# Patient Record
Sex: Male | Born: 1970 | Race: Black or African American | Hispanic: No | Marital: Single | Smoking: Current every day smoker
Health system: Southern US, Community
[De-identification: ages and names within clinical notes are randomized; demographics above are authoritative.]

## PROBLEM LIST (undated history)

## (undated) DIAGNOSIS — T8781 Dehiscence of amputation stump: Secondary | ICD-10-CM

## (undated) DIAGNOSIS — Z87820 Personal history of traumatic brain injury: Secondary | ICD-10-CM

## (undated) DIAGNOSIS — T8130XA Disruption of wound, unspecified, initial encounter: Secondary | ICD-10-CM

## (undated) DIAGNOSIS — Z9289 Personal history of other medical treatment: Secondary | ICD-10-CM

## (undated) DIAGNOSIS — S14105A Unspecified injury at C5 level of cervical spinal cord, initial encounter: Secondary | ICD-10-CM

## (undated) DIAGNOSIS — R569 Unspecified convulsions: Secondary | ICD-10-CM

## (undated) DIAGNOSIS — K592 Neurogenic bowel, not elsewhere classified: Secondary | ICD-10-CM

## (undated) DIAGNOSIS — G8250 Quadriplegia, unspecified: Secondary | ICD-10-CM

## (undated) DIAGNOSIS — Z720 Tobacco use: Secondary | ICD-10-CM

## (undated) DIAGNOSIS — Z789 Other specified health status: Secondary | ICD-10-CM

## (undated) DIAGNOSIS — G40802 Other epilepsy, not intractable, without status epilepticus: Principal | ICD-10-CM

## (undated) DIAGNOSIS — N319 Neuromuscular dysfunction of bladder, unspecified: Secondary | ICD-10-CM

## (undated) HISTORY — PX: BLADDER SURGERY: SHX569

## (undated) HISTORY — PX: OTHER SURGICAL HISTORY: SHX169

## (undated) HISTORY — PX: SPINE SURGERY: SHX786

## (undated) HISTORY — DX: Quadriplegia, unspecified: G82.50

## (undated) HISTORY — DX: Other epilepsy, not intractable, without status epilepticus: G40.802

## (undated) HISTORY — PX: INCISION AND DRAINAGE PERIRECTAL ABSCESS: SHX1804

---

## 1997-10-22 DIAGNOSIS — G40009 Localization-related (focal) (partial) idiopathic epilepsy and epileptic syndromes with seizures of localized onset, not intractable, without status epilepticus: Secondary | ICD-10-CM

## 1997-10-22 DIAGNOSIS — S14109A Unspecified injury at unspecified level of cervical spinal cord, initial encounter: Secondary | ICD-10-CM | POA: Insufficient documentation

## 1998-09-21 ENCOUNTER — Inpatient Hospital Stay (HOSPITAL_COMMUNITY): Admission: EM | Admit: 1998-09-21 | Discharge: 1998-10-06 | Payer: Self-pay | Admitting: Emergency Medicine

## 1998-09-21 ENCOUNTER — Encounter: Payer: Self-pay | Admitting: Neurosurgery

## 1998-09-22 ENCOUNTER — Encounter: Payer: Self-pay | Admitting: Neurosurgery

## 1998-09-22 ENCOUNTER — Encounter: Payer: Self-pay | Admitting: General Surgery

## 1998-09-23 ENCOUNTER — Encounter: Payer: Self-pay | Admitting: Neurosurgery

## 1998-09-24 ENCOUNTER — Encounter: Payer: Self-pay | Admitting: Neurosurgery

## 1998-09-24 ENCOUNTER — Encounter: Payer: Self-pay | Admitting: Critical Care Medicine

## 1998-09-25 ENCOUNTER — Encounter: Payer: Self-pay | Admitting: Critical Care Medicine

## 1998-09-26 ENCOUNTER — Encounter: Payer: Self-pay | Admitting: Neurosurgery

## 1998-09-27 ENCOUNTER — Encounter: Payer: Self-pay | Admitting: Neurosurgery

## 1998-09-28 ENCOUNTER — Encounter: Payer: Self-pay | Admitting: Pulmonary Disease

## 1998-09-29 ENCOUNTER — Encounter: Payer: Self-pay | Admitting: Critical Care Medicine

## 1998-09-30 ENCOUNTER — Encounter: Payer: Self-pay | Admitting: Critical Care Medicine

## 1998-10-01 ENCOUNTER — Encounter: Payer: Self-pay | Admitting: Critical Care Medicine

## 1998-10-02 ENCOUNTER — Encounter: Payer: Self-pay | Admitting: Critical Care Medicine

## 1998-10-03 ENCOUNTER — Encounter: Payer: Self-pay | Admitting: Neurosurgery

## 1998-10-06 ENCOUNTER — Inpatient Hospital Stay (HOSPITAL_COMMUNITY)
Admission: RE | Admit: 1998-10-06 | Discharge: 1998-11-14 | Payer: Self-pay | Admitting: Physical Medicine and Rehabilitation

## 1998-11-07 ENCOUNTER — Encounter: Payer: Self-pay | Admitting: Physical Medicine and Rehabilitation

## 1998-11-23 ENCOUNTER — Encounter
Admission: RE | Admit: 1998-11-23 | Discharge: 1999-02-21 | Payer: Self-pay | Admitting: Physical Medicine and Rehabilitation

## 1999-08-09 ENCOUNTER — Encounter: Admission: RE | Admit: 1999-08-09 | Discharge: 1999-09-22 | Payer: Self-pay | Admitting: Neurosurgery

## 1999-10-04 ENCOUNTER — Encounter: Admission: RE | Admit: 1999-10-04 | Discharge: 2000-01-02 | Payer: Self-pay | Admitting: Neurosurgery

## 2000-02-29 ENCOUNTER — Encounter: Admission: RE | Admit: 2000-02-29 | Discharge: 2000-05-29 | Payer: Self-pay | Admitting: Neurosurgery

## 2000-06-27 ENCOUNTER — Encounter: Admission: RE | Admit: 2000-06-27 | Discharge: 2000-09-25 | Payer: Self-pay | Admitting: Neurosurgery

## 2000-09-26 ENCOUNTER — Encounter: Admission: RE | Admit: 2000-09-26 | Discharge: 2000-12-25 | Payer: Self-pay | Admitting: Neurosurgery

## 2001-01-17 ENCOUNTER — Encounter: Admission: RE | Admit: 2001-01-17 | Discharge: 2001-04-17 | Payer: Self-pay | Admitting: Neurosurgery

## 2001-02-07 ENCOUNTER — Encounter: Payer: Self-pay | Admitting: Emergency Medicine

## 2001-02-07 ENCOUNTER — Emergency Department (HOSPITAL_COMMUNITY): Admission: EM | Admit: 2001-02-07 | Discharge: 2001-02-07 | Payer: Self-pay | Admitting: Emergency Medicine

## 2001-03-06 ENCOUNTER — Emergency Department (HOSPITAL_COMMUNITY): Admission: EM | Admit: 2001-03-06 | Discharge: 2001-03-06 | Payer: Self-pay | Admitting: Emergency Medicine

## 2001-03-06 ENCOUNTER — Encounter: Payer: Self-pay | Admitting: Emergency Medicine

## 2001-04-14 ENCOUNTER — Ambulatory Visit (HOSPITAL_COMMUNITY): Admission: RE | Admit: 2001-04-14 | Discharge: 2001-04-14 | Payer: Self-pay | Admitting: Urology

## 2001-04-18 ENCOUNTER — Encounter: Admission: RE | Admit: 2001-04-18 | Discharge: 2001-07-17 | Payer: Self-pay | Admitting: Neurosurgery

## 2001-08-08 ENCOUNTER — Encounter: Admission: RE | Admit: 2001-08-08 | Discharge: 2001-11-06 | Payer: Self-pay | Admitting: Neurosurgery

## 2001-10-22 HISTORY — PX: CHOLECYSTECTOMY: SHX55

## 2001-11-07 ENCOUNTER — Encounter: Admission: RE | Admit: 2001-11-07 | Discharge: 2002-01-15 | Payer: Self-pay | Admitting: Neurosurgery

## 2002-01-16 ENCOUNTER — Encounter: Admission: RE | Admit: 2002-01-16 | Discharge: 2002-01-27 | Payer: Self-pay | Admitting: Neurosurgery

## 2002-03-03 ENCOUNTER — Encounter
Admission: RE | Admit: 2002-03-03 | Discharge: 2002-03-30 | Payer: Self-pay | Admitting: Physical Medicine & Rehabilitation

## 2003-04-14 ENCOUNTER — Encounter
Admission: RE | Admit: 2003-04-14 | Discharge: 2003-07-13 | Payer: Self-pay | Admitting: Physical Medicine & Rehabilitation

## 2003-07-23 ENCOUNTER — Encounter
Admission: RE | Admit: 2003-07-23 | Discharge: 2003-10-21 | Payer: Self-pay | Admitting: Physical Medicine & Rehabilitation

## 2004-01-24 ENCOUNTER — Encounter
Admission: RE | Admit: 2004-01-24 | Discharge: 2004-04-23 | Payer: Self-pay | Admitting: Physical Medicine & Rehabilitation

## 2004-09-20 ENCOUNTER — Encounter
Admission: RE | Admit: 2004-09-20 | Discharge: 2004-12-19 | Payer: Self-pay | Admitting: Physical Medicine & Rehabilitation

## 2004-09-21 ENCOUNTER — Ambulatory Visit: Payer: Self-pay | Admitting: Physical Medicine & Rehabilitation

## 2005-02-26 ENCOUNTER — Encounter
Admission: RE | Admit: 2005-02-26 | Discharge: 2005-05-27 | Payer: Self-pay | Admitting: Physical Medicine & Rehabilitation

## 2005-02-28 ENCOUNTER — Ambulatory Visit: Payer: Self-pay | Admitting: Physical Medicine & Rehabilitation

## 2005-08-23 ENCOUNTER — Encounter
Admission: RE | Admit: 2005-08-23 | Discharge: 2005-11-21 | Payer: Self-pay | Admitting: Physical Medicine & Rehabilitation

## 2005-08-23 ENCOUNTER — Ambulatory Visit: Payer: Self-pay | Admitting: Physical Medicine & Rehabilitation

## 2006-01-03 ENCOUNTER — Ambulatory Visit: Payer: Self-pay | Admitting: Physical Medicine & Rehabilitation

## 2006-01-03 ENCOUNTER — Encounter
Admission: RE | Admit: 2006-01-03 | Discharge: 2006-04-03 | Payer: Self-pay | Admitting: Physical Medicine & Rehabilitation

## 2006-02-20 ENCOUNTER — Encounter: Payer: Self-pay | Admitting: Emergency Medicine

## 2006-06-27 ENCOUNTER — Encounter
Admission: RE | Admit: 2006-06-27 | Discharge: 2006-09-25 | Payer: Self-pay | Admitting: Physical Medicine & Rehabilitation

## 2006-06-27 ENCOUNTER — Ambulatory Visit: Payer: Self-pay | Admitting: Physical Medicine & Rehabilitation

## 2006-08-07 ENCOUNTER — Ambulatory Visit: Payer: Self-pay | Admitting: Physical Medicine & Rehabilitation

## 2006-09-30 ENCOUNTER — Ambulatory Visit: Payer: Self-pay | Admitting: Physical Medicine & Rehabilitation

## 2006-09-30 ENCOUNTER — Encounter
Admission: RE | Admit: 2006-09-30 | Discharge: 2006-12-29 | Payer: Self-pay | Admitting: Physical Medicine & Rehabilitation

## 2006-12-24 ENCOUNTER — Encounter
Admission: RE | Admit: 2006-12-24 | Discharge: 2007-03-24 | Payer: Self-pay | Admitting: Physical Medicine & Rehabilitation

## 2006-12-24 ENCOUNTER — Ambulatory Visit: Payer: Self-pay | Admitting: Physical Medicine & Rehabilitation

## 2008-10-01 ENCOUNTER — Encounter
Admission: RE | Admit: 2008-10-01 | Discharge: 2008-10-04 | Payer: Self-pay | Admitting: Physical Medicine & Rehabilitation

## 2008-10-04 ENCOUNTER — Ambulatory Visit: Payer: Self-pay | Admitting: Physical Medicine & Rehabilitation

## 2009-02-03 ENCOUNTER — Encounter
Admission: RE | Admit: 2009-02-03 | Discharge: 2009-02-04 | Payer: Self-pay | Admitting: Physical Medicine & Rehabilitation

## 2009-02-04 ENCOUNTER — Ambulatory Visit: Payer: Self-pay | Admitting: Physical Medicine & Rehabilitation

## 2009-07-01 ENCOUNTER — Encounter
Admission: RE | Admit: 2009-07-01 | Discharge: 2009-09-29 | Payer: Self-pay | Admitting: Physical Medicine & Rehabilitation

## 2009-08-18 ENCOUNTER — Ambulatory Visit: Payer: Self-pay | Admitting: Physical Medicine & Rehabilitation

## 2010-01-20 ENCOUNTER — Encounter
Admission: RE | Admit: 2010-01-20 | Discharge: 2010-04-20 | Payer: Self-pay | Admitting: Physical Medicine & Rehabilitation

## 2010-02-23 ENCOUNTER — Ambulatory Visit: Payer: Self-pay | Admitting: Physical Medicine & Rehabilitation

## 2010-03-25 ENCOUNTER — Inpatient Hospital Stay (HOSPITAL_COMMUNITY): Admission: EM | Admit: 2010-03-25 | Discharge: 2010-03-26 | Payer: Self-pay | Admitting: Emergency Medicine

## 2010-04-05 ENCOUNTER — Encounter
Admission: RE | Admit: 2010-04-05 | Discharge: 2010-04-11 | Payer: Self-pay | Admitting: Physical Medicine & Rehabilitation

## 2010-04-11 ENCOUNTER — Ambulatory Visit: Payer: Self-pay | Admitting: Physical Medicine & Rehabilitation

## 2010-08-16 ENCOUNTER — Encounter
Admission: RE | Admit: 2010-08-16 | Discharge: 2010-11-14 | Payer: Self-pay | Source: Home / Self Care | Attending: Physical Medicine & Rehabilitation | Admitting: Physical Medicine & Rehabilitation

## 2010-09-19 ENCOUNTER — Ambulatory Visit: Payer: Self-pay | Admitting: Physical Medicine & Rehabilitation

## 2011-01-08 LAB — BASIC METABOLIC PANEL
BUN: 14 mg/dL (ref 6–23)
CO2: 27 mEq/L (ref 19–32)
Calcium: 9.1 mg/dL (ref 8.4–10.5)
Chloride: 106 mEq/L (ref 96–112)
Creatinine, Ser: 0.9 mg/dL (ref 0.4–1.5)
GFR calc Af Amer: >60 mL/min (ref >60)
GFR calc non Af Amer: >60 mL/min (ref >60)
Glucose, Bld: 85 mg/dL (ref 70–99)
Potassium: 4.1 mEq/L (ref 3.5–5.1)
Sodium: 137 mEq/L (ref 135–145)

## 2011-01-08 LAB — CBC
HCT: 47.8 % (ref 39.0–52.0)
Hemoglobin: 16.9 g/dL (ref 13.0–17.0)
MCHC: 35.2 g/dL (ref 30.0–36.0)
MCV: 93.9 fL (ref 78.0–100.0)
Platelets: 152 10*3/uL (ref 150–400)
RBC: 5.09 MIL/uL (ref 4.22–5.81)
RDW: 13.4 % (ref 11.5–15.5)
WBC: 15.4 10*3/uL — ABNORMAL HIGH (ref 4.0–10.5)

## 2011-01-08 LAB — CULTURE, BLOOD (ROUTINE X 2)
Culture: NO GROWTH
Culture: NO GROWTH

## 2011-01-08 LAB — URINALYSIS, ROUTINE W REFLEX MICROSCOPIC
Bilirubin Urine: NEGATIVE
Glucose, UA: NEGATIVE mg/dL
Hgb urine dipstick: NEGATIVE
Ketones, ur: NEGATIVE mg/dL
Nitrite: POSITIVE — AB
Protein, ur: NEGATIVE mg/dL
Specific Gravity, Urine: 1.019 (ref 1.005–1.030)
Urobilinogen, UA: 1 mg/dL (ref 0.0–1.0)
pH: 6.5 (ref 5.0–8.0)

## 2011-01-08 LAB — URINE MICROSCOPIC-ADD ON

## 2011-01-08 LAB — URINE CULTURE: Colony Count: >=100000

## 2011-01-08 LAB — DIFFERENTIAL
Basophils Absolute: 0 10*3/uL (ref 0.0–0.1)
Basophils Relative: 0 % (ref 0–1)
Eosinophils Absolute: 0 10*3/uL (ref 0.0–0.7)
Eosinophils Relative: 0 % (ref 0–5)
Lymphocytes Relative: 5 % — ABNORMAL LOW (ref 12–46)
Lymphs Abs: 0.7 10*3/uL (ref 0.7–4.0)
Monocytes Absolute: 1.2 10*3/uL — ABNORMAL HIGH (ref 0.1–1.0)
Monocytes Relative: 8 % (ref 3–12)
Neutro Abs: 13.4 10*3/uL — ABNORMAL HIGH (ref 1.7–7.7)
Neutrophils Relative %: 87 % — ABNORMAL HIGH (ref 43–77)

## 2011-01-08 LAB — VALPROIC ACID LEVEL
Valproic Acid Lvl: 57.7 ug/mL (ref 50.0–100.0)
Valproic Acid Lvl: 59.9 ug/mL (ref 50.0–100.0)

## 2011-02-12 ENCOUNTER — Emergency Department (HOSPITAL_COMMUNITY)
Admission: EM | Admit: 2011-02-12 | Discharge: 2011-02-12 | Disposition: A | Discharge status: Home / Self Care | Payer: Worker's Compensation | Attending: Emergency Medicine | Admitting: Emergency Medicine

## 2011-02-12 ENCOUNTER — Emergency Department (HOSPITAL_COMMUNITY): Payer: Worker's Compensation

## 2011-02-12 DIAGNOSIS — R221 Localized swelling, mass and lump, neck: Secondary | ICD-10-CM | POA: Insufficient documentation

## 2011-02-12 DIAGNOSIS — M436 Torticollis: Secondary | ICD-10-CM | POA: Insufficient documentation

## 2011-02-12 DIAGNOSIS — R569 Unspecified convulsions: Secondary | ICD-10-CM | POA: Insufficient documentation

## 2011-02-12 DIAGNOSIS — R220 Localized swelling, mass and lump, head: Secondary | ICD-10-CM | POA: Insufficient documentation

## 2011-02-20 ENCOUNTER — Ambulatory Visit: Payer: PRIVATE HEALTH INSURANCE | Admitting: Physical Medicine & Rehabilitation

## 2011-03-02 ENCOUNTER — Encounter: Payer: Medicare Other | Attending: Physical Medicine & Rehabilitation

## 2011-03-02 ENCOUNTER — Ambulatory Visit: Payer: Worker's Compensation | Admitting: Physical Medicine & Rehabilitation

## 2011-03-02 DIAGNOSIS — K592 Neurogenic bowel, not elsewhere classified: Secondary | ICD-10-CM | POA: Insufficient documentation

## 2011-03-02 DIAGNOSIS — R259 Unspecified abnormal involuntary movements: Secondary | ICD-10-CM | POA: Insufficient documentation

## 2011-03-02 DIAGNOSIS — N319 Neuromuscular dysfunction of bladder, unspecified: Secondary | ICD-10-CM | POA: Insufficient documentation

## 2011-03-02 DIAGNOSIS — W19XXXS Unspecified fall, sequela: Secondary | ICD-10-CM | POA: Insufficient documentation

## 2011-03-02 DIAGNOSIS — IMO0002 Reserved for concepts with insufficient information to code with codable children: Secondary | ICD-10-CM | POA: Insufficient documentation

## 2011-03-02 DIAGNOSIS — G8252 Quadriplegia, C1-C4 incomplete: Secondary | ICD-10-CM | POA: Insufficient documentation

## 2011-03-02 NOTE — Assessment & Plan Note (Signed)
REASON FOR VISIT:  Quadriplegia.  A 40 year old male with a fall resulting in incomplete tetraplegia confined to manual wheelchair, nonambulatory since the injury.  He has been following with me since August 18, 2009.  He is doing intermittent catheterization.  He has had occasional urinary tract infection.  He continues to have spasticity, but he can reduce it by crossing his legs. He has had no skin areas.  He has a past medical history significant for seizure disorder.  He has seen neurology in the past.  He has no new problems other than some social issues.  He states that he is having issues with his home environment and would like to move to a handicap accessible apartment.  From equipment standpoint, he needs a new backrest, has not been able to get this performed by durable medical equipment provider at this point.  PHYSICAL EXAMINATION:  Motor strength is absent in lower extremities. He is 5/5 bilateral deltoid, 4/5 in the biceps, 3- at the grip, and 2- at the finger extensors.  Right is worse than left in terms of range of motion.  He has severe spasticity in the lower extremities with clonus and extensor spasms.  IMPRESSION: 1. 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  He is     using digistem for his bowel and intermittent catheter to his     bladder. 2. Spasticity.  He really does not wish to be on any type of     medication for this.  PLAN: 1. We will write for new backrest for his wheelchair. 2. I will see him back in 6 months. 3. Follow up with Urology.     Erick Colace, M.D. Electronically Signed    AEK/MedQ D:  03/02/2011 09:28:06  T:  03/02/2011 20:36:25  Job #:  [id number]  cc:   Loraine Leriche C. Vernie Ammons, M.D. Fax: [phone]

## 2011-03-06 NOTE — Assessment & Plan Note (Signed)
[name] returns today.  He has been seeing Dr. Lamar Benes for last  several years for his spinal cord injury.  He gave the history of  falling sometime around in 1999, resulting incomplete tetraplegia.  He  has been nonambulatory since his injury and has been confined to a  manual wheelchair.  Fortunately, his upper extremities has had some  recovery and he is able to cath himself as well as do his upper body  dressing as well as his transfers.  He is driving.   He states that overall his urinary tract infection incidence has reduced  since he has been doing some intermittent catheterization about once a  week.  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  He  sees Dr. Vernie Ammons.  Looking back of his office notes from 2002, he has  had a UroLume stent placement per Dr. Vernie Ammons at that time.  This was  placed for retention.  He has been using a condom catheter.   MEDICATIONS:  Depakote 500 p.o. b.i.d.   REVIEW OF SYSTEMS:  Has intermittent leg spasms when he transfers his  leg going to an extension.   PHYSICAL EXAMINATION:  GENERAL:  No acute distress.  He is a wheelchair,  young male.  EXTREMITIES:  He has an intrinsic atrophy, right upper extremity.  He  has sensation that is reduced mildly in the lower extremities.  His  motor strength is 2 in the right C7-C8 dermatomes and 4 bilateral C6  myotome and 5 in the C5 dermatome, lower extremity strength is 0.  His  tone is increased.  Ashworth grade 3.  He does have clonus bilateral  knees and ankles. Upper extremity tone is normal.   IMPRESSION:  Incomplete spinal cord injury with neurogenic bowel and  bladder.   PLAN:  1. I have recommended that he does intermittent cath once a day rather      months or week.  This would ensure that his bladder empties      adequately at least once a day.  He reportedly has had a followup      renal ultrasound with his urologist, I do not have any records of      this, but he  reports that they do all of this on a regular basis.  2. I discussed signs, symptoms of autonomic dysreflexia.  3. Right hip wound.  He says it is really just down to a very      superficial area.  We will ask him to start using some zinc oxide      on it.  No granulating tissue.  4. Equipment, does not need new at this point.  5. Spasticity, we discussed treatment options including Botox, nerve      blocks, and medicines like tizanidine he would really like to hold      off on this at the current time.   I will see him back in 5 months.      [name], M.D.  Electronically Signed     AEK/MedQ  D:  02/04/2009 16:31:31  T:  02/05/2009 07:35:06  Job #:  [id number]   cc:   Loraine Leriche C. Vernie Ammons, M.D.  Fax: [phone]

## 2011-03-06 NOTE — Assessment & Plan Note (Signed)
Mr. Mode returns to clinic today for followup evaluation.  He reports  that he is doing fairly well overall.  He is having problems with his  handicapped accessible Zenaida Niece that was purchased approximately in the year  2000, but the lift is not working well and he has difficulty trying to  get the lift up on his own related to his spinal cord injury.   The patient reports that he does do in and out caths once a month to  empty his bladder completely.  Otherwise, he urinates using a Quantum  catheter.  He does have problems with his bowels, and he has had  problems finding appropriate suppositories.  The only ones that he has  found recently have not worked and he has resorted to using oral  laxatives, which are difficult to time.   The patient reports that he does have occasional problems with sleeping  but overall, he is doing fairly well.   MEDICATIONS:  Depakote 500 mg b.i.d.   REVIEW OF SYSTEMS:  Positive for night sweats.   PHYSICAL EXAMINATION:  Well-appearing, fit adult male seated in a manual  wheelchair, in no acute discomfort.  The patient reports that he has  limitations in walking and is wheelchair bound.  He reports that he can  sit, travel, and perform his own personal care on a regular basis.  He  has no skin breakdown at this time.   IMPRESSION:  1. Incomplete spinal cord injury secondary to C4-C6 fracture.  2. Neurogenic bowel and bladder.  3. Seizure disorder, on Depakote.  4. Left buttock decubitus, healed.   In the office today, we did tell the patient that we will be getting a  note off to [employer] at [phone] that they can hopefully get him an  appropriate wheelchair accessible Zenaida Niece with an automatic lift.  The one  that he has at present is not functioning well for him and he has a lot  of miles on the vehicle.  In the meantime, we have given him a  prescription for Magic Bullet Suppository that hopefully will help him  with his bowel problems.  We will plan on  seeing the patient in followup  in this office in approximately 6 months' time with refills prior to  that appointment as necessary.           ______________________________  Ellwood Dense, M.D.     DC/MedQ  D:  10/04/2008 10:41:57  T:  10/05/2008 03:35:05  Job #:  [id number]

## 2011-03-09 NOTE — Assessment & Plan Note (Signed)
[name] returns to clinic today for follow-up evaluation.  He reports  that he is doing well overall.  He continues to take his Depakote 500 mg  twice a day without any recurrent seizure activity.   In terms of his bowels, he uses a Dulcolax suppository monthly.  He has good  bowel movements in between.  He wonders if he could slightly increase his  use of suppository up from once a month.   In terms of his bladder, he is using an indwelling Foley catheter with a  catheter bag at the present time.   MEDICATIONS:  Depakote 500 mg p.o. b.i.d.   REVIEW OF SYSTEMS:  Noncontributory.   PHYSICAL EXAMINATION:  GENERAL:  A well-appearing, fit adult male seated in  a manual wheelchair.  VITAL SIGNS:  Blood pressure 105/54 with pulse 81, respiratory rate 16 and  O2 saturation 98% on room air.  MUSCULOSKELETAL/NEUROLOGIC:  He has 4+/5 strength throughout the bilateral  upper extremities and 0/5 strength throughout the bilateral lower  extremities.   IMPRESSION:  1.  Incomplete spinal cord injury up to C4-6 fracture.  2.  Neurogenic bowel and bladder secondary to incomplete spinal cord injury.  3.  Seizure disorder, on Depakote.  4.  Left foot callus formation.   In the office today, no refill on medication is necessary.  We have told him  that he certainly can increase use of his Dulcolax suppository up to two or  three times per month or even on a weekly basis.  He reports that he has  good bowel movements in between without the use of suppository but tends to  prefer to use that at least once a month.  That has been approved in the  office today.  Will plan to see the patient in follow-up in this office in  approximately six months' time.           ______________________________  Ellwood Dense, M.D.     DC/MedQ  D:  01/07/2006 10:01:59  T:  01/08/2006 06:47:09  Job #:  [id number]

## 2011-03-09 NOTE — Assessment & Plan Note (Signed)
[name] returns to clinic today for follow-up evaluation.  He is a middle  aged adult male with history of an incomplete spinal cord injury occurring  at the C4-6 level.  The patient continues to be followed by Dr. Thad Ranger for  seizure prophylaxis.  He remains on Depakote 500 mg t.i.d. at present.   The patient did have some questions regarding new advancement in spinal cord  treatment.  They apparently have been talking about new issues on TV and he  is wondering about transplants or stem cell research.  There is ongoing stem  cell research that is apparently being done in rodents but nothing in any  higher level mammal.  There is no significant research ongoing with  transplants at the present time that I am aware of.  We did go over this  information in some detail.   The patient reports that he continues to live at a handicapped wheelchair  accessible apartment but is looking into a house with his girlfriend.  He  continues to work with dumbbells and free weights on a periodic basis.  He  is having no problems with his equipment at the present time.   MEDICATION:  Depakote 500 mg t.i.d.   PHYSICAL EXAMINATION:  GENERAL APPEARANCE:  A well-appearing middle aged  adult male seated in a manual wheelchair.  VITAL SIGNS:  Blood pressure 127/57 with pulse if 87 and O2 saturation 99%  on room air.  NEUROLOGIC:  He has strength of 4+/5 in his bilateral upper extremities with  absent motor below the mid cervical region.   IMPRESSION:  1. Incomplete spinal cord injury after C4-6 fracture (806.04)  2. 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  3. Seizure disorder on Depakote (780.39).   At the present time, the patient reports that he needs no refill of  medications.  He has noticed a change in his bowel program as he tries to  eat more healthy.  He had been eating a lot of [employer] but has tried to  substitute high fiber cereals into his diet along with fruit on  a regular  basis.  That has changed his bowel program slightly, although he continues  to have a bowel movement approximately every day to every other day.   I will plan on seeing the patient in follow-up in approximately four to six  months' time.      [name], M.D.   DC/MedQ  D:  10/04/2003 11:10:44  T:  10/04/2003 11:57:06  Job #:  [id number]

## 2011-03-09 NOTE — Assessment & Plan Note (Signed)
Tuesday, October 02, 2006   Mr. Fleischer returns to the clinic today for followup evaluation.  He has  skin breakdown of his left buttock that he would like to have evaluated.  He also needs an order for some DuoDerm and some hydrogel-type solution  that can be used under the DuoDerm to help with healing.  He has  experienced this type of wound in the past and it has healed well for  him in the past using only DuoDerm.   MEDICATIONS:  1. Depakote 500 mg b.i.d.   REVIEW OF SYSTEMS:  Positive for skin breakdown of his left buttock  only.   PHYSICAL EXAM:  Well-appearing fit adult male seated in a manual  wheelchair in no acute discomfort.  Blood pressure 127/69, pulse 79, respiratory rate 16, and O2 saturation  98% on room air.  Examination of his left buttock shows a 2 cm by 5 mm area, which is  denuded and stage I.  He has no drainage from that wound.  This is  present over his medial left buttock region.   IMPRESSION:  1. Incomplete spinal cord injury secondary to C4-6 fracture with      neurogenic bowel and bladder secondary to incomplete spinal cord      injury.  2. Seizure disorder on Depakote.  3. Left buttock decubitus.   At the present time, we have ordered some DuoDerm along with some  Coloplast that will be phoned in to his supply company.  We will plan on  seeing the patient in followup as previously scheduled in March of 2008.           ______________________________  Ellwood Dense, M.D.     DC/MedQ  D:  10/02/2006 14:46:01  T:  10/02/2006 17:16:43  Job #:  [id number]

## 2011-03-09 NOTE — Assessment & Plan Note (Signed)
Mr. Orrego returns to clinic today for followup evaluation.  He reports that  he has been doing well overall.  He continues to use Depakote 500 mg twice a  day for his seizure disorder, followed by Dr. Thad Ranger.  Followup was  approximately three weeks ago.  He has experienced no new seizure activity.   The patient is having no problems with his bowels at the present time.  He  continues to be very regular on a daily basis.   The patient also reports no problems with his bladder.  He uses a condom  catheter after having prior surgery with Dr. Vernie Ammons.   The patient reports no new hospitalizations and no problems with his  equipment at the present time.   MEDICATIONS:  Depakote 500 mg b.i.d.   PHYSICAL EXAMINATION:  Well-appearing, adult male seated in a manual  wheelchair.  He is well-nourished in the office today.  Blood pressure was  126/75 with a pulse of 86, respiratory rate 20, and O2 saturation 98% on  room air.  He has 4+/5 strength throughout the bilateral upper extremities  with absent motor strength from the mid thoracic region down.   IMPRESSION:  1.  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  2.  Neurogenic bowel and bladder secondary to incomplete spinal cord injury      (596.54/564.8).  3.  Seizure disorder on Depakote (780.39).   In the office today, no new problems have developed over the past six  months. Will continue to monitor him approximately every six months for any  problems related to his spinal cord injury.  He follows with Dr. Thad Ranger  for his seizure disorder.  He will contact us if any problems pop up.       DC/MedQ  D:  09/21/2004 11:24:19  T:  09/21/2004 12:00:14  Job #:  [id number]   cc:   Casimiro Needle L. Thad Ranger, M.D.  [address]  [address]  [place]  [place] [address]  Fax: [phone]

## 2011-03-09 NOTE — Op Note (Signed)
[facility]  Patient:    [name], [name]                MRN: [id number] Proc. Date: 04/14/01 Adm. Date:  [id number] Attending:  Trisha Mangle                           Operative Report  PREOPERATIVE DIAGNOSES:  Neurogenic bladder and urinary retention.  POSTOPERATIVE DIAGNOSES:  Neurogenic bladder and urinary retention.  PROCEDURE:  Cystoscopy and urolume stent placement.  SURGEON:  Dr. Vernie Ammons.  ANESTHESIA:  General.  DRAINS:  None. Urolume stent length used 2.5 cm.  COMPLICATIONS:  None.  ESTIMATED BLOOD LOSS:  10 cc.  INDICATIONS FOR PROCEDURE:  The patient is a 40 year old black male paraplegic who has a neurogenic bladder. He has been studied and has borderline bladder pressures managed with anticholinergics. He recently has gone into urinary retention found to have very large residuals and required Foley catheter drainage. We discussed intermittent self catheterization but he does not have the manual dexterity in order to perform this. We have also discussed sphincterotomy versus urolume stent placement. He has elected to proceed with the urolume stent at this time and he understands the risks, complications, alternatives, limitations, and anticipated outcome of the surgery.  DESCRIPTION OF PROCEDURE:  After informed consent, the patient was brought to the major OR and placed on the table and administered general anesthesia and then moved to the dorsal lithotomy position at which time his genitalia was sterilely prepped and draped after his Foley catheter was removed. I initially performed cystoscopy and found the urethra entirely normal down the sphincter which appeared intact. The prostatic urethra was nonobstructing, short and without lesion. The bladder itself was free of any tumor, stones or inflammatory lesions other than some mild catheter irritation.  I then measured the prostate using the urolume measuring tool  inflating the balloon within the bladder and noting the prostatic urethra measured approximately 1.5 cm in length. The sphincter was noted just beyond the veru. I initially chose a 1.5 cm urolume stent and deployed this in adequate position across the sphincter but the device upon its removal dislodged and had to be extracted with the alligator forceps. I therefore chose a slightly longer 2.5 cm urolume stent and was able to place this at the level of the veru and on back across the sphincter without difficulty. I then drained his bladder and he was awakened and taken to the recovery room in stable, satisfactory condition. Because he had a catheter in preop, I will continue him on Cipro 500 mg for three more days and have him return to the office in approximately two weeks for recheck. DD:  04/14/01 TD:  04/14/01 Job: [id number]

## 2011-03-09 NOTE — Assessment & Plan Note (Signed)
[name] returns to the clinic today for follow-up evaluation.  He has  developed a callous over the right mid posterior forearm.  There is no sign  of infection.  There has been no drainage at that site.  It has improved  with rubbing alcohol, and generally, there is no problem other than pressure  from his wheelchair side arm.   In terms of his bowels, he reports regular bowel movements with only  occasional loose bowels after he has eaten a fatty meal.   In terms of his bladder, he does have a bladder balloon in place, that  allows him to urinate daily without problems.  He wonders about possibly  doing intermittent catheterizations prior to intercourse to avoid urination.   MEDICATIONS:  Depakote 500 mg b.i.d.   REVIEW OF SYSTEMS:  Positive for skin lesion, right forearm.   PHYSICAL EXAMINATION:  GENERAL:  A well-appearing, fit adult male seated in  a manual wheelchair in no acute discomfort.  VITAL SIGNS:  Blood pressure 117/60 with a pulse of 84, respiratory rate 18,  O2 saturation 96% on room air.  MUSCULOSKELETAL:  He has +4/5 strength in the bilateral upper extremities  and 0/5 strength in the bilateral lower extremities.   IMPRESSION:  1. Incomplete spinal cord injury secondary to C4-6 fracture.  2. Neurogenic bowel and bladder secondary to incomplete spinal cord      injury.  3. Seizure disorder, on Depakote.  4. Right forearm callus.   In the office today, no refill on medication is necessary.  He will be  contacting Dr. Thad Ranger for refills on his Depakote.  No care needs to be  provided for his right forearm callus, that is present only because of  pressure applied to the forearm using a wheelchair.  He can certainly  perform in and out catheterizations prior to intercourse, but he otherwise  needs to do no in and out catheterizations, as he is voiding on his own.  He  has a friend who does not have a balloon in his bladder and that individual  does need to cath  regularly.  The patient is in a completely different  situation, and he understands that at this time.           ______________________________  [name], M.D.     DC/MedQ  D:  06/28/2006 13:17:59  T:  06/28/2006 18:48:10  Job #:  [id number]

## 2011-03-09 NOTE — Assessment & Plan Note (Signed)
MEDICAL RECORD NUMBER:  [id number].   Mr. Minkin returns to clinic today for followup evaluation.  He actually had  not been scheduled for six months since last appointment on January 26, 2004.  For some reason, he was called and told to come into the office today.  He  reports that he has had no problems recently.  He has not been using a  standing brace and notes that he has somewhat increased weakness of the left  leg secondary to that lack of use.  He does report some sensation during  bowel movements or just prior to bowel movements, but still needs to  stimulate himself to adequately empty.  He continues to use his Depakote 500  mg t.i.d. prescribed by Dr. Thad Ranger.   There is some paperwork for his condom catheters and leg bag, which I have  completed for him.  He will be faxing this into the manufacturer and  supplier.   MEDICATIONS:  Depakote 500 mg b.i.d.   PHYSICAL EXAMINATION:  A well-appearing, well-nourished, adult male.  Blood  pressure 92/51, pulse 71, respiratory rate 18, and O2 saturation 97% on room  air.  He is seated in a manual wheelchair at the present time.  Strength was  4+/5 throughout the bilateral upper extremities with absent motor activity  at the low mid cervical region.   IMPRESSION:  1. 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  2. Neurogenic bowel and bladder secondary to incomplete spinal cord injury     (596.54/564.8).  3. Seizure disorder on Depakote (780.39).   At the present time, the patient is doing well.  I have given him a  prescription for the condom catheter with leg bag to be sent into his  supplier.  Will plan on seeing him at followup in this office in  approximately six months' time.      Ellwood Dense, M.D.   DC/MedQ  D:  04/05/2004 13:07:43  T:  04/05/2004 14:19:06  Job #:  [id number]

## 2011-03-09 NOTE — Assessment & Plan Note (Signed)
Mr. Linnemann returns to the clinic today for follow-up evaluation a few days  ahead of schedule.  His mother is concerned with some discoloration of skin  on the lateral aspect of his left foot.  He was scheduled for a follow-up in  approximately 3 days, but came in a few days early.   The patient reports that when he is driving, he tucks his left leg  underneath his other leg so that he does not experience spasms that would  interfere with his driving.  He tends to put a lot of pressure on the  lateral aspect of his left foot during driving and also whenever he is one  of his other wheelchairs.  The patient has no skin breakdown area noted, but  does have severe callus formation as noted below.   MEDICATIONS:  Depakote 500 mg b.i.d.   PHYSICAL EXAMINATION:  GENERAL:  A well-appearing, middle-aged adult man  seated in a manual wheelchair.  VITAL SIGNS:  Blood pressure 106/57, pulse 86, respiratory rate 16, and O2  saturation 96% on room air.  EXTREMITIES:  Left lower extremity shows callus formation over the base of  his metatarsal on the left side and at the left posterior heel region.  There is extensive callus formation noted with no skin breakdown noted.   IMPRESSION:  1.  Incomplete spinal cord injury after C4-6 fracture (805.04)  2.  Neurogenic bowel and bladder secondary to incomplete spinal cord injury      (596.54/564.8)  3.  Seizure disorder on Depakote (780.39)  4.  Left foot callus formation.   In the office today, we did discuss the fact that he is not having skin  breakdown and is not in need of any ointment.  He does have a pressure  callus formation from the position that he keeps his left foot in during  driving and during the time that he is in a wheelchair.  He could consider  using a spare piece of carpet on the floor of his car to give his foot a  little bit better padding.  Otherwise, there is going to continue to be  callus formation, but there certainly is no  indication of any ulcerative  lesion.   The patient seemed comforted that he is not at risk of losing his leg  secondary to an ulcer.  He does need to keep an eye on his feet as his  sensory loss if fairly significant.   We will plan on seeing the patient in follow-up in this office in  approximately 4 months' time.           ______________________________  Ellwood Dense, M.D.     DC/MedQ  D:  08/24/2005 12:13:08  T:  08/24/2005 13:16:44  Job #:  [id number]

## 2011-03-09 NOTE — Assessment & Plan Note (Signed)
Mr. Uphoff returns to the clinic today for follow-up evaluation.  He remains  stable at the present time.  He reports that he has been trying to be more  active lately, especially doing chores around his house.  He notes with  increased activity, he has been more regular in terms of his bowel  movements.  They are continent at the present time on a daily program.   The patient reports that, in terms of bladder functioning, he has had rare  hematuria and takes ciprofloxacin 1000 mg given to him by the urologist.  He  reports that he also drank a lot of water and that cleared a recent bout of  hematuria that he had for only a couple of hours.   The patient continues to take his Depakote 500 mg t.i.d. prescribed by Dr.  Thad Ranger.   The patient reports that he has had no skin breakdown problems.  He also  reports that he has two manual chairs and is able to also drive a regular  vehicle at the present time with hand controls.   MEDICATION:  Depakote 500 mg b.i.d.   PHYSICAL EXAMINATION:  GENERAL APPEARANCE:  A well-appearing thin adult male  seated in a manual wheelchair.  VITAL SIGNS:  Blood pressure 113/74 with pulse of 66 and O2 saturation 97%  on room air.  NEUROLOGIC:  The patient has 4+/5 strength throughout his bilateral upper  extremities and absent motor activity below mid cervical region.   IMPRESSION:  1. 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  2. 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  3. Seizure disorder on Depakote (780.39).   At the present time, we also discussed the fact that the patient has poor  function of his hands and that resulted in poor oral hygiene.  He has not  seen a dentist nor had his teeth cleaned for several years now.  I have  given a slip indicating that he has poor hand function so that he can pass  this on to the insurance carrier.  Ideally, he should have his teeth  professionally cleaned at least twice a  year.   I will plan on seeing the patient in follow-up in approximately six months'  time.  No refills of medication are necessary at this point.      Ellwood Dense, M.D.   DC/MedQ  D:  01/26/2004 12:38:42  T:  01/26/2004 14:12:18  Job #:  [id number]

## 2011-03-09 NOTE — Assessment & Plan Note (Signed)
[name] returns to clinic today for followup evaluation.  He reports  that his wound has healed completely on his buttock.  He is using a very  thick aloe lotion, and he reports that that has helped along with  changing the way he gets into his car.  Apparently, he was rubbing his  buttock on a part of the car and causing a sore to develop.   In terms of his bowel and bladder, he reports that all is going well.  He continues to see Dr. Vernie Ammons for his bladder issues.  Those are  stable at the present time.   MEDICATION:  Depakote 500 mg b.i.d.   REVIEW OF SYSTEMS:  Noncontributory.   PHYSICAL EXAMINATION:  Well-appearing, fit, adult male, seated in a  manual wheelchair in no acute discomfort.  Blood pressure 106/75 with a pulse of 85, respiratory rate 16, and O2  saturation 96% on room air.  He has no skin breakdown at this time.   IMPRESSION:  1. Incomplete spinal cord injury secondary to C4-C6 fracture.  2. Neurogenic bowel and bladder.  3. Seizure disorder, on Depakote.  4. Left buttock decubitus, healed.   In the office today, no change of medicines or adjustment of medicines  was necessary.  Will plan on seeing the patient in followup in this  office in approximately 6 months' time, with evaluation at this point  regarding bowel and bladder function along with skin integrity.           ______________________________  [name], M.D.     DC/MedQ  D:  12/25/2006 12:18:56  T:  12/25/2006 12:34:30  Job #:  [id number]

## 2011-03-09 NOTE — Assessment & Plan Note (Signed)
INTERVAL HISTORY:  [name] returns to clinic today for complaints of skin  breakdown involving his left lateral lumbar region.  He reports that he has  noticed that the problem occurred several weeks ago and it was related to  his tub bench which had a rough edge on it.  Apparently it resulted in skin  breakdown and he has been using a pressure relief pad with medication on it.  He would like [place] to write a letter so they can get the proper equipment to  avoid this problem in the future.   MEDICATIONS:  1. Depakote 500 mg b.i.d.  2. Medicated pressure pads to his left lumbar region, change periodically.   REVIEW OF SYSTEMS:  Noncontributory.   PHYSICAL EXAMINATION:  Well-appearing fit adult middle-aged male seated in a  manual wheelchair in no acute discomfort.  Blood pressure was 120/67 with a  pulse of 71, respiratory rate 16 and O2 saturation 96% on room air.  He has  4+/5 strength throughout the bilateral upper extremities and 0/5 strength  throughout the lower extremities.  Examination of his left lumbar region  shows well-healed scars with no open areas.  Each area is approximately 5 mm  wide and approximately 1 cm in length.   IMPRESSION:  1. Incomplete spinal cord injury secondary to C4-6 fracture.  2. Neurogenic bowel and bladder secondary to incomplete spinal cord      injury.  3. Seizure disorder, on Depakote.  4. Right forearm callus.  5. Left lumbar skin breakdown, stable.   At the present time the patient's wound on his lumbar region is healing  well.  That apparently came from a bench that he had been using for bathing  and he does need a different device.  I have recommended a deluxe padded  transfer bench and the catalog number through [employer] is [id number], which is the  deluxe bench with commode cut out.  He also would benefit from heavy duty  steel gauge rust-proof grab rail which should be provided around the toilet  area and bathing area.  The patient has good  upper extremity strength and  will be able to use these appropriately.   We will be faxing this information to [name] at [name] and the fax number  is [phone].           ______________________________  [name], M.D.     DC/MedQ  D:  08/09/2006 16:23:48  T:  08/11/2006 12:53:10  Job #:  [id number]

## 2011-03-09 NOTE — Assessment & Plan Note (Signed)
DATE OF EVALUATION:  Feb 28, 2005.   MEDICAL RECORD NUMBER:  [id number].   [name] returns to the clinic today for followup evaluation.  He reports  that overall he is doing well.  He is not having any problems with skin  breakdown or any of his equipment.   The patient continues to do digital stimulation for bowel movements and does  occur on a regular basis.  He also uses a condom catheter for urine  collection along with having an intrabladder balloon previously placed by  Dr. Vernie Ammons.  He continues to live with his mother at the present time.   The patient overall was bored and he would like to be involved in some type  of work on a volunteer basis or possibly even work for low wages.  He is  interested in possibly looking at the [employer] or possibly  [employer] to see if they would have anything available to him.   MEDICATIONS:  Depakote 500 mg b.i.d.   PHYSICAL EXAMINATION:  GENERAL APPEARANCE:  A well-appearing, adult male  seated in a manual wheelchair.  VITAL SIGNS:  Blood pressure and vitals were not obtained.  EXTREMITIES:  He has 4+/5 strength throughout the bilateral upper  extremities and absent motor strength throughout the mid thoracic region and  downward.   IMPRESSION:  1.  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  2.  Neurogenic bowel and bladder secondary to incomplete spinal cord injury      (596.54/564.8).  3.  Seizure disorder on Depakote (780.39).   In the office today, no refill on medications is necessary.  He is doing  well overall with no complications related to his spinal cord injury.   We will plan on seeing the patient in followup in approximately four to six  months' time.  He is encouraged to look for possible gainful employment or  possibly even volunteer work to occupy his time and keep him at least  mentally health.  Will plan on seeing the patient in followup as noted  above.      DC/MedQ  D:   02/28/2005 14:02:55  T:  02/28/2005 14:51:35  Job #:  [id number]

## 2011-03-13 ENCOUNTER — Ambulatory Visit: Payer: Self-pay | Admitting: Physical Medicine & Rehabilitation

## 2011-08-31 ENCOUNTER — Ambulatory Visit: Payer: Worker's Compensation | Admitting: Physical Medicine & Rehabilitation

## 2011-09-03 ENCOUNTER — Encounter: Payer: Medicare Other | Attending: Physical Medicine & Rehabilitation

## 2011-09-03 ENCOUNTER — Ambulatory Visit: Payer: Worker's Compensation | Admitting: Physical Medicine & Rehabilitation

## 2011-09-03 DIAGNOSIS — IMO0002 Reserved for concepts with insufficient information to code with codable children: Secondary | ICD-10-CM | POA: Insufficient documentation

## 2011-09-03 DIAGNOSIS — K592 Neurogenic bowel, not elsewhere classified: Secondary | ICD-10-CM | POA: Insufficient documentation

## 2011-09-03 DIAGNOSIS — Z993 Dependence on wheelchair: Secondary | ICD-10-CM | POA: Insufficient documentation

## 2011-09-03 DIAGNOSIS — G8254 Quadriplegia, C5-C7 incomplete: Secondary | ICD-10-CM | POA: Insufficient documentation

## 2011-09-03 DIAGNOSIS — X58XXXS Exposure to other specified factors, sequela: Secondary | ICD-10-CM | POA: Insufficient documentation

## 2011-09-03 DIAGNOSIS — N319 Neuromuscular dysfunction of bladder, unspecified: Secondary | ICD-10-CM | POA: Insufficient documentation

## 2011-09-03 DIAGNOSIS — G40909 Epilepsy, unspecified, not intractable, without status epilepticus: Secondary | ICD-10-CM | POA: Insufficient documentation

## 2011-09-03 NOTE — Assessment & Plan Note (Signed)
REASON FOR VISIT:  Spinal cord injury with spastic quadriplegia.  He has had no new medical issues in the interval time period since I saw him in May 2011.  He sees Neurology for history seizure disorder.  Denies any skin issues.  No new motor issues, continue to have some sensation in the lower extremities but not normal.  General, no acute distress, wheelchair-dependent male, intrinsic atrophy in right upper extremity, sensation reduced in lower extremity below the trunk area.  His motor strength is 2 in the right C7 and C8 dermatomes, 4 in bilateral C5-6 myotomes, lower extremity strength is 0.  He has clonus at the knees and ankles bilaterally.  Lower extremities have no skin breakdown in the pretibial area.  IMPRESSION: 1. 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 2. Equipment issues needs extended foot plates for his wheelchair. 3. Needs yearly renal ultrasound.  Discussed with the patient and     agrees with plan.  I will see him back in 6 months.     Erick Colace, M.D. Electronically Signed    AEK/MedQ D:  09/03/2011 10:27:29  T:  09/03/2011 12:38:15  Job #:  [id number]

## 2012-02-25 ENCOUNTER — Encounter: Payer: Worker's Compensation | Attending: Physical Medicine & Rehabilitation

## 2012-02-25 ENCOUNTER — Encounter: Payer: Self-pay | Admitting: Physical Medicine & Rehabilitation

## 2012-02-25 ENCOUNTER — Ambulatory Visit (HOSPITAL_BASED_OUTPATIENT_CLINIC_OR_DEPARTMENT_OTHER): Payer: Worker's Compensation | Admitting: Physical Medicine & Rehabilitation

## 2012-02-25 VITALS — BP 148/96 | HR 82 | Resp 16 | Ht 74.0 in | Wt 170.0 lb

## 2012-02-25 DIAGNOSIS — S14109A Unspecified injury at unspecified level of cervical spinal cord, initial encounter: Secondary | ICD-10-CM | POA: Insufficient documentation

## 2012-02-25 DIAGNOSIS — S14101A Unspecified injury at C1 level of cervical spinal cord, initial encounter: Secondary | ICD-10-CM

## 2012-02-25 DIAGNOSIS — N319 Neuromuscular dysfunction of bladder, unspecified: Secondary | ICD-10-CM | POA: Insufficient documentation

## 2012-02-25 DIAGNOSIS — R259 Unspecified abnormal involuntary movements: Secondary | ICD-10-CM | POA: Insufficient documentation

## 2012-02-25 DIAGNOSIS — G8250 Quadriplegia, unspecified: Secondary | ICD-10-CM | POA: Insufficient documentation

## 2012-02-25 DIAGNOSIS — W19XXXS Unspecified fall, sequela: Secondary | ICD-10-CM | POA: Insufficient documentation

## 2012-02-25 DIAGNOSIS — K592 Neurogenic bowel, not elsewhere classified: Secondary | ICD-10-CM | POA: Insufficient documentation

## 2012-02-25 NOTE — Progress Notes (Signed)
  Subjective:    Patient ID: [name], male    DOB: June 28, 1971, 41 y.o.   MRN: [id number]  HPI A 41 year old male with a fall resulting in incomplete tetraplegia  confined to manual wheelchair, nonambulatory since the injury. He has  been following with me since August 18, 2009. He is doing intermittent  catheterization. He has had occasional urinary tract infection. He  continues to have spasticity, but he can reduce it by crossing his legs.  He has had no skin areas. He has a past medical history significant for  seizure disorder. He has seen neurology in the past  No seizures for at least one year. He follows with Dr.Dohmeier who will need to fill out his state of [place] form. He also follows with a urologist for his chronic bladder issues Patient needs a new wheelchair. His has cracked underneath the seat Pain Inventory Average Pain 0 Pain Right Now 0 My pain is n/a  In the last 24 hours, has pain interfered with the following? General activity 0 Relation with others 0 Enjoyment of life 0 What TIME of day is your pain at its worst? n/a Sleep (in general) Good  Pain is worse with: n/a Pain improves with: n/a Relief from Meds: n/a  Mobility do you drive?  yes use a wheelchair transfers alone  Function disabled: date disabled 09/21/1998  Neuro/Psych No problems in this area  Prior Studies Any changes since last visit?  no  Physicians involved in your care Any changes since last visit?  no      Review of Systems  All other systems reviewed and are negative.       Objective:   Physical Exam  Constitutional: He is oriented to person, place, and time. He appears well-developed and well-nourished.  HENT:  Head: Normocephalic and atraumatic.  Musculoskeletal:       Right ankle: He exhibits decreased range of motion.       Left ankle: He exhibits decreased range of motion.  Neurological: He is alert and oriented to person, place, and time. He  displays atrophy and abnormal reflex. No sensory deficit. He exhibits abnormal muscle tone. Gait abnormal.  Reflex Scores:      Patellar reflexes are 4+ on the right side and 4+ on the left side.      Achilles reflexes are 4+ on the right side and 4+ on the left side.      Clonus at bilat knee ext and ankle DF  Motor strength is 4/5 bilateral deltoid, biceps, triceps wrist extensors 2 minus in the left hand intrinsics and 1/5 in the right hand intrinsics           Assessment & Plan:  1. CT 7 [place] B. quadriparesis. Stable deficits. Needs new wheelchair. Will make referral to physical therapy who will in turn work with the equipment supplier. He has spasticity but does not wish to have any type spasticity medication.

## 2012-02-25 NOTE — Patient Instructions (Signed)
Spinal Cord Injury Spinal cord injury (SCI) occurs when a traumatic event results in damage to cells within the spinal cord or cuts the nerve tracts that relay signals up and down the spinal cord.  CAUSES  The most common types of SCI include:  Contusion. This is bruising of the spinal cord.   Compression. This is caused by pressure on the spinal cord.  Other types of injuries include:   Lacerations. This is severing or tearing of some nerve fibers, such as damage caused by a gun shot wound.   Central cord syndrome. This is specific damage to the corticospinal tracts of the cervical region of the spinal cord.  SYMPTOMS  Severe SCI often causes paralysis (loss of control over voluntary movement and muscles of the body) and loss of sensation and reflex function below the point of injury, including:  Autonomic activity such as breathing.   Other body functions such as bowel and bladder control.  Other symptoms may develop over time:  Pain.   Sensitivity to stimuli.   Muscle spasms.   Sexual dysfunction.  SCI patients are also prone to develop secondary medical problems, such as:  Bladder infections.   Lung infections.   Bedsores.  TREATMENT  Advances in emergency care and rehabilitation allow many SCI patients to survive. Methods for reducing the injury and restoring function are still limited. Immediate treatment for acute SCI includes:   Techniques to relieve cord compression.   Prompt (within 8 hours of the injury) drug therapy with corticosteroids to minimize cell damage.   Stabilization of the vertebrae of the spine to prevent further injury.  WHAT WILL HAPPEN The types of disability associated with SCI vary greatly depending on:  The severity of the injury.   The segment of the spinal cord at which the injury occurs.   Which nerve fibers are damaged.  Most people with SCI regain some functions between a week and 6 months after injury. The likelihood of  spontaneous recovery diminishes after 6 months. Rehabilitation strategies can minimize long-term disability. SEEK IMMEDIATE MEDICAL CARE IF:  You have difficulty breathing or leg swelling.   You have a fever.   You feel nauseous, vomit, or have abdominal pain.   You feel lightheaded or faint.   You develop chest pain, an abnormal heartbeat (palpitations), or a fast heart rate.   You develop bad smelling urine, bedsores, or significant redness on your skin.  Document Released: 09/28/2002 Document Revised: 09/27/2011 Document Reviewed: 05/24/2008 [facility] Patient Information 2012 [place], [place].

## 2012-03-17 ENCOUNTER — Encounter (HOSPITAL_COMMUNITY): Payer: Self-pay | Admitting: *Deleted

## 2012-03-17 ENCOUNTER — Encounter (HOSPITAL_COMMUNITY)
Admission: EM | Disposition: A | Discharge status: Other Acute Inpatient Hospital | Payer: Self-pay | Source: Home / Self Care

## 2012-03-17 ENCOUNTER — Encounter (HOSPITAL_COMMUNITY): Payer: Self-pay | Admitting: Anesthesiology

## 2012-03-17 ENCOUNTER — Emergency Department (HOSPITAL_COMMUNITY): Payer: Medicare Other | Admitting: Anesthesiology

## 2012-03-17 ENCOUNTER — Inpatient Hospital Stay (HOSPITAL_COMMUNITY)
Admission: EM | Admit: 2012-03-17 | Discharge: 2012-03-20 | DRG: 988 | Disposition: A | Discharge status: Other Acute Inpatient Hospital | Payer: Medicare Other | Attending: General Surgery | Admitting: General Surgery

## 2012-03-17 DIAGNOSIS — L02219 Cutaneous abscess of trunk, unspecified: Secondary | ICD-10-CM | POA: Diagnosis present

## 2012-03-17 DIAGNOSIS — G8220 Paraplegia, unspecified: Secondary | ICD-10-CM | POA: Diagnosis present

## 2012-03-17 DIAGNOSIS — N501 Vascular disorders of male genital organs: Principal | ICD-10-CM | POA: Diagnosis present

## 2012-03-17 DIAGNOSIS — K612 Anorectal abscess: Secondary | ICD-10-CM

## 2012-03-17 DIAGNOSIS — G43909 Migraine, unspecified, not intractable, without status migrainosus: Secondary | ICD-10-CM | POA: Diagnosis present

## 2012-03-17 DIAGNOSIS — Z87898 Personal history of other specified conditions: Secondary | ICD-10-CM | POA: Diagnosis present

## 2012-03-17 DIAGNOSIS — Z720 Tobacco use: Secondary | ICD-10-CM | POA: Diagnosis present

## 2012-03-17 DIAGNOSIS — R000 Tachycardia, unspecified: Secondary | ICD-10-CM | POA: Diagnosis present

## 2012-03-17 DIAGNOSIS — F17200 Nicotine dependence, unspecified, uncomplicated: Secondary | ICD-10-CM | POA: Diagnosis present

## 2012-03-17 DIAGNOSIS — N493 Fournier gangrene: Secondary | ICD-10-CM | POA: Diagnosis present

## 2012-03-17 DIAGNOSIS — L03319 Cellulitis of trunk, unspecified: Secondary | ICD-10-CM | POA: Diagnosis present

## 2012-03-17 DIAGNOSIS — R569 Unspecified convulsions: Secondary | ICD-10-CM | POA: Diagnosis present

## 2012-03-17 HISTORY — DX: Tobacco use: Z72.0

## 2012-03-17 HISTORY — DX: Unspecified convulsions: R56.9

## 2012-03-17 LAB — DIFFERENTIAL
Basophils Absolute: 0 10*3/uL (ref 0.0–0.1)
Basophils Relative: 0 % (ref 0–1)
Eosinophils Absolute: 0 10*3/uL (ref 0.0–0.7)
Eosinophils Relative: 0 % (ref 0–5)
Lymphocytes Relative: 6 % — ABNORMAL LOW (ref 12–46)
Lymphs Abs: 2.1 10*3/uL (ref 0.7–4.0)
Monocytes Absolute: 1.7 10*3/uL — ABNORMAL HIGH (ref 0.1–1.0)
Monocytes Relative: 5 % (ref 3–12)
Neutro Abs: 30.4 10*3/uL — ABNORMAL HIGH (ref 1.7–7.7)
Neutrophils Relative %: 89 % — ABNORMAL HIGH (ref 43–77)

## 2012-03-17 LAB — GRAM STAIN

## 2012-03-17 LAB — COMPREHENSIVE METABOLIC PANEL
ALT: 10 U/L (ref 0–53)
AST: 20 U/L (ref 0–37)
Albumin: 2 g/dL — ABNORMAL LOW (ref 3.5–5.2)
Alkaline Phosphatase: 84 U/L (ref 39–117)
BUN: 19 mg/dL (ref 6–23)
CO2: 23 mEq/L (ref 19–32)
Calcium: 7.9 mg/dL — ABNORMAL LOW (ref 8.4–10.5)
Chloride: 95 mEq/L — ABNORMAL LOW (ref 96–112)
Creatinine, Ser: 0.87 mg/dL (ref 0.50–1.35)
GFR calc Af Amer: >90 mL/min (ref >90)
GFR calc non Af Amer: >90 mL/min (ref >90)
Glucose, Bld: 83 mg/dL (ref 70–99)
Potassium: 4.1 mEq/L (ref 3.5–5.1)
Sodium: 133 mEq/L — ABNORMAL LOW (ref 135–145)
Total Bilirubin: 0.5 mg/dL (ref 0.3–1.2)
Total Protein: 6.7 g/dL (ref 6.0–8.3)

## 2012-03-17 LAB — CBC
HCT: 39.4 % (ref 39.0–52.0)
Hemoglobin: 13.8 g/dL (ref 13.0–17.0)
MCH: 31.5 pg (ref 26.0–34.0)
MCHC: 35 g/dL (ref 30.0–36.0)
MCV: 90 fL (ref 78.0–100.0)
Platelets: 309 10*3/uL (ref 150–400)
RBC: 4.38 MIL/uL (ref 4.22–5.81)
RDW: 13.1 % (ref 11.5–15.5)
WBC: 34.2 10*3/uL — ABNORMAL HIGH (ref 4.0–10.5)

## 2012-03-17 LAB — VALPROIC ACID LEVEL: Valproic Acid Lvl: 56 ug/mL (ref 50.0–100.0)

## 2012-03-17 LAB — MRSA PCR SCREENING: MRSA by PCR: NEGATIVE

## 2012-03-17 SURGERY — INCISION AND DRAINAGE, ABSCESS
Anesthesia: General | Site: Rectum | Wound class: Dirty or Infected

## 2012-03-17 MED ORDER — SODIUM CHLORIDE 0.9 % IV SOLN
INTRAVENOUS | Status: DC
  Administered 2012-03-17 – 2012-03-18 (×2): via INTRAVENOUS

## 2012-03-17 MED ORDER — DIPHENHYDRAMINE HCL 12.5 MG/5ML PO ELIX
12.5000 mg | ORAL_SOLUTION | Freq: Four times a day (QID) | ORAL | Status: DC | PRN
  Filled 2012-03-17: qty 5

## 2012-03-17 MED ORDER — PIPERACILLIN-TAZOBACTAM 3.375 G IVPB
3.3750 g | Freq: Three times a day (TID) | INTRAVENOUS | Status: DC
  Administered 2012-03-17 – 2012-03-20 (×9): 3.375 g via INTRAVENOUS
  Filled 2012-03-17 (×11): qty 50

## 2012-03-17 MED ORDER — LACTATED RINGERS IV SOLN: INTRAVENOUS | Status: DC

## 2012-03-17 MED ORDER — LIDOCAINE HCL 2 % EX GEL
CUTANEOUS | Status: AC
  Filled 2012-03-17: qty 10

## 2012-03-17 MED ORDER — BUPIVACAINE-EPINEPHRINE (PF) 0.5% -1:200000 IJ SOLN
INTRAMUSCULAR | Status: AC
  Filled 2012-03-17: qty 10

## 2012-03-17 MED ORDER — PANTOPRAZOLE SODIUM 40 MG IV SOLR
40.0000 mg | Freq: Every day | INTRAVENOUS | Status: DC
  Administered 2012-03-17 – 2012-03-19 (×3): 40 mg via INTRAVENOUS
  Filled 2012-03-17 (×5): qty 40

## 2012-03-17 MED ORDER — ONDANSETRON HCL 4 MG/2ML IJ SOLN
INTRAMUSCULAR | Status: DC | PRN
  Administered 2012-03-17: 4 mg via INTRAVENOUS

## 2012-03-17 MED ORDER — LACTATED RINGERS IV SOLN
INTRAVENOUS | Status: DC | PRN
  Administered 2012-03-17 (×4): via INTRAVENOUS

## 2012-03-17 MED ORDER — CHLORHEXIDINE GLUCONATE 0.12 % MT SOLN
15.0000 mL | Freq: Two times a day (BID) | OROMUCOSAL | Status: DC
  Filled 2012-03-17 (×2): qty 15

## 2012-03-17 MED ORDER — NALOXONE HCL 0.4 MG/ML IJ SOLN: 0.4000 mg | INTRAMUSCULAR | Status: DC | PRN

## 2012-03-17 MED ORDER — SODIUM CHLORIDE 0.9 % IR SOLN
Status: DC | PRN
  Administered 2012-03-17: 3000 mL

## 2012-03-17 MED ORDER — LIDOCAINE HCL (CARDIAC) 20 MG/ML IV SOLN
INTRAVENOUS | Status: DC | PRN
  Administered 2012-03-17: 50 mg via INTRAVENOUS

## 2012-03-17 MED ORDER — ACETAMINOPHEN 650 MG RE SUPP: 650.0000 mg | Freq: Four times a day (QID) | RECTAL | Status: DC | PRN

## 2012-03-17 MED ORDER — VANCOMYCIN HCL 1000 MG IV SOLR
1000.0000 mg | INTRAVENOUS | Status: DC | PRN
  Administered 2012-03-17: 1000 mg via INTRAVENOUS

## 2012-03-17 MED ORDER — FENTANYL CITRATE 0.05 MG/ML IJ SOLN
INTRAMUSCULAR | Status: DC | PRN
  Administered 2012-03-17: 100 ug via INTRAVENOUS
  Administered 2012-03-17 (×2): 50 ug via INTRAVENOUS

## 2012-03-17 MED ORDER — SODIUM CHLORIDE 0.9 % IJ SOLN: 9.0000 mL | INTRAMUSCULAR | Status: DC | PRN

## 2012-03-17 MED ORDER — VANCOMYCIN HCL IN DEXTROSE 1-5 GM/200ML-% IV SOLN
1000.0000 mg | Freq: Once | INTRAVENOUS | Status: AC
  Administered 2012-03-17: 1000 mg via INTRAVENOUS
  Filled 2012-03-17: qty 200

## 2012-03-17 MED ORDER — FENTANYL CITRATE 0.05 MG/ML IJ SOLN: 25.0000 ug | INTRAMUSCULAR | Status: DC | PRN

## 2012-03-17 MED ORDER — HEPARIN SODIUM (PORCINE) 5000 UNIT/ML IJ SOLN
5000.0000 [IU] | Freq: Three times a day (TID) | INTRAMUSCULAR | Status: DC
Start: 2012-03-17 — End: 2012-03-20
  Administered 2012-03-17 – 2012-03-20 (×10): 5000 [IU] via SUBCUTANEOUS
  Filled 2012-03-17 (×13): qty 1

## 2012-03-17 MED ORDER — BIOTENE DRY MOUTH MT LIQD
15.0000 mL | Freq: Two times a day (BID) | OROMUCOSAL | Status: DC
  Administered 2012-03-18 – 2012-03-20 (×4): 15 mL via OROMUCOSAL

## 2012-03-17 MED ORDER — MORPHINE SULFATE (PF) 1 MG/ML IV SOLN
INTRAVENOUS | Status: AC
  Filled 2012-03-17: qty 25

## 2012-03-17 MED ORDER — EPHEDRINE SULFATE 50 MG/ML IJ SOLN
INTRAMUSCULAR | Status: DC | PRN
  Administered 2012-03-17: 10 mg via INTRAVENOUS

## 2012-03-17 MED ORDER — MORPHINE SULFATE (PF) 1 MG/ML IV SOLN
INTRAVENOUS | Status: DC
  Administered 2012-03-17: 1 mg via INTRAVENOUS

## 2012-03-17 MED ORDER — SODIUM CHLORIDE 0.9 % IV BOLUS (SEPSIS)
1000.0000 mL | Freq: Once | INTRAVENOUS | Status: AC
  Administered 2012-03-17: 1000 mL via INTRAVENOUS

## 2012-03-17 MED ORDER — ONDANSETRON HCL 4 MG/2ML IJ SOLN: 4.0000 mg | Freq: Four times a day (QID) | INTRAMUSCULAR | Status: DC | PRN

## 2012-03-17 MED ORDER — SODIUM CHLORIDE 0.9 % IR SOLN
Status: DC | PRN
  Administered 2012-03-17: 1000 mL

## 2012-03-17 MED ORDER — VANCOMYCIN HCL IN DEXTROSE 1-5 GM/200ML-% IV SOLN
1000.0000 mg | Freq: Three times a day (TID) | INTRAVENOUS | Status: DC
  Administered 2012-03-18 – 2012-03-20 (×8): 1000 mg via INTRAVENOUS
  Filled 2012-03-17 (×10): qty 200

## 2012-03-17 MED ORDER — DIPHENHYDRAMINE HCL 50 MG/ML IJ SOLN: 12.5000 mg | Freq: Four times a day (QID) | INTRAMUSCULAR | Status: DC | PRN

## 2012-03-17 MED ORDER — ACETAMINOPHEN 325 MG PO TABS: 650.0000 mg | ORAL_TABLET | Freq: Four times a day (QID) | ORAL | Status: DC | PRN

## 2012-03-17 MED ORDER — DIVALPROEX SODIUM 500 MG PO DR TAB
500.0000 mg | DELAYED_RELEASE_TABLET | Freq: Two times a day (BID) | ORAL | Status: DC
  Administered 2012-03-17 – 2012-03-18 (×2): 500 mg via ORAL
  Filled 2012-03-17 (×4): qty 1

## 2012-03-17 MED ORDER — BUPIVACAINE-EPINEPHRINE PF 0.25-1:200000 % IJ SOLN
INTRAMUSCULAR | Status: AC
  Filled 2012-03-17: qty 30

## 2012-03-17 MED ORDER — MIDAZOLAM HCL 5 MG/5ML IJ SOLN
INTRAMUSCULAR | Status: DC | PRN
  Administered 2012-03-17 (×2): 1 mg via INTRAVENOUS

## 2012-03-17 MED ORDER — PHENYLEPHRINE HCL 10 MG/ML IJ SOLN
INTRAMUSCULAR | Status: DC | PRN
  Administered 2012-03-17 (×3): 40 ug via INTRAVENOUS

## 2012-03-17 MED ORDER — PIPERACILLIN-TAZOBACTAM 3.375 G IVPB
3.3750 g | Freq: Once | INTRAVENOUS | Status: AC
  Administered 2012-03-17: 3.375 g via INTRAVENOUS
  Filled 2012-03-17: qty 50

## 2012-03-17 MED ORDER — PROPOFOL 10 MG/ML IV EMUL
INTRAVENOUS | Status: DC | PRN
  Administered 2012-03-17: 175 mg via INTRAVENOUS

## 2012-03-17 SURGICAL SUPPLY — 11 items
BANDAGE GAUZE ELAST BULKY 4 IN (GAUZE/BANDAGES/DRESSINGS) ×1 IMPLANT
BNDG COHESIVE 4X5 TAN NS LF (GAUZE/BANDAGES/DRESSINGS) ×2 IMPLANT
BRIEF STRETCH FOR OB PAD LRG (UNDERPADS AND DIAPERS) ×2 IMPLANT
DRSG PAD ABDOMINAL 8X10 ST (GAUZE/BANDAGES/DRESSINGS) ×2 IMPLANT
HANDPIECE INTERPULSE COAX TIP (DISPOSABLE) ×2
PACK LITHOTOMY IV (CUSTOM PROCEDURE TRAY) ×1 IMPLANT
SET HNDPC FAN SPRY TIP SCT (DISPOSABLE) ×1 IMPLANT
SPONGE GAUZE 4X4 12PLY (GAUZE/BANDAGES/DRESSINGS) ×2 IMPLANT
SWAB COLLECTION DEVICE MRSA (MISCELLANEOUS) ×1 IMPLANT
TAPE CLOTH SURG 6X10 WHT LF (GAUZE/BANDAGES/DRESSINGS) ×1 IMPLANT
TUBE ANAEROBIC SPECIMEN COL (MISCELLANEOUS) ×1 IMPLANT

## 2012-03-17 NOTE — Op Note (Signed)
Preoperative diagnosis: Fournier's gangrene Postoperative diagnosis: Same as above Procedure: #1 incision and drainage of perirectal abscess and perineal abscess #2 debridement of skin and fatty tissue Surgeon: Dr. Harden Mo Anesthesia: Gen. Endotracheal Specimens: cultures to microbiology Drains: None, wound left open Complications: None Estimated blood loss: 50 cc Sponge needle count correct x2 at end of operation Disposition to recovery in stable condition  Indications: This is a 41 year old male with a history of C5 paraplegia last Wednesday began having some perirectal swelling. This over that time has now extended into his perineum where he is having perineal and scrotal swelling. He has begun having fevers as well as chills. He was brought to the emergency room by his family. On evaluation he had a tender perirectal abscess draining purulence that was extending towards his scrotum anteriorly concerning for Fournier's gangrene. He also was tachycardic with a white blood cell count of 34,000. I discussed an emergent trip to the operating room for incision and drainage of this abscess as well as likely debridement. We discussed the possibility of further operations in the future.  Procedure: After informed consent was obtained the patient was taken to the operating room. He was administered vancomycin and Zosyn prior to this. Sequential compression devices were placed on his legs. He was then placed under general endotracheal anesthesia without complication. His condom catheter was then removed. A Foley catheter was placed. He was then placed in lithotomy position. He was then prepped and draped in the standard sterile surgical fashion. A surgical timeout was performed.  On evaluation he had in the left posterior position a pinhole draining purulent fluid. I used a knife and extended this around anteriorly all the way up to the base of the scrotum. I was able then to get cultures of the  pus. I then opened this all the way up into the abscess cavity. It appeared that he had an ischiorectal abscess that had extedended up towards his scrotum. I evacuated all of the purulent fluid. I debrided some skin and subcutaneous tissue. I did not feel like removing the skin overlying the most of the perineum was going to be indicated. The tissue was mostly viable. There were some thrombosed veins associated with this I don't think there was any other infection that needed to be drained at this time. Medially this cavity is right on his rectum. The remainder of the tissue appeared to be viable. The area was widely opened. I then used the pulse lavage to irrigate this completely. There was no more purulence identified. I then packed this with Betadine soaked gauze. Dressings were then placed overlying this. He tolerated this well was x-rayed in the operating room and transferred to recovery. He is going to remain in the step down unit overnight. I again told his family that depending on how he does clinically he may need to be returned to the operating room for some more debridement.

## 2012-03-17 NOTE — ED Notes (Signed)
MD at bedside.  General Surgery MD Dwain Sarna

## 2012-03-17 NOTE — ED Notes (Signed)
Pt reports first noticing rectal swelling last week, while attempting to disimpact himself.  Pt is paraplegic.  Pt reports rectal bleeding last night.  Pt is diaphoretic and has intermittent leg spasms.  Condom catheter noted.  Pt reports feeling thirsty and continues to ask for water, pt made aware that since he waiting for general surgeon to consult that he cannot have anything to eat or drink at this time.

## 2012-03-17 NOTE — Progress Notes (Addendum)
ANTIBIOTIC CONSULT NOTE - INITIAL  Pharmacy Consult for Vancomycin/Zosyn Indication: Perirectal abscess  No Known Allergies  Patient Measurements:   Vital Signs: Temp: 98 F (36.7 C) (05/27 1526) Temp src: Oral (05/27 1124) BP: 106/50 mmHg (05/27 1530) Pulse Rate: 103  (05/27 1530) Intake/Output from previous day:   Intake/Output from this shift: Total I/O In: 2000 [I.V.:2000] Out: 750 [Urine:650; Blood:100]  Labs:  Basename 03/17/12 1155  WBC 34.2*  HGB 13.8  PLT 309  LABCREA --  CREATININE 0.87   The CrCl is unknown because both a height and weight (above a minimum accepted value) are required for this calculation. No results found for this basename: VANCOTROUGH:2,VANCOPEAK:2,VANCORANDOM:2,GENTTROUGH:2,GENTPEAK:2,GENTRANDOM:2,TOBRATROUGH:2,TOBRAPEAK:2,TOBRARND:2,AMIKACINPEAK:2,AMIKACINTROU:2,AMIKACIN:2, in the last 72 hours   Microbiology: No results found for this or any previous visit (from the past 720 hour(s)).  Medical History: Past Medical History  Diagnosis Date  . Seizures     Medications:  Scheduled:    . heparin  5,000 Units Subcutaneous Q8H  . morphine   Intravenous Q4H  . piperacillin-tazobactam (ZOSYN)  IV  3.375 g Intravenous Once  . sodium chloride  1,000 mL Intravenous Once  . vancomycin  1,000 mg Intravenous Once   Infusions:    . lactated ringers    . lactated ringers     PRN: diphenhydrAMINE, diphenhydrAMINE, fentaNYL, naloxone, ondansetron (ZOFRAN) IV, sodium chloride, sodium chloride irrigation  Assessment:  41 yo paraplegic M with perirectal abscess s/p I&D receiving broad spectrum antibiotic coverage with vanc/zosyn.  Wt 77 kg; CrCl > 100 ml/min   Abscess/blood cultures pending  Goal of Therapy:  Vancomycin trough level 15-20 mcg/ml Zosyn per renal function  Plan:  1.) Vancomycin 1 gm IV q8h, patient received 3 grams pre-operatively, will give next dose at 0500 2.) Zosyn 3.375 q8h 3.) Monitor renal function, check  vancomycin trough at Css 4.) Follow culture results  Adith Tejada, Loma Messing PharmD 4:13 PM 03/17/2012

## 2012-03-17 NOTE — Progress Notes (Signed)
Dressing saturated with serosanguinous fluid that seeped thru to bed pad. First layer of wound packing removed as small amount of stool was on outer pieces of gauze. Area around wound cleansed thoroughly with NS and incontinence cleanser, then patted dry. New ABD pad applied and mesh briefs replaced. Pt informed of actions taken.

## 2012-03-17 NOTE — H&P (Signed)
JOWELL BOSSI is an 41 y.o. male.   Chief Complaint: buttock swelling and tenderness, fever and chills HPI: 49 yom who is C5 paraplegic who presents since last Wednesday with increasing perirectal pain/swelling that is now extending into his perineum.  He describes subjective fevers and chills.  He usually stimulates himself to have a bm and has been unable to do this for past several days.   Past Medical History  Diagnosis Date  . Seizures     Past Surgical History  Procedure Date  . Spine surgery   . Bladder surgery     Family History  Problem Relation Age of Onset  . Hypertension Mother    Social History:  reports that he has been smoking Cigarettes.  He has a 21 pack-year smoking history. He has never used smokeless tobacco. He reports that he does not drink alcohol or use illicit drugs.  Allergies: No Known Allergies  Meds depakote  Results for orders placed during the hospital encounter of 03/17/12 (from the past 48 hour(s))  CBC     Status: Abnormal   Collection Time   03/17/12 11:55 AM      Component Value Range Comment   WBC 34.2 (*) 4.0 - 10.5 (K/uL)    RBC 4.38  4.22 - 5.81 (MIL/uL)    Hemoglobin 13.8  13.0 - 17.0 (g/dL)    HCT 91.4  78.2 - 95.6 (%)    MCV 90.0  78.0 - 100.0 (fL)    MCH 31.5  26.0 - 34.0 (pg)    MCHC 35.0  30.0 - 36.0 (g/dL)    RDW 21.3  08.6 - 57.8 (%)    Platelets 309  150 - 400 (K/uL)   DIFFERENTIAL     Status: Abnormal   Collection Time   03/17/12 11:55 AM      Component Value Range Comment   Neutrophils Relative 89 (*) 43 - 77 (%)    Lymphocytes Relative 6 (*) 12 - 46 (%)    Monocytes Relative 5  3 - 12 (%)    Eosinophils Relative 0  0 - 5 (%)    Basophils Relative 0  0 - 1 (%)    Neutro Abs 30.4 (*) 1.7 - 7.7 (K/uL)    Lymphs Abs 2.1  0.7 - 4.0 (K/uL)    Monocytes Absolute 1.7 (*) 0.1 - 1.0 (K/uL)    Eosinophils Absolute 0.0  0.0 - 0.7 (K/uL)    Basophils Absolute 0.0  0.0 - 0.1 (K/uL)    Smear Review MORPHOLOGY UNREMARKABLE        No results found.  Review of Systems  Constitutional: Positive for fever and chills.    Blood pressure 121/85, pulse 102, temperature 98.2 F (36.8 C), temperature source Oral, resp. rate 16, SpO2 100.00%. Physical Exam  Vitals reviewed. Constitutional: He appears well-developed and well-nourished.  Eyes: No scleral icterus.  Cardiovascular: Regular rhythm and normal heart sounds.  Tachycardia present.   Respiratory: Effort normal and breath sounds normal. He has no wheezes. He has no rales.  GI: Soft. There is no tenderness.  Genitourinary:        Assessment/Plan Likely fourniers gangrene  We discussed going to operating room emergently for drainage and debridement of perineum possibly including scrotum.  We discussed open wound, possible further trips to operating room for debridement.  Risks discussed with both family and patient.  Remiel Corti 03/17/2012, 12:45 PM

## 2012-03-17 NOTE — ED Notes (Signed)
UJW:[id number]<YN> Expected date:03/17/12<BR> Expected time:10:46 AM<BR> Means of arrival:Ambulance<BR> Comments:<BR> Rectal pain

## 2012-03-17 NOTE — ED Notes (Signed)
Family at bedside.  Pt's twin brother and mother

## 2012-03-17 NOTE — ED Notes (Signed)
Per EMS, pt from home, reports rectal swelling which started last week, states he noticed the swelling when he was disimpacting himself.  PT reports bleeding started today.  Was hypotensive upon arrival to home.  Denies any pain per EMS. Pt reports finishing his doxycycline for UTI.

## 2012-03-17 NOTE — Transfer of Care (Signed)
Immediate Anesthesia Transfer of Care Note  Patient: [name]  Procedure(s) Performed: Procedure(s) (LRB): INCISION AND DRAINAGE ABSCESS (N/A)  Patient Location: PACU  Anesthesia Type: General  Level of Consciousness: awake, alert , oriented and patient cooperative  Airway & Oxygen Therapy: Patient Spontanous Breathing and Patient connected to face mask oxygen  Post-op Assessment: Report given to PACU RN and Post -op Vital signs reviewed and stable  Post vital signs: stable  Complications: No apparent anesthesia complications

## 2012-03-17 NOTE — Anesthesia Preprocedure Evaluation (Addendum)
Anesthesia Evaluation  Patient identified by MRN, date of birth, ID band Patient awake    Reviewed: Allergy & Precautions, H&P , NPO status , Patient's Chart, lab work & pertinent test results  Airway Mallampati: II TM Distance: >3 FB Neck ROM: full    Dental No notable dental hx. (+) Teeth Intact and Dental Advisory Given   Pulmonary neg pulmonary ROS,  breath sounds clear to auscultation  Pulmonary exam normal       Cardiovascular Exercise Tolerance: Good negative cardio ROS  Rhythm:regular Rate:Normal     Neuro/Psych Seizures -, Well Controlled,  C5 quadraplegic negative psych ROS   GI/Hepatic negative GI ROS, Neg liver ROS,   Endo/Other  negative endocrine ROS  Renal/GU negative Renal ROS  negative genitourinary   Musculoskeletal   Abdominal   Peds  Hematology negative hematology ROS (+)   Anesthesia Other Findings   Reproductive/Obstetrics negative OB ROS                          Anesthesia Physical Anesthesia Plan  ASA: III  Anesthesia Plan: General   Post-op Pain Management:    Induction: Intravenous  Airway Management Planned: LMA  Additional Equipment:   Intra-op Plan:   Post-operative Plan:   Informed Consent: I have reviewed the patients History and Physical, chart, labs and discussed the procedure including the risks, benefits and alternatives for the proposed anesthesia with the patient or authorized representative who has indicated his/her understanding and acceptance.   Dental Advisory Given  Plan Discussed with: CRNA and Surgeon  Anesthesia Plan Comments:        Anesthesia Quick Evaluation

## 2012-03-17 NOTE — Anesthesia Postprocedure Evaluation (Signed)
  Anesthesia Post-op Note  Patient: [name]  Procedure(s) Performed: Procedure(s) (LRB): INCISION AND DRAINAGE ABSCESS (N/A)  Patient Location: PACU  Anesthesia Type: General  Level of Consciousness: awake, alert , oriented, patient cooperative and responds to stimulation  Airway and Oxygen Therapy: Patient Spontanous Breathing and Patient connected to face mask oxygen  Post-op Pain: none  Post-op Assessment: Post-op Vital signs reviewed, Patient's Cardiovascular Status Stable, Respiratory Function Stable, Patent Airway, No signs of Nausea or vomiting and Pain level controlled  Post-op Vital Signs: stable  Complications: No apparent anesthesia complications

## 2012-03-17 NOTE — ED Notes (Signed)
Pt's brother signed consent for surgery-pt's aware.

## 2012-03-17 NOTE — ED Provider Notes (Signed)
History     CSN: [id number]  Arrival date & time 03/17/12  1106   First MD Initiated Contact with Patient 03/17/12 1121      Chief Complaint  Patient presents with  . Rectal Bleeding    (Consider location/radiation/quality/duration/timing/severity/associated sxs/prior treatment) HPI Patient with perirectal swelling and pus draining.  Patient wit history of cervical spine injury and uses digital rectal stimulation Redness and swelling noted for three days with pain and increasing swelling.  No fever noted.  Drainage noted last night.  Patient without chills and eating ok.  He has not had similar symptoms previously.  Symptoms are severe worsening perianal to scrotum Past Medical History  Diagnosis Date  . Seizures     Past Surgical History  Procedure Date  . Spine surgery   . Bladder surgery     Family History  Problem Relation Age of Onset  . Hypertension Mother     History  Substance Use Topics  . Smoking status: Current Everyday Smoker -- 1.0 packs/day for 21 years    Types: Cigarettes  . Smokeless tobacco: Never Used  . Alcohol Use: No      Review of Systems  Allergies  Review of patient's allergies indicates no known allergies.  Home Medications   Current Outpatient Rx  Name Route Sig Dispense Refill  . DIVALPROEX SODIUM 500 MG PO TBEC Oral Take 500 mg by mouth 2 (two) times daily.      BP 121/85  Pulse 102  Temp(Src) 98.2 F (36.8 C) (Oral)  Resp 16  SpO2 100%  Physical Exam  ED Course  Procedures (including critical care time)   Labs Reviewed  CULTURE, BLOOD (ROUTINE X 2)  CULTURE, BLOOD (ROUTINE X 2)  CBC  DIFFERENTIAL  COMPREHENSIVE METABOLIC PANEL  VALPROIC ACID LEVEL   No results found.   No diagnosis found.    MDM  Symptoms c.w. Fournier's gangrene.  Patient with blood cultures ordered and Zosyn and vanc ordered. Patient hemodynamically stable and blood work pending.      12:06 PM Discussed with Dr. Dwain Sarna and he  will be in to evaluate.  CRITICAL CARE Performed by: Hilario Quarry   Total critical care time: 40  Critical care time was exclusive of separately billable procedures and treating other patients.  Critical care was necessary to treat or prevent imminent or life-threatening deterioration.  Critical care was time spent personally by me on the following activities: development of treatment plan with patient and/or surrogate as well as nursing, discussions with consultants, evaluation of patient's response to treatment, examination of patient, obtaining history from patient or surrogate, ordering and performing treatments and interventions, ordering and review of laboratory studies, ordering and review of radiographic studies, pulse oximetry and re-evaluation of patient's condition.   Hilario Quarry, MD 03/19/12 1540

## 2012-03-18 LAB — BASIC METABOLIC PANEL
BUN: 14 mg/dL (ref 6–23)
CO2: 28 mEq/L (ref 19–32)
Calcium: 7.6 mg/dL — ABNORMAL LOW (ref 8.4–10.5)
Chloride: 100 mEq/L (ref 96–112)
Creatinine, Ser: 0.66 mg/dL (ref 0.50–1.35)
GFR calc Af Amer: >90 mL/min (ref >90)
GFR calc non Af Amer: >90 mL/min (ref >90)
Glucose, Bld: 81 mg/dL (ref 70–99)
Potassium: 3.8 mEq/L (ref 3.5–5.1)
Sodium: 135 mEq/L (ref 135–145)

## 2012-03-18 LAB — CBC
HCT: 34.3 % — ABNORMAL LOW (ref 39.0–52.0)
Hemoglobin: 11.8 g/dL — ABNORMAL LOW (ref 13.0–17.0)
MCH: 31.1 pg (ref 26.0–34.0)
MCHC: 34.4 g/dL (ref 30.0–36.0)
MCV: 90.3 fL (ref 78.0–100.0)
Platelets: 309 10*3/uL (ref 150–400)
RBC: 3.8 MIL/uL — ABNORMAL LOW (ref 4.22–5.81)
RDW: 13.3 % (ref 11.5–15.5)
WBC: 26.3 10*3/uL — ABNORMAL HIGH (ref 4.0–10.5)

## 2012-03-18 MED ORDER — DIVALPROEX SODIUM 500 MG PO DR TAB: 500.0000 mg | DELAYED_RELEASE_TABLET | Freq: Two times a day (BID) | ORAL | Status: DC

## 2012-03-18 MED ORDER — HYDROCODONE-ACETAMINOPHEN 5-325 MG PO TABS: 1.0000 | ORAL_TABLET | ORAL | Status: DC | PRN

## 2012-03-18 MED ORDER — PRO-STAT SUGAR FREE PO LIQD
30.0000 mL | Freq: Two times a day (BID) | ORAL | Status: DC
  Administered 2012-03-18 – 2012-03-20 (×3): 30 mL via ORAL
  Filled 2012-03-18 (×7): qty 30

## 2012-03-18 MED ORDER — PNEUMOCOCCAL VAC POLYVALENT 25 MCG/0.5ML IJ INJ
0.5000 mL | INJECTION | INTRAMUSCULAR | Status: AC
  Administered 2012-03-19: 0.5 mL via INTRAMUSCULAR
  Filled 2012-03-18: qty 0.5

## 2012-03-18 MED ORDER — DIVALPROEX SODIUM ER 500 MG PO TB24
500.0000 mg | ORAL_TABLET | Freq: Two times a day (BID) | ORAL | Status: DC
  Administered 2012-03-18 – 2012-03-20 (×4): 500 mg via ORAL
  Filled 2012-03-18 (×2): qty 1

## 2012-03-18 MED ORDER — CHLORHEXIDINE GLUCONATE 0.12 % MT SOLN
15.0000 mL | Freq: Two times a day (BID) | OROMUCOSAL | Status: DC
  Administered 2012-03-19 – 2012-03-20 (×3): 15 mL via OROMUCOSAL
  Filled 2012-03-18 (×5): qty 15

## 2012-03-18 NOTE — Progress Notes (Signed)
General Surgery Note  LOS: 1 day  POD# 1  Assessment/Plan:  1.  Perirectal abscess/Fournier's gangrene - I&D - 03/17/2012 by Dr. Darnelle Spangle  On Zosyn/Vanc.  WBC high, but coming down.  Mother and brother in room.  Looks good.  Will transfer to 5W.  2.  C5 paraplegia.  1999 from fall. 3.  Smokes cigarettes. 4.  History of seizures.    He can take his own depakote. 5.  DVT proph - SQ heparin.  Subjective:  Hungry, wants to eat.  No pain or complaints.  Wound is in area of anesthesia from paraplegia.  Objective:   Filed Vitals:   03/18/12 0841  BP:   Pulse:   Temp:   Resp: 20     Intake/Output from previous day:  05/27 0701 - 05/28 0700 In: 3407.9 [I.V.:3122.9; IV Piggyback:285] Out: 2000 [Urine:1900; Blood:100]  Intake/Output this shift:      Physical Exam:   General: AA M who is alert and oriented.    HEENT: Normal. Pupils equal. .   Lungs: Clear.   Abdomen: Soft.   Wound: Left buttocks wound that is reasonably clean.  He has about a 9 cm open wound.   Psychiatric: Has normal mood and affect. Mad at not getting things exactly the way he has them at home.   Lab Results:    Basename 03/18/12 0345 03/17/12 1155  WBC 26.3* 34.2*  HGB 11.8* 13.8  HCT 34.3* 39.4  PLT 309 309    BMET   Basename 03/18/12 0345 03/17/12 1155  NA 135 133*  K 3.8 4.1  CL 100 95*  CO2 28 23  GLUCOSE 81 83  BUN 14 19  CREATININE 0.66 0.87  CALCIUM 7.6* 7.9*    PT/INR  No results found for this basename: LABPROT:2,INR:2 in the last 72 hours  ABG  No results found for this basename: PHART:2,PCO2:2,PO2:2,HCO3:2 in the last 72 hours   Studies/Results:  No results found.   Anti-infectives:   Anti-infectives     Start     Dose/Rate Route Frequency Ordered Stop   03/18/12 0500   vancomycin (VANCOCIN) IVPB 1000 mg/200 mL premix        1,000 mg 200 mL/hr over 60 Minutes Intravenous Every 8 hours 03/17/12 1620     03/17/12 2100  piperacillin-tazobactam (ZOSYN) IVPB 3.375 g        3.375 g 12.5 mL/hr over 240 Minutes Intravenous 3 times per day 03/17/12 1620     03/17/12 1230  piperacillin-tazobactam (ZOSYN) IVPB 3.375 g       3.375 g 100 mL/hr over 30 Minutes Intravenous  Once 03/17/12 1139 03/17/12 1304   03/17/12 1230   vancomycin (VANCOCIN) IVPB 1000 mg/200 mL premix        1,000 mg 200 mL/hr over 60 Minutes Intravenous  Once 03/17/12 1139 03/17/12 1359          Ovidio Kin, MD, FACS Pager: [phone],   [facility] Office: [phone] 03/18/2012

## 2012-03-18 NOTE — Progress Notes (Signed)
CARE MANAGEMENT NOTE 03/18/2012  Patient:  [name], [name]   Account Number:  [id number]  Date Initiated:  03/18/2012  Documentation initiated by:  Phaedra Colgate  Subjective/Objective Assessment:   patient taken to or for large and extensive perineal abcess,hx of c5 paralegia,gangrene in surgical site     Action/Plan:   lives at home   Anticipated DC Date:  03/19/2012   Anticipated DC Plan:  HOME W HOME HEALTH SERVICES  In-house referral  NA      DC Planning Services  NA      [facility] Choice  NA   Choice offered to / List presented to:  NA   DME arranged  NA      DME agency  NA     HH arranged  NA      HH agency  NA   Status of service:  In process, will continue to follow Medicare Important Message given?  NA - LOS <3 / Initial given by admissions (If response is "NO", the following Medicare IM given date fields will be blank) Date Medicare IM given:   Date Additional Medicare IM given:    Discharge Disposition:    Per UR Regulation:  Reviewed for med. necessity/level of care/duration of stay  If discussed at Long Length of Stay Meetings, dates discussed:    Comments:  [id number]/Asjia Berrios Earlene Plater, RN, BSN, CCM No discharge needs present at time of this review at the sdu/icu level. Case Management [phone]

## 2012-03-18 NOTE — Progress Notes (Signed)
INITIAL ADULT NUTRITION ASSESSMENT Date: 03/18/2012   Time: 1:19 PM Reason for Assessment: Nutrition Risk for dysphagia  ASSESSMENT: Male 41 y.o.  Dx: Perirectal abscess/ Fournier's gangrene  Hx:  Past Medical History  Diagnosis Date  . Seizures     Related Meds:  Scheduled Meds:   . antiseptic oral rinse  15 mL Mouth Rinse q12n4p  . chlorhexidine  15 mL Mouth Rinse BID  . divalproex  500 mg Oral BID  . heparin  5,000 Units Subcutaneous Q8H  . morphine      . pantoprazole (PROTONIX) IV  40 mg Intravenous QHS  . piperacillin-tazobactam (ZOSYN)  IV  3.375 g Intravenous Q8H  . pneumococcal 23 valent vaccine  0.5 mL Intramuscular Tomorrow-1000  . vancomycin  1,000 mg Intravenous Once  . vancomycin  1,000 mg Intravenous Q8H  . DISCONTD: divalproex  500 mg Oral BID  . DISCONTD: morphine   Intravenous Q4H   Continuous Infusions:   . sodium chloride 125 mL/hr at 03/17/12 1737  . DISCONTD: lactated ringers    . DISCONTD: lactated ringers     PRN Meds:.acetaminophen, acetaminophen, HYDROcodone-acetaminophen, ondansetron, sodium chloride irrigation, DISCONTD: diphenhydrAMINE, DISCONTD: diphenhydrAMINE, DISCONTD: fentaNYL, DISCONTD: naloxone, DISCONTD: ondansetron (ZOFRAN) IV, DISCONTD: sodium chloride, DISCONTD: sodium chloride irrigation   Ht: 6\' 1"  (185.4 cm)  Wt: 146 lb 9.7 oz (66.5 kg)  Ideal Wt: 79.5 kg % Ideal Wt: 83.4% Wt Readings from Last 10 Encounters:  03/18/12 146 lb 9.7 oz (66.5 kg)  03/18/12 146 lb 9.7 oz (66.5 kg)  02/25/12 170 lb (77.111 kg)   Usual Wt: 160 lb  Per patient  % Usual Wt: 91.2%  Body mass index is 19.34 kg/(m^2). (WNL)  Food/Nutrition Related Hx: Patient reports appetite comes and goes. He reported to eat 2 meals daily. Noted patient with wound to left buttock. Patient with paraplegia. Patient's diet has been advanced this morning from NPO to regular diet. Patient denies receiving snacks to increase PO intake.   Labs:  CMP     Component  Value Date/Time   NA 135 03/18/2012 0345   K 3.8 03/18/2012 0345   CL 100 03/18/2012 0345   CO2 28 03/18/2012 0345   GLUCOSE 81 03/18/2012 0345   BUN 14 03/18/2012 0345   CREATININE 0.66 03/18/2012 0345   CALCIUM 7.6* 03/18/2012 0345   PROT 6.7 03/17/2012 1155   ALBUMIN 2.0* 03/17/2012 1155   AST 20 03/17/2012 1155   ALT 10 03/17/2012 1155   ALKPHOS 84 03/17/2012 1155   BILITOT 0.5 03/17/2012 1155   GFRNONAA >90 03/18/2012 0345   GFRAA >90 03/18/2012 0345    Intake/Output Summary (Last 24 hours) at 03/18/12 1324 Last data filed at 03/18/12 1316  Gross per 24 hour  Intake 4352.92 ml  Output   2600 ml  Net 1752.92 ml     Diet Order: General  Supplements/Tube Feeding: none at this time.   IVF:    sodium chloride Last Rate: 125 mL/hr at 03/17/12 1737  DISCONTD: lactated ringers   DISCONTD: lactated ringers     Estimated Nutritional Needs:   Kcal: 1610-9604 Protein: 99.4-119.3 grams  Fluid: 1 ml per kcal intake  NUTRITION DIAGNOSIS: -Increased nutrient needs (NI-5.1).  Status: Ongoing  RELATED TO: wound healing and unintentional weight loss  AS EVIDENCE BY: patient with wound to left buttock,patient reported to only eat 2 meals daily PTA,  and 91.2% of UBW.   MONITORING/EVALUATION(Goals): PO intake, weights, labs, skin 1. PO intake > 75% at meals. 2. Minimize  weight loss.   EDUCATION NEEDS: -No education needs identified at this time  INTERVENTION: 1. Will order patient prostat BID. Provides 200 kcal and 30 grams of protein daily.  2. RD to follow for nutrition plan of care.   Dietitian [phone]  DOCUMENTATION CODES Per approved criteria  -Not Applicable    Iven Finn [facility] 03/18/2012, 1:19 PM

## 2012-03-19 LAB — CBC
HCT: 36.3 % — ABNORMAL LOW (ref 39.0–52.0)
Hemoglobin: 12.2 g/dL — ABNORMAL LOW (ref 13.0–17.0)
MCH: 30.9 pg (ref 26.0–34.0)
MCHC: 33.6 g/dL (ref 30.0–36.0)
MCV: 91.9 fL (ref 78.0–100.0)
Platelets: 280 10*3/uL (ref 150–400)
RBC: 3.95 MIL/uL — ABNORMAL LOW (ref 4.22–5.81)
RDW: 13.4 % (ref 11.5–15.5)
WBC: 18.7 10*3/uL — ABNORMAL HIGH (ref 4.0–10.5)

## 2012-03-19 LAB — DIFFERENTIAL
Basophils Absolute: 0 10*3/uL (ref 0.0–0.1)
Basophils Relative: 0 % (ref 0–1)
Eosinophils Absolute: 0.7 10*3/uL (ref 0.0–0.7)
Eosinophils Relative: 4 % (ref 0–5)
Lymphocytes Relative: 10 % — ABNORMAL LOW (ref 12–46)
Lymphs Abs: 1.9 10*3/uL (ref 0.7–4.0)
Monocytes Absolute: 1.4 10*3/uL — ABNORMAL HIGH (ref 0.1–1.0)
Monocytes Relative: 8 % (ref 3–12)
Neutro Abs: 14.7 10*3/uL — ABNORMAL HIGH (ref 1.7–7.7)
Neutrophils Relative %: 79 % — ABNORMAL HIGH (ref 43–77)

## 2012-03-19 LAB — VANCOMYCIN, TROUGH: Vancomycin Tr: 15.8 ug/mL (ref 10.0–20.0)

## 2012-03-19 NOTE — Progress Notes (Signed)
Spoke with patient at bedside regarding disposition plans. Patient states he lives with his mother but she works during the day. He states he does not have any assistance at home that could assist him with ADL's and wound care. He is requesting placement at d/c. Discussed with Betha Loa option for [facility]. Will eval for LTACH vs SNF for wound care. CSW notified as well.

## 2012-03-19 NOTE — Progress Notes (Signed)
2 Days Post-Op  Subjective: Complaining of "wet farts,"  He has allot of feculent material on dressing.  He can also feel everything you do.  Objective: Vital signs in last 24 hours: Temp:  [98.1 F (36.7 C)-100.4 F (38 C)] 98.1 F (36.7 C) (05/29 0615) Pulse Rate:  [87-102] 102  (05/29 0615) Resp:  [17-20] 18  (05/29 0615) BP: (99-157)/(60-87) 128/87 mmHg (05/29 0615) SpO2:  [94 %-96 %] 95 % (05/29 0615) FiO2 (%):  [100 %] 100 % (05/28 1200) Last BM Date: 03/18/12  5 BM's recorded yesterday, Tm 100.4,BP better, WBC improving, cultures pending  Intake/Output from previous day: 05/28 0701 - 05/29 0700 In: 2321.3 [P.O.:280; I.V.:1353.8; IV Piggyback:687.5] Out: 1425 [Urine:1425] Intake/Output this shift:    General appearance: alert, cooperative and no distress Incision/Wound: Open and clean, it's about 1 inch from the rectum and dressing is covered in feculent material.  Lab Results:   Basename 03/19/12 0346 03/18/12 0345  WBC 18.7* 26.3*  HGB 12.2* 11.8*  HCT 36.3* 34.3*  PLT 280 309    BMET  Basename 03/18/12 0345 03/17/12 1155  NA 135 133*  K 3.8 4.1  CL 100 95*  CO2 28 23  GLUCOSE 81 83  BUN 14 19  CREATININE 0.66 0.87  CALCIUM 7.6* 7.9*   PT/INR No results found for this basename: LABPROT:2,INR:2 in the last 72 hours   Lab 03/17/12 1155  AST 20  ALT 10  ALKPHOS 84  BILITOT 0.5  PROT 6.7  ALBUMIN 2.0*     Lipase  No results found for this basename: lipase     Studies/Results: No results found.  Medications:    . antiseptic oral rinse  15 mL Mouth Rinse q12n4p  . chlorhexidine  15 mL Mouth Rinse BID  . divalproex  500 mg Oral Q12H  . feeding supplement  30 mL Oral BID WC  . heparin  5,000 Units Subcutaneous Q8H  . pantoprazole (PROTONIX) IV  40 mg Intravenous QHS  . piperacillin-tazobactam (ZOSYN)  IV  3.375 g Intravenous Q8H  . pneumococcal 23 valent vaccine  0.5 mL Intramuscular Tomorrow-1000  . vancomycin  1,000 mg Intravenous Q8H   . DISCONTD: chlorhexidine  15 mL Mouth Rinse BID  . DISCONTD: divalproex  500 mg Oral BID  . DISCONTD: divalproex  500 mg Oral BID  . DISCONTD: morphine   Intravenous Q4H    Assessment/Plan Perirectal abscess/Fournier's gangrene - I&D - 03/17/2012 by Dr. Darnelle Spangle  On Zosyn/Vanc.  C5 paraplegia Smokes cigarettes History of seizures.  DVT proph - SQ heparin   Plan:  Hydro Rx to wound, continue wet to dry dressings,, ask case manager to see and work on home care plan.  We are going to shave some of his hair around the incision so we have something to tape to.  He will need frequent dressing changes.  i told him I would plan to continue his normal rectal stimulation, but that he would need to have some help to redress.    LOS: 2 days   [name] 03/19/2012  Patient gets confused about dates. He is only 2 days post op, but acts like it has been a week.  He will need further opening up the cephalic portion of the wound.  I told him it would be at least 2 to 3 months before this heals.  I talked about a colostomy, but I don't think he was listening.  Ovidio Kin, MD, [facility] Pager: [phone] Office phone:  [phone]    

## 2012-03-19 NOTE — Progress Notes (Signed)
CSW received consult and fax pt out for skilled options  [name] C. Leighton Ruff [phone]

## 2012-03-19 NOTE — Progress Notes (Signed)
Pt has had three diarrheal stools since 1900.  Each time he has a loose stool his rectal dressing had to be repacked with wet to dry dressing again.  Will continue to monitor.

## 2012-03-19 NOTE — Progress Notes (Signed)
Had to change pt's wound dressing again due to it being soiled with loose stool.  May need a flexiseal in pt's rectum to protect wound from becoming infected with stool.

## 2012-03-19 NOTE — Progress Notes (Signed)
ANTIBIOTIC CONSULT NOTE - FOLLOW UP  Pharmacy Consult for Vancomycin/Zosyn Indication: Peri-rectal abscess  No Known Allergies  Patient Measurements: Height: 6\' 1"  (185.4 cm) Weight: 146 lb 9.7 oz (66.5 kg) IBW/kg (Calculated) : 79.9   Vital Signs: Temp: 98.1 F (36.7 C) (05/29 0615) Temp src: Oral (05/29 0615) BP: 128/87 mmHg (05/29 0615) Pulse Rate: 102  (05/29 0615) Intake/Output from previous day: 05/28 0701 - 05/29 0700 In: 2321.3 [P.O.:280; I.V.:1353.8; IV Piggyback:687.5] Out: 1425 [Urine:1425] Intake/Output from this shift: Total I/O In: 120 [P.O.:120] Out: 400 [Urine:400]  Labs:  [facility] 03/19/12 0346 03/18/12 0345 03/17/12 1155  WBC 18.7* 26.3* 34.2*  HGB 12.2* 11.8* 13.8  PLT 280 309 309  LABCREA -- -- --  CREATININE -- 0.66 0.87   Estimated Creatinine Clearance: 115.5 ml/min (by C-G formula based on Cr of 0.66).  Basename 03/19/12 1025  VANCOTROUGH 15.8  VANCOPEAK --  VANCORANDOM --  GENTTROUGH --  GENTPEAK --  GENTRANDOM --  TOBRATROUGH --  TOBRAPEAK --  TOBRARND --  AMIKACINPEAK --  AMIKACINTROU --  AMIKACIN --     Microbiology: Recent Results (from the past 720 hour(s))  CULTURE, BLOOD (ROUTINE X 2)     Status: Normal (Preliminary result)   Collection Time   03/17/12 11:55 AM      Component Value Range Status Comment   Specimen Description BLOOD RIGHT ARM   Final    Special Requests BOTTLES DRAWN AEROBIC ONLY 3CC.   Final    Culture  Setup Time [id number]   Final    Culture     Final    Value:        BLOOD CULTURE RECEIVED NO GROWTH TO DATE CULTURE WILL BE HELD FOR 5 DAYS BEFORE ISSUING A FINAL NEGATIVE REPORT   Report Status PENDING   Incomplete   CULTURE, BLOOD (ROUTINE X 2)     Status: Normal (Preliminary result)   Collection Time   03/17/12 12:05 PM      Component Value Range Status Comment   Specimen Description BLOOD RIGHT ARM   Final    Special Requests BOTTLES DRAWN AEROBIC AND ANAEROBIC 5CC   Final    Culture  Setup Time  [id number]   Final    Culture     Final    Value:        BLOOD CULTURE RECEIVED NO GROWTH TO DATE CULTURE WILL BE HELD FOR 5 DAYS BEFORE ISSUING A FINAL NEGATIVE REPORT   Report Status PENDING   Incomplete   ANAEROBIC CULTURE     Status: Normal (Preliminary result)   Collection Time   03/17/12  2:49 PM      Component Value Range Status Comment   Specimen Description ABSCESS   Final    Special Requests NONE   Final    Gram Stain     Final    Value: MODERATE WBC PRESENT,BOTH PMN AND MONONUCLEAR     FEW GRAM NEGATIVE RODS     Gram Stain Report Called to,Read Back By and Verified With: Gram Stain Report Called to,Read Back By and Verified With: [id number] Performed at [facility]   Culture     Final    Value: NO ANAEROBES ISOLATED; CULTURE IN PROGRESS FOR 5 DAYS   Report Status PENDING   Incomplete   CULTURE, ROUTINE-ABSCESS     Status: Normal (Preliminary result)   Collection Time   03/17/12  2:49 PM      Component Value Range Status Comment   Specimen  Description ABSCESS   Final    Special Requests NONE   Final    Gram Stain     Final    Value: MODERATE WBC PRESENT,BOTH PMN AND MONONUCLEAR     FEW GRAM NEGATIVE RODS     Gram Stain Report Called to,Read Back By and Verified With: Gram Stain Report Called to,Read Back By and Verified With: [id number] Performed at [facility]   Culture Culture reincubated for better growth   Final    Report Status PENDING   Incomplete   GRAM STAIN     Status: Normal   Collection Time   03/17/12  2:49 PM      Component Value Range Status Comment   Specimen Description ABSCESS   Final    Special Requests NONE   Final    Gram Stain     Final    Value: MODERATE WBC PRESENT,BOTH PMN AND MONONUCLEAR     FEW GRAM NEGATIVE RODS     MODERATE GRAM POSITIVE COCCI     Gram Stain Report Called to,Read Back By and Verified With: [id number]   Report Status 03/17/2012 FINAL   Final     MRSA PCR SCREENING     Status: Normal   Collection Time   03/17/12  5:24 PM      Component Value Range Status Comment   MRSA by PCR NEGATIVE  NEGATIVE  Final     Anti-infectives     Start     Dose/Rate Route Frequency Ordered Stop   03/18/12 0500   vancomycin (VANCOCIN) IVPB 1000 mg/200 mL premix        1,000 mg 200 mL/hr over 60 Minutes Intravenous Every 8 hours 03/17/12 1620     03/17/12 2100  piperacillin-tazobactam (ZOSYN) IVPB 3.375 g       3.375 g 12.5 mL/hr over 240 Minutes Intravenous 3 times per day 03/17/12 1620     03/17/12 1230  piperacillin-tazobactam (ZOSYN) IVPB 3.375 g       3.375 g 100 mL/hr over 30 Minutes Intravenous  Once 03/17/12 1139 03/17/12 1304   03/17/12 1230   vancomycin (VANCOCIN) IVPB 1000 mg/200 mL premix        1,000 mg 200 mL/hr over 60 Minutes Intravenous  Once 03/17/12 1139 03/17/12 1359          Assessment:  41 yo paraplagic male with perirectal abscess. I&D 5/27.  D3 Vancomycin/Zosyn  Abscess culture; few GNR. Gram stain of abscess fluid with GPC, GNR.  WBC decreasing, low grade temp.  Vanc Trough 15.8 mcg/ml on Vancomycin 1gm q8hr.  Goal of Therapy:  Vancomycin trough level 15-20 mcg/ml  Plan:  Continue Vancomycin 1gm q8h/Zosyn 3.375 gm q8h-4 hr infusion. Follow up culture results  Otho Bellows PharmD Pager [phone] 03/19/2012,2:17 PM

## 2012-03-20 ENCOUNTER — Encounter (HOSPITAL_COMMUNITY): Payer: Self-pay | Admitting: General Surgery

## 2012-03-20 ENCOUNTER — Inpatient Hospital Stay
Admission: AD | Admit: 2012-03-20 | Discharge: 2012-04-11 | Disposition: A | Discharge status: Skilled Nursing Facility | Payer: Self-pay | Source: Ambulatory Visit | Attending: Internal Medicine | Admitting: Internal Medicine

## 2012-03-20 DIAGNOSIS — R569 Unspecified convulsions: Secondary | ICD-10-CM | POA: Diagnosis present

## 2012-03-20 DIAGNOSIS — Z87898 Personal history of other specified conditions: Secondary | ICD-10-CM | POA: Diagnosis present

## 2012-03-20 DIAGNOSIS — Z720 Tobacco use: Secondary | ICD-10-CM

## 2012-03-20 DIAGNOSIS — N493 Fournier gangrene: Secondary | ICD-10-CM | POA: Diagnosis present

## 2012-03-20 HISTORY — DX: Tobacco use: Z72.0

## 2012-03-20 MED ORDER — ACETAMINOPHEN 325 MG PO TABS: 650.0000 mg | ORAL_TABLET | Freq: Four times a day (QID) | ORAL | Status: DC | PRN

## 2012-03-20 MED ORDER — PIPERACILLIN-TAZOBACTAM 3.375 G IVPB: 3.3750 g | Freq: Three times a day (TID) | INTRAVENOUS | Status: DC

## 2012-03-20 MED ORDER — MORPHINE SULFATE 4 MG/ML IJ SOLN
3.0000 mg | Freq: Once | INTRAMUSCULAR | Status: AC
  Administered 2012-03-20: 3 mg via INTRAVENOUS
  Filled 2012-03-20: qty 1

## 2012-03-20 MED ORDER — VANCOMYCIN HCL IN DEXTROSE 1-5 GM/200ML-% IV SOLN: 1000.0000 mg | Freq: Three times a day (TID) | INTRAVENOUS | Status: DC

## 2012-03-20 MED ORDER — PANTOPRAZOLE SODIUM 40 MG PO TBEC: 40.0000 mg | DELAYED_RELEASE_TABLET | Freq: Every day | ORAL | Status: DC

## 2012-03-20 MED ORDER — LIDOCAINE-EPINEPHRINE 1 %-1:100000 IJ SOLN
10.0000 mL | Freq: Once | INTRAMUSCULAR | Status: AC
  Administered 2012-03-20: 10 mL via INTRADERMAL
  Filled 2012-03-20: qty 10

## 2012-03-20 NOTE — Discharge Instructions (Signed)
Peri-Rectal Abscess Your caregiver has diagnosed you as having a peri-rectal abscess. This is an infected area near the rectum that is filled with pus. If the abscess is near the surface of the skin, your caregiver may open (incise) the area and drain the pus. HOME CARE INSTRUCTIONS   If your abscess was opened up and drained. A small piece of gauze may be placed in the opening so that it can drain. Do not remove the gauze unless directed by your caregiver.   A loose dressing may be placed over the abscess site. Change the dressing as often as necessary to keep it clean and dry.   After the drain is removed, the area may be washed with a gentle antiseptic (soap) four times per day.   A warm sitz bath, warm packs or heating pad may be used for pain relief, taking care not to burn yourself.   Return for a wound check in 1 day or as directed.   An "inflatable doughnut" may be used for sitting with added comfort. These can be purchased at a drugstore or medical supply house.   To reduce pain and straining with bowel movements, eat a high fiber diet with plenty of fruits and vegetables. Use stool softeners as recommended by your caregiver. This is especially important if narcotic type pain medications were prescribed as these may cause marked constipation.   Only take over-the-counter or prescription medicines for pain, discomfort, or fever as directed by your caregiver.  SEEK IMMEDIATE MEDICAL CARE IF:   You have increasing pain that is not controlled by medication.   There is increased inflammation (redness), swelling, bleeding, or drainage from the area.   An oral temperature above 102 F (38.9 C) develops.   You develop chills or generalized malaise (feel lethargic or feel "washed out").   You develop any new symptoms (problems) you feel may be related to your present problem.  Document Released: 10/05/2000 Document Revised: 09/27/2011 Document Reviewed: 10/05/2008 [employer] Patient  Information 2012 [employer]. 

## 2012-03-20 NOTE — Progress Notes (Signed)
03/20/12 1300  Subjective Assessment  Subjective Ok, go ahead  Patient and Family Stated Goals wound healing  Date of Onset 03/20/12  Prior Treatments surgical debridement  Evaluation and Treatment  Evaluation and Treatment Procedures Explained to Patient/Family Yes  Evaluation and Treatment Procedures agreed to  Wound 03/20/12 Buttocks Left pressure ulcer  Date First Assessed/Time First Assessed: 03/20/12 1030   Location: Buttocks  Location Orientation: Left  Wound Description (Comments): pressure ulcer  Present on Admission: Yes  Site / Wound Assessment Granulation tissue;Yellow  % Wound base Red or Granulating 35%  % Wound base Yellow 65%  Peri-wound Assessment Intact  Wound Length (cm) 10.2 cm  Wound Width (cm) 3.4 cm  Wound Depth (cm) (3 tunnels)  Tunneling (cm) #1 at 12:00 6.4cm(cephalic); #2(middle tunnel) 3.7cm; #3 (lower) 3.7  Margins Unattacted edges (unapproximated)  Closure None  Drainage Amount Moderate  Drainage Description Serous  Non-staged Wound Description Full thickness  Treatment Cleansed;Debridement ([facility]);Hydrotherapy (Pulse lavage);Packing (Saline gauze);Tape changed  Dressing Type Moist to dry  Dressing Changed Changed  Dressing Status Old drainage  Hydrotherapy  Pulsed Lavage with Suction (psi) 8 psi  Pulsed Lavage with Suction - Normal Saline Used 1000 mL  Pulsed Lavage Tip Tip with splash shield  Pulsed lavage therapy - wound location right buttock  Wound Therapy - Assess/Plan/Recommendations  Wound Therapy - Clinical Statement pt will benefit from pulsed lavage to facilitate wound healing  Factors Delaying/Impairing Wound Healing Altered sensation;Incontinence;Multiple medical problems  Hydrotherapy Plan Debridement;Dressing change;Patient/family education;Pulsatile lavage with suction  Wound Therapy - Frequency 6X / week  Wound Therapy - Follow Up Recommendations Skilled nursing facility  Wound Therapy Goals - Improve the function of patient's  integumentary system by progressing the wound(s) through the phases of wound healing by:  Decrease Necrotic Tissue to 50  Decrease Necrotic Tissue - Progress Goal set today  Increase Granulation Tissue to 50  Increase Granulation Tissue - Progress Goal set today  Patient/Family will be able to  verbalize dressing change  Patient/Family Instruction Goal - Progress Goal set today  Goals/treatment plan/discharge plan were made with and agreed upon by patient/family Yes  Time For Goal Achievement 2 weeks

## 2012-03-20 NOTE — Progress Notes (Signed)
3 Days Post-Op  Subjective: No real change from yesterday, you tell him months to heal but he's hearing weeks.  He's having stools almost instantly after eating not being able to get to BR in time.  His feeling back there is variable.  i showed him a picture of his wound.  Objective: Vital signs in last 24 hours: Temp:  [97.2 F (36.2 C)-98.2 F (36.8 C)] 97.2 F (36.2 C) (05/30 0601) Pulse Rate:  [61-77] 61  (05/30 0601) Resp:  [18-20] 18  (05/30 0601) BP: (111-120)/(74) 111/74 mmHg (05/30 0601) SpO2:  [99 %] 99 % (05/30 0601) Last BM Date: 03/19/12  Afebrile, VSS,   Intake/Output from previous day: 05/29 0701 - 05/30 0700 In: 1765 [P.O.:570; I.V.:1195] Out: 2901 [Urine:2900; Stool:1] Intake/Output this shift: Total I/O In: 120 [P.O.:120] Out: 1 [Stool:1]  General appearance: alert, cooperative and no distress Incision/Wound: Open pretty clean right now,   Lab Results:   Basename 03/19/12 0346 03/18/12 0345  WBC 18.7* 26.3*  HGB 12.2* 11.8*  HCT 36.3* 34.3*  PLT 280 309    BMET  Basename 03/18/12 0345 03/17/12 1155  NA 135 133*  K 3.8 4.1  CL 100 95*  CO2 28 23  GLUCOSE 81 83  BUN 14 19  CREATININE 0.66 0.87  CALCIUM 7.6* 7.9*   PT/INR No results found for this basename: LABPROT:2,INR:2 in the last 72 hours   Lab 03/17/12 1155  AST 20  ALT 10  ALKPHOS 84  BILITOT 0.5  PROT 6.7  ALBUMIN 2.0*     Lipase  No results found for this basename: lipase     Studies/Results: No results found.  Medications:    . antiseptic oral rinse  15 mL Mouth Rinse q12n4p  . chlorhexidine  15 mL Mouth Rinse BID  . divalproex  500 mg Oral Q12H  . feeding supplement  30 mL Oral BID WC  . heparin  5,000 Units Subcutaneous Q8H  . pantoprazole (PROTONIX) IV  40 mg Intravenous QHS  . piperacillin-tazobactam (ZOSYN)  IV  3.375 g Intravenous Q8H  . pneumococcal 23 valent vaccine  0.5 mL Intramuscular Tomorrow-1000  . vancomycin  1,000 mg Intravenous Q8H     Assessment/Plan Perirectal abscess/Fournier's gangrene - I&D - 03/17/2012 by Dr. Darnelle Spangle  On Zosyn/Vanc.   C5 paraplegia  Smokes cigarettes  History of seizures.  DVT proph - SQ heparin  Plan:  Continue antibiotics and wound care. Culture final is pending, GM negative rods on Gram stain. Case management is working on SNF OR LTAC for patient.   LOS: 3 days   [name],WILLARD 03/20/2012  Looked at wound with Will.  It looks good.  There is tunneling that goes cranial - will plan to extend incision.  Possible LTAC placement okay with surgery.  Ovidio Kin, MD, [facility] Pager: [phone] Office phone:  [phone]

## 2012-03-20 NOTE — Progress Notes (Signed)
Patient is being transported to [facility], vital signs are stable, report given to [facility], I&D performed at bedside by PA patient tolerated well, last BM was today 03/20/12, urine output adequate, medication returned back to patient prior to [facility] to [facility], Myrtie Hawk RN 03-20-12 17:25pm

## 2012-03-20 NOTE — Discharge Summary (Signed)
Physician Discharge Summary  Patient ID: [name] MRN: [id number] DOB/AGE: 1971-09-15 41 y.o.  Admit date: 03/17/2012 Discharge date: 03/20/2012  Admission Diagnoses: Fournier's gangrene CT 7 [place] B. quadriparesis Smokes cigarettes  History of seizures.    Discharge Diagnoses: SAME Principal Problem:  *Fourniers gangrene Active Problems:  Seizures  Tobacco use C5 Paraplegia  PROCEDURES: #1 incision and drainage of perirectal abscess and perineal abscess; #2 debridement of skin and fatty tissue 03/17/12 Dr. Marta Lamas Course: This is a 41 year old male with a history of C5 paraplegia last Wednesday began having some perirectal swelling. This over that time has now extended into his perineum where he is having perineal and scrotal swelling. He has begun having fevers as well as chills. He was brought to the emergency room by his family. On evaluation he had a tender perirectal abscess draining purulence that was extending towards his scrotum anteriorly concerning for Fournier's gangrene. He also was tachycardic with a white blood cell count of 34,000. I discussed an emergent trip to the operating room for incision and drainage of this abscess as well as likely debridement. We discussed the possibility of further operations in the future. Pt was taken to the OR and underwent the procedures noted.  He has been undergoing wet to dry dressing and hydro therapy.  The area is close enough to the rectum that it becomes soiled easily.  He normally live on his own, he self stimulates himself in order to have bowel movements.  Unfortunately he is leaking allot of liquid fecal material now with flatus which also keeps this area soiled.  Culture has not finalized and he is being treated with Vancomycin and Zosyn.  Hydro therapy, wet to dry dressing with Normal saline.  I would suggest continuing dressing changes at least BID.  They will need to be more frequent if the drainage  increases.  We have extended his incision about 2 cm cranially today (03/20/2012) using local and it is packed dry right now.  He has been accepted to [facility] for ongoing care.  HE does not have enough help at home to do this on his own.  [facility] [place] [facility] can follow him in the LTAC at your request.  He can follow up with Dr. Dwain Sarna as needed after discharge from [facility]. He also does intermittent urinary catherzation at home he is currently using a condom cath.  Condition on discharge:  stable  CBC    Component Value Date/Time   WBC 18.7* 03/19/2012 0346   RBC 3.95* 03/19/2012 0346   HGB 12.2* 03/19/2012 0346   HCT 36.3* 03/19/2012 0346   PLT 280 03/19/2012 0346   MCV 91.9 03/19/2012 0346   MCH 30.9 03/19/2012 0346   MCHC 33.6 03/19/2012 0346   RDW 13.4 03/19/2012 0346   LYMPHSABS 1.9 03/19/2012 0346   MONOABS 1.4* 03/19/2012 0346   EOSABS 0.7 03/19/2012 0346   BASOSABS 0.0 03/19/2012 0346   CMP     Component Value Date/Time   NA 135 03/18/2012 0345   K 3.8 03/18/2012 0345   CL 100 03/18/2012 0345   CO2 28 03/18/2012 0345   GLUCOSE 81 03/18/2012 0345   BUN 14 03/18/2012 0345   CREATININE 0.66 03/18/2012 0345   CALCIUM 7.6* 03/18/2012 0345   PROT 6.7 03/17/2012 1155   ALBUMIN 2.0* 03/17/2012 1155   AST 20 03/17/2012 1155   ALT 10 03/17/2012 1155   ALKPHOS 84 03/17/2012 1155   BILITOT 0.5 03/17/2012 1155   GFRNONAA >  90 03/18/2012 0345   GFRAA >90 03/18/2012 0345    basic metabolic panel   Disposition: 16-XWRU or Self Care  Discharge Orders    Future Appointments: Provider: Department: Dept Phone: Center:   08/28/2012 9:30 AM Erick Colace, MD [facility] [phone] None     Medication List  As of 03/20/2012  3:23 PM   STOP taking these medications         ciprofloxacin 500 MG tablet         TAKE these medications         acetaminophen 325 MG tablet   Commonly known as: TYLENOL   Take 2 tablets (650 mg total) by mouth every 6 (six) hours as needed (or Temp > 100).       divalproex 500 MG 24 hr tablet   Commonly known as: DEPAKOTE ER   Take 500 mg by mouth 2 (two) times daily. Patient uses brand name medication, desires to use own medication, await MD order      piperacillin-tazobactam 3-0.375 GM/50ML IVPB   Commonly known as: ZOSYN   Inject 50 mLs (3.375 g total) into the vein every 8 (eight) hours.      vancomycin 1 GM/200ML Soln   Commonly known as: VANCOCIN   Inject 200 mLs (1,000 mg total) into the vein every 8 (eight) hours.           Follow-up Information    Follow up with [facility], MD. Schedule an appointment as soon as possible for a visit in 2 weeks. (After discharge from Long Term Care Unit)    Contact information:   [facility] [address] [place] [address] [phone]       Please follow up. (Care will be assumed by Long Term  Acute Care )         Signed: Sherrie George 03/20/2012, 3:23 PM  Wound is improving.  Ovidio Kin, MD, [facility] Pager: [phone] Office phone:  [phone]

## 2012-03-20 NOTE — Anesthesia Postprocedure Evaluation (Signed)
  Anesthesia Post-op Note  Patient: [name]  Procedure(s) Performed: Procedure(s) (LRB): INCISION AND DRAINAGE ABSCESS (N/A)  Patient Location: PACU  Anesthesia Type: General  Level of Consciousness: awake and alert   Airway and Oxygen Therapy: Patient Spontanous Breathing  Post-op Pain: mild  Post-op Assessment: Post-op Vital signs reviewed, Patient's Cardiovascular Status Stable, Respiratory Function Stable, Patent Airway and No signs of Nausea or vomiting  Post-op Vital Signs: stable  Complications: No apparent anesthesia complications

## 2012-03-20 NOTE — Progress Notes (Signed)
The patient is receiving Protonix by the intravenous route.  Based on criteria approved by the Pharmacy and Therapeutics Committee and the Medical Executive Committee, the medication is being converted to the equivalent oral dose form.  These criteria include: -No Active GI bleeding -Able to tolerate diet of full liquids (or better) or tube feeding -Able to tolerate other medications by the oral or enteral route  If you have any questions about this conversion, please contact the Pharmacy Department (ext [phone]).  Thank you.  Reece Packer, [facility] 03/20/2012 9:04 AM

## 2012-03-20 NOTE — Progress Notes (Deleted)
03/20/12 1300  Subjective Assessment  Subjective Ok, go ahead  Patient and Family Stated Goals wound healing  Date of Onset 03/20/12  Prior Treatments surgical debridement  Evaluation and Treatment  Evaluation and Treatment Procedures Explained to Patient/Family Yes  Evaluation and Treatment Procedures agreed to  Wound 03/20/12 Buttocks Left pressure ulcer  Date First Assessed/Time First Assessed: 03/20/12 1030   Location: Buttocks  Location Orientation: Left  Wound Description (Comments): pressure ulcer  Present on Admission: Yes  Site / Wound Assessment Granulation tissue;Yellow  % Wound base Red or Granulating 35%  % Wound base Yellow 65%  Peri-wound Assessment Intact  Wound Length (cm) 10.2 cm  Wound Width (cm) 3.4 cm  Wound Depth (cm) (3 tunnels)  Tunneling (cm) #1 at 12:00 6.4cm(cephalic); #2(middle tunnel) 3.7cm; #3 (lower) 3.7  Margins Unattacted edges (unapproximated)  Closure None  Drainage Amount Moderate  Drainage Description Serous  Non-staged Wound Description Full thickness  Treatment Cleansed;Debridement ([facility]);Hydrotherapy (Pulse lavage);Packing (Saline gauze);Tape changed  Dressing Type Moist to dry  Dressing Changed Changed  Dressing Status Old drainage  Hydrotherapy  Pulsed Lavage with Suction (psi) 8 psi  Pulsed Lavage with Suction - Normal Saline Used 1000 mL  Pulsed Lavage Tip Tip with splash shield  Pulsed lavage therapy - wound location right buttock  Wound Therapy - Assess/Plan/Recommendations  Wound Therapy - Clinical Statement pt will benefit from pulsed lavage to facilitate wound healing  Factors Delaying/Impairing Wound Healing Altered sensation;Incontinence;Multiple medical problems  Hydrotherapy Plan Debridement;Dressing change;Patient/family education;Pulsatile lavage with suction  Wound Therapy - Frequency 6X / week  Wound Therapy - Follow Up Recommendations Skilled nursing facility  Wound Therapy Goals - Improve the function of patient's  integumentary system by progressing the wound(s) through the phases of wound healing by:  Decrease Necrotic Tissue to 50  Decrease Necrotic Tissue - Progress Goal set today  Increase Granulation Tissue to 50  Increase Granulation Tissue - Progress Goal set today  Patient/Family will be able to  verbalize dressing change  Patient/Family Instruction Goal - Progress Goal set today  Goals/treatment plan/discharge plan were made with and agreed upon by patient/family Yes  Time For Goal Achievement 2 weeks    

## 2012-03-20 NOTE — Progress Notes (Signed)
Spoke with patient and mother at bedside. Offered patient choice for LTACH between [facility] and [facility] locally, provided written information for patient to review and discuss with his mother. Patient also spoke with Boneta Lucks, [facility] rep to further answer questions. Patient chose [facility]. Boneta Lucks states patient able to transfer today if okay with Surgery. Spoke with Betha Loa PA, plan is for another debridement at bedside this pm then likely able to transfer at that point. Will continue to follow and await transfer orders. RN aware of plans.

## 2012-03-21 ENCOUNTER — Other Ambulatory Visit: Payer: Self-pay

## 2012-03-21 LAB — CBC
HCT: 39.1 % (ref 39.0–52.0)
Hemoglobin: 13.3 g/dL (ref 13.0–17.0)
MCH: 31.1 pg (ref 26.0–34.0)
MCHC: 34 g/dL (ref 30.0–36.0)
MCV: 91.4 fL (ref 78.0–100.0)
Platelets: 354 10*3/uL (ref 150–400)
RBC: 4.28 MIL/uL (ref 4.22–5.81)
RDW: 13.5 % (ref 11.5–15.5)
WBC: 10.9 10*3/uL — ABNORMAL HIGH (ref 4.0–10.5)

## 2012-03-21 LAB — COMPREHENSIVE METABOLIC PANEL
ALT: 10 U/L (ref 0–53)
AST: 21 U/L (ref 0–37)
Albumin: 1.7 g/dL — ABNORMAL LOW (ref 3.5–5.2)
Alkaline Phosphatase: 67 U/L (ref 39–117)
BUN: 9 mg/dL (ref 6–23)
CO2: 31 mEq/L (ref 19–32)
Calcium: 8.6 mg/dL (ref 8.4–10.5)
Chloride: 101 mEq/L (ref 96–112)
Creatinine, Ser: 0.66 mg/dL (ref 0.50–1.35)
GFR calc Af Amer: >90 mL/min (ref >90)
GFR calc non Af Amer: >90 mL/min (ref >90)
Glucose, Bld: 82 mg/dL (ref 70–99)
Potassium: 3.6 mEq/L (ref 3.5–5.1)
Sodium: 141 mEq/L (ref 135–145)
Total Bilirubin: 0.3 mg/dL (ref 0.3–1.2)
Total Protein: 6.4 g/dL (ref 6.0–8.3)

## 2012-03-21 LAB — CULTURE, ROUTINE-ABSCESS

## 2012-03-21 LAB — PHOSPHORUS: Phosphorus: 3 mg/dL (ref 2.3–4.6)

## 2012-03-21 LAB — MAGNESIUM: Magnesium: 1.9 mg/dL (ref 1.5–2.5)

## 2012-03-21 LAB — PROTIME-INR
INR: 1.08 (ref 0.00–1.49)
Prothrombin Time: 14.2 seconds (ref 11.6–15.2)

## 2012-03-23 LAB — CBC
HCT: 36.4 % — ABNORMAL LOW (ref 39.0–52.0)
Hemoglobin: 12.4 g/dL — ABNORMAL LOW (ref 13.0–17.0)
MCH: 31.1 pg (ref 26.0–34.0)
MCHC: 34.1 g/dL (ref 30.0–36.0)
MCV: 91.2 fL (ref 78.0–100.0)
Platelets: 359 10*3/uL (ref 150–400)
RBC: 3.99 MIL/uL — ABNORMAL LOW (ref 4.22–5.81)
RDW: 13.7 % (ref 11.5–15.5)
WBC: 9.4 10*3/uL (ref 4.0–10.5)

## 2012-03-23 LAB — CULTURE, BLOOD (ROUTINE X 2)
Culture  Setup Time: 201305271708
Culture  Setup Time: 201305271708
Culture: NO GROWTH
Culture: NO GROWTH

## 2012-03-23 LAB — BASIC METABOLIC PANEL
BUN: 10 mg/dL (ref 6–23)
CO2: 27 mEq/L (ref 19–32)
Calcium: 8.4 mg/dL (ref 8.4–10.5)
Chloride: 105 mEq/L (ref 96–112)
Creatinine, Ser: 0.66 mg/dL (ref 0.50–1.35)
GFR calc Af Amer: >90 mL/min (ref >90)
GFR calc non Af Amer: >90 mL/min (ref >90)
Glucose, Bld: 121 mg/dL — ABNORMAL HIGH (ref 70–99)
Potassium: 3.8 mEq/L (ref 3.5–5.1)
Sodium: 142 mEq/L (ref 135–145)

## 2012-03-23 LAB — ANAEROBIC CULTURE

## 2012-03-23 LAB — VANCOMYCIN, TROUGH: Vancomycin Tr: 24.3 ug/mL — ABNORMAL HIGH (ref 10.0–20.0)

## 2012-03-25 LAB — CBC
HCT: 36.2 % — ABNORMAL LOW (ref 39.0–52.0)
Hemoglobin: 12.4 g/dL — ABNORMAL LOW (ref 13.0–17.0)
MCH: 31.2 pg (ref 26.0–34.0)
MCHC: 34.3 g/dL (ref 30.0–36.0)
MCV: 91.2 fL (ref 78.0–100.0)
Platelets: 374 10*3/uL (ref 150–400)
RBC: 3.97 MIL/uL — ABNORMAL LOW (ref 4.22–5.81)
RDW: 14.2 % (ref 11.5–15.5)
WBC: 12 10*3/uL — ABNORMAL HIGH (ref 4.0–10.5)

## 2012-03-25 LAB — BASIC METABOLIC PANEL
BUN: 8 mg/dL (ref 6–23)
CO2: 29 mEq/L (ref 19–32)
Calcium: 8.7 mg/dL (ref 8.4–10.5)
Chloride: 104 mEq/L (ref 96–112)
Creatinine, Ser: 0.75 mg/dL (ref 0.50–1.35)
GFR calc Af Amer: >90 mL/min (ref >90)
GFR calc non Af Amer: >90 mL/min (ref >90)
Glucose, Bld: 79 mg/dL (ref 70–99)
Potassium: 4.3 mEq/L (ref 3.5–5.1)
Sodium: 142 mEq/L (ref 135–145)

## 2012-03-26 LAB — BASIC METABOLIC PANEL
BUN: 12 mg/dL (ref 6–23)
CO2: 29 mEq/L (ref 19–32)
Calcium: 9.1 mg/dL (ref 8.4–10.5)
Chloride: 104 mEq/L (ref 96–112)
Creatinine, Ser: 0.85 mg/dL (ref 0.50–1.35)
GFR calc Af Amer: >90 mL/min (ref >90)
GFR calc non Af Amer: >90 mL/min (ref >90)
Glucose, Bld: 83 mg/dL (ref 70–99)
Potassium: 4.4 mEq/L (ref 3.5–5.1)
Sodium: 142 mEq/L (ref 135–145)

## 2012-03-26 LAB — CBC
HCT: 38.9 % — ABNORMAL LOW (ref 39.0–52.0)
Hemoglobin: 13 g/dL (ref 13.0–17.0)
MCH: 30.9 pg (ref 26.0–34.0)
MCHC: 33.4 g/dL (ref 30.0–36.0)
MCV: 92.4 fL (ref 78.0–100.0)
Platelets: 385 10*3/uL (ref 150–400)
RBC: 4.21 MIL/uL — ABNORMAL LOW (ref 4.22–5.81)
RDW: 14.7 % (ref 11.5–15.5)
WBC: 11.5 10*3/uL — ABNORMAL HIGH (ref 4.0–10.5)

## 2012-03-27 LAB — CBC
HCT: 35.6 % — ABNORMAL LOW (ref 39.0–52.0)
Hemoglobin: 11.9 g/dL — ABNORMAL LOW (ref 13.0–17.0)
MCH: 31.1 pg (ref 26.0–34.0)
MCHC: 33.4 g/dL (ref 30.0–36.0)
MCV: 93 fL (ref 78.0–100.0)
Platelets: 329 10*3/uL (ref 150–400)
RBC: 3.83 MIL/uL — ABNORMAL LOW (ref 4.22–5.81)
RDW: 15 % (ref 11.5–15.5)
WBC: 11.6 10*3/uL — ABNORMAL HIGH (ref 4.0–10.5)

## 2012-03-27 LAB — BASIC METABOLIC PANEL
BUN: 14 mg/dL (ref 6–23)
CO2: 28 mEq/L (ref 19–32)
Calcium: 8.7 mg/dL (ref 8.4–10.5)
Chloride: 104 mEq/L (ref 96–112)
Creatinine, Ser: 0.81 mg/dL (ref 0.50–1.35)
GFR calc Af Amer: >90 mL/min (ref >90)
GFR calc non Af Amer: >90 mL/min (ref >90)
Glucose, Bld: 90 mg/dL (ref 70–99)
Potassium: 4.1 mEq/L (ref 3.5–5.1)
Sodium: 139 mEq/L (ref 135–145)

## 2012-03-28 ENCOUNTER — Other Ambulatory Visit (HOSPITAL_COMMUNITY): Payer: Self-pay

## 2012-03-28 LAB — CBC
HCT: 37.1 % — ABNORMAL LOW (ref 39.0–52.0)
Hemoglobin: 12.4 g/dL — ABNORMAL LOW (ref 13.0–17.0)
MCH: 31 pg (ref 26.0–34.0)
MCHC: 33.4 g/dL (ref 30.0–36.0)
MCV: 92.8 fL (ref 78.0–100.0)
Platelets: 328 10*3/uL (ref 150–400)
RBC: 4 MIL/uL — ABNORMAL LOW (ref 4.22–5.81)
RDW: 15 % (ref 11.5–15.5)
WBC: 11.7 10*3/uL — ABNORMAL HIGH (ref 4.0–10.5)

## 2012-03-28 LAB — SEDIMENTATION RATE: Sed Rate: 57 mm/hr — ABNORMAL HIGH (ref 0–16)

## 2012-03-28 LAB — BASIC METABOLIC PANEL
BUN: 12 mg/dL (ref 6–23)
CO2: 29 mEq/L (ref 19–32)
Calcium: 9.1 mg/dL (ref 8.4–10.5)
Chloride: 105 mEq/L (ref 96–112)
Creatinine, Ser: 0.83 mg/dL (ref 0.50–1.35)
GFR calc Af Amer: >90 mL/min (ref >90)
GFR calc non Af Amer: >90 mL/min (ref >90)
Glucose, Bld: 104 mg/dL — ABNORMAL HIGH (ref 70–99)
Potassium: 4.5 mEq/L (ref 3.5–5.1)
Sodium: 142 mEq/L (ref 135–145)

## 2012-03-31 LAB — VANCOMYCIN, TROUGH: Vancomycin Tr: 46.8 ug/mL (ref 10.0–20.0)

## 2012-03-31 LAB — BASIC METABOLIC PANEL
BUN: 11 mg/dL (ref 6–23)
CO2: 31 mEq/L (ref 19–32)
Calcium: 9 mg/dL (ref 8.4–10.5)
Chloride: 100 mEq/L (ref 96–112)
Creatinine, Ser: 1.05 mg/dL (ref 0.50–1.35)
GFR calc Af Amer: >90 mL/min (ref >90)
GFR calc non Af Amer: 87 mL/min — ABNORMAL LOW (ref >90)
Glucose, Bld: 91 mg/dL (ref 70–99)
Potassium: 4.1 mEq/L (ref 3.5–5.1)
Sodium: 139 mEq/L (ref 135–145)

## 2012-03-31 LAB — CBC
HCT: 34.1 % — ABNORMAL LOW (ref 39.0–52.0)
Hemoglobin: 11.3 g/dL — ABNORMAL LOW (ref 13.0–17.0)
MCH: 30.9 pg (ref 26.0–34.0)
MCHC: 33.1 g/dL (ref 30.0–36.0)
MCV: 93.2 fL (ref 78.0–100.0)
Platelets: 259 10*3/uL (ref 150–400)
RBC: 3.66 MIL/uL — ABNORMAL LOW (ref 4.22–5.81)
RDW: 15.1 % (ref 11.5–15.5)
WBC: 9.1 10*3/uL (ref 4.0–10.5)

## 2012-03-31 LAB — SEDIMENTATION RATE: Sed Rate: 77 mm/hr — ABNORMAL HIGH (ref 0–16)

## 2012-04-01 LAB — VANCOMYCIN, TROUGH: Vancomycin Tr: 19.1 ug/mL (ref 10.0–20.0)

## 2012-04-03 LAB — CREATININE, SERUM
Creatinine, Ser: 1.68 mg/dL — ABNORMAL HIGH (ref 0.50–1.35)
GFR calc Af Amer: 57 mL/min — ABNORMAL LOW (ref >90)
GFR calc non Af Amer: 49 mL/min — ABNORMAL LOW (ref >90)

## 2012-04-04 LAB — DIFFERENTIAL
Basophils Absolute: 0 10*3/uL (ref 0.0–0.1)
Basophils Relative: 0 % (ref 0–1)
Eosinophils Absolute: 0.1 10*3/uL (ref 0.0–0.7)
Eosinophils Relative: 1 % (ref 0–5)
Lymphocytes Relative: 18 % (ref 12–46)
Lymphs Abs: 1.6 10*3/uL (ref 0.7–4.0)
Monocytes Absolute: 1.2 10*3/uL — ABNORMAL HIGH (ref 0.1–1.0)
Monocytes Relative: 13 % — ABNORMAL HIGH (ref 3–12)
Neutro Abs: 6.1 10*3/uL (ref 1.7–7.7)
Neutrophils Relative %: 68 % (ref 43–77)

## 2012-04-04 LAB — CBC
HCT: 32.3 % — ABNORMAL LOW (ref 39.0–52.0)
Hemoglobin: 10.7 g/dL — ABNORMAL LOW (ref 13.0–17.0)
MCH: 30.7 pg (ref 26.0–34.0)
MCHC: 33.1 g/dL (ref 30.0–36.0)
MCV: 92.8 fL (ref 78.0–100.0)
Platelets: 253 10*3/uL (ref 150–400)
RBC: 3.48 MIL/uL — ABNORMAL LOW (ref 4.22–5.81)
RDW: 14.8 % (ref 11.5–15.5)
WBC: 9 10*3/uL (ref 4.0–10.5)

## 2012-04-04 LAB — BASIC METABOLIC PANEL
BUN: 24 mg/dL — ABNORMAL HIGH (ref 6–23)
CO2: 28 mEq/L (ref 19–32)
Calcium: 9.4 mg/dL (ref 8.4–10.5)
Chloride: 102 mEq/L (ref 96–112)
Creatinine, Ser: 1.71 mg/dL — ABNORMAL HIGH (ref 0.50–1.35)
GFR calc Af Amer: 56 mL/min — ABNORMAL LOW (ref >90)
GFR calc non Af Amer: 48 mL/min — ABNORMAL LOW (ref >90)
Glucose, Bld: 87 mg/dL (ref 70–99)
Potassium: 4.2 mEq/L (ref 3.5–5.1)
Sodium: 141 mEq/L (ref 135–145)

## 2012-04-05 LAB — BASIC METABOLIC PANEL
BUN: 24 mg/dL — ABNORMAL HIGH (ref 6–23)
CO2: 30 mEq/L (ref 19–32)
Calcium: 9 mg/dL (ref 8.4–10.5)
Chloride: 103 mEq/L (ref 96–112)
Creatinine, Ser: 1.55 mg/dL — ABNORMAL HIGH (ref 0.50–1.35)
GFR calc Af Amer: 63 mL/min — ABNORMAL LOW (ref >90)
GFR calc non Af Amer: 54 mL/min — ABNORMAL LOW (ref >90)
Glucose, Bld: 86 mg/dL (ref 70–99)
Potassium: 4 mEq/L (ref 3.5–5.1)
Sodium: 143 mEq/L (ref 135–145)

## 2012-04-07 LAB — BASIC METABOLIC PANEL
BUN: 21 mg/dL (ref 6–23)
CO2: 28 mEq/L (ref 19–32)
Calcium: 9.4 mg/dL (ref 8.4–10.5)
Chloride: 99 mEq/L (ref 96–112)
Creatinine, Ser: 1.56 mg/dL — ABNORMAL HIGH (ref 0.50–1.35)
GFR calc Af Amer: 63 mL/min — ABNORMAL LOW (ref >90)
GFR calc non Af Amer: 54 mL/min — ABNORMAL LOW (ref >90)
Glucose, Bld: 83 mg/dL (ref 70–99)
Potassium: 4.7 mEq/L (ref 3.5–5.1)
Sodium: 137 mEq/L (ref 135–145)

## 2012-04-07 LAB — CBC
HCT: 32.7 % — ABNORMAL LOW (ref 39.0–52.0)
Hemoglobin: 10.7 g/dL — ABNORMAL LOW (ref 13.0–17.0)
MCH: 30.6 pg (ref 26.0–34.0)
MCHC: 32.7 g/dL (ref 30.0–36.0)
MCV: 93.4 fL (ref 78.0–100.0)
Platelets: 275 10*3/uL (ref 150–400)
RBC: 3.5 MIL/uL — ABNORMAL LOW (ref 4.22–5.81)
RDW: 14.4 % (ref 11.5–15.5)
WBC: 5.7 10*3/uL (ref 4.0–10.5)

## 2012-04-08 LAB — URINALYSIS, ROUTINE W REFLEX MICROSCOPIC
Bilirubin Urine: NEGATIVE
Glucose, UA: NEGATIVE mg/dL
Hgb urine dipstick: NEGATIVE
Ketones, ur: NEGATIVE mg/dL
Nitrite: NEGATIVE
Protein, ur: NEGATIVE mg/dL
Specific Gravity, Urine: 1.011 (ref 1.005–1.030)
Urobilinogen, UA: 1 mg/dL (ref 0.0–1.0)
pH: 8 (ref 5.0–8.0)

## 2012-04-08 LAB — URINE MICROSCOPIC-ADD ON

## 2012-04-09 LAB — DIFFERENTIAL
Basophils Absolute: 0.1 10*3/uL (ref 0.0–0.1)
Basophils Relative: 1 % (ref 0–1)
Eosinophils Absolute: 0.3 10*3/uL (ref 0.0–0.7)
Eosinophils Relative: 5 % (ref 0–5)
Lymphocytes Relative: 31 % (ref 12–46)
Lymphs Abs: 1.8 10*3/uL (ref 0.7–4.0)
Monocytes Absolute: 0.4 10*3/uL (ref 0.1–1.0)
Monocytes Relative: 7 % (ref 3–12)
Neutro Abs: 3.3 10*3/uL (ref 1.7–7.7)
Neutrophils Relative %: 56 % (ref 43–77)

## 2012-04-09 LAB — BASIC METABOLIC PANEL
BUN: 27 mg/dL — ABNORMAL HIGH (ref 6–23)
CO2: 28 mEq/L (ref 19–32)
Calcium: 9.4 mg/dL (ref 8.4–10.5)
Chloride: 102 mEq/L (ref 96–112)
Creatinine, Ser: 1.55 mg/dL — ABNORMAL HIGH (ref 0.50–1.35)
GFR calc Af Amer: 63 mL/min — ABNORMAL LOW (ref >90)
GFR calc non Af Amer: 54 mL/min — ABNORMAL LOW (ref >90)
Glucose, Bld: 80 mg/dL (ref 70–99)
Potassium: 4.5 mEq/L (ref 3.5–5.1)
Sodium: 139 mEq/L (ref 135–145)

## 2012-04-09 LAB — CBC
HCT: 31.5 % — ABNORMAL LOW (ref 39.0–52.0)
Hemoglobin: 10.6 g/dL — ABNORMAL LOW (ref 13.0–17.0)
MCH: 31.1 pg (ref 26.0–34.0)
MCHC: 33.7 g/dL (ref 30.0–36.0)
MCV: 92.4 fL (ref 78.0–100.0)
Platelets: 252 10*3/uL (ref 150–400)
RBC: 3.41 MIL/uL — ABNORMAL LOW (ref 4.22–5.81)
RDW: 14.2 % (ref 11.5–15.5)
WBC: 5.8 10*3/uL (ref 4.0–10.5)

## 2012-04-09 LAB — VANCOMYCIN, TROUGH: Vancomycin Tr: 10.4 ug/mL (ref 10.0–20.0)

## 2012-04-09 LAB — PHOSPHORUS: Phosphorus: 3.8 mg/dL (ref 2.3–4.6)

## 2012-04-09 LAB — MAGNESIUM: Magnesium: 1.9 mg/dL (ref 1.5–2.5)

## 2012-04-10 LAB — BASIC METABOLIC PANEL
BUN: 31 mg/dL — ABNORMAL HIGH (ref 6–23)
CO2: 27 mEq/L (ref 19–32)
Calcium: 9.4 mg/dL (ref 8.4–10.5)
Chloride: 102 mEq/L (ref 96–112)
Creatinine, Ser: 1.68 mg/dL — ABNORMAL HIGH (ref 0.50–1.35)
GFR calc Af Amer: 57 mL/min — ABNORMAL LOW (ref >90)
GFR calc non Af Amer: 49 mL/min — ABNORMAL LOW (ref >90)
Glucose, Bld: 85 mg/dL (ref 70–99)
Potassium: 5 mEq/L (ref 3.5–5.1)
Sodium: 139 mEq/L (ref 135–145)

## 2012-04-29 ENCOUNTER — Telehealth: Payer: Self-pay | Admitting: Physical Medicine & Rehabilitation

## 2012-04-29 NOTE — Telephone Encounter (Signed)
Pt is going to contact PCP.

## 2012-04-29 NOTE — Telephone Encounter (Signed)
Patient just had surgery.  [employer] needs list of medical products that he will need on a daily basis.  Please call.

## 2012-04-30 ENCOUNTER — Telehealth: Payer: Self-pay | Admitting: Physical Medicine & Rehabilitation

## 2012-04-30 NOTE — Telephone Encounter (Signed)
Patient admitted to HH.

## 2012-05-29 ENCOUNTER — Telehealth: Payer: Self-pay | Admitting: Physical Medicine & Rehabilitation

## 2012-05-29 ENCOUNTER — Ambulatory Visit (INDEPENDENT_AMBULATORY_CARE_PROVIDER_SITE_OTHER): Payer: Medicare Other | Admitting: General Surgery

## 2012-05-29 ENCOUNTER — Encounter (INDEPENDENT_AMBULATORY_CARE_PROVIDER_SITE_OTHER): Payer: Self-pay | Admitting: General Surgery

## 2012-05-29 VITALS — BP 112/76 | HR 92 | Temp 98.0°F | Resp 16 | Ht 72.0 in | Wt 150.0 lb

## 2012-05-29 DIAGNOSIS — Z09 Encounter for follow-up examination after completed treatment for conditions other than malignant neoplasm: Secondary | ICD-10-CM

## 2012-05-29 DIAGNOSIS — N501 Vascular disorders of male genital organs: Secondary | ICD-10-CM

## 2012-05-29 DIAGNOSIS — N493 Fournier gangrene: Secondary | ICD-10-CM

## 2012-05-29 NOTE — Progress Notes (Signed)
Subjective:     Patient ID: [name], male   DOB: October 27, 1970, 41 y.o.   MRN: [id number]  HPI This is a 41 year old male who has a paraplegic who presented when I was the on-call doctor Wonda Olds with a perirectal abscess that had formed and  Fourniers gangrene. He had an elevated white blood cell count as well as he was septic at that time. I took him to the operating room and did an  incision and drainage as well as a debridement of this entire area. He then she was discharged from the hospital and spent some time in rehabilitation. He returns today for a wound check. He is really doing pretty well at this point. He is eating and having bowel movements fairly regularly. His bowel movements are assisted due to his paraplegia. His wound is healed but there is still about 8 x 3 cm area that is opened just to the right side of his rectum where this abscess was right on his rectum. It is very clean. He has no fevers.  Review of Systems     Objective:   Physical Exam He has open wound about 8x3cm to right of rectum that is about 1.5 cm deep.  There is pink granulation tissue and this is very clean    Assessment:     fourniers gangrene s/p debridement    Plan:     This wound is clean right now. It is healing. I would continue daily dressing changes. He's been told that this area should be closed up. There is no role to close this area at all. He also needs to stop smoking. I will plan on seeing him back in a month to follow the progress of his wound.

## 2012-05-29 NOTE — Telephone Encounter (Signed)
D/C'd patient today.  Family independent with wound care.  Almost healed.  Low grade fever 99.4.  FYI

## 2012-06-24 ENCOUNTER — Encounter (INDEPENDENT_AMBULATORY_CARE_PROVIDER_SITE_OTHER): Payer: Self-pay | Admitting: General Surgery

## 2012-06-24 ENCOUNTER — Ambulatory Visit (INDEPENDENT_AMBULATORY_CARE_PROVIDER_SITE_OTHER): Payer: Medicare Other | Admitting: General Surgery

## 2012-06-24 VITALS — BP 132/68 | HR 88 | Resp 12 | Ht 74.0 in | Wt 160.0 lb

## 2012-06-24 DIAGNOSIS — Z09 Encounter for follow-up examination after completed treatment for conditions other than malignant neoplasm: Secondary | ICD-10-CM

## 2012-06-24 NOTE — Progress Notes (Signed)
Subjective:     Patient ID: [name], male   DOB: [birth year], 41 y.o.   MRN: [id number]  HPI This is a 41 year old male who has a paraplegic who presented when I was the on-call doctor Wonda Olds with a perirectal abscess that had formed and Fourniers gangrene. He had an elevated white blood cell count as well as he was septic at that time. I took him to the operating room and did an incision and drainage as well as a debridement of this entire area.   He is eating and having bowel movements fairly regularly. His bowel movements are assisted due to his paraplegia. This wound is nearly healed at this point. He is noted when he has to stimulate a bowel movement in the inside his rectum that he has a mass in the concern that it might be a new infection today. He reports no other complaints.   Review of Systems     Objective:   Physical Exam He has a very thin open wound in the right perirectal region is healing without any infection. This is almost healed now. On anoscopy he has internal hemorrhoids in all 3 positions I think the area that he feels it is bothering him is just a mildly inflamed internal hemorrhoid    Assessment:     Status post debridement of perirectal abscess Internal hemorrhoids    Plan:     We discussed that this we will just continue to heal I think he should be completely done in the next several weeks. I told him that this area does look quite a hemorrhoid that is not predisposed him to any other infections and we will just follow this as well. I will see him back in about 6 weeks now.

## 2012-07-10 ENCOUNTER — Emergency Department (HOSPITAL_COMMUNITY)
Admission: EM | Admit: 2012-07-10 | Discharge: 2012-07-10 | Disposition: A | Discharge status: Home / Self Care | Payer: Medicare Other | Attending: Emergency Medicine | Admitting: Emergency Medicine

## 2012-07-10 ENCOUNTER — Encounter (HOSPITAL_COMMUNITY): Payer: Self-pay | Admitting: Emergency Medicine

## 2012-07-10 DIAGNOSIS — T148XXA Other injury of unspecified body region, initial encounter: Secondary | ICD-10-CM

## 2012-07-10 DIAGNOSIS — T8189XA Other complications of procedures, not elsewhere classified, initial encounter: Secondary | ICD-10-CM | POA: Insufficient documentation

## 2012-07-10 DIAGNOSIS — Z79899 Other long term (current) drug therapy: Secondary | ICD-10-CM | POA: Insufficient documentation

## 2012-07-10 DIAGNOSIS — K648 Other hemorrhoids: Secondary | ICD-10-CM | POA: Insufficient documentation

## 2012-07-10 DIAGNOSIS — Y838 Other surgical procedures as the cause of abnormal reaction of the patient, or of later complication, without mention of misadventure at the time of the procedure: Secondary | ICD-10-CM | POA: Insufficient documentation

## 2012-07-10 DIAGNOSIS — IMO0002 Reserved for concepts with insufficient information to code with codable children: Secondary | ICD-10-CM

## 2012-07-10 DIAGNOSIS — G8220 Paraplegia, unspecified: Secondary | ICD-10-CM | POA: Insufficient documentation

## 2012-07-10 DIAGNOSIS — K625 Hemorrhage of anus and rectum: Secondary | ICD-10-CM | POA: Insufficient documentation

## 2012-07-10 LAB — CBC WITH DIFFERENTIAL/PLATELET
Basophils Absolute: 0 10*3/uL (ref 0.0–0.1)
Basophils Relative: 0 % (ref 0–1)
Eosinophils Absolute: 0.1 10*3/uL (ref 0.0–0.7)
Eosinophils Relative: 2 % (ref 0–5)
HCT: 44.5 % (ref 39.0–52.0)
Hemoglobin: 15.1 g/dL (ref 13.0–17.0)
Lymphocytes Relative: 21 % (ref 12–46)
Lymphs Abs: 1.4 10*3/uL (ref 0.7–4.0)
MCH: 30.3 pg (ref 26.0–34.0)
MCHC: 33.9 g/dL (ref 30.0–36.0)
MCV: 89.4 fL (ref 78.0–100.0)
Monocytes Absolute: 0.5 10*3/uL (ref 0.1–1.0)
Monocytes Relative: 8 % (ref 3–12)
Neutro Abs: 4.8 10*3/uL (ref 1.7–7.7)
Neutrophils Relative %: 70 % (ref 43–77)
Platelets: 314 10*3/uL (ref 150–400)
RBC: 4.98 MIL/uL (ref 4.22–5.81)
RDW: 12.3 % (ref 11.5–15.5)
WBC: 6.9 10*3/uL (ref 4.0–10.5)

## 2012-07-10 LAB — BASIC METABOLIC PANEL
BUN: 15 mg/dL (ref 6–23)
CO2: 26 mEq/L (ref 19–32)
Calcium: 9.4 mg/dL (ref 8.4–10.5)
Chloride: 101 mEq/L (ref 96–112)
Creatinine, Ser: 0.77 mg/dL (ref 0.50–1.35)
GFR calc Af Amer: >90 mL/min (ref >90)
GFR calc non Af Amer: >90 mL/min (ref >90)
Glucose, Bld: 81 mg/dL (ref 70–99)
Potassium: 4.7 mEq/L (ref 3.5–5.1)
Sodium: 137 mEq/L (ref 135–145)

## 2012-07-10 LAB — LACTIC ACID, PLASMA: Lactic Acid, Venous: 1.4 mmol/L (ref 0.5–2.2)

## 2012-07-10 MED ORDER — INFLUENZA VIRUS VACC SPLIT PF IM SUSP: 0.5000 mL | INTRAMUSCULAR | Status: DC

## 2012-07-10 NOTE — Progress Notes (Signed)
Patient ID: [name], male   DOB: 1971/05/29, 41 y.o.   MRN: [id number]    Subjective: Asked to see patient in the [facility] secondary to some serous drainage from his wound.  He had a large perirectal abscess drained by Dr. Dwain Sarna several months ago.  He noticed some increase in "pinkish" discharge last night and came to the ED.  Objective: Vital signs in last 24 hours: Temp:  [98.2 F (36.8 C)] 98.2 F (36.8 C) (09/19 0752) Pulse Rate:  [71-86] 71  (09/19 1203) Resp:  [16-20] 16  (09/19 1203) BP: (95-110)/(62-64) 110/62 mmHg (09/19 1203) SpO2:  [97 %-100 %] 97 % (09/19 1203) Weight:  [145 lb (65.772 kg)] 145 lb (65.772 kg) (09/19 0752)    Intake/Output from previous day:   Intake/Output this shift:    PE: Buttock: wound is almost completely healed.  A very small area of pink tissue is barely still visible.  This wound is dry with no evidence of bleeding or serous drainage at this time.    Lab Results:   Basename 07/10/12 0930  WBC 6.9  HGB 15.1  HCT 44.5  PLT 314   BMET  Basename 07/10/12 0930  NA 137  K 4.7  CL 101  CO2 26  GLUCOSE 81  BUN 15  CREATININE 0.77  CALCIUM 9.4   PT/INR No results found for this basename: LABPROT:2,INR:2 in the last 72 hours CMP     Component Value Date/Time   NA 137 07/10/2012 0930   K 4.7 07/10/2012 0930   CL 101 07/10/2012 0930   CO2 26 07/10/2012 0930   GLUCOSE 81 07/10/2012 0930   BUN 15 07/10/2012 0930   CREATININE 0.77 07/10/2012 0930   CALCIUM 9.4 07/10/2012 0930   PROT 6.4 03/21/2012 0636   ALBUMIN 1.7* 03/21/2012 0636   AST 21 03/21/2012 0636   ALT 10 03/21/2012 0636   ALKPHOS 67 03/21/2012 0636   BILITOT 0.3 03/21/2012 0636   GFRNONAA >90 07/10/2012 0930   GFRAA >90 07/10/2012 0930   Lipase  No results found for this basename: lipase       Studies/Results: No results found.  Anti-infectives: Anti-infectives    None       Assessment/Plan  1. S/p incision and drainage of perirectal abscess  Plan: 1.  The wound is clean with no evidence of recurrent abscess or purulent, bleeding, or serous drainage.  He has an appointment with Dr. Dwain Sarna, which he is encouraged to keep for further follow up.   LOS: 0 days    Maty Zeisler E 07/10/2012, 12:58 PM Pager: [phone]

## 2012-07-10 NOTE — ED Provider Notes (Signed)
History     CSN: [id number]  Arrival date & time 07/10/12  0736   First MD Initiated Contact with Patient 07/10/12 [phone]      Chief Complaint  Patient presents with  . Rectal Bleeding    (Consider location/radiation/quality/duration/timing/severity/associated sxs/prior treatment) HPI The patient presents with concerns of bloody discharge from his rectal area.  Notably, the patient had Fournier's gangrene, sepsis, four months pta.  Since a prolonged rehabilitation, he notes that he has been doing generally well, notes that he has frequent followup visits with his surgeon, and continues to have bowel movements daily, good appetite, no fevers, chills, vomiting. He notes over the last half a day he's noticed blood in his rectal area, with fluid.  This is painless.  The patient continues to stimulate his own bowel movements digitally.  The patient also has multiple internal hemorrhoids, per his admission, and prior notes. He states that he feels as though one of these has changed in dimension recently.   Past Medical History  Diagnosis Date  . Seizures   . Tobacco use 03/20/2012    Past Surgical History  Procedure Date  . Spine surgery   . Bladder surgery   . Incision and drainage perirectal abscess     Family History  Problem Relation Age of Onset  . Hypertension Mother     History  Substance Use Topics  . Smoking status: Current Every Day Smoker -- 1.0 packs/day for 21 years    Types: Cigarettes  . Smokeless tobacco: Never Used  . Alcohol Use: No      Review of Systems  Constitutional:       Per HPI, otherwise negative  HENT:       Per HPI, otherwise negative  Eyes: Negative.   Respiratory:       Per HPI, otherwise negative  Cardiovascular:       Per HPI, otherwise negative  Gastrointestinal: Positive for blood in stool and anal bleeding. Negative for nausea, vomiting, abdominal pain, diarrhea, constipation, abdominal distention and rectal pain.  Genitourinary:  Negative for penile swelling and scrotal swelling.  Musculoskeletal:       Per HPI, otherwise negative  Skin: Negative.   Neurological: Negative for syncope.    Allergies  Review of patient's allergies indicates no known allergies.  Home Medications   Current Outpatient Rx  Name Route Sig Dispense Refill  . DIVALPROEX SODIUM ER 500 MG PO TB24 Oral Take 500 mg by mouth 2 (two) times daily. Patient uses brand name medication, desires to use own medication, await MD order    . ACETAMINOPHEN 325 MG PO TABS Oral Take 2 tablets (650 mg total) by mouth every 6 (six) hours as needed (or Temp > 100).    Marland Kitchen PIPERACILLIN-TAZOBACTAM 3.375 G IVPB Intravenous Inject 50 mLs (3.375 g total) into the vein every 8 (eight) hours. 50 mL   . VANCOMYCIN HCL IN DEXTROSE 1 GM/200ML IV SOLN Intravenous Inject 200 mLs (1,000 mg total) into the vein every 8 (eight) hours. 4000 mL     BP 95/64  Pulse 86  Temp 98.2 F (36.8 C) (Oral)  Resp 20  Ht 6\' 2"  (1.88 m)  Wt 145 lb (65.772 kg)  BMI 18.62 kg/m2  SpO2 100%  Physical Exam  Nursing note and vitals reviewed. Constitutional: He is oriented to person, place, and time. He appears well-developed and well-nourished. No distress.  HENT:  Head: Normocephalic and atraumatic.  Eyes: Conjunctivae normal and EOM are normal. Right eye exhibits  no discharge. Left eye exhibits no discharge.  Neck: No tracheal deviation present.  Cardiovascular: Normal rate and regular rhythm.   Pulmonary/Chest: Effort normal. No stridor. No respiratory distress.  Abdominal: He exhibits no distension. There is no tenderness.  Genitourinary: Rectal exam shows internal hemorrhoid. Rectal exam shows no fissure, no mass and no tenderness. Guaiac negative stool.           No frank rectal bleed, internally there is no significant mass, and stool is grossly negative  Musculoskeletal:       Diffuse atrophy  Neurological: He is alert and oriented to person, place, and time. No cranial  nerve deficit.    ED Course  Procedures (including critical care time)   Labs Reviewed  CBC WITH DIFFERENTIAL  BASIC METABOLIC PANEL  LACTIC ACID, PLASMA   No results found.   No diagnosis found.    MDM  This generally well-appearing male with paraplegia now presents with concerns of increasing bloody and clear discharge from a surgical wound.  On exam he is in no distress.  The wound is open with serosanguineous drainage.  The patient's vital signs and labs are largely reassuring, but given his lack of sensation, his recent episode of Fournier's, characteristically new discharge, I discussed the case with both the patient's surgeon, and the surgeon's colleagues, who evaluated the patient.  Absent concerning findings, the patient will be discharged in stable condition with close outpatient followup.  Gerhard Munch, MD 07/10/12 1151

## 2012-07-10 NOTE — ED Notes (Signed)
Patient states he began bleeding from his rectum in the middle of the night.  Patient has h/o boil near rectum.  Patient denies fevers or pain.

## 2012-08-05 ENCOUNTER — Ambulatory Visit (INDEPENDENT_AMBULATORY_CARE_PROVIDER_SITE_OTHER): Payer: Medicare Other | Admitting: General Surgery

## 2012-08-05 ENCOUNTER — Encounter (INDEPENDENT_AMBULATORY_CARE_PROVIDER_SITE_OTHER): Payer: Self-pay | Admitting: General Surgery

## 2012-08-05 VITALS — BP 100/60 | HR 84 | Temp 98.8°F | Resp 16 | Ht 73.0 in | Wt 155.0 lb

## 2012-08-05 DIAGNOSIS — S31809A Unspecified open wound of unspecified buttock, initial encounter: Secondary | ICD-10-CM

## 2012-08-05 NOTE — Progress Notes (Signed)
Subjective:     Patient ID: [name], male   DOB: Aug 26, 1971, 40 y.o.   MRN: [id number]  HPI This is a 41 year old male who presents after undergoing a debridement for Fournier's gangrene. He's wound has been slowly healing. There is one small area at the superior aspect of this it is still open and draining occasionally. He is otherwise having bowel movements as this is no warmth. He reports no real complaints. He again today asked if I can close his wound.  Review of Systems     Objective:   Physical Exam In the left buttock near his gluteal cleft extending from below his anus up towards the superior aspect of his gluteal cleft is a healing incision. There is about a 1.5 cm open area at the superior aspect of this draining some fluid that is about 1 cm deep right now.    Assessment:     Open wound left buttock    Plan:     I discussed with him that this will continue to heal. There is absolutely no role to close this. He will continue doing as conservative therapy. I think this is made worse by the fact that he is in his chair to the fact he is paraplegic. I will plan on seeing him back in a month or so.

## 2012-08-25 ENCOUNTER — Ambulatory Visit (INDEPENDENT_AMBULATORY_CARE_PROVIDER_SITE_OTHER): Payer: Medicare Other | Admitting: General Surgery

## 2012-08-25 ENCOUNTER — Encounter (INDEPENDENT_AMBULATORY_CARE_PROVIDER_SITE_OTHER): Payer: Self-pay | Admitting: General Surgery

## 2012-08-25 VITALS — BP 108/60 | HR 76 | Temp 97.9°F | Resp 16 | Ht 73.5 in | Wt 157.0 lb

## 2012-08-25 DIAGNOSIS — Z09 Encounter for follow-up examination after completed treatment for conditions other than malignant neoplasm: Secondary | ICD-10-CM

## 2012-08-25 NOTE — Progress Notes (Signed)
Subjective:     Patient ID: [name], male   DOB: Aug 19, 1971, 41 y.o.   MRN: [id number]  HPI This is a 41 year old male who has a paraplegic who presented when I was the on-call doctor Wonda Olds with a perirectal abscess that had formed and Fourniers gangrene. He had an elevated white blood cell count as well as he was septic at that time. I took him to the operating room and did an incision and drainage as well as a debridement of this entire area. He is eating and having bowel movements fairly regularly. His bowel movements are assisted due to his paraplegia. This wound is nearly healed at this point.    Review of Systems     Objective:   Physical Exam About a half centimeter open area on his wound but otherwise this is healthy without infection    Assessment:     Nearly healed open wound status post debridement    Plan:  He can keep covering this area should heal I can see him back in a couple of months.

## 2012-08-26 ENCOUNTER — Encounter (INDEPENDENT_AMBULATORY_CARE_PROVIDER_SITE_OTHER): Payer: Medicare Other | Admitting: General Surgery

## 2012-08-28 ENCOUNTER — Ambulatory Visit: Payer: Worker's Compensation | Admitting: Physical Medicine & Rehabilitation

## 2012-08-29 ENCOUNTER — Encounter (INDEPENDENT_AMBULATORY_CARE_PROVIDER_SITE_OTHER): Payer: Medicare Other | Admitting: General Surgery

## 2012-09-02 ENCOUNTER — Encounter: Payer: Self-pay | Admitting: Physical Medicine & Rehabilitation

## 2012-09-02 ENCOUNTER — Encounter: Payer: Medicare Other | Attending: Physical Medicine & Rehabilitation

## 2012-09-02 ENCOUNTER — Ambulatory Visit (HOSPITAL_BASED_OUTPATIENT_CLINIC_OR_DEPARTMENT_OTHER): Payer: Medicare Other | Admitting: Physical Medicine & Rehabilitation

## 2012-09-02 VITALS — BP 118/72 | HR 91 | Resp 14 | Ht 73.0 in | Wt 155.0 lb

## 2012-09-02 DIAGNOSIS — S14109A Unspecified injury at unspecified level of cervical spinal cord, initial encounter: Secondary | ICD-10-CM

## 2012-09-02 DIAGNOSIS — K612 Anorectal abscess: Secondary | ICD-10-CM | POA: Insufficient documentation

## 2012-09-02 DIAGNOSIS — S14101A Unspecified injury at C1 level of cervical spinal cord, initial encounter: Secondary | ICD-10-CM

## 2012-09-02 DIAGNOSIS — G8250 Quadriplegia, unspecified: Secondary | ICD-10-CM | POA: Insufficient documentation

## 2012-09-02 DIAGNOSIS — R259 Unspecified abnormal involuntary movements: Secondary | ICD-10-CM | POA: Insufficient documentation

## 2012-09-02 NOTE — Progress Notes (Signed)
  Subjective:    Patient ID: [name], male    DOB: Aug 01, 1971, 41 y.o.   MRN: [id number]  HPI Patient had perirectal abscess. Still having treatment from general surgery. Reviewed notes from general surgeon. Bowel movements once per day. Uses Metamucil once per week No seizures  Pain Inventory Average Pain 2 Pain Right Now 2 My pain is burning  In the last 24 hours, has pain interfered with the following? General activity 10 Relation with others 9 Enjoyment of life 0 What TIME of day is your pain at its worst? daytime Sleep (in general) Fair  Pain is worse with: sitting Pain improves with: pacing activities Relief from Meds: 0  Mobility use a wheelchair  Function disabled: date disabled spinal cord injury  Neuro/Psych No problems in this area  Prior Studies Any changes since last visit?  no  Physicians involved in your care Any changes since last visit?  yes rectal surgery due to infection   Family History  Problem Relation Age of Onset  . Hypertension Mother    History   Social History  . Marital Status: Single    Spouse Name: N/A    Number of Children: N/A  . Years of Education: N/A   Social History Main Topics  . Smoking status: Current Every Day Smoker -- 1.0 packs/day for 21 years    Types: Cigarettes  . Smokeless tobacco: Never Used  . Alcohol Use: No  . Drug Use: No  . Sexually Active: No   Other Topics Concern  . None   Social History Narrative  . None   Past Surgical History  Procedure Date  . Spine surgery   . Bladder surgery   . Incision and drainage perirectal abscess    Past Medical History  Diagnosis Date  . Seizures   . Tobacco use 03/20/2012   BP 118/72  Pulse 91  Resp 14  Ht 6\' 1"  (1.854 m)  Wt 155 lb (70.308 kg)  BMI 20.45 kg/m2  SpO2 98%     Review of Systems  Constitutional: Positive for fever.  Musculoskeletal: Positive for myalgias and arthralgias.  All other systems reviewed and are  negative.       Objective:   Physical Exam  Constitutional: He is oriented to person, place, and time. He appears well-developed and well-nourished.  HENT:  Head: Normocephalic and atraumatic.  Musculoskeletal:  Right ankle: He exhibits decreased range of motion.  Left ankle: He exhibits decreased range of motion.  Neurological: He is alert and oriented to person, place, and time. He displays atrophy and abnormal reflex. No sensory deficit. He exhibits abnormal muscle tone. Gait abnormal.  Reflex Scores:  Patellar reflexes are 4+ on the right side and 4+ on the left side.  Achilles reflexes are 4+ on the right side and 4+ on the left side. Clonus at bilat knee ext and ankle DF  Motor strength is 4/5 bilateral deltoid, biceps, triceps wrist extensors 2 minus in the left hand intrinsics and 1/5 in the right hand intrinsics     Assessment & Plan:  1. CT 7 [place] B. quadriparesis. Stable deficits. Needs new wheelchair. Will make referral to physical therapy who will in turn work with the equipment supplier. He has spasticity but does not wish to have any type spasticity medication.

## 2012-09-02 NOTE — Patient Instructions (Signed)
Recommend followup with urology please call their office  renal ultrasound recommended on  yearly basis Return to clinic 6 months

## 2012-10-07 ENCOUNTER — Ambulatory Visit (INDEPENDENT_AMBULATORY_CARE_PROVIDER_SITE_OTHER): Payer: Medicare Other | Admitting: General Surgery

## 2012-10-07 ENCOUNTER — Encounter (INDEPENDENT_AMBULATORY_CARE_PROVIDER_SITE_OTHER): Payer: Self-pay | Admitting: General Surgery

## 2012-10-07 VITALS — BP 100/60 | HR 66 | Temp 98.6°F | Resp 18 | Wt 160.0 lb

## 2012-10-07 DIAGNOSIS — Z09 Encounter for follow-up examination after completed treatment for conditions other than malignant neoplasm: Secondary | ICD-10-CM

## 2012-10-07 NOTE — Progress Notes (Signed)
Subjective:     Patient ID: DEMETRIE BORGE, male   DOB: 08-30-1971, 41 y.o.   MRN: [id number]  HPI  This is a 41 year old male who has a paraplegic who presented when I was the on-call doctor Wonda Olds with a perirectal abscess that had formed and had Fourniers gangrene. He had an elevated white blood cell count as well as he was septic at that time. I took him to the operating room and did an incision and drainage as well as a debridement of this entire area. He is eating and having bowel movements fairly regularly. His bowel movements are assisted due to his paraplegia.  He states he still has small amount of drainage but is otherwise ok.  Review of Systems     Objective:   Physical Exam His wound has healed and there is no opening.  I cannot identify any area that has been draining or a pocket of fluid today.  There is no infection  He does have some areas that the tape he is putting on has pulled his skin off laterally    Assessment:     S/p fourniers gangrene    Plan:     He is doing well.  He is still concerned about the wound and continues to ask if "it will be stitched up". The wound has healed at this point.  I don't know where the drainage might be coming from at this point.  I am going to have him follow up as needed. I asked him not to tape this area as much anymore.

## 2012-12-24 ENCOUNTER — Emergency Department (HOSPITAL_COMMUNITY)
Admission: EM | Admit: 2012-12-24 | Discharge: 2012-12-24 | Disposition: A | Discharge status: Home / Self Care | Payer: Medicare Other | Attending: Emergency Medicine | Admitting: Emergency Medicine

## 2012-12-24 ENCOUNTER — Encounter (HOSPITAL_COMMUNITY): Payer: Self-pay | Admitting: Emergency Medicine

## 2012-12-24 DIAGNOSIS — T148XXA Other injury of unspecified body region, initial encounter: Secondary | ICD-10-CM

## 2012-12-24 DIAGNOSIS — L0291 Cutaneous abscess, unspecified: Secondary | ICD-10-CM

## 2012-12-24 DIAGNOSIS — F17200 Nicotine dependence, unspecified, uncomplicated: Secondary | ICD-10-CM | POA: Insufficient documentation

## 2012-12-24 DIAGNOSIS — G40909 Epilepsy, unspecified, not intractable, without status epilepticus: Secondary | ICD-10-CM | POA: Insufficient documentation

## 2012-12-24 DIAGNOSIS — L02419 Cutaneous abscess of limb, unspecified: Secondary | ICD-10-CM | POA: Insufficient documentation

## 2012-12-24 DIAGNOSIS — L988 Other specified disorders of the skin and subcutaneous tissue: Secondary | ICD-10-CM | POA: Insufficient documentation

## 2012-12-24 DIAGNOSIS — G8220 Paraplegia, unspecified: Secondary | ICD-10-CM | POA: Insufficient documentation

## 2012-12-24 DIAGNOSIS — Z79899 Other long term (current) drug therapy: Secondary | ICD-10-CM | POA: Insufficient documentation

## 2012-12-24 HISTORY — DX: Unspecified injury at C5 level of cervical spinal cord, initial encounter: S14.105A

## 2012-12-24 MED ORDER — SULFAMETHOXAZOLE-TRIMETHOPRIM 800-160 MG PO TABS: 2.0000 | ORAL_TABLET | Freq: Two times a day (BID) | ORAL | Status: DC

## 2012-12-24 NOTE — ED Notes (Signed)
MD at bedside. 

## 2012-12-24 NOTE — ED Provider Notes (Signed)
History     CSN: [id number]  Arrival date & time 12/24/12  1209   First MD Initiated Contact with Patient 12/24/12 1226      Chief Complaint  Patient presents with  . Leg Swelling    (Consider location/radiation/quality/duration/timing/severity/associated sxs/prior treatment) HPI Comments: Patient presents to the ER for evaluation of blisters on his legs. Patient reports a blistered area just above his right knee. He says that has been present for weeks. He has occasional popping with pain, but feels that the fluid. He is now noticing a swollen, tender area on the outside portion of his left calf. Patient reports that he is concerned about this because he has a previous history of perirectal abscess requiring surgery and he wants to make sure this isn't a similar process.   Past Medical History  Diagnosis Date  . Seizures   . Tobacco use 03/20/2012  . C5 spinal cord injury     Past Surgical History  Procedure Laterality Date  . Spine surgery    . Bladder surgery    . Incision and drainage perirectal abscess      Family History  Problem Relation Age of Onset  . Hypertension Mother     History  Substance Use Topics  . Smoking status: Current Every Day Smoker -- 1.00 packs/day for 21 years    Types: Cigarettes  . Smokeless tobacco: Never Used  . Alcohol Use: No      Review of Systems  Constitutional: Negative for fever.  Respiratory: Negative.   Cardiovascular: Negative.   Skin: Positive for wound.  All other systems reviewed and are negative.    Allergies  Review of patient's allergies indicates no known allergies.  Home Medications   Current Outpatient Rx  Name  Route  Sig  Dispense  Refill  . divalproex (DEPAKOTE ER) 500 MG 24 hr tablet   Oral   Take 500 mg by mouth 2 (two) times daily. Take 1 tablet at 8 am and 8 pm daily           BP 132/89  Pulse 72  Temp(Src) 98.4 F (36.9 C) (Oral)  Resp 16  SpO2 100%  Physical Exam  Constitutional: He  appears well-developed.  HENT:  Head: Normocephalic.  Eyes: Pupils are equal, round, and reactive to light.  Musculoskeletal: He exhibits no edema.  Neurological:  C4 paraplegia  Skin:       ED Course  Procedures (including critical care time)  Labs Reviewed - No data to display No results found.   Diagnosis: 1. Skin Abscess 2. Blister    MDM  Patient reports blisters on his lower legs. The area on the right leg just above the knee is a clear fluid-filled blister without any signs of infection. Patient was counseled to stop popping area, and allowed to heal on its own. Patient does, however, have an area of erythema and slight induration on the lateral aspect of the left calf. It is well circumscribed and circular. This is consistent with an area in hair follicle are similar. There is no fluctuance to indicate any drainable abscess at this time. Patient will be treated empirically with Bactrim, follow up with Dr. Dwain Sarna or the ER if the area worsens.        Gilda Crease, MD 12/24/12 1257

## 2012-12-24 NOTE — ED Notes (Signed)
Pt c/o blisters to legs bilaterally.  Blisters are on outside of knees.  Pt has C4 injury w/ paralysis.  States he has infection on his bottom from stimulating himself to use the bathroom.  Pt is being seen for that.  Denies having anything rubbing on the outside of his knees.

## 2012-12-25 ENCOUNTER — Ambulatory Visit: Payer: Self-pay | Admitting: Physical Medicine & Rehabilitation

## 2013-01-06 ENCOUNTER — Encounter (INDEPENDENT_AMBULATORY_CARE_PROVIDER_SITE_OTHER): Payer: Self-pay | Admitting: General Surgery

## 2013-01-06 ENCOUNTER — Ambulatory Visit (INDEPENDENT_AMBULATORY_CARE_PROVIDER_SITE_OTHER): Payer: Medicare Other | Admitting: General Surgery

## 2013-01-06 VITALS — BP 110/68 | HR 72 | Temp 97.2°F | Resp 16 | Ht 73.0 in | Wt 150.0 lb

## 2013-01-06 DIAGNOSIS — Z09 Encounter for follow-up examination after completed treatment for conditions other than malignant neoplasm: Secondary | ICD-10-CM

## 2013-01-06 NOTE — Progress Notes (Signed)
Subjective:     Patient ID: [name], male   DOB: 1971-04-06, 43 y.o.   MRN: [id number]  HPI This is a 42 year old male who has a paraplegic who presented when I was the on-call doctor Wonda Olds with a perirectal abscess that had formed and had Fourniers gangrene. He had an elevated white blood cell count as well as he was septic at that time. I took him to the operating room and did an incision and drainage as well as a debridement of this entire area. He is eating and having bowel movements fairly regularly. His bowel movements are assisted due to his paraplegia. He is back to all his normal activity but had a small spot of blood on one dressing.  Otherwise he is well without any complaints.   Review of Systems     Objective:   Physical Exam Healed wound except for small pinhole without infection, I think he irritated this new skin on scar with transfers but this is essentially healed and there is no sign infection    Assessment:     S/p debridement perirectal abscess with resultant nec fasc     Plan:     I think he can continue to do normal activity including transfers which are a necessity.  I told him padding on chair etc would continue to help.  If he has any more problems will have him return.

## 2013-02-05 ENCOUNTER — Encounter (HOSPITAL_COMMUNITY): Payer: Self-pay | Admitting: Emergency Medicine

## 2013-02-05 ENCOUNTER — Emergency Department (HOSPITAL_COMMUNITY): Payer: Medicare Other

## 2013-02-05 ENCOUNTER — Inpatient Hospital Stay (HOSPITAL_COMMUNITY)
Admission: EM | Admit: 2013-02-05 | Discharge: 2013-02-08 | DRG: 690 | Disposition: A | Discharge status: Home / Self Care | Payer: Medicare Other | Attending: Internal Medicine | Admitting: Internal Medicine

## 2013-02-05 DIAGNOSIS — F17200 Nicotine dependence, unspecified, uncomplicated: Secondary | ICD-10-CM | POA: Diagnosis present

## 2013-02-05 DIAGNOSIS — R509 Fever, unspecified: Secondary | ICD-10-CM | POA: Diagnosis present

## 2013-02-05 DIAGNOSIS — R9401 Abnormal electroencephalogram [EEG]: Secondary | ICD-10-CM | POA: Diagnosis present

## 2013-02-05 DIAGNOSIS — I498 Other specified cardiac arrhythmias: Secondary | ICD-10-CM | POA: Diagnosis present

## 2013-02-05 DIAGNOSIS — D72829 Elevated white blood cell count, unspecified: Secondary | ICD-10-CM | POA: Diagnosis present

## 2013-02-05 DIAGNOSIS — Z720 Tobacco use: Secondary | ICD-10-CM

## 2013-02-05 DIAGNOSIS — Z87898 Personal history of other specified conditions: Secondary | ICD-10-CM | POA: Diagnosis present

## 2013-02-05 DIAGNOSIS — G40909 Epilepsy, unspecified, not intractable, without status epilepticus: Secondary | ICD-10-CM | POA: Diagnosis present

## 2013-02-05 DIAGNOSIS — E875 Hyperkalemia: Secondary | ICD-10-CM | POA: Diagnosis present

## 2013-02-05 DIAGNOSIS — S14109A Unspecified injury at unspecified level of cervical spinal cord, initial encounter: Secondary | ICD-10-CM

## 2013-02-05 DIAGNOSIS — R569 Unspecified convulsions: Secondary | ICD-10-CM | POA: Diagnosis present

## 2013-02-05 DIAGNOSIS — G8220 Paraplegia, unspecified: Secondary | ICD-10-CM | POA: Diagnosis present

## 2013-02-05 DIAGNOSIS — Z79899 Other long term (current) drug therapy: Secondary | ICD-10-CM

## 2013-02-05 DIAGNOSIS — N390 Urinary tract infection, site not specified: Secondary | ICD-10-CM | POA: Diagnosis present

## 2013-02-05 LAB — CBC
HCT: 45.2 % (ref 39.0–52.0)
Hemoglobin: 16.1 g/dL (ref 13.0–17.0)
MCH: 31.6 pg (ref 26.0–34.0)
MCHC: 35.6 g/dL (ref 30.0–36.0)
MCV: 88.8 fL (ref 78.0–100.0)
Platelets: 161 10*3/uL (ref 150–400)
RBC: 5.09 MIL/uL (ref 4.22–5.81)
RDW: 13.2 % (ref 11.5–15.5)
WBC: 14.1 10*3/uL — ABNORMAL HIGH (ref 4.0–10.5)

## 2013-02-05 LAB — BASIC METABOLIC PANEL
BUN: 12 mg/dL (ref 6–23)
CO2: 25 mEq/L (ref 19–32)
Calcium: 9 mg/dL (ref 8.4–10.5)
Chloride: 98 mEq/L (ref 96–112)
Creatinine, Ser: 0.98 mg/dL (ref 0.50–1.35)
GFR calc Af Amer: >90 mL/min (ref >90)
GFR calc non Af Amer: >90 mL/min (ref >90)
Glucose, Bld: 87 mg/dL (ref 70–99)
Potassium: 4.2 mEq/L (ref 3.5–5.1)
Sodium: 132 mEq/L — ABNORMAL LOW (ref 135–145)

## 2013-02-05 MED ORDER — IBUPROFEN 200 MG PO TABS
600.0000 mg | ORAL_TABLET | Freq: Once | ORAL | Status: AC
  Administered 2013-02-06: 600 mg via ORAL
  Filled 2013-02-05: qty 3

## 2013-02-05 NOTE — ED Notes (Signed)
Per EMS, patient from home, patient with prior traumatic injury that resulted in waist down paralysis. Patient has a history of focal seizures that he takes Depakote for. Patient mom and brother primary caregivers. Patient was c/o being cold earlier today, skin warm to touch at this time. Patient with increased lethargy today. Patient has a foley cath. Patient was seen @1  year ago for a large abscess on his buttocks, it was opened and debrided, and left open and was told to take prophylactic abx of Septra, most recent was filled on 12/24/12, EMS reports bottle was almost full. Patient remains post ictal at this time.

## 2013-02-05 NOTE — ED Notes (Signed)
[id number]<AV> Expected date:<BR> Expected time:<BR> Means of arrival:<BR> Comments:<BR> EMS/42 yo from home-&quot;cold all day&quot;/frequent focal seizures-felt warm by care giver-altered

## 2013-02-06 ENCOUNTER — Inpatient Hospital Stay (HOSPITAL_COMMUNITY)
Admit: 2013-02-06 | Discharge: 2013-02-06 | Disposition: A | Discharge status: Home / Self Care | Payer: Medicare Other | Attending: Neurology | Admitting: Neurology

## 2013-02-06 ENCOUNTER — Encounter (HOSPITAL_COMMUNITY): Payer: Self-pay | Admitting: Internal Medicine

## 2013-02-06 ENCOUNTER — Inpatient Hospital Stay (HOSPITAL_COMMUNITY): Payer: Medicare Other

## 2013-02-06 DIAGNOSIS — N390 Urinary tract infection, site not specified: Secondary | ICD-10-CM

## 2013-02-06 DIAGNOSIS — R509 Fever, unspecified: Secondary | ICD-10-CM

## 2013-02-06 DIAGNOSIS — R569 Unspecified convulsions: Secondary | ICD-10-CM

## 2013-02-06 HISTORY — DX: Urinary tract infection, site not specified: N39.0

## 2013-02-06 LAB — CBC WITH DIFFERENTIAL/PLATELET
Basophils Absolute: 0 10*3/uL (ref 0.0–0.1)
Basophils Relative: 0 % (ref 0–1)
Eosinophils Absolute: 0 10*3/uL (ref 0.0–0.7)
Eosinophils Relative: 0 % (ref 0–5)
HCT: 43.9 % (ref 39.0–52.0)
Hemoglobin: 15.2 g/dL (ref 13.0–17.0)
Lymphocytes Relative: 18 % (ref 12–46)
Lymphs Abs: 2.6 10*3/uL (ref 0.7–4.0)
MCH: 31.1 pg (ref 26.0–34.0)
MCHC: 34.6 g/dL (ref 30.0–36.0)
MCV: 90 fL (ref 78.0–100.0)
Monocytes Absolute: 1.5 10*3/uL — ABNORMAL HIGH (ref 0.1–1.0)
Monocytes Relative: 11 % (ref 3–12)
Neutro Abs: 10.3 10*3/uL — ABNORMAL HIGH (ref 1.7–7.7)
Neutrophils Relative %: 71 % (ref 43–77)
Platelets: 164 10*3/uL (ref 150–400)
RBC: 4.88 MIL/uL (ref 4.22–5.81)
RDW: 13.2 % (ref 11.5–15.5)
WBC: 14.5 10*3/uL — ABNORMAL HIGH (ref 4.0–10.5)

## 2013-02-06 LAB — URINALYSIS, ROUTINE W REFLEX MICROSCOPIC
Bilirubin Urine: NEGATIVE
Glucose, UA: NEGATIVE mg/dL
Ketones, ur: NEGATIVE mg/dL
Nitrite: POSITIVE — AB
Protein, ur: NEGATIVE mg/dL
Specific Gravity, Urine: 1.009 (ref 1.005–1.030)
Urobilinogen, UA: 1 mg/dL (ref 0.0–1.0)
pH: 7 (ref 5.0–8.0)

## 2013-02-06 LAB — COMPREHENSIVE METABOLIC PANEL
ALT: 8 U/L (ref 0–53)
AST: 12 U/L (ref 0–37)
Albumin: 2.7 g/dL — ABNORMAL LOW (ref 3.5–5.2)
Alkaline Phosphatase: 73 U/L (ref 39–117)
BUN: 12 mg/dL (ref 6–23)
CO2: 28 mEq/L (ref 19–32)
Calcium: 8.6 mg/dL (ref 8.4–10.5)
Chloride: 102 mEq/L (ref 96–112)
Creatinine, Ser: 0.98 mg/dL (ref 0.50–1.35)
GFR calc Af Amer: >90 mL/min (ref >90)
GFR calc non Af Amer: >90 mL/min (ref >90)
Glucose, Bld: 79 mg/dL (ref 70–99)
Potassium: 4.2 mEq/L (ref 3.5–5.1)
Sodium: 137 mEq/L (ref 135–145)
Total Bilirubin: 0.7 mg/dL (ref 0.3–1.2)
Total Protein: 6.7 g/dL (ref 6.0–8.3)

## 2013-02-06 LAB — URINE MICROSCOPIC-ADD ON

## 2013-02-06 LAB — TROPONIN I
Troponin I: <0.3 ng/mL (ref <0.30)
Troponin I: <0.3 ng/mL (ref <0.30)

## 2013-02-06 LAB — VALPROIC ACID LEVEL: Valproic Acid Lvl: 59.8 ug/mL (ref 50.0–100.0)

## 2013-02-06 MED ORDER — ACETAMINOPHEN 325 MG PO TABS: 650.0000 mg | ORAL_TABLET | Freq: Four times a day (QID) | ORAL | Status: DC | PRN

## 2013-02-06 MED ORDER — SODIUM CHLORIDE 0.9 % IV BOLUS (SEPSIS)
1000.0000 mL | Freq: Once | INTRAVENOUS | Status: AC
  Administered 2013-02-06: 1000 mL via INTRAVENOUS

## 2013-02-06 MED ORDER — SODIUM CHLORIDE 0.9 % IV SOLN
1000.0000 mg | INTRAVENOUS | Status: AC
  Administered 2013-02-06: 1000 mg via INTRAVENOUS
  Filled 2013-02-06: qty 10

## 2013-02-06 MED ORDER — DEXTROSE 5 % IV SOLN
1.0000 g | Freq: Once | INTRAVENOUS | Status: AC
  Administered 2013-02-06: 1 g via INTRAVENOUS
  Filled 2013-02-06: qty 10

## 2013-02-06 MED ORDER — LEVETIRACETAM 500 MG PO TABS
500.0000 mg | ORAL_TABLET | Freq: Two times a day (BID) | ORAL | Status: DC
  Administered 2013-02-06 – 2013-02-07 (×2): 500 mg via ORAL
  Filled 2013-02-06 (×4): qty 1

## 2013-02-06 MED ORDER — ONDANSETRON HCL 4 MG/2ML IJ SOLN: 4.0000 mg | Freq: Four times a day (QID) | INTRAMUSCULAR | Status: DC | PRN

## 2013-02-06 MED ORDER — ENOXAPARIN SODIUM 40 MG/0.4ML ~~LOC~~ SOLN
40.0000 mg | SUBCUTANEOUS | Status: DC
  Administered 2013-02-06 – 2013-02-07 (×2): 40 mg via SUBCUTANEOUS
  Filled 2013-02-06 (×3): qty 0.4

## 2013-02-06 MED ORDER — SODIUM CHLORIDE 0.9 % IV SOLN
INTRAVENOUS | Status: AC
  Administered 2013-02-06 (×2): via INTRAVENOUS

## 2013-02-06 MED ORDER — SODIUM CHLORIDE 0.9 % IJ SOLN
3.0000 mL | Freq: Two times a day (BID) | INTRAMUSCULAR | Status: DC
  Administered 2013-02-07: 3 mL via INTRAVENOUS

## 2013-02-06 MED ORDER — ACETAMINOPHEN 650 MG RE SUPP: 650.0000 mg | Freq: Four times a day (QID) | RECTAL | Status: DC | PRN

## 2013-02-06 MED ORDER — DIVALPROEX SODIUM ER 500 MG PO TB24
500.0000 mg | ORAL_TABLET | Freq: Two times a day (BID) | ORAL | Status: DC
  Administered 2013-02-06 – 2013-02-07 (×3): 500 mg via ORAL
  Filled 2013-02-06 (×5): qty 1

## 2013-02-06 MED ORDER — ONDANSETRON HCL 4 MG PO TABS: 4.0000 mg | ORAL_TABLET | Freq: Four times a day (QID) | ORAL | Status: DC | PRN

## 2013-02-06 MED ORDER — SODIUM CHLORIDE 0.9 % IV SOLN
1000.0000 mg | Freq: Once | INTRAVENOUS | Status: DC
  Filled 2013-02-06: qty 10

## 2013-02-06 MED ORDER — DEXTROSE 5 % IV SOLN
1.0000 g | INTRAVENOUS | Status: DC
  Administered 2013-02-07 – 2013-02-08 (×2): 1 g via INTRAVENOUS
  Filled 2013-02-06 (×2): qty 10

## 2013-02-06 NOTE — H&P (Signed)
[facility] History and Physical  [name] ZOX:[id number] DOB: December 29, 1970 DOA: 02/05/2013  Referring physician: Beatris Ship. PCP: Provider Not In System   Chief Complaint: Fever.  HPI: [name] is a 42 y.o. male history of paraplegia secondary to C-spine injury and history of seizures and previous history of Fournier's gangrene and necrotizing fasciitis was brought to the ER because of fever. Patient states that since last night he's been having fever. Denies any nausea vomiting abdominal pain diarrhea or any chest pain or shortness of breath or productive cough. Patient has per family had a seizure at home. Again in the ER patient had also brief seizure witnessed by the nurse which lasted for less than a minute. During the seizure patient was awake and had some nystagmus and clinching of his fists. He did not lose consciousness procedure. But he was looking confused for a few minutes. At this time on my exam patient is alert awake oriented to time place and person and is able to provide history. On arrival patient was having fever of 101F. Chest x-ray was unremarkable but UA shows features of UTI. At this time patient has been admitted for further management. Patient's perineal area and scrotum does not infected. Patient does have a chronic wound on the left buttock area from previous necrotizing fasciitis which at this time does not infected.   Review of Systems: As presented in the history of presenting illness, rest negative.  Past Medical History  Diagnosis Date  . Tobacco use 03/20/2012  . C5 spinal cord injury   . Seizures     focal   Past Surgical History  Procedure Laterality Date  . Spine surgery    . Bladder surgery    . Incision and drainage perirectal abscess     Social History:  reports that he has been smoking Cigarettes.  He has a 21 pack-year smoking history. He has never used smokeless tobacco. He reports that he does not drink alcohol or use illicit  drugs.  lives at home with family. where does patient live--  cannot do ADLs. Can patient participate in ADLs?  No Known Allergies  Family History  Problem Relation Age of Onset  . Hypertension Mother       Prior to Admission medications   Medication Sig Start Date End Date Taking? Authorizing Provider  divalproex (DEPAKOTE ER) 500 MG 24 hr tablet Take 500 mg by mouth 2 (two) times daily. Take 1 tablet at 8 am and 8 pm daily   Yes Historical Provider, MD  sulfamethoxazole-trimethoprim (SEPTRA DS) 800-160 MG per tablet Take 2 tablets by mouth every 12 (twelve) hours. 12/24/12  Yes Gilda Crease, MD   Physical Exam: Filed Vitals:   02/06/13 8119 02/06/13 0510 02/06/13 0511 02/06/13 0530  BP: 148/89   126/84  Pulse: 43 41 54 54  Temp:      TempSrc:      Resp:      SpO2: 96% 99% 99% 97%     General:   well-developed well-nourished.  Eyes:  anicteric no pallor.  ENT:  no discharge from the ears eyes nose and mouth.  Neck:  no mass felt.  Cardiovascular:  S1-S2 heard.  Respiratory:  no rhonchi or crepitations.  Abdomen:  soft nontender bowel sounds present.  Skin:  no rash. There is also a left buttock area which is chronic and does not look infected.  Musculoskeletal:  no edema.  Psychiatric:  appears normal.  Neurologic:  alert awake oriented  to time place and person. Paraplegic.  Labs on Admission:  Basic Metabolic Panel:  Recent Labs Lab 02/05/13 2327  NA 132*  K 4.2  CL 98  CO2 25  GLUCOSE 87  BUN 12  CREATININE 0.98  CALCIUM 9.0   Liver Function Tests: No results found for this basename: AST, ALT, ALKPHOS, BILITOT, PROT, ALBUMIN,  in the last 168 hours No results found for this basename: LIPASE, AMYLASE,  in the last 168 hours No results found for this basename: AMMONIA,  in the last 168 hours CBC:  Recent Labs Lab 02/05/13 2327  WBC 14.1*  HGB 16.1  HCT 45.2  MCV 88.8  PLT 161   Cardiac Enzymes: No results found for this  basename: CKTOTAL, CKMB, CKMBINDEX, TROPONINI,  in the last 168 hours  BNP (last 3 results) No results found for this basename: PROBNP,  in the last 8760 hours CBG: No results found for this basename: GLUCAP,  in the last 168 hours  Radiological Exams on Admission: Dg Chest Portable 1 View  02/05/2013  *RADIOLOGY REPORT*  Clinical Data: Fever.  PORTABLE CHEST - 1 VIEW  Comparison: 03/28/2012  Findings: Heart size and pulmonary vascularity are normal and the lungs are clear.  No osseous abnormality.  IMPRESSION: Normal chest.   Original Report Authenticated By: Francene Boyers, M.D.      Assessment/Plan Principal Problem:   Fever Active Problems:   Cervical spinal cord injury   Seizures   UTI (lower urinary tract infection)   1. Fever most likely source is UTI - patient has been started on ceftriaxone. Follow blood cultures and urine cultures. Continue gentle hydration. 2. Seizures - have discussed with on-call neurologist Dr. Roseanne Reno. Since Depakote levels are therapeutic Dr. Roseanne Reno advised patient to be started on Keppra along with Depakote. They will be seeing patient in consult. 3. History of necrotizing fasciitis presently having a small wound on his left buttock - consult wound team. 4. Cervical spinal cord injury presently paraplegic. 5. Leukocytosis probably from #1 and 2 reasons.    Code Status:  full code.  Family Communication:  none.  Disposition Plan:  admit to inpatient.    KAKRAKANDY,ARSHAD N. [facility] Pager [phone].  If 7PM-7AM, please contact night-coverage [email or web address] Password [email or web address] 02/06/2013, 6:20 AM

## 2013-02-06 NOTE — Progress Notes (Signed)
Pt seen and examined at bedside. Please see earlier note by Dr. Toniann Fail. Pt currently stable, telemetry monitor reveals heart rate in 30s. Patient is asymptomatic. We'll obtain 12-lead EKG and cardiac enzymes. Will ask cardiologist to see patient in consultation.  Debbora Presto, MD  [facility] Pager [phone]  If 7PM-7AM, please contact night-coverage [email or web address] Password [email or web address]

## 2013-02-06 NOTE — Progress Notes (Addendum)
History: 42 yo M with history of seizures who had multiple breakthrough seizures today.   Background: The background consists of intermixed beta and alpha frequencies. There is a well defined posterior dominant rhythm of 9.5 Hz that attenuates with eye opening. This is very assymetric, seen on the right side, but not well seen on the left.   Photic stimulation: Physiologic driving is absent  EEG Abnormalities: 1) Asymetric PDR  Clinical Interpretation: This abnormal EEG is suggestive of a left posterior cortical dysfunction. There are no previous EEGs in our system to compare. He does have a CT from 1999 reporting a left occipital skull fracture, but it is unclear if there was damage to the underlying brain from that report. Brain imaging for correlate is recommended.    There was no seizure or seizure predisposition recorded on this study.   Ritta Slot, MD [facility] [phone]  If 7pm- 7am, please page neurology on call at [phone].

## 2013-02-06 NOTE — Progress Notes (Signed)
EEG completed.

## 2013-02-06 NOTE — Progress Notes (Signed)
Patient HR trends down to 30s, sustained (as low as 31) while asleep.  Dr. Izola Price notified; she has contacted [facility] and orders for 1L bolus given.  Patient VSS, asymptomatic. Will continue to monitor.

## 2013-02-06 NOTE — Progress Notes (Signed)
Patient HR decreased to 31 bpm while patient was sleeping.  Patient's face appeared flushed, was arousable, stated he feels fine, however patient is confused and not a reliable historian. BP 145/90.  HR currently 59.  EKG performed per Dr. Izola Price' order.  WIll continue to monitor patient closely.

## 2013-02-06 NOTE — ED Notes (Signed)
Had to replace leg bag to get urine.  Will try again in 30 minutes.

## 2013-02-06 NOTE — ED Notes (Signed)
Patient positive for seizure activity, lasting @60 -90 seconds. Patient was rigid, + for R eye nystagmus, with corticate posturing during seizure. Patient was sweating. Cool rag applied at conclusion of seizure. T 99.5 Oral, Pulse 71, BP 170/98, SPO2 100% on RA.

## 2013-02-06 NOTE — Progress Notes (Signed)
   CARE MANAGEMENT NOTE 02/06/2013  Patient:  [name], [name]   Account Number:  [id number]  Date Initiated:  02/06/2013  Documentation initiated by:  Jiles Crocker  Subjective/Objective Assessment:   ADMITTED WITH UTI, SEIZURES     Action/Plan:   PATIENT CONFUSED AT THIS TIME; CM WILL CONTINUE TO FOLLOW FOR DCP   Anticipated DC Date:  02/13/2013   Anticipated DC Plan:  SKILLED NURSING FACILITY      DC Planning Services  CM consult         Status of service:  In process, will continue to follow Medicare Important Message given?  NA - LOS <3 / Initial given by admissions (If response is "NO", the following Medicare IM given date fields will be blank) Per UR Regulation:  Reviewed for med. necessity/level of care/duration of stay Comments:  02/06/2013- B Sheng Pritz RN,BSN,MHA

## 2013-02-06 NOTE — ED Notes (Signed)
Attempted to call report, nurse unavailable.

## 2013-02-06 NOTE — Progress Notes (Signed)
Pt refuses to answer any of the questions of the nursing admission hx. Pt yelling "I am tired of these questions." Pt's rn Kamna notified. Briscoe Burns BSN, RN-BC Admissions RN  02/06/2013 10:29 AM

## 2013-02-06 NOTE — ED Provider Notes (Signed)
History     CSN: [id number]  Arrival date & time 02/05/13  2214   First MD Initiated Contact with Patient 02/05/13 2303      Chief Complaint  Patient presents with  . Fever  . focal seizures      The history is provided by the patient and a relative. History limited by: Level V caveat: Confusion.   the patient was brought to the emergency apartment for a seizure.  The patient has a known seizure disorder for which he takes Depakote.  Family reports his seizures are "memory loss".  The patient never has tonic-clonic activity.  The patient is a paraplegic from a prior traumatic injury.  Family reports that today he has had decreased level of energy and was noted to be warm to the touch and they're concerned he had a fever.  Rectal temp on arrival emergency apartment to 101.9.  No recent complaints of nausea or vomiting.  No recent diarrhea.  A year ago the patient had a really bad necrotizing infection in his groin that required surgical drainage.  Given this history Ladona Ridgel was concerned that this could be recurring and thus he was brought to the emergency department.  He's compliant with his Depakote.  He is a condom catheter as he is incontinent of urine.  As reported the patient is post be on prophylactic dose of Septra however EMS reports that most of the bottle was still filled.  Family reports the seizure itself is not a typical but the fever is which is white they came to the emergency department.  The patient denies abdominal pain chest pain shortness of breath.  He does seem to say inappropriate responses to standard and simple questions.  They state this is normally how he is after a seizure.  Past Medical History  Diagnosis Date  . Tobacco use 03/20/2012  . C5 spinal cord injury   . Seizures     focal    Past Surgical History  Procedure Laterality Date  . Spine surgery    . Bladder surgery    . Incision and drainage perirectal abscess      Family History  Problem Relation  Age of Onset  . Hypertension Mother     History  Substance Use Topics  . Smoking status: Current Every Day Smoker -- 1.00 packs/day for 21 years    Types: Cigarettes  . Smokeless tobacco: Never Used  . Alcohol Use: No      Review of Systems  Unable to perform ROS: Mental status change    Allergies  Review of patient's allergies indicates no known allergies.  Home Medications   Current Outpatient Rx  Name  Route  Sig  Dispense  Refill  . divalproex (DEPAKOTE ER) 500 MG 24 hr tablet   Oral   Take 500 mg by mouth 2 (two) times daily. Take 1 tablet at 8 am and 8 pm daily         . sulfamethoxazole-trimethoprim (SEPTRA DS) 800-160 MG per tablet   Oral   Take 2 tablets by mouth every 12 (twelve) hours.   40 tablet   0     BP 148/89  Pulse 54  Temp(Src) 101.9 F (38.8 C) (Rectal)  Resp 20  SpO2 99%  Physical Exam  Nursing note and vitals reviewed. Constitutional: He is oriented to person, place, and time. He appears well-developed and well-nourished.  HENT:  Head: Normocephalic and atraumatic.  Eyes: EOM are normal.  Neck: Normal range of  motion.  Cardiovascular: Normal rate, regular rhythm, normal heart sounds and intact distal pulses.   Pulmonary/Chest: Effort normal and breath sounds normal. No respiratory distress.  Abdominal: Soft. He exhibits no distension. There is no tenderness.  Genitourinary: Penis normal.  Condom catheter in place  Musculoskeletal: Normal range of motion.  Neurological: He is alert and oriented to person, place, and time.  Skin: Skin is warm and dry.  Psychiatric: He has a normal mood and affect. Judgment normal.    ED Course  Procedures (including critical care time)   Date: 02/06/2013  Rate: 58  Rhythm: normal sinus rhythm  QRS Axis: normal  Intervals: normal  ST/T Wave abnormalities: normal  Conduction Disutrbances: none  Narrative Interpretation:   Old EKG Reviewed: No significant changes noted     Labs Reviewed   CBC - Abnormal; Notable for the following:    WBC 14.1 (*)    All other components within normal limits  BASIC METABOLIC PANEL - Abnormal; Notable for the following:    Sodium 132 (*)    All other components within normal limits  URINALYSIS, ROUTINE W REFLEX MICROSCOPIC - Abnormal; Notable for the following:    APPearance CLOUDY (*)    Hgb urine dipstick TRACE (*)    Nitrite POSITIVE (*)    Leukocytes, UA LARGE (*)    All other components within normal limits  URINE MICROSCOPIC-ADD ON - Abnormal; Notable for the following:    Bacteria, UA MANY (*)    All other components within normal limits  CULTURE, BLOOD (ROUTINE X 2)  CULTURE, BLOOD (ROUTINE X 2)  URINE CULTURE  VALPROIC ACID LEVEL  COMPREHENSIVE METABOLIC PANEL   Dg Chest Portable 1 View  02/05/2013  *RADIOLOGY REPORT*  Clinical Data: Fever.  PORTABLE CHEST - 1 VIEW  Comparison: 03/28/2012  Findings: Heart size and pulmonary vascularity are normal and the lungs are clear.  No osseous abnormality.  IMPRESSION: Normal chest.   Original Report Authenticated By: Francene Boyers, M.D.    I personally reviewed the imaging tests through PACS system I reviewed available ER/hospitalization records through the EMR   1. Fever   2. Urinary tract infection       MDM  Patient will be admitted the hospital for altered mental status and fever 101.9.  A source appears to be urinary tract infection.  Urine culture sent.  IV Rocephin.  Patient be admitted the hospital given his complex history including his paraplegia and condom catheter use.  Of mental status appears to be improving.  Reported seizure today.  No indication for head CT.  Normal neurologic exam in terms of strength of his upper lower extremities.  His Depakote level is therapeutic.        Lyanne Co, MD 02/06/13 [phone]

## 2013-02-06 NOTE — Consult Note (Signed)
NEURO HOSPITALIST CONSULT NOTE    Reason for Consult: Seizure in setting of infection  HPI:                                                                                                                                          [name] is an 42 y.o. male  With known seizure disorder.patientis still slightly confused and cannot give a detailed history.  HE is able to tell me he takes his Depakote every day at 8AM and 8PM  and has not missed a dose. He is unable to tell me what brought him to hospital or about his previous seizures. When trying to obtain further history about his seizures he continue to perseverate on his buttuck wound.   Past Medical History  Diagnosis Date  . Tobacco use 03/20/2012  . C5 spinal cord injury   . Seizures     focal    Past Surgical History  Procedure Laterality Date  . Spine surgery    . Bladder surgery    . Incision and drainage perirectal abscess      Family History  Problem Relation Age of Onset  . Hypertension Mother      Social History:  reports that he has been smoking Cigarettes.  He has a 21 pack-year smoking history. He has never used smokeless tobacco. He reports that he does not drink alcohol or use illicit drugs.  No Known Allergies  MEDICATIONS:                                                                                                                     No current facility-administered medications for this encounter.   Current Outpatient Prescriptions  Medication Sig Dispense Refill  . divalproex (DEPAKOTE ER) 500 MG 24 hr tablet Take 500 mg by mouth 2 (two) times daily. Take 1 tablet at 8 am and 8 pm daily      . sulfamethoxazole-trimethoprim (SEPTRA DS) 800-160 MG per tablet Take 2 tablets by mouth every 12 (twelve) hours.  40 tablet  0      ROS:  History  obtained from the patient  General ROS: negative for - chills, fatigue, fever, night sweats, weight gain or weight loss Psychological ROS: negative for - behavioral disorder, hallucinations, memory difficulties, mood swings or suicidal ideation Ophthalmic ROS: negative for - blurry vision, double vision, eye pain or loss of vision ENT ROS: negative for - epistaxis, nasal discharge, oral lesions, sore throat, tinnitus or vertigo Allergy and Immunology ROS: negative for - hives or itchy/watery eyes Hematological and Lymphatic ROS: negative for - bleeding problems, bruising or swollen lymph nodes Endocrine ROS: negative for - galactorrhea, hair pattern changes, polydipsia/polyuria or temperature intolerance Respiratory ROS: negative for - cough, hemoptysis, shortness of breath or wheezing Cardiovascular ROS: negative for - chest pain, dyspnea on exertion, edema or irregular heartbeat Gastrointestinal ROS: negative for - abdominal pain, diarrhea, hematemesis, nausea/vomiting or stool incontinence Genito-Urinary ROS: negative for - dysuria, hematuria, incontinence or urinary frequency/urgency Musculoskeletal ROS: negative for - joint swelling or muscular weakness Neurological ROS: as noted in HPI Dermatological ROS: negative for rash and skin lesion changes   Blood pressure 125/84, pulse 48, temperature 97.4 F (36.3 C), temperature source Oral, resp. rate 16, SpO2 99.00%.   Neurologic Examination:                                                                                                      Mental Status: Alert, oriented to place, day and month. Doe snot get year correct. thought content shows mild perseveration.  Speech fluent without evidence of aphasia.  Able to follow 3 step commands without difficulty. Cranial Nerves: II: Discs flat bilaterally; Visual fields grossly normal, pupils equal, round, reactive to light and accommodation III,IV, VI: ptosis not present, extra-ocular motions  intact bilaterally V,VII: smile symmetric, facial light touch sensation normal bilaterally VIII: hearing normal bilaterally IX,X: gag reflex present XI: bilateral shoulder shrug XII: midline tongue extension Motor: Right : Upper extremity   5/5 Proximally, weakness in right hand   Left:     Upper extremity   5/5  --Bilateral LE held in spastic extension with sustained clonus noted in bilateral legs when attempted to passively bend. Bilateral ankles held in inversion again with sustained clonus when attempted to passively manipulate.  Sensory: Pinprick and light touch intact throughout, bilaterally Deep Tendon Reflexes: 2+ and symmetric throughout UE, 3+ bilateral KJ and AJ with sustained clonus at ankles with ankle jerk Plantars: Mute bilaterally Cerebellar: normal finger-to-nose,   CV: pulses palpable throughout    No results found for this basename: cbc, bmp, coags, chol, tri, ldl, hga1c    Results for orders placed during the hospital encounter of 02/05/13 (from the past 48 hour(s))  CBC     Status: Abnormal   Collection Time    02/05/13 11:27 PM      Result Value Range   WBC 14.1 (*) 4.0 - 10.5 K/uL   RBC 5.09  4.22 - 5.81 MIL/uL   Hemoglobin 16.1  13.0 - 17.0 g/dL   HCT 16.1  09.6 - 04.5 %   MCV 88.8  78.0 - 100.0 fL  MCH 31.6  26.0 - 34.0 pg   MCHC 35.6  30.0 - 36.0 g/dL   RDW 01.0  27.2 - 53.6 %   Platelets 161  150 - 400 K/uL  BASIC METABOLIC PANEL     Status: Abnormal   Collection Time    02/05/13 11:27 PM      Result Value Range   Sodium 132 (*) 135 - 145 mEq/L   Potassium 4.2  3.5 - 5.1 mEq/L   Chloride 98  96 - 112 mEq/L   CO2 25  19 - 32 mEq/L   Glucose, Bld 87  70 - 99 mg/dL   BUN 12  6 - 23 mg/dL   Creatinine, Ser 6.44  0.50 - 1.35 mg/dL   Calcium 9.0  8.4 - 03.4 mg/dL   GFR calc non Af Amer >90  >90 mL/min   GFR calc Af Amer >90  >90 mL/min   Comment:            The eGFR has been calculated     using the CKD EPI equation.     This calculation has  not been     validated in all clinical     situations.     eGFR's persistently     <90 mL/min signify     possible Chronic Kidney Disease.  VALPROIC ACID LEVEL     Status: None   Collection Time    02/06/13 12:09 AM      Result Value Range   Valproic Acid Lvl 59.8  50.0 - 100.0 ug/mL  URINALYSIS, ROUTINE W REFLEX MICROSCOPIC     Status: Abnormal   Collection Time    02/06/13  3:09 AM      Result Value Range   Color, Urine YELLOW  YELLOW   APPearance CLOUDY (*) CLEAR   Specific Gravity, Urine 1.009  1.005 - 1.030   pH 7.0  5.0 - 8.0   Glucose, UA NEGATIVE  NEGATIVE mg/dL   Hgb urine dipstick TRACE (*) NEGATIVE   Bilirubin Urine NEGATIVE  NEGATIVE   Ketones, ur NEGATIVE  NEGATIVE mg/dL   Protein, ur NEGATIVE  NEGATIVE mg/dL   Urobilinogen, UA 1.0  0.0 - 1.0 mg/dL   Nitrite POSITIVE (*) NEGATIVE   Leukocytes, UA LARGE (*) NEGATIVE  URINE MICROSCOPIC-ADD ON     Status: Abnormal   Collection Time    02/06/13  3:09 AM      Result Value Range   Squamous Epithelial / LPF RARE  RARE   WBC, UA 21-50  <3 WBC/hpf   RBC / HPF 0-2  <3 RBC/hpf   Bacteria, UA MANY (*) RARE    Dg Chest Portable 1 View  02/05/2013  *RADIOLOGY REPORT*  Clinical Data: Fever.  PORTABLE CHEST - 1 VIEW  Comparison: 03/28/2012  Findings: Heart size and pulmonary vascularity are normal and the lungs are clear.  No osseous abnormality.  IMPRESSION: Normal chest.   Original Report Authenticated By: Francene Boyers, M.D.      Assessment/Plan: 42 YO male with known seizure disorder now presenting to hospital with breakthrough seizure in setting of UTI.  Depakote level 59.8 (however albumin has not been obtained). Patient now alert and able to converse but appears slightly confused and perseverating on his recent infection. Patient has not yet received his IV keppra load and I have changed order to STAT followed by Keppra 500 mg BID and his home dose of Depakote.   Plan: 1) EEG 2) STAT IV Keppra now  followed by 500 mg  BID 3) Continue home dose of Depakote 4) Treat underlying UTI.  Assessment and plan discussed with with attending physician and they are in agreement.    Felicie Morn PA-C [facility] [phone]  02/06/2013, 8:34 AM   I have seen and evaluated the patient. I have reviewed the above note and made appropriate changes. On my history, he is slightly clearer and states that he has been having "memory lapses" that brought him into the hospital. His Depakote is theraputic and he has a low albumin, so I would favor continuing keppra as a second agent rather than increasing depakote.   Though his UTI was a likely precipitant of the seizures, but would still add second agent as would not want minor infections triggering seizures in the future.   Russell Slot, MD [facility] [phone]  If 7pm- 7am, please page neurology on call at [phone].

## 2013-02-06 NOTE — ED Notes (Signed)
Attempted to call report, no answer

## 2013-02-07 DIAGNOSIS — F17200 Nicotine dependence, unspecified, uncomplicated: Secondary | ICD-10-CM

## 2013-02-07 DIAGNOSIS — S14101A Unspecified injury at C1 level of cervical spinal cord, initial encounter: Secondary | ICD-10-CM

## 2013-02-07 LAB — CBC
HCT: 45.6 % (ref 39.0–52.0)
Hemoglobin: 15.9 g/dL (ref 13.0–17.0)
MCH: 31.4 pg (ref 26.0–34.0)
MCHC: 34.9 g/dL (ref 30.0–36.0)
MCV: 90.1 fL (ref 78.0–100.0)
Platelets: 159 10*3/uL (ref 150–400)
RBC: 5.06 MIL/uL (ref 4.22–5.81)
RDW: 13.3 % (ref 11.5–15.5)
WBC: 9.1 10*3/uL (ref 4.0–10.5)

## 2013-02-07 LAB — BASIC METABOLIC PANEL
BUN: 13 mg/dL (ref 6–23)
BUN: 13 mg/dL (ref 6–23)
CO2: 29 mEq/L (ref 19–32)
CO2: 25 mEq/L (ref 19–32)
Calcium: 9 mg/dL (ref 8.4–10.5)
Calcium: 9 mg/dL (ref 8.4–10.5)
Chloride: 105 mEq/L (ref 96–112)
Chloride: 104 mEq/L (ref 96–112)
Creatinine, Ser: 0.9 mg/dL (ref 0.50–1.35)
Creatinine, Ser: 0.87 mg/dL (ref 0.50–1.35)
GFR calc Af Amer: >90 mL/min (ref >90)
GFR calc Af Amer: >90 mL/min (ref >90)
GFR calc non Af Amer: >90 mL/min (ref >90)
GFR calc non Af Amer: >90 mL/min (ref >90)
Glucose, Bld: 73 mg/dL (ref 70–99)
Glucose, Bld: 86 mg/dL (ref 70–99)
Potassium: 5.4 mEq/L — ABNORMAL HIGH (ref 3.5–5.1)
Potassium: 4.2 mEq/L (ref 3.5–5.1)
Sodium: 140 mEq/L (ref 135–145)
Sodium: 136 mEq/L (ref 135–145)

## 2013-02-07 LAB — TROPONIN I: Troponin I: <0.3 ng/mL (ref <0.30)

## 2013-02-07 MED ORDER — DIVALPROEX SODIUM ER 500 MG PO TB24
500.0000 mg | ORAL_TABLET | Freq: Two times a day (BID) | ORAL | Status: DC
  Administered 2013-02-07 – 2013-02-08 (×2): 500 mg via ORAL
  Filled 2013-02-07 (×5): qty 1

## 2013-02-07 MED ORDER — LEVETIRACETAM 500 MG PO TABS
500.0000 mg | ORAL_TABLET | Freq: Two times a day (BID) | ORAL | Status: DC
  Filled 2013-02-07 (×4): qty 1

## 2013-02-07 NOTE — Progress Notes (Signed)
Patient stated to lab staff that he "did not feel comfortable and would like to be transferred to [facility] in the AM." NT and RN went to patient's room to follow up with this comment and asked the patient what we could do to take better care of him and make him feel more comfortable. Patient stated that he did not need anything but stated,  "I just do not feel comfortable." We offered to turn patient, offered toileting, food/beverages, and continued to ask him what we could do to make him feel more comfortable here. Patient stated that he did not need anything and he wanted to "wait and see in the morning." We gave patient the call bell and told him to call us if he needed anything or if he could think of anything that we could do differently to take better care of him. Will continue to monitor. Leeroy Cha

## 2013-02-07 NOTE — Care Management (Signed)
   CARE MANAGEMENT NOTE 02/07/2013  Patient:  [name], [name]   Account Number:  [id number]  Date Initiated:  02/06/2013  Documentation initiated by:  Jiles Crocker  Subjective/Objective Assessment:   ADMITTED WITH UTI, SEIZURES     Action/Plan:   PATIENT CONFUSED AT THIS TIME; CM WILL CONTINUE TO FOLLOW FOR DCP   Anticipated DC Date:  02/08/2013   Anticipated DC Plan:  HOME/SELF CARE      DC Planning Services  CM consult      Choice offered to / List presented to:             Status of service:  In process, will continue to follow Medicare Important Message given?  NA - LOS <3 / Initial given by admissions (If response is "NO", the following Medicare IM given date fields will be blank) Date Medicare IM given:   Date Additional Medicare IM given:    Discharge Disposition:    Per UR Regulation:  Reviewed for med. necessity/level of care/duration of stay  If discussed at Long Length of Stay Meetings, dates discussed:    Comments:  02/07/13 Spoke with patient at bedside to discuss discharge planning needs. He is currently alert and oriented. He reports he lives with his mother and his neice. Reports he has plenty of help at home and denies having any home health needs at this time. In fact, he report his twin brother is about to bring his wheelchair to the room so he can move about. No further needs assessed. ATIKAHALL,RNCM [phone]    4/18/2014Abelino Derrick RN,BSN,MHA

## 2013-02-07 NOTE — Progress Notes (Addendum)
Patient ID: [name], male   DOB: [birth year], 42 y.o.   MRN: [id number] [facility] PROGRESS NOTE  [name] [id number] DOB: Jun 27, 1971 DOA: 02/05/2013 PCP: Provider Not In System  Brief narrative: Patient is 42 year old male who has history of seizures and who presents to [place] long ED with altered mental status after an episode of seizure. At the time of admission he was unable to provide history.  Principal Problem:   Fever - Likely secondary to urinary tract infection, continue Rocephin day #2/5 - Afebrile for 24 hours Active Problems:   Cervical spinal cord injury - Continue analgesia as needed for pain control   Seizures - UTI possibly underlying etiology for this current episode of seizure - Appreciate neurology recommendations, followup on EEG   UTI (lower urinary tract infection) - Rocephin as noted above day 2/5   Bradycardia - Unclear etiology but patient entirely asymptomatic, denies chest pain or shortness of breath - Discussed with cardiology on call, no further recommendation   Hyperkalemia - Will repeat BNP today Consultants:  Neurology  Procedures/Studies:  Dg Chest Portable 1 View 02/05/2013  Normal chest.     [name] W/o Cm 02/06/2013  The examination had to be discontinued prior to completion. No acute infarct.  No intracranial mass effect.   Antibiotics:  Rocephin 02/06/2003  Code Status: Full Family Communication: Pt at bedside Disposition Plan: Home likely in AM  HPI/Subjective: No events overnight.   Objective: Filed Vitals:   02/06/13 0904 02/06/13 1452 02/06/13 2103 02/07/13 0436  BP: 134/80 107/64 120/76 150/97  Pulse: 63 54 65 53  Temp: 97.9 F (36.6 C) 97.5 F (36.4 C) 98.3 F (36.8 C) 97.9 F (36.6 C)  TempSrc: Oral Oral Oral Oral  Resp: 18 18 20 20   Height:  6\' 1"  (1.854 m)    Weight:  67.1 kg (147 lb 14.9 oz)    SpO2: 98% 98% 100% 100%    Intake/Output Summary (Last 24 hours) at 02/07/13  0728 Last data filed at 02/07/13 0438  Gross per 24 hour  Intake   2030 ml  Output   2575 ml  Net   -545 ml    Exam:   General:  Pt is alert, follows commands appropriately, not in acute distress  Cardiovascular: Regular rate and rhythm, S1/S2, no murmurs, no rubs, no gallops  Respiratory: Clear to auscultation bilaterally, no wheezing, no crackles, no rhonchi  Abdomen: Soft, non tender, non distended, bowel sounds present, no guarding  Extremities: No edema, pulses DP and PT palpable bilaterally  Data Reviewed: Basic Metabolic Panel:  Recent Labs Lab 02/05/13 2327 02/06/13 0900 02/07/13 0540  NA 132* 137 140  K 4.2 4.2 5.4*  CL 98 102 105  CO2 25 28 29   GLUCOSE 87 79 73  BUN 12 12 13   CREATININE 0.98 0.98 0.90  CALCIUM 9.0 8.6 9.0   Liver Function Tests:  Recent Labs Lab 02/06/13 0900  AST 12  ALT 8  ALKPHOS 73  BILITOT 0.7  PROT 6.7  ALBUMIN 2.7*    CBC:  Recent Labs Lab 02/05/13 2327 02/06/13 0900 02/07/13 0540  WBC 14.1* 14.5* 9.1  NEUTROABS  --  10.3*  --   HGB 16.1 15.2 15.9  HCT 45.2 43.9 45.6  MCV 88.8 90.0 90.1  PLT 161 164 159   Cardiac Enzymes:  Recent Labs Lab 02/06/13 0900 02/06/13 1656 02/07/13 0007  TROPONINI <0.30 <0.30 <0.30    Scheduled Meds: . cefTRIAXone (ROCEPHIN)  IV  1 g Intravenous Q24H  . divalproex  500 mg Oral BID  . enoxaparin (LOVENOX) injection  40 mg Subcutaneous Q24H  . levETIRAcetam  500 mg Oral BID  . sodium chloride  3 mL Intravenous Q12H   Continuous Infusions: . sodium chloride 75 mL/hr at 02/06/13 2324    Debbora Presto, MD  [facility] Pager [phone]  If 7PM-7AM, please contact night-coverage [email or web address] Password [email or web address] 02/07/2013, 7:28 AM   LOS: 2 days
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
## 2013-02-08 MED ORDER — LEVETIRACETAM 500 MG PO TABS: 500.0000 mg | ORAL_TABLET | Freq: Two times a day (BID) | ORAL | Status: DC

## 2013-02-08 MED ORDER — CIPROFLOXACIN HCL 500 MG PO TABS: 500.0000 mg | ORAL_TABLET | Freq: Two times a day (BID) | ORAL | Status: DC

## 2013-02-08 NOTE — Discharge Summary (Signed)
Physician Discharge Summary  TERI DILTZ RUE:[id number] DOB: 02/23/1971 DOA: 02/05/2013  PCP: Provider Not In System  Admit date: 02/05/2013 Discharge date: 02/08/2013  Recommendations for Outpatient Follow-up:  1. Pt will need to follow up with PCP in 2-3 weeks post discharge 2. Please obtain BMP to evaluate electrolytes and kidney function 3. Please also check CBC to evaluate Hg and Hct levels 4. Please note that Keppra was added to the pt's medication regimen  5. Please also note that pt was discharged on Ciprofloxacin to complete the therapy for UTI for 5 additional days post discharge   Discharge Diagnoses: UTI, seizure secondary to UTI Principal Problem:   Fever Active Problems:   Cervical spinal cord injury   Seizures   UTI (lower urinary tract infection)  Discharge Condition: Stable  Diet recommendation: Heart healthy diet discussed in details   Brief narrative:  Patient is 42 year old male who has history of seizures and who presents to [facility] with altered mental status after an episode of seizure. At the time of admission he was unable to provide history.   Principal Problem:  Fever  - Likely secondary to urinary tract infection, continued Rocephin day #3/5 and switched to Ciprofloxacin for 5 more days - Afebrile for 24 hours  Active Problems:  Cervical spinal cord injury  - Continue analgesia as needed for pain control  Seizures  - UTI possibly underlying etiology for this current episode of seizure  - Appreciate neurology recommendations, EEG unremarkable for acute events  UTI (lower urinary tract infection)  - Rocephin as noted above day 3/5 with switch to Ciprofloxacin  Bradycardia  - Unclear etiology but patient entirely asymptomatic, denies chest pain or shortness of breath  - Discussed with cardiology on call, no further recommendation  Hyperkalemia  - stable and within normal limits thsi AM  Consultants:  Neurology Procedures/Studies:  Dg  Chest Portable 1 View 02/05/2013 Normal chest.  [name] W/o Cm 02/06/2013 The examination had to be discontinued prior to completion. No acute infarct. No intracranial mass effect.  Antibiotics:  Rocephin 02/06/2003 --> 04/20 Ciprofloxacin 4/20 --> 4/24  Code Status: Full  Family Communication: Pt at bedside   Discharge Exam: Filed Vitals:   02/08/13 0628  BP: 145/90  Pulse: 50  Temp: 98 F (36.7 C)  Resp: 18   Filed Vitals:   02/07/13 1212 02/07/13 1453 02/07/13 2205 02/08/13 0628  BP: 141/86 102/50 164/95 145/90  Pulse: 62 91 48 50  Temp: 98.7 F (37.1 C) 97.8 F (36.6 C) 97.8 F (36.6 C) 98 F (36.7 C)  TempSrc: Oral Oral Oral Oral  Resp: 20 18 16 18   Height:      Weight:      SpO2: 99% 98% 100% 99%    General: Pt is alert, follows commands appropriately, not in acute distress Cardiovascular: Regular rate and rhythm, S1/S2 +, no murmurs, no rubs, no gallops Respiratory: Clear to auscultation bilaterally, no wheezing, no crackles, no rhonchi Abdominal: Soft, non tender, non distended, bowel sounds +, no guarding Extremities: no edema, no cyanosis, pulses palpable bilaterally DP and PT  Discharge Instructions  Discharge Orders   Future Appointments Provider Department Dept Phone   02/23/2013 11:15 AM Erick Colace[facility] [phone]   Future Orders Complete By Expires     Diet - low sodium heart healthy  As directed     Increase activity slowly  As directed         Medication  List    STOP taking these medications       sulfamethoxazole-trimethoprim 800-160 MG per tablet  Commonly known as:  SEPTRA DS      TAKE these medications       ciprofloxacin 500 MG tablet  Commonly known as:  CIPRO  Take 1 tablet (500 mg total) by mouth 2 (two) times daily.     divalproex 500 MG 24 hr tablet  Commonly known as:  DEPAKOTE ER  Take 500 mg by mouth 2 (two) times daily. Take 1 tablet at 8 am and 8 pm daily     levETIRAcetam  500 MG tablet  Commonly known as:  KEPPRA  Take 1 tablet (500 mg total) by mouth 2 (two) times daily.           Follow-up Information   Follow up with Provider Not In System In 2 weeks.       The results of significant diagnostics from this hospitalization (including imaging, microbiology, ancillary and laboratory) are listed below for reference.     Microbiology: Recent Results (from the past 240 hour(s))  CULTURE, BLOOD (ROUTINE X 2)     Status: None   Collection Time    02/05/13 11:28 PM      Result Value Range Status   Specimen Description BLOOD LEFT ARM   Final   Special Requests BOTTLES DRAWN AEROBIC AND ANAEROBIC 3CC   Final   Culture  Setup Time 02/06/2013 07:19   Final   Culture     Final   Value:        BLOOD CULTURE RECEIVED NO GROWTH TO DATE CULTURE WILL BE HELD FOR 5 DAYS BEFORE ISSUING A FINAL NEGATIVE REPORT   Report Status PENDING   Incomplete  CULTURE, BLOOD (ROUTINE X 2)     Status: None   Collection Time    02/06/13 12:35 AM      Result Value Range Status   Specimen Description BLOOD LEFT ANTECUBITAL   Final   Special Requests BOTTLES DRAWN AEROBIC AND ANAEROBIC 4.5CC   Final   Culture  Setup Time 02/06/2013 07:18   Final   Culture     Final   Value:        BLOOD CULTURE RECEIVED NO GROWTH TO DATE CULTURE WILL BE HELD FOR 5 DAYS BEFORE ISSUING A FINAL NEGATIVE REPORT   Report Status PENDING   Incomplete  URINE CULTURE     Status: None   Collection Time    02/06/13  3:09 AM      Result Value Range Status   Specimen Description URINE, CATHETERIZED   Final   Special Requests NONE   Final   Culture  Setup Time 02/06/2013 08:39   Final   Colony Count >=100,000 COLONIES/ML   Final   Culture GRAM NEGATIVE RODS   Final   Report Status PENDING   Incomplete     Labs: Basic Metabolic Panel:  Recent Labs Lab 02/05/13 2327 02/06/13 0900 02/07/13 0540 02/07/13 1218  NA 132* 137 140 136  K 4.2 4.2 5.4* 4.2  CL 98 102 105 104  CO2 25 28 29 25    GLUCOSE 87 79 73 86  BUN 12 12 13 13   CREATININE 0.98 0.98 0.90 0.87  CALCIUM 9.0 8.6 9.0 9.0   Liver Function Tests:  Recent Labs Lab 02/06/13 0900  AST 12  ALT 8  ALKPHOS 73  BILITOT 0.7  PROT 6.7  ALBUMIN 2.7*   CBC:  Recent Labs Lab 02/05/13  2327 02/06/13 0900 02/07/13 0540  WBC 14.1* 14.5* 9.1  NEUTROABS  --  10.3*  --   HGB 16.1 15.2 15.9  HCT 45.2 43.9 45.6  MCV 88.8 90.0 90.1  PLT 161 164 159   Cardiac Enzymes:  Recent Labs Lab 02/06/13 0900 02/06/13 1656 02/07/13 0007  TROPONINI <0.30 <0.30 <0.30   SIGNED: Time coordinating discharge: Over 30 minutes  Debbora Presto, MD  [facility] 02/08/2013, 11:09 AM Pager [phone]  If 7PM-7AM, please contact night-coverage [email or web address] Password [email or web address]

## 2013-02-10 LAB — URINE CULTURE: Colony Count: >=100000

## 2013-02-12 LAB — CULTURE, BLOOD (ROUTINE X 2)
Culture: NO GROWTH
Culture: NO GROWTH

## 2013-02-23 ENCOUNTER — Ambulatory Visit (HOSPITAL_BASED_OUTPATIENT_CLINIC_OR_DEPARTMENT_OTHER): Payer: Medicare Other | Admitting: Physical Medicine & Rehabilitation

## 2013-02-23 ENCOUNTER — Encounter: Payer: Self-pay | Admitting: Physical Medicine & Rehabilitation

## 2013-02-23 ENCOUNTER — Encounter: Payer: Medicare Other | Attending: Physical Medicine & Rehabilitation

## 2013-02-23 VITALS — BP 129/66 | HR 81 | Resp 14 | Wt 145.0 lb

## 2013-02-23 DIAGNOSIS — R259 Unspecified abnormal involuntary movements: Secondary | ICD-10-CM | POA: Insufficient documentation

## 2013-02-23 DIAGNOSIS — G8250 Quadriplegia, unspecified: Secondary | ICD-10-CM | POA: Insufficient documentation

## 2013-02-23 DIAGNOSIS — S14109D Unspecified injury at unspecified level of cervical spinal cord, subsequent encounter: Secondary | ICD-10-CM

## 2013-02-23 DIAGNOSIS — K592 Neurogenic bowel, not elsewhere classified: Secondary | ICD-10-CM | POA: Insufficient documentation

## 2013-02-23 DIAGNOSIS — N319 Neuromuscular dysfunction of bladder, unspecified: Secondary | ICD-10-CM | POA: Insufficient documentation

## 2013-02-23 DIAGNOSIS — Z5189 Encounter for other specified aftercare: Secondary | ICD-10-CM

## 2013-02-23 NOTE — Patient Instructions (Addendum)
Call urologist to see how often you need to catheterize as well as what type of antibiotics to take and on what schedule   May stretch Recommend standing frame 15 minutes per day workup gradually by 5 minutes per week to a goal of 60 minutes per day

## 2013-02-23 NOTE — Progress Notes (Signed)
Subjective:    Patient ID: [name], male    DOB: [birth year], 42 y.o.   MRN: [id number]  HPI Had another UTI With fever that triggered seizures. Short hospitalization. Current bladder management he performs intermittent catheterization but only at night. He states he voids during the day. Pain Inventory Average Pain 8 Pain Right Now 8 My pain is other  In the last 24 hours, has pain interfered with the following? General activity 7 Relation with others 5 Enjoyment of life 2 What TIME of day is your pain at its worst? morning Sleep (in general) Good  Pain is worse with: sitting Pain improves with: no comment made Relief from Meds: 10  Mobility use a wheelchair  Function disabled: date disabled .  Neuro/Psych No problems in this area  Prior Studies Any changes since last visit?  no  Physicians involved in your care Any changes since last visit?  no   Family History  Problem Relation Age of Onset  . Hypertension Mother    History   Social History  . Marital Status: Single    Spouse Name: N/A    Number of Children: N/A  . Years of Education: N/A   Social History Main Topics  . Smoking status: Current Every Day Smoker -- 1.00 packs/day for 21 years    Types: Cigarettes  . Smokeless tobacco: Never Used  . Alcohol Use: No  . Drug Use: No  . Sexually Active: No   Other Topics Concern  . None   Social History Narrative  . None   Past Surgical History  Procedure Laterality Date  . Spine surgery    . Bladder surgery    . Incision and drainage perirectal abscess     Past Medical History  Diagnosis Date  . Tobacco use 03/20/2012  . C5 spinal cord injury   . Seizures     focal   BP 129/66  Pulse 81  Resp 14  Wt 145 lb (65.772 kg)  BMI 19.13 kg/m2  SpO2 97%    Review of Systems  All other systems reviewed and are negative.       Objective:   Physical Exam  Constitutional: He is oriented to person, place, and time. He appears  well-developed and well-nourished.  HENT:  Head: Normocephalic and atraumatic.  Musculoskeletal:  Right ankle: He exhibits decreased range of motion.  Left ankle: He exhibits decreased range of motion.  Neurological: He is alert and oriented to person, place, and time. He displays atrophy and abnormal reflex. No sensory deficit. He exhibits abnormal muscle tone. Gait abnormal.  Reflex Scores:  Patellar reflexes are 4+ on the right side and 4+ on the left side.  Achilles reflexes are 4+ on the right side and 4+ on the left side. Clonus at bilat knee ext and ankle DF  Motor strength is 4/5 bilateral deltoid, biceps, triceps wrist extensors 2 minus in the left hand intrinsics and 1/5 in the right hand intrinsics         Assessment & Plan:  1. 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 Stable deficits. Needs new wheelchair. Will make referral to physical therapy who will in turn work with the equipment supplier. He has spasticity but does not wish to have any type spasticity medication. May stretch Recommend standing frame 15 minutes per day workup gradually by 5 minutes per week to a goal of 60 minutes per day 2. Neurogenic bowel and bladder. I've asked him to followup with his urologist. He seems  confused about his bladder management. He is wondering whether he needs to be on Cipro on a every 3 month schedule for a week or 2. I told him I have not heard of this type of management. He also thought that  Iced tea was what caused his UTI. I discussed that it is due to his spinal cord injury and abnormal voiding. RTC 6 months

## 2013-03-06 ENCOUNTER — Telehealth: Payer: Self-pay

## 2013-03-06 NOTE — Telephone Encounter (Signed)
Hailey with [employer] called to get verbal order for gloves.  Left message for patient to verify he requested this.

## 2013-03-10 NOTE — Telephone Encounter (Signed)
Left message for patient to call office to clarify that he is requesting gloves from [employer].

## 2013-03-11 ENCOUNTER — Telehealth: Payer: Self-pay | Admitting: *Deleted

## 2013-03-11 NOTE — Telephone Encounter (Signed)
Returning call. He does need gloves so please call the company.

## 2013-03-12 NOTE — Telephone Encounter (Signed)
Called to give verbal orders but they have already received the paperwork from [place].

## 2013-03-27 IMAGING — CR DG CHEST 1V PORT
1 series · 1 of 1 positions shown · non-contrast
Comparison: 02/12/2011

CLINICAL DATA: PICC line placement

PORTABLE CHEST - 1 VIEW

[AP]
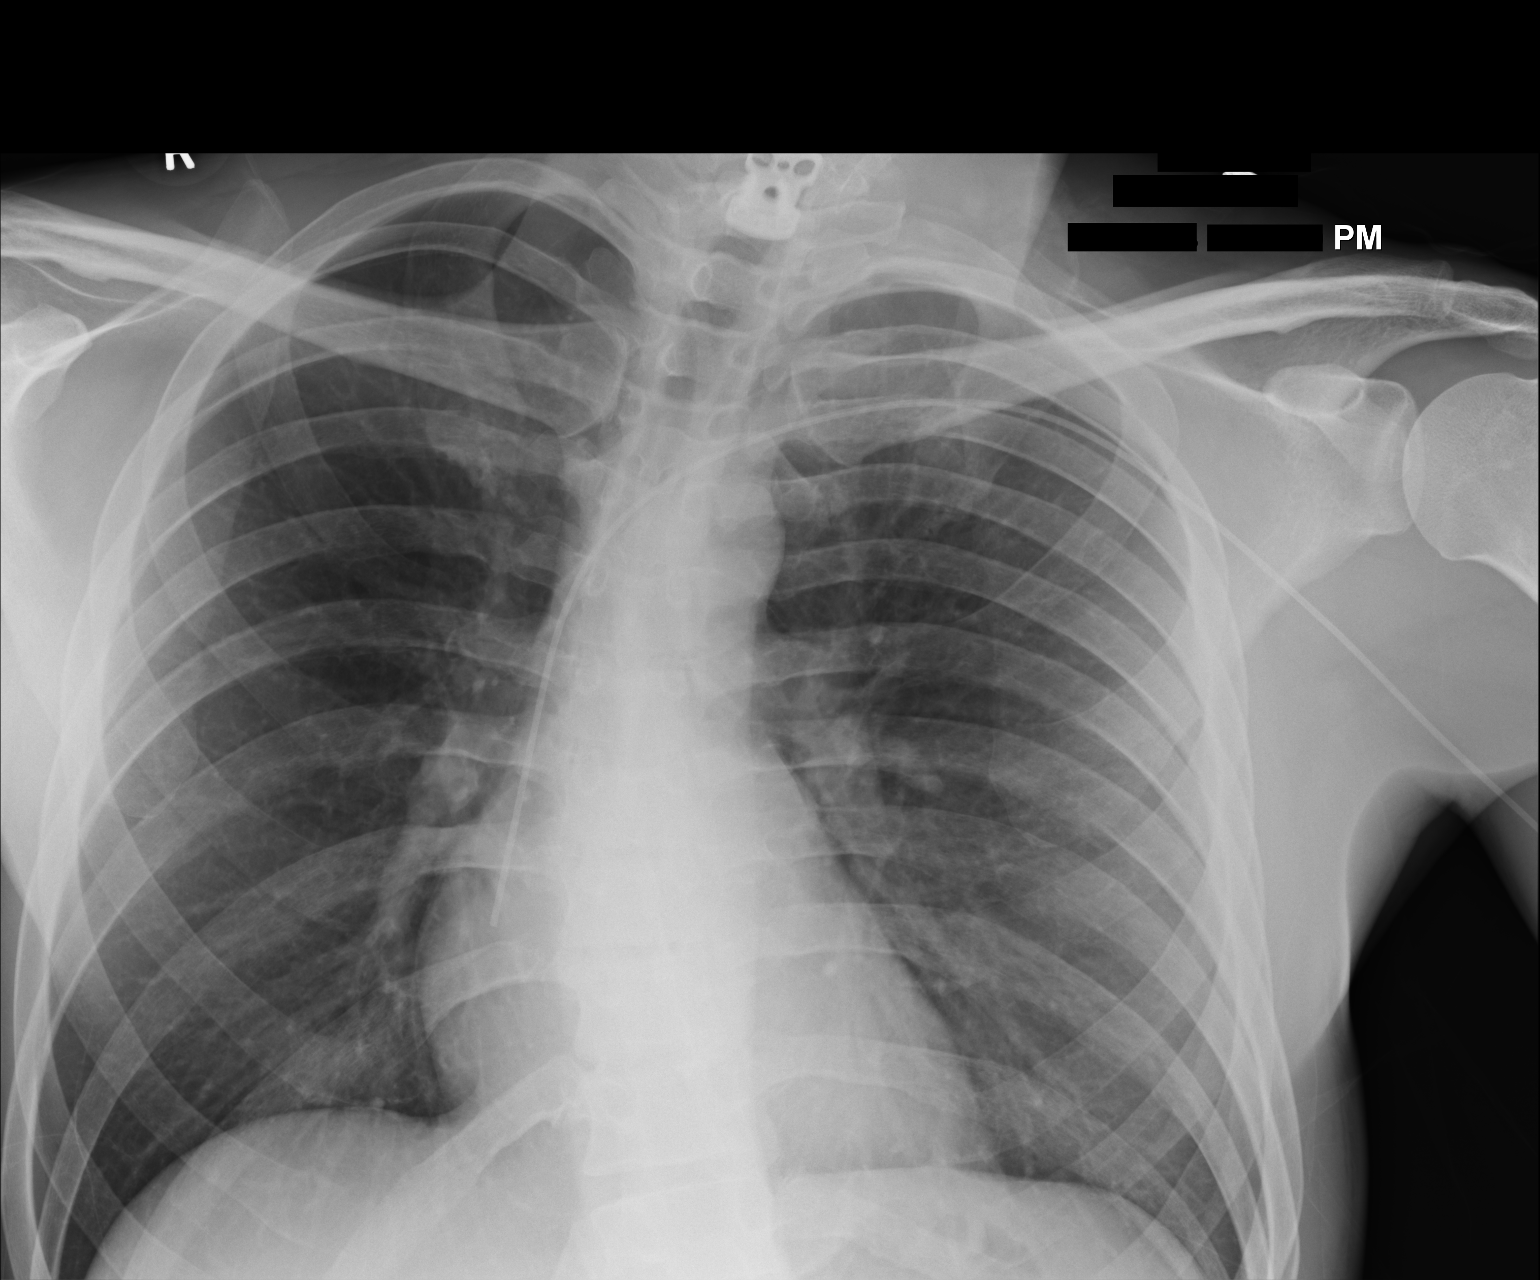

[1 of 1 positions shown; findings below may reference images not displayed]

FINDINGS: Left arm PICC with its tip at/just below the cavoatrial
junction.

Lungs are clear. No pleural effusion or pneumothorax.

Cardiomediastinal silhouette is within normal limits.

Cervical spine fixation hardware.
IMPRESSION: Left arm PICC with its tip at/just below the cavoatrial junction.

No evidence of acute cardiopulmonary disease.

## 2013-04-23 ENCOUNTER — Telehealth (INDEPENDENT_AMBULATORY_CARE_PROVIDER_SITE_OTHER): Payer: Self-pay | Admitting: *Deleted

## 2013-04-23 NOTE — Telephone Encounter (Signed)
Patient called to ask for a letter stating medical necessity for pressure pads to be used on the area patient states he had surgery on about a year ago for his work and his insurance company to continue paying for.  This RN see's where patient had I&D in May 2013.  Spoke to [facility] who will check with Dr. Dwain Sarna next week on it.  Patient aware of plan and need to speak with Dwain Sarna MD.  Patient agreeable at this time.

## 2013-06-07 ENCOUNTER — Emergency Department (HOSPITAL_COMMUNITY)
Admission: EM | Admit: 2013-06-07 | Discharge: 2013-06-07 | Disposition: A | Discharge status: Home / Self Care | Payer: Medicare Other | Attending: Emergency Medicine | Admitting: Emergency Medicine

## 2013-06-07 ENCOUNTER — Encounter (HOSPITAL_COMMUNITY): Payer: Self-pay | Admitting: Nurse Practitioner

## 2013-06-07 DIAGNOSIS — Z79899 Other long term (current) drug therapy: Secondary | ICD-10-CM | POA: Insufficient documentation

## 2013-06-07 DIAGNOSIS — Z8781 Personal history of (healed) traumatic fracture: Secondary | ICD-10-CM | POA: Insufficient documentation

## 2013-06-07 DIAGNOSIS — F17200 Nicotine dependence, unspecified, uncomplicated: Secondary | ICD-10-CM | POA: Insufficient documentation

## 2013-06-07 DIAGNOSIS — A499 Bacterial infection, unspecified: Secondary | ICD-10-CM | POA: Insufficient documentation

## 2013-06-07 DIAGNOSIS — G40909 Epilepsy, unspecified, not intractable, without status epilepticus: Secondary | ICD-10-CM | POA: Insufficient documentation

## 2013-06-07 DIAGNOSIS — B9689 Other specified bacterial agents as the cause of diseases classified elsewhere: Secondary | ICD-10-CM | POA: Insufficient documentation

## 2013-06-07 DIAGNOSIS — N390 Urinary tract infection, site not specified: Secondary | ICD-10-CM | POA: Insufficient documentation

## 2013-06-07 LAB — CBC WITH DIFFERENTIAL/PLATELET
Basophils Absolute: 0 10*3/uL (ref 0.0–0.1)
Basophils Relative: 0 % (ref 0–1)
Eosinophils Absolute: 0 10*3/uL (ref 0.0–0.7)
Eosinophils Relative: 1 % (ref 0–5)
HCT: 47.3 % (ref 39.0–52.0)
Hemoglobin: 16.8 g/dL (ref 13.0–17.0)
Lymphocytes Relative: 29 % (ref 12–46)
Lymphs Abs: 2 10*3/uL (ref 0.7–4.0)
MCH: 31.8 pg (ref 26.0–34.0)
MCHC: 35.5 g/dL (ref 30.0–36.0)
MCV: 89.4 fL (ref 78.0–100.0)
Monocytes Absolute: 0.5 10*3/uL (ref 0.1–1.0)
Monocytes Relative: 6 % (ref 3–12)
Neutro Abs: 4.6 10*3/uL (ref 1.7–7.7)
Neutrophils Relative %: 64 % (ref 43–77)
Platelets: 185 10*3/uL (ref 150–400)
RBC: 5.29 MIL/uL (ref 4.22–5.81)
RDW: 12.8 % (ref 11.5–15.5)
WBC: 7.1 10*3/uL (ref 4.0–10.5)

## 2013-06-07 LAB — URINALYSIS, ROUTINE W REFLEX MICROSCOPIC
Bilirubin Urine: NEGATIVE
Glucose, UA: NEGATIVE mg/dL
Hgb urine dipstick: NEGATIVE
Ketones, ur: NEGATIVE mg/dL
Nitrite: POSITIVE — AB
Protein, ur: NEGATIVE mg/dL
Specific Gravity, Urine: 1.018 (ref 1.005–1.030)
Urobilinogen, UA: 1 mg/dL (ref 0.0–1.0)
pH: 6.5 (ref 5.0–8.0)

## 2013-06-07 LAB — BASIC METABOLIC PANEL
BUN: 17 mg/dL (ref 6–23)
CO2: 26 mEq/L (ref 19–32)
Calcium: 9.2 mg/dL (ref 8.4–10.5)
Chloride: 101 mEq/L (ref 96–112)
Creatinine, Ser: 0.9 mg/dL (ref 0.50–1.35)
GFR calc Af Amer: >90 mL/min (ref >90)
GFR calc non Af Amer: >90 mL/min (ref >90)
Glucose, Bld: 89 mg/dL (ref 70–99)
Potassium: 4.4 mEq/L (ref 3.5–5.1)
Sodium: 136 mEq/L (ref 135–145)

## 2013-06-07 LAB — URINE MICROSCOPIC-ADD ON

## 2013-06-07 LAB — VALPROIC ACID LEVEL: Valproic Acid Lvl: 42.5 ug/mL — ABNORMAL LOW (ref 50.0–100.0)

## 2013-06-07 MED ORDER — CIPROFLOXACIN HCL 500 MG PO TABS: 500.0000 mg | ORAL_TABLET | Freq: Two times a day (BID) | ORAL | Status: DC

## 2013-06-07 MED ORDER — CIPROFLOXACIN HCL 500 MG PO TABS
500.0000 mg | ORAL_TABLET | Freq: Once | ORAL | Status: AC
  Administered 2013-06-07: 500 mg via ORAL
  Filled 2013-06-07: qty 1

## 2013-06-07 MED ORDER — DIVALPROEX SODIUM 500 MG PO DR TAB
500.0000 mg | DELAYED_RELEASE_TABLET | Freq: Two times a day (BID) | ORAL | Status: DC
  Administered 2013-06-07: 500 mg via ORAL
  Filled 2013-06-07: qty 1
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
## 2013-06-07 NOTE — ED Notes (Signed)
Seizure pads applied to bed rails
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
## 2013-06-07 NOTE — ED Notes (Addendum)
Waiting for Depakote 500mg  tablet to arrive from pharmacy.  (pharmacy alerted over phone)

## 2013-06-09 LAB — URINE CULTURE: Colony Count: >=100000

## 2013-06-16 ENCOUNTER — Telehealth: Payer: Self-pay | Admitting: *Deleted

## 2013-06-16 NOTE — Telephone Encounter (Addendum)
Dawn called from [facility] Dept and is needing refill on depakote 500mg  1 po bid # [id number] for [phone]. Pt needs an appt with Dr. Vickey Huger. (seen CM/NP last 2 times.).

## 2013-06-24 ENCOUNTER — Other Ambulatory Visit: Payer: Self-pay

## 2013-06-24 ENCOUNTER — Telehealth: Payer: Self-pay | Admitting: *Deleted

## 2013-06-24 MED ORDER — DIVALPROEX SODIUM ER 500 MG PO TB24: 500.0000 mg | ORAL_TABLET | Freq: Two times a day (BID) | ORAL | Status: DC

## 2013-06-24 NOTE — Telephone Encounter (Signed)
Dawn called, left message stating patient needs meds today.  I see a previous note in the chart showing patient needs appt.  I will send one refill while waiting for appt to be scheduled so patient is not without medication.
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
## 2013-06-26 ENCOUNTER — Encounter: Payer: Medicare Other | Attending: Physical Medicine & Rehabilitation

## 2013-06-26 ENCOUNTER — Encounter: Payer: Self-pay | Admitting: Physical Medicine & Rehabilitation

## 2013-06-26 ENCOUNTER — Ambulatory Visit (HOSPITAL_BASED_OUTPATIENT_CLINIC_OR_DEPARTMENT_OTHER): Payer: Medicare Other | Admitting: Physical Medicine & Rehabilitation

## 2013-06-26 VITALS — BP 104/71 | HR 79 | Ht 73.0 in | Wt 145.0 lb

## 2013-06-26 DIAGNOSIS — N319 Neuromuscular dysfunction of bladder, unspecified: Secondary | ICD-10-CM | POA: Insufficient documentation

## 2013-06-26 DIAGNOSIS — IMO0002 Reserved for concepts with insufficient information to code with codable children: Secondary | ICD-10-CM

## 2013-06-26 DIAGNOSIS — K592 Neurogenic bowel, not elsewhere classified: Secondary | ICD-10-CM | POA: Insufficient documentation

## 2013-06-26 DIAGNOSIS — X58XXXS Exposure to other specified factors, sequela: Secondary | ICD-10-CM | POA: Insufficient documentation

## 2013-06-26 DIAGNOSIS — G8250 Quadriplegia, unspecified: Secondary | ICD-10-CM | POA: Insufficient documentation

## 2013-06-26 NOTE — Patient Instructions (Signed)
Stay as active as tolerated. 

## 2013-06-26 NOTE — Progress Notes (Signed)
Subjective:    Patient ID: [name], male    DOB: [birth year], 42 y.o.   MRN: [id number]  HPI The patient is a 42 year old  male, who presents with quadroplegia . The symptoms started  Dec 1999,when he fell from a lift, from about 20 feet height, and fractured his C6. The patient does not have any pain . He reports that he has received a new wheel chair, with better padding. He also reports that he has an open wound in his anal area,which is healing, he was at the wound care center. Otherwise the patient is doing considerably well.  Pain Inventory Average Pain 0 Pain Right Now 0 My pain is no pain  In the last 24 hours, has pain interfered with the following? General activity 10 Relation with others 10 Enjoyment of life 0 What TIME of day is your pain at its worst? no pain Sleep (in general) Good  Pain is worse with: no pain Pain improves with: no pain Relief from Meds: no pain  Mobility do you drive?  yes use a wheelchair  Function disabled: date disabled .  Neuro/Psych No problems in this area  Prior Studies Any changes since last visit?  no  Physicians involved in your care Any changes since last visit?  no   Family History  Problem Relation Age of Onset  . Hypertension Mother    History   Social History  . Marital Status: Single    Spouse Name: N/A    Number of Children: N/A  . Years of Education: N/A   Social History Main Topics  . Smoking status: Current Every Day Smoker -- 1.00 packs/day for 21 years    Types: Cigarettes  . Smokeless tobacco: Never Used  . Alcohol Use: No  . Drug Use: No  . Sexual Activity: None   Other Topics Concern  . None   Social History Narrative  . None   Past Surgical History  Procedure Laterality Date  . Spine surgery    . Bladder surgery    . Incision and drainage perirectal abscess     Past Medical History  Diagnosis Date  . Tobacco use 03/20/2012  . C5 spinal cord injury   . Seizures     focal    BP 104/71  Pulse 79  Ht 6\' 1"  (1.854 m)  Wt 145 lb (65.772 kg)  BMI 19.13 kg/m2  SpO2 96%   Review of Systems  All other systems reviewed and are negative.       Objective:   Physical Exam Symmetric normal motor tone is noted in UE bilateral, spastic muscle tone in LEs  . Normal muscle bulk in UEs. Muscle testing reveals 5/5 muscle strength of the upper extremity, except intrinsic hand muscles and finger muscles of right hand 3/5, and left hand 4-/5, and 0/5 of the lower extremity. Full range of motion in upper , lower not tested because of producing spasticity.  Fine motor movements are restricted in both hands. . DTR in the lower extremity are present 3+.Sustained Clonus is noted bilateral.  Patient in wheel chair ,some  difficulties with upper body stability        Assessment & Plan:  1. C 6 fracture in 1999, quadriparesis. Has a new wheelchair, and better padding Continue with being active and exercising   2. Neurogenic bowel and bladder. I've asked him, also, to followup with his urologist, he has not done this since he last saw Dr. Doroteo Bradford. He  seems confused about his bladder management. He is wondering whether he needs to be on Cipro on a every 3 month schedule for a week or 2.  3. Hx of Seizures, is following up with neurology, takes depakote 500mg  bid RTC, has appointment with Dr. Doroteo Bradford in November

## 2013-07-28 NOTE — Telephone Encounter (Signed)
Patient sched for follow-up appt. With Dr Vickey Huger...[name]

## 2013-07-30 ENCOUNTER — Other Ambulatory Visit: Payer: Self-pay

## 2013-07-30 MED ORDER — DIVALPROEX SODIUM ER 500 MG PO TB24: 500.0000 mg | ORAL_TABLET | Freq: Two times a day (BID) | ORAL | Status: DC

## 2013-08-07 ENCOUNTER — Telehealth: Payer: Self-pay

## 2013-08-07 NOTE — Telephone Encounter (Signed)
Patient came into the office saying [facility] told him we did not send his Rx for Depakote.  We have a faxed confirmation it was sent on 10/09 at 4:02pm.  I have re-faxed it again today per patient request.  Confirmation 5:04pm.  Tried calling them at [phone], but just get a VM, no person.  Patient says he has been having trouble getting meds through them for several months.  He has an appt here next week.

## 2013-08-12 ENCOUNTER — Encounter: Payer: Self-pay | Admitting: Neurology

## 2013-08-12 ENCOUNTER — Ambulatory Visit (INDEPENDENT_AMBULATORY_CARE_PROVIDER_SITE_OTHER): Payer: Medicare Other | Admitting: Neurology

## 2013-08-12 VITALS — BP 100/67 | HR 76 | Resp 18

## 2013-08-12 DIAGNOSIS — G40309 Generalized idiopathic epilepsy and epileptic syndromes, not intractable, without status epilepticus: Secondary | ICD-10-CM | POA: Insufficient documentation

## 2013-08-12 DIAGNOSIS — G8250 Quadriplegia, unspecified: Secondary | ICD-10-CM

## 2013-08-12 DIAGNOSIS — G40802 Other epilepsy, not intractable, without status epilepticus: Secondary | ICD-10-CM

## 2013-08-12 HISTORY — DX: Quadriplegia, unspecified: G82.50

## 2013-08-12 HISTORY — DX: Other epilepsy, not intractable, without status epilepticus: G40.802

## 2013-08-12 MED ORDER — DIVALPROEX SODIUM ER 500 MG PO TB24: 500.0000 mg | ORAL_TABLET | Freq: Two times a day (BID) | ORAL | Status: DC

## 2013-08-12 NOTE — Progress Notes (Signed)
[facility]  Provider:  Melvyn Novas, M D  Referring Provider: No ref. provider found Primary Care Physician:  Provider Not In System  Chief Complaint  Patient presents with  . F/U Epilepsy    HPI:  [name] is a 42 y.o. male  Is seen here as a referral  from Dr. Doroteo Bradford.   Still uses a 42 year old African American, right-handed male. He has a history of quadriplegia the symptoms started after an accident in 1999 when he fell from a left from a 20 foot height. Etches the sixth cervical vertebrae he has been wheelchair-bound since. The patient does not have any pain or spasticity but he has an open wound in his anal area which is only slowly healing. The patient's deficits involves the seventh cervical nerve downwards. He has a new wheelchair about having he continues to be active and exercising his driving to handicap basketball games he has neurogenic bladder and bowel are all continues  Recently he forgot to take his Depakote 500 ohms twice a day on shed you'll and in response seems to have suffered a seizure in PM , that was witnessed by his 17 year old daughter. She called EMS.   He woke with nocturia in the morning, and missed hours of intermitted time.   Review of Systems: Out of a complete 14 system review, the patient complains of only the following symptoms, and all other reviewed systems are negative.  focal seizure , generalized seizures were not reported.  He indicated that he has a lower seizure threshold when insomnic. He is a smoker.  He has an anal fissure , slowly healing, ongoing incontinence.   History   Social History  . Marital Status: Single    Spouse Name: N/A    Number of Children: N/A  . Years of Education: N/A   Occupational History  . Disable    Social History Main Topics  . Smoking status: Current Every Day Smoker -- 1.00 packs/day for 21 years    Types: Cigarettes  . Smokeless tobacco: Never Used  . Alcohol Use: No   . Drug Use: No  . Sexual Activity: Not on file   Other Topics Concern  . Not on file   Social History Narrative  . No narrative on file    Family History  Problem Relation Age of Onset  . Hypertension Mother     Past Medical History  Diagnosis Date  . Tobacco use 03/20/2012  . C5 spinal cord injury   . Seizures     focal    Past Surgical History  Procedure Laterality Date  . Spine surgery    . Bladder surgery    . Incision and drainage perirectal abscess      Current Outpatient Prescriptions  Medication Sig Dispense Refill  . divalproex (DEPAKOTE ER) 500 MG 24 hr tablet Take 1 tablet (500 mg total) by mouth 2 (two) times daily. Take 1 tablet at 8 am and 8 pm daily  60 tablet  1   No current facility-administered medications for this visit.    Allergies as of 08/12/2013  . (No Known Allergies)    Vitals: BP 100/67  Pulse 76  Resp 18 Last Weight:  Wt Readings from Last 1 Encounters:  06/26/13 145 lb (65.772 kg)   Last Height:   Ht Readings from Last 1 Encounters:  06/26/13 6\' 1"  (1.854 m)    Physical exam:  General: The patient is awake, alert and appears not  in acute distress. The patient is well groomed. Head: Normocephalic, atraumatic. Neck is supple. Mallampati 2, neck circumference: 13 inches, no nasal deviation, no Retrognathia.  Cardiovascular:  Regular rate and rhythm, without  murmurs or carotid bruit, and without distended neck veins. Respiratory: Lungs are clear to auscultation. Skin:  Without evidence of edema, or rash Trunk: slender .  Neurologic exam : The patient is awake and alert, oriented to place and time.  Memory subjective described as impaired . There is a reduced  attention span & concentration ability, his speech is pressured.   Speech is fluent without  dysarthria, dysphonia or aphasia.  Mood and affect are appropriate.  Cranial nerves: Pupils are equal and briskly reactive to light. Funduscopic exam without  evidence of  pallor or edema. Extraocular movements  in vertical and horizontal planes intact and without nystagmus. Visual fields by finger perimetry are intact. Hearing to finger rub intact.  Facial sensation intact to fine touch. Facial motor strength is symmetric and tongue and uvula move midline.   Weakness of interosseus muscles in either hand, atrophy.   Right hand 3 fingers (lateral)  He has weak pinch strength in the right.  Right biceps and triceps are strong, the left triceps is smaller and weaker, the biceps preserved.  No lower extremtiy movement, patient transfers alone from wheelchair to the [place] . Sensory loss C7 on the left . Reflexes aspect I would extremities. His lower extremities showl scissoring and clonus. The upper extremities 3 plus.   Asthma #1 spastic paraparesis incomplete quadriparesis. Restriction of fine motor movement in upper extremities. Old is related to a C5-6 fracture in 1999. Dr Doroteo Bradford.  #2 neurogenic: Bladder followed by urology patient's occipital.  #3 history of seizures he has been on Depakote 5 00 mg twice a day, beginning  Years after the C5-6 lesion.   Plan refill Depakote 500 Mg bid po.

## 2013-08-12 NOTE — Patient Instructions (Addendum)
Seizures You had a seizure. About 2% of the population will have a seizure problem during their lifetime. Sometimes the cause for the seizure is not known. Seizures are usually associated with one of these problems:  Epilepsy.   Not taking your seizure medicine.   Alcohol and drug abuse.   Head injury, strokes, tumors, and brain surgery.   High fever and infections.   Low blood sugar.  Evaluating a new seizure disorder may require having a brain scan or a brain wave test called an EEG. If you have been given a seizure medicine, it is very important that you take it as prescribed. Not taking these medicines as directed is the most common cause of seizures. Blood tests are often used to be sure you are taking the proper dose.  Seizures cause many different symptoms, from convulsions to brief blackouts. Do not ride a bike, drive a car, go swimming, climb in high or dangerous places such as ladders or roofs, or operate any dangerous equipment until you have your doctor's permission. If you hold a driver's license, state law may require that a report be made to the motor vehicles department. You should wear an emergency medical identification bracelet with information about your seizures. If you have any warning that a seizure may occur, lie down in a safe place to protect yourself. Teach your family and friends what to do if you have any further seizures. They should stay calm and try to keep you from falling on hard or sharp objects. It is best not to try to restrain a seizing person or to force anything into his or her mouth. Do not try to open clenched jaws. When the seizure is over, the person should be rolled on their side to help drain any vomit or secretions from the mouth. After a seizure, a person may be confused or drowsy for several minutes. An ambulance should be called if the seizure lasted more than 5 minutes or if confusion remains for more than 30 minutes. Call your caregiver or the  emergency department for further instructions. Do not drive until cleared by your caregiver or neurologist! Document Released: 11/15/2004 Document Revised: 06/20/2011 Document Reviewed: 10/08/2005 [facility] Patient Information 2012 [place], [place].Driving and Equipment Restrictions Some medical problems make it dangerous to drive, ride a bike, or use machines. Some of these problems are:  A hard blow to the head (concussion).  Passing out (fainting).  Twitching and shaking (seizures).  Low blood sugar.  Taking medicine to help you relax (sedatives).  Taking pain medicines.  Wearing an eye patch.  Wearing splints. This can make it hard to use parts of your body that you need to drive safely. HOME CARE   Do not drive until your doctor says it is okay.  Do not use machines until your doctor says it is okay. You may need a form signed by your doctor (medical release) before you can drive again. You may also need this form before you do other tasks where you need to be fully alert. MAKE SURE YOU:  Understand these instructions.  Will watch your condition.  Will get help right away if you are not doing well or get worse. Document Released: 11/15/2004 Document Revised: 12/31/2011 Document Reviewed: 02/15/2010 [facility] Patient Information 2014 [place], [place].

## 2013-08-25 ENCOUNTER — Encounter: Payer: Medicare Other | Attending: Physical Medicine & Rehabilitation

## 2013-08-25 ENCOUNTER — Encounter: Payer: Self-pay | Admitting: Physical Medicine & Rehabilitation

## 2013-08-25 ENCOUNTER — Ambulatory Visit (HOSPITAL_BASED_OUTPATIENT_CLINIC_OR_DEPARTMENT_OTHER): Payer: Medicare Other | Admitting: Physical Medicine & Rehabilitation

## 2013-08-25 VITALS — BP 88/41 | HR 89 | Resp 14 | Ht 73.0 in

## 2013-08-25 DIAGNOSIS — IMO0002 Reserved for concepts with insufficient information to code with codable children: Secondary | ICD-10-CM

## 2013-08-25 DIAGNOSIS — N319 Neuromuscular dysfunction of bladder, unspecified: Secondary | ICD-10-CM | POA: Insufficient documentation

## 2013-08-25 DIAGNOSIS — G8250 Quadriplegia, unspecified: Secondary | ICD-10-CM | POA: Insufficient documentation

## 2013-08-25 DIAGNOSIS — K592 Neurogenic bowel, not elsewhere classified: Secondary | ICD-10-CM

## 2013-08-25 DIAGNOSIS — X58XXXS Exposure to other specified factors, sequela: Secondary | ICD-10-CM | POA: Insufficient documentation

## 2013-08-25 DIAGNOSIS — S14109S Unspecified injury at unspecified level of cervical spinal cord, sequela: Secondary | ICD-10-CM

## 2013-08-25 NOTE — Patient Instructions (Addendum)
Recommend standing frame 15 minutes per day workup gradually by 5 minutes per week to a goal of 60 minutes per day   Call Dr. Dwain Sarna for a followup appointment  Continue to followup with neurologist.  Gait strap for driving at [facility] medical supply

## 2013-08-25 NOTE — Progress Notes (Addendum)
Subjective:    Patient ID: [name], male    DOB: [birth year], 42 y.o.   MRN: [id number]  HPI No further seizures.  Followed up with neurology Some bright red blood on glove after rectal stim, hx perirectal abscess, last visit with surgery March 2014. 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 General activity 0 Relation with others 0 Enjoyment of life 0 What TIME of day is your pain at its worst? no pain Sleep (in general) Good  Pain is worse with: no pain Pain improves with: no pain Relief from Meds: no pain  Mobility do you drive?  yes use a wheelchair  Function disabled: date disabled .  Neuro/Psych No problems in this area  Prior Studies Any changes since last visit?  no  Physicians involved in your care Any changes since last visit?  no   Family History  Problem Relation Age of Onset  . Hypertension Mother    History   Social History  . Marital Status: Single    Spouse Name: N/A    Number of Children: N/A  . Years of Education: N/A   Occupational History  . Disable    Social History Main Topics  . Smoking status: Current Every Day Smoker -- 1.00 packs/day for 21 years    Types: Cigarettes  . Smokeless tobacco: Never Used  . Alcohol Use: No  . Drug Use: No  . Sexual Activity: None   Other Topics Concern  . None   Social History Narrative  . None   Past Surgical History  Procedure Laterality Date  . Spine surgery    . Bladder surgery    . Incision and drainage perirectal abscess     Past Medical History  Diagnosis Date  . Tobacco use 03/20/2012  . C5 spinal cord injury   . Seizures     focal  . Quadriplegia and quadriparesis 08/12/2013  . Other forms of epilepsy and  recurrent seizures without mention of intractable epilepsy 08/12/2013   BP 88/41  Pulse 89  Resp 14  Ht 6\' 1"  (1.854 m)  SpO2 99%    Review of Systems  All other systems reviewed and are negative.       Objective:   Physical Exam  Constitutional: He is oriented to person, place, and time. He appears well-developed and well-nourished.  HENT:  Head: Normocephalic and atraumatic.  Musculoskeletal:  Right ankle: He exhibits decreased range of motion.  Left ankle: He exhibits decreased range of motion.  Neurological: He is alert and oriented to person, place, and time. He displays atrophy and abnormal reflex. No sensory deficit. He exhibits abnormal muscle tone. Gait abnormal.  Reflex Scores:  Patellar reflexes are 4+ on the right side and 4+ on the left side.  Achilles reflexes are 4+ on the right side and 4+ on the left side. Clonus at bilat knee ext and ankle DF  Motor strength is 4/5 bilateral deltoid, biceps, triceps wrist extensors 2 minus in the left hand intrinsics and 1/5 in the right hand intrinsics 0/5 Bilateral HF,KE, ADF      Assessment & Plan:  1. 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 Stable deficits.  He has spasticity but does not wish to have any type spasticity  medication.  May stretch  May benefit from a Velcro strap to restrain legs during driving Recommend standing frame 15 minutes per day workup gradually by 5 minutes per week to a goal of 60 minutes per day  2. Neurogenic bowel and bladder. I've asked him to followup with his urologist. Maryclare Labrador need yearly renal ultrasounds. Continue intermittent catheterization  at bedtime every night. Also uses condom catheter  3. History of  Perirectal abscess some bleeding noted instructed to followup with general surgery 4. Seizure disorder on Depakote followup with neurology              RTC 6 months

## 2013-09-03 ENCOUNTER — Telehealth (INDEPENDENT_AMBULATORY_CARE_PROVIDER_SITE_OTHER): Payer: Self-pay

## 2013-09-03 NOTE — Telephone Encounter (Signed)
Pt called stating he is still having some drainage from previous wd that was drained by Dr Dwain Sarna. Pt states he also has area on other buttock that has been there about 6 weeks. No fever. No swelling. Pt states it is a round open area that has very little drainage. Pt states he has had pressure sores before. Pt offered appt with urgent office today. Pt states he does not want to see urgent office MD. Pt request to wait to see Dr Dwain Sarna. Pt advised importance of using soft  seat cushion made for pressure relief to help prevent and improve pressure sores/skin breakdown. Pt also advised of importance of having these areas checked to avoid infection. Pt states he understands and still would like to wait for appt with Dr Dwain Sarna. Appt made with Dr Dwain Sarna and msg sent to his assistant to try to move appt date up if possible.

## 2013-09-04 NOTE — Telephone Encounter (Signed)
Pt Called back. He states area is same as the one Dr Dwain Sarna did surgery on. Would like Alisha to call him back regarding an appointment.

## 2013-09-04 NOTE — Telephone Encounter (Signed)
LMOM stating that I did show the message to Dr Dwain Sarna about this pt scheduling an appt with Dr Dwain Sarna for pressure sores. I advised pt that he really needs to be seen by PCP to follow the pressure sores b/c we are not really going to treat the pressure sores. I advised pt that we would be glad to see him but his PCP needs to be involved also.

## 2013-09-25 ENCOUNTER — Ambulatory Visit: Payer: Medicare Other | Admitting: Physical Medicine & Rehabilitation

## 2013-09-28 ENCOUNTER — Encounter (INDEPENDENT_AMBULATORY_CARE_PROVIDER_SITE_OTHER): Payer: Self-pay | Admitting: General Surgery

## 2013-09-28 ENCOUNTER — Ambulatory Visit (INDEPENDENT_AMBULATORY_CARE_PROVIDER_SITE_OTHER): Payer: Medicare Other | Admitting: General Surgery

## 2013-09-28 ENCOUNTER — Encounter (INDEPENDENT_AMBULATORY_CARE_PROVIDER_SITE_OTHER): Payer: Self-pay

## 2013-09-28 VITALS — BP 124/60 | HR 69 | Temp 98.0°F | Resp 18 | Ht 74.0 in

## 2013-09-28 DIAGNOSIS — K644 Residual hemorrhoidal skin tags: Secondary | ICD-10-CM

## 2013-09-28 NOTE — Progress Notes (Signed)
Subjective:     Patient ID: [name], male   DOB: [birth year], 42 y.o.   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 He had an elevated white blood cell count as well as he was septic at that time. I took him to the operating room and did an incision and drainage as well as a debridement of this entire area. He is eating and having bowel movements fairly regularly. His bowel movements are assisted due to his paraplegia. This area had healed last time we saw each other.  He noted some blood on his finger when manually stimulating himself for a bm and was concerned about an area that he thought might be a boil while viewing it via mirror.  He denies any other real drainage, fevers.     Review of Systems     Objective:   Physical Exam To left of anus while prone there is healed wound without infection There is in right lateral position an external hemorrhoid that is not inflamed that is area he was concerned about, this is soft    Assessment:     S/p fourniers gangrene History cervical spine injury External hemorrhoid     Plan:     He is healed from prior surgery.I think this is just an external skin tag that is causing him a few symptoms right now. He may also just note some blood just from his digital stimulation. There was no other abnormality I could really identify. We discussed continuing his bowel regimen insuring these are soft. He is going to come back and see me as needed.

## 2013-12-21 ENCOUNTER — Telehealth: Payer: Self-pay | Admitting: Neurology

## 2013-12-21 NOTE — Telephone Encounter (Signed)
Pt called.  He stated that [facility]  It stated that he would need an exam by Dr. Vickey Hugerohmeier by March 10th in order to keep his driving privileges.  He stated he could come in at any time.  Please call to discuss if it is possible for him to be seen before the March 10th date.  The best number to reach him is [phone].  Thank you.

## 2013-12-22 NOTE — Telephone Encounter (Signed)
Called patient to inform him that he needed to come in to fill out a release form, before we could fax [facility]. Patient came and filled out the release form and I informed the patient that medical records would fax that information [facility] today. I advised the patient that if he has any other problems, questions or concerns to call the office. Patient verbalized understanding.

## 2014-02-19 ENCOUNTER — Ambulatory Visit (HOSPITAL_BASED_OUTPATIENT_CLINIC_OR_DEPARTMENT_OTHER): Payer: Medicare Other | Admitting: Physical Medicine & Rehabilitation

## 2014-02-19 ENCOUNTER — Encounter: Payer: Self-pay | Admitting: Physical Medicine & Rehabilitation

## 2014-02-19 ENCOUNTER — Encounter: Payer: Worker's Compensation | Attending: Physical Medicine & Rehabilitation

## 2014-02-19 VITALS — BP 99/51 | HR 75 | Resp 14 | Ht 73.0 in

## 2014-02-19 DIAGNOSIS — F17200 Nicotine dependence, unspecified, uncomplicated: Secondary | ICD-10-CM | POA: Insufficient documentation

## 2014-02-19 DIAGNOSIS — G8250 Quadriplegia, unspecified: Secondary | ICD-10-CM | POA: Insufficient documentation

## 2014-02-19 DIAGNOSIS — N319 Neuromuscular dysfunction of bladder, unspecified: Secondary | ICD-10-CM | POA: Insufficient documentation

## 2014-02-19 DIAGNOSIS — S14101A Unspecified injury at C1 level of cervical spinal cord, initial encounter: Secondary | ICD-10-CM

## 2014-02-19 DIAGNOSIS — S14109A Unspecified injury at unspecified level of cervical spinal cord, initial encounter: Secondary | ICD-10-CM

## 2014-02-19 DIAGNOSIS — K592 Neurogenic bowel, not elsewhere classified: Secondary | ICD-10-CM

## 2014-02-19 DIAGNOSIS — G40909 Epilepsy, unspecified, not intractable, without status epilepticus: Secondary | ICD-10-CM | POA: Insufficient documentation

## 2014-02-19 NOTE — Progress Notes (Signed)
Subjective:    Patient ID: [name], male    DOB: [birth year], 43 y.o.   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 General activity 10 Relation with others 10 Enjoyment of life 10 What TIME of day is your pain at its worst? no pain Sleep (in general) Good  Pain is worse with: no pain Pain improves with: no pain Relief from Meds: no pain  Mobility use a wheelchair transfers alone  Function disabled: date disabled [id number]  Neuro/Psych No problems in this area  Prior Studies Any changes since last visit?  no  Physicians involved in your care Any changes since last visit?  no   Family History  Problem Relation Age of Onset  . Hypertension Mother    History   Social History  . Marital Status: Single    Spouse Name: N/A    Number of Children: N/A  . Years of Education: N/A   Occupational History  . Disable    Social History Main Topics  . Smoking status: Current Every Day Smoker -- 1.00 packs/day for 21 years    Types: Cigarettes  . Smokeless tobacco: Never Used  . Alcohol Use: No  . Drug Use: No  . Sexual Activity: None   Other Topics Concern  . None   Social History Narrative  . None   Past Surgical History  Procedure Laterality Date  . Spine surgery    . Bladder surgery    . Incision and drainage perirectal abscess     Past Medical History  Diagnosis Date  . Tobacco use 03/20/2012  . C5 spinal cord injury   . Seizures     focal  . Quadriplegia and quadriparesis 08/12/2013  . 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 08/12/2013   BP 99/51  Pulse 75  Resp 14  Ht 6\' 1"  (1.854 m)  SpO2 98%  Opioid Risk Score:   Fall Risk Score: Low Fall  Risk (0-5 points) Review of Systems  All other systems reviewed and are negative.      Objective:   Physical Exam   Musculoskeletal:  Right ankle: He exhibits decreased range of motion.  Left ankle: He exhibits decreased range of motion.  Neurological: He is alert and oriented to person, place, and time. He displays atrophy and abnormal reflex. No sensory deficit. He exhibits abnormal muscle tone. Gait abnormal.  Reflex Scores:  Patellar reflexes are 4+ on the right side and 4+ on the left side.  Achilles reflexes are 4+ on the right side and 4+ on the left side. Clonus at bilat knee ext and ankle DF  Motor strength is 4/5 bilateral deltoid, biceps, triceps wrist extensors 2 minus in the left hand intrinsics and 1/5 in the right hand intrinsics  0/5 Bilateral HF,KE, ADF      Assessment & Plan:  1. 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 Stable deficits. He has spasticity but does not wish to have any type spasticity medication.  Discussed peer to peer counseling Recommend standing frame 15 minutes per day workup gradually by 5 minutes per week to a goal of 60 minutes per day  2. Neurogenic bowel and bladder. I've asked him to followup with his urologist. Maryclare LabradorWe'll  need yearly renal ultrasounds. Continue intermittent catheterization at bedtime every night. Also uses condom catheter  3. History of  Perirectal abscess some bleeding noted Gen surg note states abscess healed , just has small ext hemmorhoid 4. Seizure disorder on Depakote followup with Neuro

## 2014-02-19 NOTE — Patient Instructions (Signed)
Amada JupiterLucy Hoyle Social Work [facility]  [phone] She may get you further info on volunteering , peer to peer couseling  QUIT SMOKING!

## 2014-05-26 ENCOUNTER — Telehealth: Payer: Self-pay | Admitting: Neurology

## 2014-05-26 NOTE — Telephone Encounter (Signed)
I called and spoke with the patient.  He said he has 9 tabs left, which will be enough to last today and 4 additional days.  States he is taking meds as prescribed, and feels perhaps the pharmacy miscounted his tablets.  They will not refill his meds until the 13th.

## 2014-05-26 NOTE — Telephone Encounter (Signed)
Called pt to inform him per Jessica,CPhT that his samples were ready to be picked up at the front desk and if he has any other problems, questions or concerns to call the office. Pt verbalized understanding.

## 2014-05-26 NOTE — Telephone Encounter (Signed)
Patient calling to state that he only has 6 pills of Depakote left, states that his medication assistance program says he can get a refill on 8/13 so he's requesting if he can get samples until then. Please return call and advise.

## 2014-07-23 ENCOUNTER — Ambulatory Visit: Payer: Self-pay | Admitting: Physical Medicine & Rehabilitation

## 2014-07-26 ENCOUNTER — Encounter: Payer: Self-pay | Admitting: Physical Medicine & Rehabilitation

## 2014-07-26 ENCOUNTER — Encounter: Payer: Worker's Compensation | Attending: Physical Medicine & Rehabilitation

## 2014-07-26 ENCOUNTER — Ambulatory Visit (HOSPITAL_BASED_OUTPATIENT_CLINIC_OR_DEPARTMENT_OTHER): Payer: Medicare Other | Admitting: Physical Medicine & Rehabilitation

## 2014-07-26 VITALS — BP 134/86 | HR 77 | Resp 14 | Ht 74.0 in

## 2014-07-26 DIAGNOSIS — K592 Neurogenic bowel, not elsewhere classified: Secondary | ICD-10-CM | POA: Diagnosis not present

## 2014-07-26 DIAGNOSIS — N319 Neuromuscular dysfunction of bladder, unspecified: Secondary | ICD-10-CM | POA: Insufficient documentation

## 2014-07-26 DIAGNOSIS — G8250 Quadriplegia, unspecified: Secondary | ICD-10-CM

## 2014-07-26 NOTE — Progress Notes (Signed)
Subjective:    Patient ID: [name], male    DOB: [birth year], 43 y.o.   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 General activity 2 Relation with others 2 Enjoyment of life 2 What TIME of day is your pain at its worst? N/A Sleep (in general) Good  Pain is worse with: none Pain improves with: therapy/exercise and keeps active Relief from Meds: 10  Mobility use a wheelchair  Function disabled: date disabled 09/1998  Neuro/Psych bladder control problems weakness  Prior Studies Any changes since last visit?  no  Physicians involved in your care Any changes since last visit?  no   Family History  Problem Relation Age of Onset  . Hypertension Mother    History   Social History  . Marital Status: Single    Spouse Name: N/A    Number of Children: N/A  . Years of Education: N/A   Occupational History  . Disable    Social History Main Topics  . Smoking status: Current Every Day Smoker -- 1.00 packs/day for 21 years    Types: Cigarettes  . Smokeless tobacco: Never Used  . Alcohol Use: No  . Drug Use: No  . Sexual Activity: None   Other Topics Concern  . None   Social History Narrative  . None   Past Surgical History  Procedure Laterality Date  . Spine surgery    . Bladder surgery    . Incision and drainage perirectal abscess     Past Medical History  Diagnosis Date  . Tobacco use 03/20/2012  . C5 spinal cord injury   . Seizures     focal  . Quadriplegia and quadriparesis 08/12/2013  . 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  08/12/2013   BP 134/86  Pulse 77  Resp 14  Ht 6\' 2"  (1.88 m)  SpO2 100%  Opioid Risk Score:   Fall Risk Score: Low Fall Risk (0-5 points)   Review of Systems     Objective:   Physical Exam  Right ankle: He exhibits decreased range of motion.  Left ankle: He exhibits decreased range of motion.  Neurological: He is alert and oriented to person, place, and time. He displays atrophy and abnormal reflex. No sensory deficit. He exhibits abnormal muscle tone. Gait abnormal.  Reflex Scores:  Patellar reflexes are 4+ on the right side and 4+ on the left side.  Achilles reflexes are 4+ on the right side and 4+ on the left side. Clonus at bilat knee ext and ankle DF  Motor strength is 4/5 bilateral deltoid, biceps, triceps wrist extensors 2 minus in the left hand intrinsics and 1/5 in the right hand intrinsics  0/5 Bilateral HF,KE, ADF     Assessment & Plan:  1. 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 Stable deficits. He has spasticity but does not wish to have any type spasticity medication. No worsening  Recommend standing frame 15 minutes per day workup gradually by 5 minutes per week to a goal of 60 minutes per day , we once again discussed the benefit of this treatment  on bone density 2. Neurogenic bowel and bladder. I've asked him to followup with his urologist. Maryclare Labrador need yearly renal ultrasounds. Continue intermittent catheterization at bedtime every night. Also uses condom catheter  3. History of  Perirectal abscess Dr Dwain Sarna at [facility] has treated       4. Seizure disorder on Depakote followup with Neuro, Dr Vickey Huger, 08/12/2014

## 2014-07-26 NOTE — Patient Instructions (Signed)
Call volunteer services at [facility] Mr Darrin NipperBob Besse  [phone] to see about peer counseling for spinal cord injury patients

## 2014-08-04 ENCOUNTER — Telehealth: Payer: Self-pay | Admitting: *Deleted

## 2014-08-04 NOTE — Telephone Encounter (Signed)
Form,DMV parking Placard to [facility] 08-03-14.

## 2014-08-04 NOTE — Telephone Encounter (Signed)
I received Forms from Medical Records ([employer] Application for Disability Parking) and I have placed them in the Providers file and have placed them on his assistants In-Box to be completed.  The patient states that his previous one expired in February 2015 and he received a $250 ticket for parking in Handicap parking.

## 2014-08-05 NOTE — Telephone Encounter (Signed)
Patient needs continuous  handicap parking.

## 2014-08-10 NOTE — Telephone Encounter (Signed)
I spoke to pt and he will get this on 08-12-14 when appt with Enid Skeens martin, NP at 1330.  Form given to [employer].

## 2014-08-12 ENCOUNTER — Ambulatory Visit (INDEPENDENT_AMBULATORY_CARE_PROVIDER_SITE_OTHER): Payer: Medicare Other | Admitting: Nurse Practitioner

## 2014-08-12 ENCOUNTER — Encounter: Payer: Self-pay | Admitting: Nurse Practitioner

## 2014-08-12 VITALS — BP 105/75 | HR 84

## 2014-08-12 DIAGNOSIS — R569 Unspecified convulsions: Secondary | ICD-10-CM

## 2014-08-12 DIAGNOSIS — S14109S Unspecified injury at unspecified level of cervical spinal cord, sequela: Secondary | ICD-10-CM

## 2014-08-12 DIAGNOSIS — R561 Post traumatic seizures: Secondary | ICD-10-CM

## 2014-08-12 DIAGNOSIS — Z5181 Encounter for therapeutic drug level monitoring: Secondary | ICD-10-CM

## 2014-08-12 DIAGNOSIS — G8250 Quadriplegia, unspecified: Secondary | ICD-10-CM

## 2014-08-12 MED ORDER — DIVALPROEX SODIUM ER 500 MG PO TB24: 500.0000 mg | ORAL_TABLET | Freq: Two times a day (BID) | ORAL | Status: DC

## 2014-08-12 NOTE — Patient Instructions (Signed)
Continue Depakote at current dose Check labs today F/U yearly Handicap sticker signed

## 2014-08-12 NOTE — Progress Notes (Signed)
I agree with the assessment and plan as directed by NP .The patient is known to me .   Wynell Halberg, MD  

## 2014-08-12 NOTE — Progress Notes (Signed)
[facility]  PATIENT: [name] DOB: 1971/08/13   REASON FOR VISIT: Followup for seizure disorder   HISTORY OF PRESENT ILLNESS:[name], 43 yr old returns for f/u. He was last seen 05/23/12. [facility]  [facility] for complex partial seizures, consisting of "lonely feelings" followed by visual hallucinations and altered mental status. He has been on Depakote with excellent control of these spells, and the drug has likely helped his moods as well. 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  He returns today doing well.  He continues to take Depakote without difficulty. He needs labs and renewal on his medication.  REVIEW OF SYSTEMS: Full 14 system review of systems performed and notable only for those listed, all others are neg:  Constitutional: N/A  Cardiovascular: N/A  Ear/Nose/Throat: N/A  Skin: N/A  Eyes: N/A  Respiratory: N/A  Gastroitestinal: N/A  Hematology/Lymphatic: N/A  Endocrine: N/A Musculoskeletal:N/A  Allergy/Immunology: N/A  Neurological: N/A Psychiatric: N/A Sleep : NA   ALLERGIES: No Known Allergies  HOME MEDICATIONS: Outpatient Prescriptions Prior to Visit  Medication Sig Dispense Refill  . divalproex (DEPAKOTE ER) 500 MG 24 hr tablet Take 1 tablet (500 mg total) by mouth 2 (two) times daily. Take 1 tablet at 8 am and 8 pm daily  180 tablet  3   No facility-administered medications prior to visit.    PAST MEDICAL HISTORY: Past Medical History  Diagnosis Date  . Tobacco use 03/20/2012  . C5 spinal cord injury   . Seizures     focal  . Quadriplegia and quadriparesis 08/12/2013  . 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  Spine surgery    . Bladder surgery    . Incision and drainage perirectal abscess      FAMILY HISTORY: Family History  Problem Relation Age of Onset  . Hypertension Mother     SOCIAL HISTORY: History   Social History  . Marital Status: Single    Spouse Name: N/A    Number of Children: N/A  . Years of Education: N/A   Occupational History  . Disable    Social History Main Topics  . Smoking status: Current Every Day Smoker -- 1.00 packs/day for 21 years    Types: Cigarettes  . Smokeless tobacco: Never Used  . Alcohol Use: No  . Drug Use: No  . Sexual Activity: Not on file   Other Topics Concern  . Not on file   Social History Narrative  . No narrative on file     PHYSICAL EXAM  Filed Vitals:   08/12/14 1332  BP: 105/75  Pulse: 84   Cannot calculate BMI with a height equal to zero. Mental Status: Awake, alert, in no distress.  Speech fluent and not dysarthic. Cranial Nerves: Extraocular movements full without nystagmus.  Face, tongue, palate move normally and symmetrically. Motor: Normal strength on limited testing to C7. Moderate weakness finger flexion. No strength hand intrinsics. LEs plegic, extensor spasms. Coordination: Rapid movements intact in upper extremities.  Finger-to-nose normal. Gait and Station: Wheelchair bound.  DIAGNOSTIC DATA (LABS, IMAGING, TESTING) - I reviewed patient records, labs, notes, testing and imaging myself where available.  Lab Results  Component Value Date  WBC 7.1 06/07/2013   HGB 16.8 06/07/2013   HCT 47.3 06/07/2013   MCV 89.4 06/07/2013   PLT 185 06/07/2013      Component Value Date/Time   NA 136 06/07/2013 1901   K 4.4 06/07/2013 1901   CL 101 06/07/2013 1901   CO2 26 06/07/2013 1901   GLUCOSE 89 06/07/2013 1901   BUN 17 06/07/2013 1901   CREATININE 0.90 06/07/2013 1901   CALCIUM 9.2 06/07/2013 1901   PROT 6.7 02/06/2013 0900   ALBUMIN 2.7* 02/06/2013 0900   AST 12 02/06/2013 0900   ALT 8 02/06/2013 0900   ALKPHOS 73  02/06/2013 0900   BILITOT 0.7 02/06/2013 0900   GFRNONAA >90 06/07/2013 1901   GFRAA >90 06/07/2013 1901       ASSESSMENT AND PLAN  43 y.o. year old male  has a past medical history of  Seizures; Quadriplegia and quadriparesis (08/12/2013);  here to followup. No seizure activity since last seen in 2013.  Continue Depakote at current dose Check labs today, CBC, CMP VPA level F/U yearly, next with dr. Vickey Hugerohmeier Handicap sticker signed Nilda RiggsNancy Carolyn Dorsie Burich, [facility]NP, [facility], APRN  [facility] [address], [address] [place], [place] [address] [phone]

## 2014-08-13 LAB — COMPREHENSIVE METABOLIC PANEL
ALT: 13 IU/L (ref 0–44)
AST: 16 IU/L (ref 0–40)
Albumin/Globulin Ratio: 1.2 (ref 1.1–2.5)
Albumin: 3.8 g/dL (ref 3.5–5.5)
Alkaline Phosphatase: 83 IU/L (ref 39–117)
BUN/Creatinine Ratio: 16 (ref 9–20)
BUN: 14 mg/dL (ref 6–24)
CO2: 25 mmol/L (ref 18–29)
Calcium: 9.4 mg/dL (ref 8.7–10.2)
Chloride: 102 mmol/L (ref 96–108)
Creatinine, Ser: 0.85 mg/dL (ref 0.76–1.27)
GFR calc Af Amer: 124 mL/min/{1.73_m2} (ref >59)
GFR calc non Af Amer: 107 mL/min/{1.73_m2} (ref >59)
Globulin, Total: 3.1 g/dL (ref 1.5–4.5)
Glucose: 79 mg/dL (ref 65–99)
Potassium: 4.3 mmol/L (ref 3.5–5.2)
Sodium: 136 mmol/L (ref 134–144)
Total Bilirubin: 0.4 mg/dL (ref 0.0–1.2)
Total Protein: 6.9 g/dL (ref 6.0–8.5)

## 2014-08-13 LAB — CBC WITH DIFFERENTIAL/PLATELET
Basophils Absolute: 0 10*3/uL (ref 0.0–0.2)
Basos: 1 %
Eos: 1 %
Eosinophils Absolute: 0.1 10*3/uL (ref 0.0–0.4)
HCT: 44.3 % (ref 37.5–51.0)
Hemoglobin: 16.2 g/dL (ref 12.6–17.7)
Lymphocytes Absolute: 3 10*3/uL (ref 0.7–3.1)
Lymphs: 39 %
MCH: 31.8 pg (ref 26.6–33.0)
MCHC: 36.6 g/dL — ABNORMAL HIGH (ref 31.5–35.7)
MCV: 87 fL (ref 79–97)
Monocytes Absolute: 0.6 10*3/uL (ref 0.1–0.9)
Monocytes: 8 %
Neutrophils Absolute: 4 10*3/uL (ref 1.4–7.0)
Neutrophils Relative %: 51 %
RBC: 5.1 x10E6/uL (ref 4.14–5.80)
RDW: 14 % (ref 12.3–15.4)
WBC: 7.6 10*3/uL (ref 3.4–10.8)

## 2014-08-13 LAB — VALPROIC ACID LEVEL: Valproic Acid Lvl: 68 ug/mL (ref 50–100)

## 2014-08-16 ENCOUNTER — Encounter: Payer: Self-pay | Admitting: Nurse Practitioner

## 2014-08-27 ENCOUNTER — Telehealth: Payer: Self-pay | Admitting: Nurse Practitioner

## 2014-08-27 NOTE — Telephone Encounter (Signed)
Patient completely out of Depakote, requesting Rx refill  divalproex (DEPAKOTE ER) 500 MG 24 hr tablet.  Questioning if he could get samples until he get its feel.

## 2014-08-27 NOTE — Telephone Encounter (Signed)
I called back.  Patient asked me when [facility].  I told him unfortunately I do not know that info.  Recommended he call them and inquire about meds.  Says he called them and left a message.  Indicates he changed his phone number about two months ago, so maybe they did not have his updated info.  Feels as though they will fill meds next week.  Says the line where he gets meds was too long when he went.  Unfortunately, we do not have samples of this medication.  I offered to call in a small supply of meds to local pharmacy, and explained he would have to pay for the Rx.  Patient declined.  Told him if he changes his mind he can call us back and we will be happy to call in Rx.

## 2014-08-27 NOTE — Telephone Encounter (Signed)
Patient returning call to Jessica, please call and advise.  °

## 2014-08-27 NOTE — Telephone Encounter (Signed)
A one year Rx was sent on 10/22.  I called the patient back.  Got no answer.  Left message.

## 2014-11-30 ENCOUNTER — Telehealth: Payer: Self-pay | Admitting: *Deleted

## 2014-11-30 NOTE — Telephone Encounter (Signed)
Spoke to patient. Advised not showing any type of documentation from [facility] stating he was unable to drive. Went over last office visit note. Patient stated he would contact DMV and follow up. Patient reported he is doing fine also.

## 2014-12-14 ENCOUNTER — Telehealth: Payer: Self-pay | Admitting: *Deleted

## 2014-12-14 NOTE — Telephone Encounter (Signed)
I think I just filled out form, check my stack

## 2014-12-14 NOTE — Telephone Encounter (Signed)
[employer] called said they faxed over an order for catheter supplies for pt. They are asking what the status is on the request and if it is completed could we please fax it back to them. Fax: [phone]

## 2014-12-15 ENCOUNTER — Telehealth: Payer: Self-pay | Admitting: *Deleted

## 2014-12-15 NOTE — Telephone Encounter (Signed)
Patient calling stating that he will be bringing in a DMV from to be filled out. He states that he has not had a seizure in a couple of years which that is what the last note says but his DMV letter states that he should not be driving. Patient wants to know do he need another appt set up for this to be done. Please call patient if needs another follow up or not.

## 2014-12-16 NOTE — Telephone Encounter (Signed)
Called patient and gave instructions per CM previous note. Patient verbalized understanding.

## 2014-12-16 NOTE — Telephone Encounter (Signed)
He can bring the form in to fill out but I would first ask him to call DMV to see if there is a mix up.

## 2014-12-21 ENCOUNTER — Telehealth: Payer: Self-pay | Admitting: *Deleted

## 2014-12-21 NOTE — Telephone Encounter (Signed)
Form,DMV [facility] and Eber JonesCarolyn 12-20-13.

## 2014-12-22 DIAGNOSIS — Z0289 Encounter for other administrative examinations: Secondary | ICD-10-CM

## 2014-12-27 NOTE — Telephone Encounter (Addendum)
Patient said his name should be spelled the way it is in our system. The DMV has it spelled incorrectly. Requesting paperwork to FAX#[phone]. Gave form to medical records.

## 2014-12-28 ENCOUNTER — Telehealth: Payer: Self-pay | Admitting: *Deleted

## 2014-12-28 NOTE — Telephone Encounter (Signed)
Calling about the request submitted [facility] supplies [facility].  They have requested notes from 07/26/14 visit supporting the order for the supplies.  Please fax them to [phone]

## 2014-12-28 NOTE — Telephone Encounter (Signed)
Form,DMV received,completed by Rosalie Gumsarolyn and Cassandra faxed 12-28-14.

## 2015-01-24 ENCOUNTER — Telehealth: Payer: Self-pay | Admitting: Neurology

## 2015-01-24 NOTE — Telephone Encounter (Signed)
I called back.  Spoke with Rosey Batheresa who was not able to assist me, and transferred me to a voicemail.  Left message advising we only show 1 Rx for this med, and Rx should be filled whenever it is due.  Asked that they call back if anything further is needed.

## 2015-01-24 NOTE — Telephone Encounter (Signed)
[name] with [facility] [phone] is calling as patient is again requesting Rx Depakote to be refilled early again. Refill number this time [phone] but the [facility] numbers begin with 6. [name] is wondering if he has another Rx for this drug.  Should this pharmacy go ahead and refill?  Please call.

## 2015-01-25 ENCOUNTER — Ambulatory Visit (HOSPITAL_BASED_OUTPATIENT_CLINIC_OR_DEPARTMENT_OTHER): Payer: Medicare Other | Admitting: Physical Medicine & Rehabilitation

## 2015-01-25 ENCOUNTER — Encounter: Payer: Medicare Other | Attending: Physical Medicine & Rehabilitation

## 2015-01-25 ENCOUNTER — Ambulatory Visit: Payer: Self-pay

## 2015-01-25 ENCOUNTER — Encounter: Payer: Self-pay | Admitting: Physical Medicine & Rehabilitation

## 2015-01-25 VITALS — BP 116/72 | HR 102 | Resp 14

## 2015-01-25 DIAGNOSIS — G8250 Quadriplegia, unspecified: Secondary | ICD-10-CM | POA: Insufficient documentation

## 2015-01-25 DIAGNOSIS — N319 Neuromuscular dysfunction of bladder, unspecified: Secondary | ICD-10-CM | POA: Insufficient documentation

## 2015-01-25 DIAGNOSIS — K592 Neurogenic bowel, not elsewhere classified: Secondary | ICD-10-CM | POA: Insufficient documentation

## 2015-01-25 DIAGNOSIS — S14109S Unspecified injury at unspecified level of cervical spinal cord, sequela: Secondary | ICD-10-CM | POA: Insufficient documentation

## 2015-01-25 NOTE — Progress Notes (Signed)
Subjective:    Patient ID: [name], male    DOB: 04/13/1971, 44 y.o.   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 Seen by Urology finished antibiotics Still sees general surgery Pain Inventory Average Pain 0 Pain Right Now 0 My pain is NO PAIN  In the last 24 hours, has pain interfered with the following? General activity 10 Relation with others 10 Enjoyment of life 10 What TIME of day is your pain at its worst? morning Sleep (in general) Good  Pain is worse with: some activites Pain improves with: rest Relief from Meds: 5  Mobility ability to climb steps?  no do you drive?  yes use a wheelchair  Function disabled  Neuro/Psych No problems in this area  Prior Studies Any changes since last visit?  no  Physicians involved in your care Any changes since last visit?  no   Family History  Problem Relation Age of Onset  . Hypertension Mother    History   Social History  . Marital Status: Single    Spouse Name: N/A  . Number of Children: N/A  . Years of Education: N/A   Occupational History  . Disable    Social History Main Topics  . Smoking status: Current Every Day Smoker -- 1.00 packs/day for 21 years    Types: Cigarettes  . Smokeless tobacco: Never Used  . Alcohol Use: No  . Drug Use: No  . Sexual Activity: Not on file   Other Topics Concern  . None   Social History Narrative   Past Surgical History  Procedure Laterality Date  . Spine surgery    . Bladder surgery    . Incision and drainage perirectal abscess     Past Medical History  Diagnosis Date  . Tobacco use 03/20/2012  . C5 spinal cord injury   . Seizures     focal  . Quadriplegia and quadriparesis 08/12/2013  . 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 08/12/2013   BP 116/72 mmHg  Pulse 102  Resp 14  SpO2 99%  Opioid Risk Score:   Fall Risk Score: Low Fall Risk (0-5  points)`1  Depression screen PHQ 2/9  Depression screen PHQ 2/9 01/25/2015  Decreased Interest 3  Down, Depressed, Hopeless 1  PHQ - 2 Score 4  Altered sleeping 2  Tired, decreased energy 0  Change in appetite 0  Feeling bad or failure about yourself  0  Trouble concentrating 0  Moving slowly or fidgety/restless 1  Suicidal thoughts 0  PHQ-9 Score 7     Review of Systems  Constitutional: Negative.   HENT: Negative.   Eyes: Negative.   Respiratory: Negative.   Cardiovascular: Negative.   Gastrointestinal: Negative.   Endocrine: Negative.   Genitourinary: Negative.   Musculoskeletal: Negative.   Allergic/Immunologic: Negative.   Neurological: Negative.   Hematological: Negative.   Psychiatric/Behavioral: Negative.        Objective:   Physical Exam 5/5 Bilateral delt 5/5 Bilat Wrist ext 3/5 R finger flexor, 4/5 Left finger flexor 4/5 bilateral triceps 0/5 strength in bilateral hip flexors knee extensors ankle dorsi flexor plantar flexor Severe spasticity bilateral lower limbs, Sustaining clonus at the knees and ankles bilaterally also has extensor spasms with passive knee range of motion  Gen. No acute distress Mood and affect are appropriate     Assessment & Plan:  1. 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  Stable deficits. He has spasticity but does not wish to have any type spasticity medication. No worsening   2. Neurogenic bowel and bladder. I've asked him to followup with his urologist. Maryclare Labrador need yearly renal ultrasounds. Continue intermittent catheterization at 7p  every night. Also uses condom catheter   3. History of   Perirectal abscess Follow-up with Dr Dwain Sarna at [facility]        4. Seizure disorder on Depakote followup with Neuro, Dr Vickey Huger, 08/12/2014

## 2015-01-25 NOTE — Patient Instructions (Addendum)
Water or juice 2 quarts per day Rec stop smoking  Call if increasing weakness in the arms

## 2015-01-26 ENCOUNTER — Telehealth: Payer: Self-pay | Admitting: Nurse Practitioner

## 2015-01-26 NOTE — Telephone Encounter (Signed)
The [facility]  He has refills on file with them, and will need to contact them to get med filled [phone]  Da[facility] says patient continuously tries to get his medication refilled early every single time he needs Rx, and when he last asked them for a refill, it was too soon.  I called the patient back.  Got no answer.  Left message with number for him to call [facility] for Rx.

## 2015-01-26 NOTE — Telephone Encounter (Signed)
[name] is running out of his prescription he only have 2 fills left and needs a refill he only has enough to take it for tonight and tomorrow morning he takes it twice a day. The drug name is Depakote ER 500MG . The best number to contact him is [phone]. Hopefully you can get it in soon. He's stressing about this because he's running out of his medicine.

## 2015-01-27 ENCOUNTER — Telehealth: Payer: Self-pay | Admitting: Nurse Practitioner

## 2015-01-27 DIAGNOSIS — R561 Post traumatic seizures: Secondary | ICD-10-CM

## 2015-01-27 DIAGNOSIS — G8250 Quadriplegia, unspecified: Secondary | ICD-10-CM

## 2015-01-27 MED ORDER — DIVALPROEX SODIUM ER 500 MG PO TB24: 500.0000 mg | ORAL_TABLET | Freq: Two times a day (BID) | ORAL | Status: DC

## 2015-01-27 NOTE — Telephone Encounter (Signed)
[facility]  I called back to advise.  He is aware.

## 2015-01-27 NOTE — Telephone Encounter (Signed)
Pt is calling needing 2 pills of divalproex (DEPAKOTE ER) 500 MG 24 hr tablet to be called into [facility].  He states he is completely out and his meds will be in tomorrow, he just needs pills for tonight.  He would like a Rx for divalproex (DEPAKOTE ER) 500 MG 24 hr tablet just for 2 pills to be faxed.  He states he has to get this from a doctor's office.  Please call and advise pt.

## 2015-02-17 ENCOUNTER — Telehealth: Payer: Self-pay | Admitting: *Deleted

## 2015-02-17 NOTE — Telephone Encounter (Signed)
Urology needs to do this

## 2015-02-17 NOTE — Telephone Encounter (Signed)
[name] called and left us a contact number of [phone] for   "[employer]".  He says they need our office to contact them about  order for them to continue providing him his ky jelly for bowel management.

## 2015-02-21 NOTE — Telephone Encounter (Signed)
I notified Mr [name] to contact his urologist.  The company actually told me they are not sure his insurance will cover this for him, anyway.

## 2015-02-25 DIAGNOSIS — R159 Full incontinence of feces: Secondary | ICD-10-CM | POA: Diagnosis not present

## 2015-07-20 ENCOUNTER — Telehealth: Payer: Self-pay | Admitting: *Deleted

## 2015-07-20 NOTE — Telephone Encounter (Signed)
[name] called asking about the phone call he received.  He thought he was being told he needed to bring specific information from other doctors.  It is a 6 month follow up only. Arrive early to fill out paperwork.

## 2015-07-25 ENCOUNTER — Encounter: Payer: Medicare Other | Attending: Physical Medicine & Rehabilitation

## 2015-07-25 ENCOUNTER — Encounter: Payer: Self-pay | Admitting: Physical Medicine & Rehabilitation

## 2015-07-25 ENCOUNTER — Ambulatory Visit (HOSPITAL_BASED_OUTPATIENT_CLINIC_OR_DEPARTMENT_OTHER): Payer: Medicare Other | Admitting: Physical Medicine & Rehabilitation

## 2015-07-25 VITALS — BP 84/53 | HR 85

## 2015-07-25 DIAGNOSIS — Z720 Tobacco use: Secondary | ICD-10-CM | POA: Diagnosis not present

## 2015-07-25 DIAGNOSIS — S14157S Other incomplete lesion at C7 level of cervical spinal cord, sequela: Secondary | ICD-10-CM | POA: Diagnosis not present

## 2015-07-25 DIAGNOSIS — K592 Neurogenic bowel, not elsewhere classified: Secondary | ICD-10-CM | POA: Diagnosis not present

## 2015-07-25 DIAGNOSIS — L89899 Pressure ulcer of other site, unspecified stage: Secondary | ICD-10-CM | POA: Diagnosis not present

## 2015-07-25 DIAGNOSIS — G40909 Epilepsy, unspecified, not intractable, without status epilepticus: Secondary | ICD-10-CM | POA: Insufficient documentation

## 2015-07-25 DIAGNOSIS — X58XXXS Exposure to other specified factors, sequela: Secondary | ICD-10-CM | POA: Diagnosis not present

## 2015-07-25 DIAGNOSIS — S14109S Unspecified injury at unspecified level of cervical spinal cord, sequela: Secondary | ICD-10-CM

## 2015-07-25 DIAGNOSIS — G8250 Quadriplegia, unspecified: Secondary | ICD-10-CM | POA: Insufficient documentation

## 2015-07-25 DIAGNOSIS — N319 Neuromuscular dysfunction of bladder, unspecified: Secondary | ICD-10-CM | POA: Diagnosis not present

## 2015-07-25 DIAGNOSIS — R252 Cramp and spasm: Secondary | ICD-10-CM | POA: Diagnosis not present

## 2015-07-25 NOTE — Progress Notes (Signed)
Subjective:    Patient ID: [name], male    DOB: September 14, 1971, 44 y.o.   MRN: [id number]  HPI Volunteers at school  Small area of skin breakdown noted left hi[p Does usually sleep on his side, last 2 days sleeping on stomach or back PMH- few years     Pain Inventory Average Pain 8 Pain Right Now 0 My pain is burning  In the last 24 hours, has pain interfered with the following? General activity 10 Relation with others 10 Enjoyment of life no selection What TIME of day is your pain at its worst? 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 Do you have any goals in this area?  yes  Neuro/Psych No problems in this area  Prior Studies Any changes since last visit?  no  Physicians involved in your care Any changes since last visit?  no   Family History  Problem Relation Age of Onset  . Hypertension Mother    Social History   Social History  . Marital Status: Single    Spouse Name: N/A  . Number of Children: N/A  . Years of Education: N/A   Occupational History  . Disable    Social History Main Topics  . Smoking status: Current Every Day Smoker -- 1.00 packs/day for 21 years    Types: Cigarettes  . Smokeless tobacco: Never Used  . Alcohol Use: No  . Drug Use: No  . Sexual Activity: Not Asked   Other Topics Concern  . None   Social History Narrative   Past Surgical History  Procedure Laterality Date  . Spine surgery    . Bladder surgery    . Incision and drainage perirectal abscess     Past Medical History  Diagnosis Date  . Tobacco use 03/20/2012  . C5 spinal cord injury (HCC)   . Seizures (HCC)     focal  . Quadriplegia and quadriparesis (HCC) 08/12/2013  . Other forms of epilepsy and recurrent seizures without mention  of intractable epilepsy 08/12/2013   BP 84/53 mmHg  Pulse 85  SpO2 97%  Opioid Risk Score:   Fall Risk Score:  `1  Depression screen PHQ 2/9  Depression screen [facility] 2/9 07/25/2015 01/25/2015  Decreased Interest 0 3  Down, Depressed, Hopeless 0 1  PHQ - 2 Score 0 4  Altered sleeping - 2  Tired, decreased energy - 0  Change in appetite - 0  Feeling bad or failure about yourself  - 0  Trouble concentrating - 0  Moving slowly or fidgety/restless - 1  Suicidal thoughts - 0  PHQ-9 Score - 7     Review of Systems  All other systems reviewed and are negative.      Objective:   Physical Exam  Constitutional: He is oriented to person, place, and time. He appears well-developed and well-nourished.  HENT:  Head: Normocephalic and atraumatic.  Right Ear: External ear normal.  Left Ear: External ear normal.  Eyes: Conjunctivae and EOM are normal. Pupils are equal, round, and reactive to light.  Neck: Normal range of motion.  Musculoskeletal:       Right hip: He exhibits decreased strength.       Left hip: He exhibits decreased strength.       Right ankle: He  exhibits swelling.       Left ankle: He exhibits swelling.  Neurological: He is alert and oriented to person, place, and time.  Psychiatric: He has a normal mood and affect.  Nursing note and vitals reviewed.   Right hip greater troch area depigmented area 1cm  Grade 2 Left greater troch 2 cm Motor strength is 5/5 bilateral deltoid biceps and wrist extensors 4 minus at the finger flexors 3+ triceps 0 in bilateral lower extremities Sensation intact to light touch in bilateral upper and lower limbs.      Assessment & Plan:  1. 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 Stable deficits. He has spasticity but does not wish to have any type spasticity medication. No worsening Do not think any imaging studies are necessary at the current time.  2. Neurogenic bowel and bladder. I've asked him to followup with his urologist. Maryclare Labrador need yearly  renal ultrasounds. Continue intermittent catheterization at 7p  every night. Also uses condom catheter     3. Seizure disorder on Depakote followup with Neuro, Dr Vickey Huger,  4. Decubitus ulcers left greater trochanter region. Prior problems on the right side with some depigmentation. The left side looks like it has some active skin ulceration. Recommend laying on his back or on his stomach rather than side lying. His favorite position is left side-lying. Will follow up in 1 month if he is not healing will recommend wound center We'll continue do [facility] type dressings that he wears over his bilateral hips

## 2015-07-25 NOTE — Patient Instructions (Signed)
Do not lay on left side  May lay on back or stomach  Will recheck in one month

## 2015-08-15 ENCOUNTER — Ambulatory Visit (INDEPENDENT_AMBULATORY_CARE_PROVIDER_SITE_OTHER): Payer: Medicare Other | Admitting: Nurse Practitioner

## 2015-08-15 ENCOUNTER — Encounter: Payer: Self-pay | Admitting: Nurse Practitioner

## 2015-08-15 VITALS — BP 92/50 | HR 102 | Ht 74.0 in | Wt 155.0 lb

## 2015-08-15 DIAGNOSIS — Z5181 Encounter for therapeutic drug level monitoring: Secondary | ICD-10-CM | POA: Diagnosis not present

## 2015-08-15 DIAGNOSIS — G8250 Quadriplegia, unspecified: Secondary | ICD-10-CM

## 2015-08-15 DIAGNOSIS — R561 Post traumatic seizures: Secondary | ICD-10-CM | POA: Diagnosis not present

## 2015-08-15 DIAGNOSIS — N319 Neuromuscular dysfunction of bladder, unspecified: Secondary | ICD-10-CM | POA: Diagnosis not present

## 2015-08-15 DIAGNOSIS — R569 Unspecified convulsions: Secondary | ICD-10-CM | POA: Diagnosis not present

## 2015-08-15 DIAGNOSIS — G40309 Generalized idiopathic epilepsy and epileptic syndromes, not intractable, without status epilepticus: Secondary | ICD-10-CM

## 2015-08-15 MED ORDER — DIVALPROEX SODIUM ER 500 MG PO TB24: 500.0000 mg | ORAL_TABLET | Freq: Two times a day (BID) | ORAL | Status: DC

## 2015-08-15 NOTE — Patient Instructions (Addendum)
[place] [facility] [place] [facility] Check labs today, CBC, CMP VPA level F/U yearly, next with Dr. Vickey Hugerohmeier

## 2015-08-15 NOTE — Progress Notes (Signed)
[name]  PATIENT: [name] DOB: 03/20/1971   REASON FOR VISIT: follow-up for seizure disorder, quadriplegia,cervical spinal cord injury HISTORY FROM:patient    HISTORY OF PRESENT ILLNESS:Mr. [name], 44 yr old returns for f/u. He was last seen 08/12/14. He has been followed at [name] since 2001 for complex partial seizures, consisting of "lonely feelings" followed by visual hallucinations and altered mental status. He has been on Depakote with excellent control of these spells, and the drug has likely helped his moods as well. 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 He returns today doing well. He continues to take Depakote without difficulty. He needs labs and renewal on his medication. He says he has had problems with decubitus ulcers in the last year. He continues to work 4-5 hours several days a week.   REVIEW OF SYSTEMS: Full 14 system review of systems performed and notable only for those listed, all others are neg:  Constitutional: neg  Cardiovascular: neg Ear/Nose/Throat: neg  Skin: neg Eyes: neg Respiratory: neg Gastroitestinal: neg  Hematology/Lymphatic: neg  Endocrine: neg Musculoskeletal:neg Allergy/Immunology: neg Neurological: neg Psychiatric: neg Sleep : neg   ALLERGIES: No Known Allergies  HOME MEDICATIONS: Outpatient Prescriptions Prior to Visit  Medication Sig Dispense Refill  . divalproex (DEPAKOTE ER) 500 MG 24 hr tablet Take 1 tablet (500 mg total) by mouth 2 (two) times daily. Take 1 tablet at 8 am and 8 pm daily 2 tablet 1   No facility-administered medications prior to visit.    PAST MEDICAL HISTORY: Past Medical History  Diagnosis Date  . Tobacco use 03/20/2012  . C5 spinal cord injury (HCC)   . Seizures (HCC)     focal  . Quadriplegia and quadriparesis (HCC)  08/12/2013  . 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 Spine surgery    . Bladder surgery    . Incision and drainage perirectal abscess      FAMILY HISTORY: Family History  Problem Relation Age of Onset  . Hypertension Mother     SOCIAL HISTORY: Social History   Social History  . Marital Status: Single    Spouse Name: N/A  . Number of Children: N/A  . Years of Education: N/A   Occupational History  . Disable    Social History Main Topics  . Smoking status: Current Every Day Smoker -- 0.08 packs/day for 21 years    Types: Cigarettes  . Smokeless tobacco: Never Used  . Alcohol Use: No  . Drug Use: No  . Sexual Activity: Not on file   Other Topics Concern  . Not on file   Social History Narrative     PHYSICAL EXAM  Filed Vitals:   08/15/15 1312  BP: 92/50  Pulse: 102  Height:  (1.88 m)  Weight: 155 lb (70.308 kg)   Body mass index is 19.89 kg/(m^2). Mental Status: Awake, alert, in no distress. Speech fluent and not dysarthic. Cranial Nerves: Extraocular movements full without nystagmus. Face, tongue, palate move normally and symmetrically. Motor: Normal strength on limited testing to C7. Moderate weakness finger flexion. No strength hand intrinsics. LEs plegic, extensor spasms. Coordination: Rapid movements intact in upper extremities. Finger-to-nose normal. Gait and Station: Wheelchair bound. DIAGNOSTIC DATA (LABS, IMAGING, TESTING) -  ASSESSMENT AND PLAN  44 y.o. 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 here to follow-up. One seizure in the last year after missing a dose of medication.  Continue Depakote  at current dose [name] Check labs today, CBC, CMP VPA level F/U yearly, next with Dr. [name], [name], [facility], APRN  [name] [address], [address] [place], [place] [id number] [phone]

## 2015-08-15 NOTE — Progress Notes (Signed)
I agree with the assessment and plan as directed by NP .The patient is known to me .   Terald Jump, MD  

## 2015-08-16 ENCOUNTER — Telehealth: Payer: Self-pay | Admitting: *Deleted

## 2015-08-16 LAB — CBC WITH DIFFERENTIAL/PLATELET
Basophils Absolute: 0 10*3/uL (ref 0.0–0.2)
Basos: 0 %
EOS (ABSOLUTE): 0.2 10*3/uL (ref 0.0–0.4)
Eos: 3 %
Hematocrit: 47.8 % (ref 37.5–51.0)
Hemoglobin: 16.7 g/dL (ref 12.6–17.7)
Immature Grans (Abs): 0 10*3/uL (ref 0.0–0.1)
Immature Granulocytes: 0 %
Lymphocytes Absolute: 2.3 10*3/uL (ref 0.7–3.1)
Lymphs: 39 %
MCH: 31.6 pg (ref 26.6–33.0)
MCHC: 34.9 g/dL (ref 31.5–35.7)
MCV: 90 fL (ref 79–97)
Monocytes Absolute: 0.5 10*3/uL (ref 0.1–0.9)
Monocytes: 9 %
Neutrophils Absolute: 2.9 10*3/uL (ref 1.4–7.0)
Neutrophils: 49 %
Platelets: 189 10*3/uL (ref 150–379)
RBC: 5.29 x10E6/uL (ref 4.14–5.80)
RDW: 14.1 % (ref 12.3–15.4)
WBC: 5.9 10*3/uL (ref 3.4–10.8)

## 2015-08-16 LAB — COMPREHENSIVE METABOLIC PANEL
ALT: 14 IU/L (ref 0–44)
AST: 16 IU/L (ref 0–40)
Albumin/Globulin Ratio: 1.3 (ref 1.1–2.5)
Albumin: 3.7 g/dL (ref 3.5–5.5)
Alkaline Phosphatase: 74 IU/L (ref 39–117)
BUN/Creatinine Ratio: 18 (ref 9–20)
BUN: 12 mg/dL (ref 6–24)
Bilirubin Total: 0.3 mg/dL (ref 0.0–1.2)
CO2: 25 mmol/L (ref 18–29)
Calcium: 9.2 mg/dL (ref 8.7–10.2)
Chloride: 101 mmol/L (ref 97–106)
Creatinine, Ser: 0.67 mg/dL — ABNORMAL LOW (ref 0.76–1.27)
GFR calc Af Amer: 136 mL/min/{1.73_m2} (ref >59)
GFR calc non Af Amer: 118 mL/min/{1.73_m2} (ref >59)
Globulin, Total: 2.9 g/dL (ref 1.5–4.5)
Glucose: 61 mg/dL — ABNORMAL LOW (ref 65–99)
Potassium: 4.8 mmol/L (ref 3.5–5.2)
Sodium: 143 mmol/L (ref 136–144)
Total Protein: 6.6 g/dL (ref 6.0–8.5)

## 2015-08-16 LAB — VALPROIC ACID LEVEL: Valproic Acid Lvl: 60 ug/mL (ref 50–100)

## 2015-08-16 NOTE — Telephone Encounter (Signed)
I called the [place] [facility] assistance and she gave me another fax #, since the usual one is having a problem right now.  I faxed prescription with success.

## 2015-08-17 ENCOUNTER — Telehealth: Payer: Self-pay | Admitting: *Deleted

## 2015-08-17 NOTE — Telephone Encounter (Signed)
I spoke to pt and relayed the lab results to him, normal except glucose level was low.  He verbalized understanding.

## 2015-08-22 ENCOUNTER — Ambulatory Visit (HOSPITAL_BASED_OUTPATIENT_CLINIC_OR_DEPARTMENT_OTHER): Payer: Medicare Other | Admitting: Physical Medicine & Rehabilitation

## 2015-08-22 ENCOUNTER — Encounter: Payer: Self-pay | Admitting: Physical Medicine & Rehabilitation

## 2015-08-22 VITALS — BP 123/53 | HR 95 | Resp 16

## 2015-08-22 DIAGNOSIS — G8250 Quadriplegia, unspecified: Secondary | ICD-10-CM | POA: Diagnosis not present

## 2015-08-22 DIAGNOSIS — S14109S Unspecified injury at unspecified level of cervical spinal cord, sequela: Secondary | ICD-10-CM | POA: Diagnosis not present

## 2015-08-22 DIAGNOSIS — G40909 Epilepsy, unspecified, not intractable, without status epilepticus: Secondary | ICD-10-CM | POA: Diagnosis not present

## 2015-08-22 DIAGNOSIS — S14157S Other incomplete lesion at C7 level of cervical spinal cord, sequela: Secondary | ICD-10-CM | POA: Diagnosis not present

## 2015-08-22 DIAGNOSIS — Z720 Tobacco use: Secondary | ICD-10-CM | POA: Diagnosis not present

## 2015-08-22 DIAGNOSIS — R252 Cramp and spasm: Secondary | ICD-10-CM | POA: Diagnosis not present

## 2015-08-22 MED ORDER — BACLOFEN 10 MG PO TABS: 5.0000 mg | ORAL_TABLET | Freq: Two times a day (BID) | ORAL | Status: DC

## 2015-08-22 NOTE — Progress Notes (Signed)
Subjective:    Patient ID: [name], male    DOB: Aug 25, 1971, 44 y.o.   MRN: [id number]  HPI 44 year old male with C7 Asia B tetraplegia who returns today for a skin check. Patient had a left grade 2 greater trochanter decubitus ulcer one month ago. He was instructed with side-lying. He was using a foam dressing which he no longer uses. He was instructed not to lay on the left side but lay on the right side or on his back. He complains when he lays on his back he has shaking of both legs  Bladder using condom cath  and intermittent catheter  Once or twice a day  Pain Inventory Average Pain 9 Pain Right Now 1 My pain is tingling  In the last 24 hours, has pain interfered with the following? General activity 10 Relation with others 10 Enjoyment of life 8 What TIME of day is your pain at its worst? night Sleep (in general) NA  Pain is worse with: sitting Pain improves with: NA Relief from Meds: 8  Mobility do you drive?  yes use a wheelchair Do you have any goals in this area?  yes  Function not employed: date last employed NA  Neuro/Psych tingling spasms  Prior Studies Any changes since last visit?  no  Physicians involved in your care Any changes since last visit?  no   Family History  Problem Relation Age of Onset  . Hypertension Mother    Social History   Social History  . Marital Status: Single    Spouse Name: N/A  . Number of Children: N/A  . Years of Education: N/A   Occupational History  . Disable    Social History Main Topics  . Smoking status: Current Every Day Smoker -- 0.08 packs/day for 21 years    Types: Cigarettes  . Smokeless tobacco: Never Used  . Alcohol Use: No  . Drug Use: No  . Sexual Activity: Not Asked   Other Topics Concern  . None   Social History Narrative   Past Surgical History  Procedure Laterality Date  . Spine surgery    . Bladder surgery    . Incision and drainage perirectal abscess     Past Medical  History  Diagnosis Date  . Tobacco use 03/20/2012  . C5 spinal cord injury (HCC)   . Seizures (HCC)     focal  . Quadriplegia and quadriparesis (HCC) 08/12/2013  . 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 08/12/2013   BP 123/53 mmHg  Pulse 95  Resp 16  SpO2 95%  Opioid Risk Score:   Fall Risk Score:  `1  Depression screen PHQ 2/9  Depression screen [facility] 2/9 07/25/2015 01/25/2015  Decreased Interest 0 3  Down, Depressed, Hopeless 0 1  PHQ - 2 Score 0 4  Altered sleeping - 2  Tired, decreased energy - 0  Change in appetite - 0  Feeling bad or failure about yourself  - 0  Trouble concentrating - 0  Moving slowly or fidgety/restless - 1  Suicidal thoughts - 0  PHQ-9 Score - 7     Review of Systems  Musculoskeletal:       Spasms  Skin: Positive for rash.  All other systems reviewed and are negative.      Objective:   Physical Exam Patient with normal deltoid biceps but reduced finger flexor strength graded at 3 Clonus bilateral knee extensors provoked with tapping on patellar tendon Patient  with no lower extremity active range of motion. Skin over the hips shows no evidence of skin breakdown there looks like some old scarring from previous open areas. No erythema. He has no tenderness.      Assessment & Plan:  1. 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 Stable deficits. He has spasticity And is now open to trying a spasticity medication due to the fact he has difficulty laying on his back due to bilateral lower extremity spasms.Start baclofen 10 mg twice a day May need to titrate upward. Went over most common side effects  2. Left trochanteric decubitus improved. He has no open areas. Last visit he had a grade 2 and now he has avoided laying on that side. We discussed that he should not lay in one position more than 30 minutes. In addition he should lay more on his back which should be easier to do once his spasms get under better  control.  Return to clinic in 2-3 months

## 2015-08-22 NOTE — Patient Instructions (Signed)
Please use standing frame every day Start at 10 minutes per day and work up to 30 or 40 minutes per day  Baclofen tablets What is this medicine? BACLOFEN (BAK loe fen) helps relieve spasms and cramping of muscles. It may be used to treat symptoms of multiple sclerosis or spinal cord injury. This medicine may be used for other purposes; ask your health care provider or pharmacist if you have questions. What should I tell my health care provider before I take this medicine? They need to know if you have any of these conditions: -kidney disease -seizures -stroke -an unusual or allergic reaction to baclofen, other medicines, foods, dyes, or preservatives -pregnant or trying to get pregnant -breast-feeding How should I use this medicine? Take this medicine by mouth. Swallow it with a drink of water. Follow the directions on the prescription label. Do not take more medicine than you are told to take. Talk to your pediatrician regarding the use of this medicine in children. Special care may be needed. Overdosage: If you think you have taken too much of this medicine contact a poison control center or emergency room at once. NOTE: This medicine is only for you. Do not share this medicine with others. What if I miss a dose? If you miss a dose, take it as soon as you can. If it is almost time for your next dose, take only that dose. Do not take double or extra doses. What may interact with this medicine? -alcohol -antihistamines -medicines for depression, anxiety, and other mental conditions -medicines for pain like codeine, oxycodone, tramadol, and propoxyphene -medicines for sleep -phenobarbital This list may not describe all possible interactions. Give your health care provider a list of all the medicines, herbs, non-prescription drugs, or dietary supplements you use. Also tell them if you smoke, drink alcohol, or use illegal drugs. Some items may interact with your medicine. What should I  watch for while using this medicine? Do not suddenly stop taking your medicine. If you do, you may develop a severe reaction. If your doctor wants you to stop the medicine, the dose will be slowly lowered over time to avoid any side effects. Follow the advice of your doctor. Baclofen can affect blood sugar levels. If you have diabetes, talk with your doctor or health care professional before you take the medicine. You may get drowsy or dizzy when you first start taking the medicine or change doses. Do not drive, use machinery, or do anything that may be dangerous until you know how the medicine affects you. Stand or sit up slowly. What side effects may I notice from receiving this medicine? Side effects that you should report to your doctor or health care professional as soon as possible: -allergic reactions like skin rash, itching or hives, swelling of the face, lips, or tongue -chest pain -hallucinations -seizure Side effects that usually do not require medical attention (report to your doctor or health care professional if they continue or are bothersome): -confusion -difficulty sleeping -headache -nausea This list may not describe all possible side effects. Call your doctor for medical advice about side effects. You may report side effects to FDA at 1-800-FDA-1088. Where should I keep my medicine? Keep out of the reach of children. Store at room temperature between 15 and 30 degrees C (59 and 86 degrees F). Keep container tightly closed. Throw away any unused medicine after the expiration date. NOTE: This sheet is a summary. It may not cover all possible information. If you have questions  about this medicine, talk to your doctor, pharmacist, or health care provider.    2016, Elsevier/Gold Standard. (2008-01-08 13:22:23)

## 2015-10-11 ENCOUNTER — Telehealth: Payer: Self-pay

## 2015-10-11 NOTE — Telephone Encounter (Signed)
Marie from [facility] [employer] called stating that she needs an rx for his DME (condom caths, gloves, leg bag). Please advise? Or should urology follow up with this?

## 2015-10-23 DIAGNOSIS — S069XAA Unspecified intracranial injury with loss of consciousness status unknown, initial encounter: Secondary | ICD-10-CM | POA: Diagnosis present

## 2015-11-03 DIAGNOSIS — Z0000 Encounter for general adult medical examination without abnormal findings: Secondary | ICD-10-CM | POA: Diagnosis not present

## 2015-11-03 DIAGNOSIS — N3001 Acute cystitis with hematuria: Secondary | ICD-10-CM | POA: Diagnosis not present

## 2015-11-21 ENCOUNTER — Other Ambulatory Visit: Payer: Self-pay | Admitting: Neurology

## 2015-12-08 ENCOUNTER — Inpatient Hospital Stay (HOSPITAL_COMMUNITY): Payer: Medicare Other | Admitting: Anesthesiology

## 2015-12-08 ENCOUNTER — Encounter (HOSPITAL_COMMUNITY): Payer: Self-pay

## 2015-12-08 ENCOUNTER — Encounter (HOSPITAL_COMMUNITY): Admission: EM | Disposition: A | Discharge status: Rehabilitation Facility | Payer: Self-pay | Source: Home / Self Care

## 2015-12-08 ENCOUNTER — Inpatient Hospital Stay (HOSPITAL_COMMUNITY)
Admission: EM | Admit: 2015-12-08 | Discharge: 2015-12-23 | DRG: 474 | Disposition: A | Discharge status: Rehabilitation Facility | Payer: Medicare Other | Attending: Orthopedic Surgery | Admitting: Orthopedic Surgery

## 2015-12-08 ENCOUNTER — Emergency Department (HOSPITAL_COMMUNITY): Payer: Medicare Other

## 2015-12-08 DIAGNOSIS — R532 Functional quadriplegia: Secondary | ICD-10-CM | POA: Diagnosis not present

## 2015-12-08 DIAGNOSIS — L89153 Pressure ulcer of sacral region, stage 3: Secondary | ICD-10-CM | POA: Diagnosis present

## 2015-12-08 DIAGNOSIS — S82401E Unspecified fracture of shaft of right fibula, subsequent encounter for open fracture type I or II with routine healing: Secondary | ICD-10-CM | POA: Diagnosis not present

## 2015-12-08 DIAGNOSIS — Z419 Encounter for procedure for purposes other than remedying health state, unspecified: Secondary | ICD-10-CM

## 2015-12-08 DIAGNOSIS — M85871 Other specified disorders of bone density and structure, right ankle and foot: Secondary | ICD-10-CM | POA: Diagnosis not present

## 2015-12-08 DIAGNOSIS — Z472 Encounter for removal of internal fixation device: Secondary | ICD-10-CM | POA: Diagnosis not present

## 2015-12-08 DIAGNOSIS — S3993XA Unspecified injury of pelvis, initial encounter: Secondary | ICD-10-CM | POA: Diagnosis not present

## 2015-12-08 DIAGNOSIS — S14105S Unspecified injury at C5 level of cervical spinal cord, sequela: Secondary | ICD-10-CM | POA: Diagnosis not present

## 2015-12-08 DIAGNOSIS — W1789XS Other fall from one level to another, sequela: Secondary | ICD-10-CM

## 2015-12-08 DIAGNOSIS — S82452C Displaced comminuted fracture of shaft of left fibula, initial encounter for open fracture type IIIA, IIIB, or IIIC: Principal | ICD-10-CM | POA: Diagnosis present

## 2015-12-08 DIAGNOSIS — S82101A Unspecified fracture of upper end of right tibia, initial encounter for closed fracture: Secondary | ICD-10-CM | POA: Diagnosis present

## 2015-12-08 DIAGNOSIS — S82831B Other fracture of upper and lower end of right fibula, initial encounter for open fracture type I or II: Secondary | ICD-10-CM | POA: Diagnosis not present

## 2015-12-08 DIAGNOSIS — L03115 Cellulitis of right lower limb: Secondary | ICD-10-CM | POA: Diagnosis not present

## 2015-12-08 DIAGNOSIS — S82261B Displaced segmental fracture of shaft of right tibia, initial encounter for open fracture type I or II: Secondary | ICD-10-CM | POA: Diagnosis not present

## 2015-12-08 DIAGNOSIS — Z9889 Other specified postprocedural states: Secondary | ICD-10-CM | POA: Diagnosis not present

## 2015-12-08 DIAGNOSIS — S82201B Unspecified fracture of shaft of right tibia, initial encounter for open fracture type I or II: Secondary | ICD-10-CM | POA: Diagnosis present

## 2015-12-08 DIAGNOSIS — N390 Urinary tract infection, site not specified: Secondary | ICD-10-CM

## 2015-12-08 DIAGNOSIS — D62 Acute posthemorrhagic anemia: Secondary | ICD-10-CM | POA: Diagnosis not present

## 2015-12-08 DIAGNOSIS — T1490XA Injury, unspecified, initial encounter: Secondary | ICD-10-CM

## 2015-12-08 DIAGNOSIS — Z8249 Family history of ischemic heart disease and other diseases of the circulatory system: Secondary | ICD-10-CM

## 2015-12-08 DIAGNOSIS — S82461B Displaced segmental fracture of shaft of right fibula, initial encounter for open fracture type I or II: Secondary | ICD-10-CM | POA: Diagnosis not present

## 2015-12-08 DIAGNOSIS — G8220 Paraplegia, unspecified: Secondary | ICD-10-CM | POA: Diagnosis present

## 2015-12-08 DIAGNOSIS — S82201A Unspecified fracture of shaft of right tibia, initial encounter for closed fracture: Secondary | ICD-10-CM | POA: Diagnosis not present

## 2015-12-08 DIAGNOSIS — F17210 Nicotine dependence, cigarettes, uncomplicated: Secondary | ICD-10-CM | POA: Diagnosis present

## 2015-12-08 DIAGNOSIS — R0989 Other specified symptoms and signs involving the circulatory and respiratory systems: Secondary | ICD-10-CM | POA: Diagnosis not present

## 2015-12-08 DIAGNOSIS — Z23 Encounter for immunization: Secondary | ICD-10-CM | POA: Diagnosis not present

## 2015-12-08 DIAGNOSIS — S82202B Unspecified fracture of shaft of left tibia, initial encounter for open fracture type I or II: Secondary | ICD-10-CM | POA: Diagnosis present

## 2015-12-08 DIAGNOSIS — L8990 Pressure ulcer of unspecified site, unspecified stage: Secondary | ICD-10-CM | POA: Diagnosis not present

## 2015-12-08 DIAGNOSIS — K592 Neurogenic bowel, not elsewhere classified: Secondary | ICD-10-CM | POA: Diagnosis not present

## 2015-12-08 DIAGNOSIS — Z993 Dependence on wheelchair: Secondary | ICD-10-CM | POA: Diagnosis not present

## 2015-12-08 DIAGNOSIS — S89192A Other physeal fracture of lower end of left tibia, initial encounter for closed fracture: Secondary | ICD-10-CM | POA: Diagnosis not present

## 2015-12-08 DIAGNOSIS — G40309 Generalized idiopathic epilepsy and epileptic syndromes, not intractable, without status epilepticus: Secondary | ICD-10-CM | POA: Diagnosis present

## 2015-12-08 DIAGNOSIS — S0990XA Unspecified injury of head, initial encounter: Secondary | ICD-10-CM | POA: Diagnosis not present

## 2015-12-08 DIAGNOSIS — S82872A Displaced pilon fracture of left tibia, initial encounter for closed fracture: Secondary | ICD-10-CM | POA: Diagnosis not present

## 2015-12-08 DIAGNOSIS — G8250 Quadriplegia, unspecified: Secondary | ICD-10-CM | POA: Diagnosis not present

## 2015-12-08 DIAGNOSIS — S82832A Other fracture of upper and lower end of left fibula, initial encounter for closed fracture: Secondary | ICD-10-CM | POA: Diagnosis not present

## 2015-12-08 DIAGNOSIS — S82872B Displaced pilon fracture of left tibia, initial encounter for open fracture type I or II: Secondary | ICD-10-CM | POA: Diagnosis not present

## 2015-12-08 DIAGNOSIS — T148XXA Other injury of unspecified body region, initial encounter: Secondary | ICD-10-CM

## 2015-12-08 DIAGNOSIS — S82872C Displaced pilon fracture of left tibia, initial encounter for open fracture type IIIA, IIIB, or IIIC: Secondary | ICD-10-CM | POA: Diagnosis present

## 2015-12-08 DIAGNOSIS — S82261C Displaced segmental fracture of shaft of right tibia, initial encounter for open fracture type IIIA, IIIB, or IIIC: Secondary | ICD-10-CM | POA: Diagnosis not present

## 2015-12-08 DIAGNOSIS — T148 Other injury of unspecified body region: Secondary | ICD-10-CM | POA: Diagnosis not present

## 2015-12-08 DIAGNOSIS — S82251A Displaced comminuted fracture of shaft of right tibia, initial encounter for closed fracture: Secondary | ICD-10-CM | POA: Diagnosis not present

## 2015-12-08 DIAGNOSIS — S82875C Nondisplaced pilon fracture of left tibia, initial encounter for open fracture type IIIA, IIIB, or IIIC: Secondary | ICD-10-CM | POA: Diagnosis not present

## 2015-12-08 DIAGNOSIS — R509 Fever, unspecified: Secondary | ICD-10-CM

## 2015-12-08 DIAGNOSIS — S82202C Unspecified fracture of shaft of left tibia, initial encounter for open fracture type IIIA, IIIB, or IIIC: Secondary | ICD-10-CM

## 2015-12-08 DIAGNOSIS — G40409 Other generalized epilepsy and epileptic syndromes, not intractable, without status epilepticus: Secondary | ICD-10-CM | POA: Diagnosis present

## 2015-12-08 DIAGNOSIS — S299XXA Unspecified injury of thorax, initial encounter: Secondary | ICD-10-CM | POA: Diagnosis not present

## 2015-12-08 DIAGNOSIS — S14109A Unspecified injury at unspecified level of cervical spinal cord, initial encounter: Secondary | ICD-10-CM | POA: Diagnosis present

## 2015-12-08 DIAGNOSIS — S8292XA Unspecified fracture of left lower leg, initial encounter for closed fracture: Secondary | ICD-10-CM

## 2015-12-08 DIAGNOSIS — S82451B Displaced comminuted fracture of shaft of right fibula, initial encounter for open fracture type I or II: Secondary | ICD-10-CM | POA: Diagnosis present

## 2015-12-08 DIAGNOSIS — N319 Neuromuscular dysfunction of bladder, unspecified: Secondary | ICD-10-CM | POA: Diagnosis present

## 2015-12-08 DIAGNOSIS — S79921A Unspecified injury of right thigh, initial encounter: Secondary | ICD-10-CM | POA: Diagnosis not present

## 2015-12-08 DIAGNOSIS — S82201C Unspecified fracture of shaft of right tibia, initial encounter for open fracture type IIIA, IIIB, or IIIC: Secondary | ICD-10-CM

## 2015-12-08 DIAGNOSIS — S82261E Displaced segmental fracture of shaft of right tibia, subsequent encounter for open fracture type I or II with routine healing: Secondary | ICD-10-CM | POA: Diagnosis not present

## 2015-12-08 DIAGNOSIS — Z720 Tobacco use: Secondary | ICD-10-CM | POA: Diagnosis present

## 2015-12-08 DIAGNOSIS — S82401A Unspecified fracture of shaft of right fibula, initial encounter for closed fracture: Secondary | ICD-10-CM | POA: Diagnosis not present

## 2015-12-08 DIAGNOSIS — S79922A Unspecified injury of left thigh, initial encounter: Secondary | ICD-10-CM | POA: Diagnosis not present

## 2015-12-08 DIAGNOSIS — S199XXA Unspecified injury of neck, initial encounter: Secondary | ICD-10-CM | POA: Diagnosis not present

## 2015-12-08 DIAGNOSIS — S14157S Other incomplete lesion at C7 level of cervical spinal cord, sequela: Secondary | ICD-10-CM

## 2015-12-08 DIAGNOSIS — S82872F Displaced pilon fracture of left tibia, subsequent encounter for open fracture type IIIA, IIIB, or IIIC with routine healing: Secondary | ICD-10-CM | POA: Diagnosis not present

## 2015-12-08 DIAGNOSIS — S82252A Displaced comminuted fracture of shaft of left tibia, initial encounter for closed fracture: Secondary | ICD-10-CM | POA: Diagnosis not present

## 2015-12-08 DIAGNOSIS — S88011A Complete traumatic amputation at knee level, right lower leg, initial encounter: Secondary | ICD-10-CM | POA: Diagnosis not present

## 2015-12-08 DIAGNOSIS — A499 Bacterial infection, unspecified: Secondary | ICD-10-CM

## 2015-12-08 DIAGNOSIS — S8291XA Unspecified fracture of right lower leg, initial encounter for closed fracture: Secondary | ICD-10-CM | POA: Diagnosis present

## 2015-12-08 DIAGNOSIS — B3749 Other urogenital candidiasis: Secondary | ICD-10-CM | POA: Diagnosis not present

## 2015-12-08 DIAGNOSIS — S88012A Complete traumatic amputation at knee level, left lower leg, initial encounter: Secondary | ICD-10-CM | POA: Diagnosis not present

## 2015-12-08 DIAGNOSIS — T1490 Injury, unspecified: Secondary | ICD-10-CM | POA: Diagnosis not present

## 2015-12-08 DIAGNOSIS — S3991XA Unspecified injury of abdomen, initial encounter: Secondary | ICD-10-CM | POA: Diagnosis not present

## 2015-12-08 DIAGNOSIS — Z89512 Acquired absence of left leg below knee: Secondary | ICD-10-CM | POA: Diagnosis not present

## 2015-12-08 DIAGNOSIS — S82401B Unspecified fracture of shaft of right fibula, initial encounter for open fracture type I or II: Secondary | ICD-10-CM | POA: Diagnosis not present

## 2015-12-08 DIAGNOSIS — S82831A Other fracture of upper and lower end of right fibula, initial encounter for closed fracture: Secondary | ICD-10-CM | POA: Diagnosis not present

## 2015-12-08 HISTORY — PX: I & D EXTREMITY: SHX5045

## 2015-12-08 HISTORY — DX: Unspecified fracture of shaft of right tibia, initial encounter for open fracture type I or II: S82.201B

## 2015-12-08 HISTORY — PX: EXTERNAL FIXATION LEG: SHX1549

## 2015-12-08 HISTORY — DX: Unspecified fracture of shaft of right tibia, initial encounter for open fracture type I or II: S82.202B

## 2015-12-08 HISTORY — PX: APPLICATION OF WOUND VAC: SHX5189

## 2015-12-08 LAB — PROTIME-INR
INR: 1.23 (ref 0.00–1.49)
Prothrombin Time: 15.7 seconds — ABNORMAL HIGH (ref 11.6–15.2)

## 2015-12-08 LAB — CDS SEROLOGY

## 2015-12-08 LAB — CBC
HCT: 42.4 % (ref 39.0–52.0)
Hemoglobin: 14.4 g/dL (ref 13.0–17.0)
MCH: 31.5 pg (ref 26.0–34.0)
MCHC: 34 g/dL (ref 30.0–36.0)
MCV: 92.8 fL (ref 78.0–100.0)
Platelets: 139 10*3/uL — ABNORMAL LOW (ref 150–400)
RBC: 4.57 MIL/uL (ref 4.22–5.81)
RDW: 13.3 % (ref 11.5–15.5)
WBC: 11.5 10*3/uL — ABNORMAL HIGH (ref 4.0–10.5)

## 2015-12-08 LAB — ETHANOL: Alcohol, Ethyl (B): <5 mg/dL (ref <5)

## 2015-12-08 LAB — COMPREHENSIVE METABOLIC PANEL
ALT: 19 U/L (ref 17–63)
AST: 27 U/L (ref 15–41)
Albumin: 2.8 g/dL — ABNORMAL LOW (ref 3.5–5.0)
Alkaline Phosphatase: 59 U/L (ref 38–126)
Anion gap: 11 (ref 5–15)
BUN: 16 mg/dL (ref 6–20)
CO2: 21 mmol/L — ABNORMAL LOW (ref 22–32)
Calcium: 8.3 mg/dL — ABNORMAL LOW (ref 8.9–10.3)
Chloride: 108 mmol/L (ref 101–111)
Creatinine, Ser: 1.06 mg/dL (ref 0.61–1.24)
GFR calc Af Amer: >60 mL/min (ref >60)
GFR calc non Af Amer: >60 mL/min (ref >60)
Glucose, Bld: 93 mg/dL (ref 65–99)
Potassium: 4.5 mmol/L (ref 3.5–5.1)
Sodium: 140 mmol/L (ref 135–145)
Total Bilirubin: 1 mg/dL (ref 0.3–1.2)
Total Protein: 5.7 g/dL — ABNORMAL LOW (ref 6.5–8.1)

## 2015-12-08 LAB — ABO/RH: ABO/RH(D): O NEG

## 2015-12-08 SURGERY — IRRIGATION AND DEBRIDEMENT EXTREMITY
Anesthesia: General | Site: Leg Lower | Laterality: Bilateral

## 2015-12-08 MED ORDER — CEFAZOLIN SODIUM 1-5 GM-% IV SOLN
1.0000 g | Freq: Once | INTRAVENOUS | Status: AC
  Administered 2015-12-08: 1 g via INTRAVENOUS
  Filled 2015-12-08: qty 50

## 2015-12-08 MED ORDER — LACTATED RINGERS IV SOLN
INTRAVENOUS | Status: DC | PRN
  Administered 2015-12-08 (×2): via INTRAVENOUS

## 2015-12-08 MED ORDER — ROCURONIUM BROMIDE 100 MG/10ML IV SOLN
INTRAVENOUS | Status: DC | PRN
  Administered 2015-12-08: 30 mg via INTRAVENOUS

## 2015-12-08 MED ORDER — CEFAZOLIN SODIUM-DEXTROSE 2-3 GM-% IV SOLR
INTRAVENOUS | Status: AC
  Filled 2015-12-08: qty 50

## 2015-12-08 MED ORDER — HYDROMORPHONE HCL 1 MG/ML IJ SOLN
1.0000 mg | Freq: Once | INTRAMUSCULAR | Status: DC
  Filled 2015-12-08: qty 1

## 2015-12-08 MED ORDER — ROCURONIUM BROMIDE 50 MG/5ML IV SOLN
INTRAVENOUS | Status: AC
  Filled 2015-12-08: qty 1

## 2015-12-08 MED ORDER — MIDAZOLAM HCL 5 MG/5ML IJ SOLN
INTRAMUSCULAR | Status: DC | PRN
Start: 2015-12-08 — End: 2015-12-09
  Administered 2015-12-08: 2 mg via INTRAVENOUS

## 2015-12-08 MED ORDER — TETANUS-DIPHTH-ACELL PERTUSSIS 5-2.5-18.5 LF-MCG/0.5 IM SUSP
0.5000 mL | Freq: Once | INTRAMUSCULAR | Status: AC
  Administered 2015-12-08: 0.5 mL via INTRAMUSCULAR
  Filled 2015-12-08: qty 0.5

## 2015-12-08 MED ORDER — PROPOFOL 10 MG/ML IV BOLUS
INTRAVENOUS | Status: AC
  Filled 2015-12-08: qty 20

## 2015-12-08 MED ORDER — DEXAMETHASONE SODIUM PHOSPHATE 4 MG/ML IJ SOLN
INTRAMUSCULAR | Status: DC | PRN
  Administered 2015-12-08: 4 mg via INTRAVENOUS

## 2015-12-08 MED ORDER — FENTANYL CITRATE (PF) 100 MCG/2ML IJ SOLN
INTRAMUSCULAR | Status: DC | PRN
  Administered 2015-12-08: 100 ug via INTRAVENOUS
  Administered 2015-12-08: 50 ug via INTRAVENOUS
  Administered 2015-12-08: 100 ug via INTRAVENOUS

## 2015-12-08 MED ORDER — ONDANSETRON HCL 4 MG/2ML IJ SOLN
INTRAMUSCULAR | Status: AC
  Filled 2015-12-08: qty 2

## 2015-12-08 MED ORDER — SUCCINYLCHOLINE CHLORIDE 20 MG/ML IJ SOLN
INTRAMUSCULAR | Status: AC
  Filled 2015-12-08: qty 1

## 2015-12-08 MED ORDER — 0.9 % SODIUM CHLORIDE (POUR BTL) OPTIME
TOPICAL | Status: DC | PRN
  Administered 2015-12-08: 1000 mL

## 2015-12-08 MED ORDER — LIDOCAINE HCL (CARDIAC) 20 MG/ML IV SOLN
INTRAVENOUS | Status: AC
  Filled 2015-12-08: qty 5

## 2015-12-08 MED ORDER — PHENYLEPHRINE HCL 10 MG/ML IJ SOLN
10.0000 mg | INTRAMUSCULAR | Status: DC | PRN
Start: 2015-12-08 — End: 2015-12-09
  Administered 2015-12-08: 50 ug/min via INTRAVENOUS

## 2015-12-08 MED ORDER — FENTANYL CITRATE (PF) 250 MCG/5ML IJ SOLN
INTRAMUSCULAR | Status: AC
  Filled 2015-12-08: qty 5

## 2015-12-08 MED ORDER — CEFAZOLIN SODIUM-DEXTROSE 2-3 GM-% IV SOLR
2.0000 g | Freq: Once | INTRAVENOUS | Status: AC
  Administered 2015-12-08: 2 g via INTRAVENOUS

## 2015-12-08 MED ORDER — SODIUM CHLORIDE 0.9 % IV BOLUS (SEPSIS)
1000.0000 mL | Freq: Once | INTRAVENOUS | Status: AC
  Administered 2015-12-08: 1000 mL via INTRAVENOUS

## 2015-12-08 MED ORDER — PROPOFOL 10 MG/ML IV BOLUS
INTRAVENOUS | Status: DC | PRN
  Administered 2015-12-08: 50 mg via INTRAVENOUS
  Administered 2015-12-08: 100 mg via INTRAVENOUS

## 2015-12-08 MED ORDER — EPHEDRINE SULFATE 50 MG/ML IJ SOLN
INTRAMUSCULAR | Status: AC
  Filled 2015-12-08: qty 1

## 2015-12-08 MED ORDER — MIDAZOLAM HCL 2 MG/2ML IJ SOLN
INTRAMUSCULAR | Status: AC
  Filled 2015-12-08: qty 2

## 2015-12-08 MED ORDER — IOHEXOL 300 MG/ML  SOLN
100.0000 mL | Freq: Once | INTRAMUSCULAR | Status: AC | PRN
  Administered 2015-12-08: 100 mL via INTRAVENOUS

## 2015-12-08 MED ORDER — EPHEDRINE SULFATE 50 MG/ML IJ SOLN
INTRAMUSCULAR | Status: DC | PRN
  Administered 2015-12-08: 10 mg via INTRAVENOUS

## 2015-12-08 MED ORDER — SODIUM CHLORIDE 0.9 % IR SOLN
Status: DC | PRN
  Administered 2015-12-08: 3000 mL

## 2015-12-08 SURGICAL SUPPLY — 105 items
BANDAGE ACE 6X5 VEL STRL LF (GAUZE/BANDAGES/DRESSINGS) ×3 IMPLANT
BANDAGE ELASTIC 3 VELCRO ST LF (GAUZE/BANDAGES/DRESSINGS) IMPLANT
BANDAGE ELASTIC 4 VELCRO ST LF (GAUZE/BANDAGES/DRESSINGS) ×4 IMPLANT
BANDAGE ELASTIC 6 VELCRO ST LF (GAUZE/BANDAGES/DRESSINGS) ×4 IMPLANT
BANDAGE ESMARK 6X9 LF (GAUZE/BANDAGES/DRESSINGS) ×2 IMPLANT
BAR EXFX 150X11 NS LF (EXFIX) ×4
BAR EXFX 350X11 NS LF (EXFIX) ×4
BAR EXFX 600X11 NS LF (MISCELLANEOUS) ×4
BAR GLASS FIBER EXFX 11X150 (EXFIX) ×6 IMPLANT
BAR GLASS FIBER EXFX 11X350 (EXFIX) ×4 IMPLANT
BAR GLASS FIBER EXFX 11X600 (MISCELLANEOUS) ×6 IMPLANT
BIT DRILL CANN MED FLUTE 4.0 (BIT) IMPLANT
BLADE SURG 10 STRL SS (BLADE) ×4 IMPLANT
BNDG CMPR 9X6 STRL LF SNTH (GAUZE/BANDAGES/DRESSINGS) ×2
BNDG COHESIVE 1X5 TAN STRL LF (GAUZE/BANDAGES/DRESSINGS) IMPLANT
BNDG COHESIVE 4X5 TAN STRL (GAUZE/BANDAGES/DRESSINGS) ×1 IMPLANT
BNDG COHESIVE 6X5 TAN STRL LF (GAUZE/BANDAGES/DRESSINGS) ×2 IMPLANT
BNDG CONFORM 3 STRL LF (GAUZE/BANDAGES/DRESSINGS) IMPLANT
BNDG ESMARK 6X9 LF (GAUZE/BANDAGES/DRESSINGS) ×4
BNDG GAUZE ELAST 4 BULKY (GAUZE/BANDAGES/DRESSINGS) ×2 IMPLANT
BNDG GAUZE STRTCH 6 (GAUZE/BANDAGES/DRESSINGS) ×9 IMPLANT
CANISTER WOUND CARE 500ML ATS (WOUND CARE) ×3 IMPLANT
CLAMP BLUE BAR TO BAR (MISCELLANEOUS) ×8 IMPLANT
CLAMP BLUE BAR TO PIN (MISCELLANEOUS) ×12 IMPLANT
CORDS BIPOLAR (ELECTRODE) IMPLANT
COVER MAYO STAND STRL (DRAPES) ×4 IMPLANT
COVER PROBE W GEL 5X96 (DRAPES) ×2 IMPLANT
COVER SURGICAL LIGHT HANDLE (MISCELLANEOUS) ×4 IMPLANT
CUFF TOURNIQUET SINGLE 24IN (TOURNIQUET CUFF) IMPLANT
CUFF TOURNIQUET SINGLE 34IN LL (TOURNIQUET CUFF) ×2 IMPLANT
CUFF TOURNIQUET SINGLE 44IN (TOURNIQUET CUFF) IMPLANT
DRAPE C-ARM 42X72 X-RAY (DRAPES) ×4 IMPLANT
DRAPE C-ARMOR (DRAPES) ×8 IMPLANT
DRAPE EXTREMITY BILATERAL (DRAPES) ×3 IMPLANT
DRAPE EXTREMITY T 121X128X90 (DRAPE) ×4 IMPLANT
DRAPE IMP U-DRAPE 54X76 (DRAPES) ×6 IMPLANT
DRAPE INCISE IOBAN 66X45 STRL (DRAPES) ×16 IMPLANT
DRAPE POUCH INSTRU U-SHP 10X18 (DRAPES) ×4 IMPLANT
DRAPE PROXIMA HALF (DRAPES) ×3 IMPLANT
DRAPE SURG 17X23 STRL (DRAPES) IMPLANT
DRAPE U-SHAPE 47X51 STRL (DRAPES) ×4 IMPLANT
DRAPE UTILITY XL STRL (DRAPES) ×8 IMPLANT
DRILL CANN 4.0MM (BIT) ×4
DRSG VAC ATS MED SENSATRAC (GAUZE/BANDAGES/DRESSINGS) ×2 IMPLANT
DURAPREP 26ML APPLICATOR (WOUND CARE) ×7 IMPLANT
ELECT CAUTERY BLADE 6.4 (BLADE) ×4 IMPLANT
ELECT REM PT RETURN 9FT ADLT (ELECTROSURGICAL) ×4
ELECTRODE REM PT RTRN 9FT ADLT (ELECTROSURGICAL) ×2 IMPLANT
FACESHIELD WRAPAROUND (MASK) IMPLANT
FACESHIELD WRAPAROUND OR TEAM (MASK) IMPLANT
GAUZE SPONGE 4X4 12PLY STRL (GAUZE/BANDAGES/DRESSINGS) ×8 IMPLANT
GAUZE XEROFORM 1X8 LF (GAUZE/BANDAGES/DRESSINGS) ×8 IMPLANT
GAUZE XEROFORM 5X9 LF (GAUZE/BANDAGES/DRESSINGS) ×6 IMPLANT
GLOVE BIOGEL PI IND STRL 6.5 (GLOVE) IMPLANT
GLOVE BIOGEL PI IND STRL 7.0 (GLOVE) ×1 IMPLANT
GLOVE BIOGEL PI INDICATOR 6.5 (GLOVE) ×4
GLOVE BIOGEL PI INDICATOR 7.0 (GLOVE) ×2
GLOVE SKINSENSE NS SZ7.5 (GLOVE) ×8
GLOVE SKINSENSE STRL SZ7.5 (GLOVE) ×8 IMPLANT
GOWN STRL REIN XL XLG (GOWN DISPOSABLE) ×8 IMPLANT
HALF PIN 5.0X160 (PIN) ×6 IMPLANT
HANDPIECE INTERPULSE COAX TIP (DISPOSABLE)
KIT BASIN OR (CUSTOM PROCEDURE TRAY) ×7 IMPLANT
KIT ROOM TURNOVER OR (KITS) ×4 IMPLANT
MANIFOLD NEPTUNE II (INSTRUMENTS) ×4 IMPLANT
NS IRRIG 1000ML POUR BTL (IV SOLUTION) ×8 IMPLANT
PACK ORTHO EXTREMITY (CUSTOM PROCEDURE TRAY) ×4 IMPLANT
PACK TOTAL JOINT (CUSTOM PROCEDURE TRAY) ×4 IMPLANT
PACK UNIVERSAL I (CUSTOM PROCEDURE TRAY) ×4 IMPLANT
PAD ABD 8X10 STRL (GAUZE/BANDAGES/DRESSINGS) ×1 IMPLANT
PAD ARMBOARD 7.5X6 YLW CONV (MISCELLANEOUS) ×12 IMPLANT
PAD CAST 4YDX4 CTTN HI CHSV (CAST SUPPLIES) ×4 IMPLANT
PADDING CAST ABS 4INX4YD NS (CAST SUPPLIES)
PADDING CAST ABS COTTON 4X4 ST (CAST SUPPLIES) IMPLANT
PADDING CAST COTTON 4X4 STRL (CAST SUPPLIES) ×8
PADDING CAST COTTON 6X4 STRL (CAST SUPPLIES) ×2 IMPLANT
PIN CLAMP 2BAR 75MM BLUE (PIN) ×6 IMPLANT
PIN HALF YELLOW 5X160X35 (PIN) ×8 IMPLANT
PIN TRANSFIXING 5.0 (PIN) ×3 IMPLANT
SET HNDPC FAN SPRY TIP SCT (DISPOSABLE) IMPLANT
SPONGE GAUZE 4X4 12PLY STER LF (GAUZE/BANDAGES/DRESSINGS) ×2 IMPLANT
SPONGE LAP 18X18 X RAY DECT (DISPOSABLE) ×10 IMPLANT
STAPLER SKIN PROX WIDE 3.9 (STAPLE) ×4 IMPLANT
STOCKINETTE IMPERVIOUS 9X36 MD (GAUZE/BANDAGES/DRESSINGS) ×4 IMPLANT
SUT ETHILON 2 0 FS 18 (SUTURE) ×10 IMPLANT
SUT ETHILON 2 0 PSLX (SUTURE) ×2 IMPLANT
SUT ETHILON 3 0 PS 1 (SUTURE) IMPLANT
SUT VIC AB 0 CT1 27 (SUTURE) ×8
SUT VIC AB 0 CT1 27XBRD ANTBC (SUTURE) ×4 IMPLANT
SUT VIC AB 2-0 CT1 27 (SUTURE) ×8
SUT VIC AB 2-0 CT1 36 (SUTURE) ×2 IMPLANT
SUT VIC AB 2-0 CT1 TAPERPNT 27 (SUTURE) ×4 IMPLANT
SUT VIC AB 2-0 FS1 27 (SUTURE) ×2 IMPLANT
SYR CONTROL 10ML LL (SYRINGE) IMPLANT
TOWEL OR 17X24 6PK STRL BLUE (TOWEL DISPOSABLE) ×4 IMPLANT
TOWEL OR 17X26 10 PK STRL BLUE (TOWEL DISPOSABLE) ×8 IMPLANT
TRAY FOLEY CATH SILVER 16FR (SET/KITS/TRAYS/PACK) ×2 IMPLANT
TUBE ANAEROBIC SPECIMEN COL (MISCELLANEOUS) IMPLANT
TUBE CONNECTING 12'X1/4 (SUCTIONS) ×1
TUBE CONNECTING 12X1/4 (SUCTIONS) ×3 IMPLANT
TUBE FEEDING 5FR 15 INCH (TUBING) IMPLANT
TUBING CYSTO DISP (UROLOGICAL SUPPLIES) ×4 IMPLANT
UNDERPAD 30X30 INCONTINENT (UNDERPADS AND DIAPERS) ×8 IMPLANT
WATER STERILE IRR 1000ML POUR (IV SOLUTION) ×4 IMPLANT
YANKAUER SUCT BULB TIP NO VENT (SUCTIONS) ×7 IMPLANT
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
## 2015-12-08 NOTE — ED Notes (Signed)
1st unit ER RBCs started via pressure bag.
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
## 2015-12-08 NOTE — Progress Notes (Signed)
Orthopedic Tech Progress Note Patient Details:  [name] 06-03-1971 [id number]  Ortho Devices Type of Ortho Device: Post (short leg) splint, Stirrup splint Ortho Device/Splint Location: bi-le post short leg and stirrup Ortho Device/Splint Interventions: Ordered, Application   [name] 12/08/2015, 8:42 PM
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
## 2015-12-08 NOTE — Anesthesia Procedure Notes (Signed)
Procedure Name: Intubation Date/Time: 12/08/2015 10:37 PM Performed by: Gavin Pound, Haddy Mullinax J Pre-anesthesia Checklist: Patient identified, Emergency Drugs available, Suction available, Patient being monitored and Timeout performed Patient Re-evaluated:Patient Re-evaluated prior to inductionOxygen Delivery Method: Circle system utilized Preoxygenation: Pre-oxygenation with 100% oxygen Intubation Type: IV induction, Cricoid Pressure applied and Rapid sequence Ventilation: Mask ventilation without difficulty Laryngoscope Size: Mac and 4 Grade View: Grade II Tube type: Oral Tube size: 7.5 mm Number of attempts: 1 Placement Confirmation: ETT inserted through vocal cords under direct vision,  positive ETCO2 and breath sounds checked- equal and bilateral Secured at: 22 cm Tube secured with: Tape Dental Injury: Teeth and Oropharynx as per pre-operative assessment

## 2015-12-08 NOTE — ED Notes (Signed)
Emergency release 1st unit #: [id number]

## 2015-12-08 NOTE — Anesthesia Preprocedure Evaluation (Signed)
Anesthesia Evaluation  Patient identified by MRN, date of birth, ID band Patient awake    Reviewed: Allergy & Precautions, NPO status   Airway Mallampati: I  TM Distance: >3 FB Neck ROM: Full    Dental   Pulmonary Current Smoker,    Pulmonary exam normal        Cardiovascular Normal cardiovascular exam     Neuro/Psych Seizures -,     GI/Hepatic   Endo/Other    Renal/GU      Musculoskeletal   Abdominal   Peds  Hematology   Anesthesia Other Findings   Reproductive/Obstetrics                             Anesthesia Physical Anesthesia Plan  ASA: III and emergent  Anesthesia Plan: General   Post-op Pain Management:    Induction: Intravenous, Rapid sequence and Cricoid pressure planned  Airway Management Planned: Oral ETT  Additional Equipment:   Intra-op Plan:   Post-operative Plan: Extubation in OR  Informed Consent: I have reviewed the patients History and Physical, chart, labs and discussed the procedure including the risks, benefits and alternatives for the proposed anesthesia with the patient or authorized representative who has indicated his/her understanding and acceptance.     Plan Discussed with: CRNA and Surgeon  Anesthesia Plan Comments:         Anesthesia Quick Evaluation
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
## 2015-12-08 NOTE — ED Notes (Signed)
1st Unit Emergency release RBCs completed via pressure bag infusion.
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
## 2015-12-09 ENCOUNTER — Inpatient Hospital Stay (HOSPITAL_COMMUNITY): Payer: Medicare Other

## 2015-12-09 DIAGNOSIS — R0989 Other specified symptoms and signs involving the circulatory and respiratory systems: Secondary | ICD-10-CM

## 2015-12-09 DIAGNOSIS — S8292XA Unspecified fracture of left lower leg, initial encounter for closed fracture: Secondary | ICD-10-CM

## 2015-12-09 DIAGNOSIS — S8291XA Unspecified fracture of right lower leg, initial encounter for closed fracture: Secondary | ICD-10-CM | POA: Diagnosis present

## 2015-12-09 LAB — BASIC METABOLIC PANEL
Anion gap: 6 (ref 5–15)
BUN: 17 mg/dL (ref 6–20)
CO2: 25 mmol/L (ref 22–32)
Calcium: 7.9 mg/dL — ABNORMAL LOW (ref 8.9–10.3)
Chloride: 109 mmol/L (ref 101–111)
Creatinine, Ser: 0.99 mg/dL (ref 0.61–1.24)
GFR calc Af Amer: >60 mL/min (ref >60)
GFR calc non Af Amer: >60 mL/min (ref >60)
Glucose, Bld: 144 mg/dL — ABNORMAL HIGH (ref 65–99)
Potassium: 4.9 mmol/L (ref 3.5–5.1)
Sodium: 140 mmol/L (ref 135–145)

## 2015-12-09 LAB — PREPARE FRESH FROZEN PLASMA
Unit division: 0
Unit division: 0

## 2015-12-09 LAB — BLOOD PRODUCT ORDER (VERBAL) VERIFICATION

## 2015-12-09 LAB — MRSA PCR SCREENING: MRSA by PCR: NEGATIVE

## 2015-12-09 LAB — CBC
HCT: 31.4 % — ABNORMAL LOW (ref 39.0–52.0)
Hemoglobin: 10.5 g/dL — ABNORMAL LOW (ref 13.0–17.0)
MCH: 30.3 pg (ref 26.0–34.0)
MCHC: 33.4 g/dL (ref 30.0–36.0)
MCV: 90.8 fL (ref 78.0–100.0)
Platelets: 128 10*3/uL — ABNORMAL LOW (ref 150–400)
RBC: 3.46 MIL/uL — ABNORMAL LOW (ref 4.22–5.81)
RDW: 14 % (ref 11.5–15.5)
WBC: 9.4 10*3/uL (ref 4.0–10.5)

## 2015-12-09 MED ORDER — DIVALPROEX SODIUM ER 500 MG PO TB24
500.0000 mg | ORAL_TABLET | Freq: Two times a day (BID) | ORAL | Status: DC
  Filled 2015-12-09 (×2): qty 1

## 2015-12-09 MED ORDER — DIVALPROEX SODIUM ER 500 MG PO TB24
500.0000 mg | ORAL_TABLET | Freq: Two times a day (BID) | ORAL | Status: DC
  Administered 2015-12-09: 500 mg via ORAL
  Filled 2015-12-09 (×2): qty 1

## 2015-12-09 MED ORDER — PANTOPRAZOLE SODIUM 40 MG IV SOLR
40.0000 mg | Freq: Every day | INTRAVENOUS | Status: DC
  Administered 2015-12-10: 40 mg via INTRAVENOUS
  Filled 2015-12-09: qty 40

## 2015-12-09 MED ORDER — HYDROMORPHONE HCL 1 MG/ML IJ SOLN: 0.5000 mg | INTRAMUSCULAR | Status: DC | PRN

## 2015-12-09 MED ORDER — ONDANSETRON HCL 4 MG/2ML IJ SOLN
INTRAMUSCULAR | Status: DC | PRN
  Administered 2015-12-09: 4 mg via INTRAVENOUS

## 2015-12-09 MED ORDER — ALBUMIN HUMAN 5 % IV SOLN
12.5000 g | Freq: Once | INTRAVENOUS | Status: AC
  Administered 2015-12-09: 12.5 g via INTRAVENOUS

## 2015-12-09 MED ORDER — KCL IN DEXTROSE-NACL 20-5-0.45 MEQ/L-%-% IV SOLN
INTRAVENOUS | Status: AC
  Filled 2015-12-09: qty 1000

## 2015-12-09 MED ORDER — ALBUMIN HUMAN 5 % IV SOLN
INTRAVENOUS | Status: AC
  Filled 2015-12-09: qty 250

## 2015-12-09 MED ORDER — KCL IN DEXTROSE-NACL 20-5-0.45 MEQ/L-%-% IV SOLN
INTRAVENOUS | Status: DC
  Administered 2015-12-10: 20:00:00 via INTRAVENOUS
  Filled 2015-12-09 (×7): qty 1000

## 2015-12-09 MED ORDER — DIVALPROEX SODIUM ER 500 MG PO TB24
500.0000 mg | ORAL_TABLET | Freq: Two times a day (BID) | ORAL | Status: DC
  Administered 2015-12-09 – 2015-12-23 (×27): 500 mg via ORAL
  Filled 2015-12-09 (×29): qty 1

## 2015-12-09 MED ORDER — ONDANSETRON HCL 4 MG/2ML IJ SOLN
4.0000 mg | Freq: Four times a day (QID) | INTRAMUSCULAR | Status: DC | PRN
Start: 2015-12-09 — End: 2015-12-23

## 2015-12-09 MED ORDER — ONDANSETRON HCL 4 MG PO TABS: 4.0000 mg | ORAL_TABLET | Freq: Four times a day (QID) | ORAL | Status: DC | PRN

## 2015-12-09 MED ORDER — CEFAZOLIN SODIUM-DEXTROSE 2-3 GM-% IV SOLR
2.0000 g | Freq: Three times a day (TID) | INTRAVENOUS | Status: DC
  Administered 2015-12-09 – 2015-12-11 (×7): 2 g via INTRAVENOUS
  Filled 2015-12-09 (×8): qty 50

## 2015-12-09 MED ORDER — BACLOFEN 10 MG PO TABS
5.0000 mg | ORAL_TABLET | Freq: Two times a day (BID) | ORAL | Status: DC
  Administered 2015-12-09 – 2015-12-21 (×20): 5 mg via ORAL
  Filled 2015-12-09 (×23): qty 1

## 2015-12-09 MED ORDER — PANTOPRAZOLE SODIUM 40 MG PO TBEC
40.0000 mg | DELAYED_RELEASE_TABLET | Freq: Every day | ORAL | Status: DC
  Administered 2015-12-09 – 2015-12-12 (×3): 40 mg via ORAL
  Filled 2015-12-09 (×4): qty 1

## 2015-12-09 NOTE — Transfer of Care (Signed)
Immediate Anesthesia Transfer of Care Note  Patient: [name]  Procedure(s) Performed: Procedure(s): IRRIGATION AND DEBRIDEMENT BILATERAL TIBIA/FIBULA (Bilateral) EXTERNAL FIXATION BILATERAL TIBIA/FIBULA (Bilateral) APPLICATION OF WOUND VAC BILATERAL LEGS  (Bilateral)  Patient Location: PACU  Anesthesia Type:General  Level of Consciousness: sedated, patient cooperative and responds to stimulation  Airway & Oxygen Therapy: Patient Spontanous Breathing  Post-op Assessment: Report given to RN and Post -op Vital signs reviewed and unstable, Anesthesiologist notified  Post vital signs: Reviewed and stable  Last Vitals:  Filed Vitals:   12/08/15 2150 12/09/15 0123  BP: 94/70   Pulse: 81   Temp:  36.9 C  Resp:      Complications: No apparent anesthesia complications
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
## 2015-12-09 NOTE — ED Notes (Signed)
All belongings including Wallet, lighter, pack of cigarettes, and wheelchair conveyed to PACU personnel.

## 2015-12-09 NOTE — Progress Notes (Signed)
Paged Dr. Janee Morn.  Dr Janee Morn is aware of the pts low BP and says that he has reviewed clinic notes from the pts past and believes he lives in the 80's systolic.  As long as pts mentation is appropriate he doesn't want to do anything further.  Will be taking pt to room in 2H.
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
## 2015-12-09 NOTE — Progress Notes (Signed)
Anesthesia MD notified BP continues to be low after infusing 5% albumin, ranging from 54/31 (37) to 74/51 (58).  Pt is going to 2H and MD wants to go ahead and send pt on.  Will continue to monitor and notify for further changes.

## 2015-12-09 NOTE — Anesthesia Postprocedure Evaluation (Signed)
Anesthesia Post Note  Patient: [name]  Procedure(s) Performed: Procedure(s) (LRB): IRRIGATION AND DEBRIDEMENT BILATERAL TIBIA/FIBULA (Bilateral) EXTERNAL FIXATION BILATERAL TIBIA/FIBULA (Bilateral) APPLICATION OF WOUND VAC BILATERAL LEGS  (Bilateral)  Patient location during evaluation: PACU Anesthesia Type: General Level of consciousness: awake and alert Pain management: pain level controlled Vital Signs Assessment: post-procedure vital signs reviewed and stable Respiratory status: spontaneous breathing, nonlabored ventilation, respiratory function stable and patient connected to nasal cannula oxygen Cardiovascular status: blood pressure returned to baseline and stable Postop Assessment: no signs of nausea or vomiting Anesthetic complications: no    Last Vitals:  Filed Vitals:   12/09/15 0200 12/09/15 0215  BP: 60/44 74/51  Pulse: 91 81  Temp:    Resp: 13 15    Last Pain: There were no vitals filed for this visit.               Guelda Batson DAVID
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
## 2015-12-10 MED ORDER — BISACODYL 10 MG RE SUPP
10.0000 mg | Freq: Every day | RECTAL | Status: DC | PRN
  Filled 2015-12-10: qty 1

## 2015-12-10 MED ORDER — DOCUSATE SODIUM 100 MG PO CAPS
100.0000 mg | ORAL_CAPSULE | Freq: Two times a day (BID) | ORAL | Status: DC
  Administered 2015-12-10 – 2015-12-23 (×20): 100 mg via ORAL
  Filled 2015-12-10 (×24): qty 1

## 2015-12-10 MED ORDER — ENOXAPARIN SODIUM 40 MG/0.4ML ~~LOC~~ SOLN
40.0000 mg | Freq: Every day | SUBCUTANEOUS | Status: DC
  Administered 2015-12-10: 40 mg via SUBCUTANEOUS
  Filled 2015-12-10: qty 0.4
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
## 2015-12-10 NOTE — H&P (Signed)
H&P update  The surgical history has been reviewed and remains accurate without interval change.  The patient was re-examined and patient's physiologic condition has not changed significantly in the last 30 days. The condition still exists that makes this procedure necessary. The treatment plan remains the same, without new options for care.  No new pharmacological allergies or types of therapy has been initiated that would change the plan or the appropriateness of the plan.  The patient and/or family understand the potential benefits and risks.  Mayra Reel, MD 12/10/2015 10:24 PM
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
## 2015-12-10 NOTE — Progress Notes (Signed)
PT Cancellation Note  Patient Details Name: [name] MRN: [id number] DOB: 11-01-1970   Cancelled Treatment:    Reason Eval/Treat Not Completed: Other (comment) (In am, pt asked PT to come in pm. Refused in pm as well.) Pt states he will try and get up tomorrow.  Thanks.    [place], [place] F 12/10/2015, 3:09 PM [employer] [phone] (pager)

## 2015-12-11 ENCOUNTER — Inpatient Hospital Stay (HOSPITAL_COMMUNITY): Payer: Medicare Other | Admitting: Anesthesiology

## 2015-12-11 ENCOUNTER — Encounter (HOSPITAL_COMMUNITY): Admission: EM | Disposition: A | Discharge status: Rehabilitation Facility | Payer: Self-pay | Source: Home / Self Care

## 2015-12-11 ENCOUNTER — Encounter (HOSPITAL_COMMUNITY): Payer: Self-pay | Admitting: Anesthesiology

## 2015-12-11 DIAGNOSIS — Z9289 Personal history of other medical treatment: Secondary | ICD-10-CM

## 2015-12-11 HISTORY — DX: Personal history of other medical treatment: Z92.89

## 2015-12-11 HISTORY — PX: I & D EXTREMITY: SHX5045

## 2015-12-11 LAB — CBC
HCT: 20 % — ABNORMAL LOW (ref 39.0–52.0)
Hemoglobin: 6.9 g/dL — CL (ref 13.0–17.0)
MCH: 31.7 pg (ref 26.0–34.0)
MCHC: 34.5 g/dL (ref 30.0–36.0)
MCV: 91.7 fL (ref 78.0–100.0)
Platelets: 106 10*3/uL — ABNORMAL LOW (ref 150–400)
RBC: 2.18 MIL/uL — ABNORMAL LOW (ref 4.22–5.81)
RDW: 13.6 % (ref 11.5–15.5)
WBC: 9.2 10*3/uL (ref 4.0–10.5)

## 2015-12-11 LAB — PREPARE RBC (CROSSMATCH)

## 2015-12-11 SURGERY — IRRIGATION AND DEBRIDEMENT EXTREMITY
Anesthesia: General | Site: Leg Lower | Laterality: Left

## 2015-12-11 MED ORDER — SODIUM CHLORIDE 0.9 % IV SOLN: 10.0000 mL/h | Freq: Once | INTRAVENOUS | Status: DC

## 2015-12-11 MED ORDER — PROMETHAZINE HCL 25 MG/ML IJ SOLN
6.2500 mg | INTRAMUSCULAR | Status: DC | PRN
Start: 2015-12-11 — End: 2015-12-11

## 2015-12-11 MED ORDER — CEFAZOLIN SODIUM-DEXTROSE 2-3 GM-% IV SOLR
2.0000 g | Freq: Three times a day (TID) | INTRAVENOUS | Status: DC
  Administered 2015-12-11 – 2015-12-20 (×25): 2 g via INTRAVENOUS
  Filled 2015-12-11 (×31): qty 50

## 2015-12-11 MED ORDER — CEFAZOLIN SODIUM-DEXTROSE 2-3 GM-% IV SOLR
INTRAVENOUS | Status: AC
  Filled 2015-12-11: qty 50

## 2015-12-11 MED ORDER — SODIUM CHLORIDE 0.9 % IV SOLN
10.0000 mg | INTRAVENOUS | Status: DC | PRN
  Administered 2015-12-11: 20 ug/min via INTRAVENOUS

## 2015-12-11 MED ORDER — SODIUM CHLORIDE 0.9 % IV SOLN
Freq: Once | INTRAVENOUS | Status: AC
  Administered 2015-12-11: 10:00:00 via INTRAVENOUS

## 2015-12-11 MED ORDER — LACTATED RINGERS IV SOLN
INTRAVENOUS | Status: DC | PRN
  Administered 2015-12-11: 09:00:00 via INTRAVENOUS

## 2015-12-11 MED ORDER — PROPOFOL 10 MG/ML IV BOLUS
INTRAVENOUS | Status: AC
  Filled 2015-12-11: qty 20

## 2015-12-11 MED ORDER — PROPOFOL 10 MG/ML IV BOLUS
INTRAVENOUS | Status: DC | PRN
  Administered 2015-12-11: 200 mg via INTRAVENOUS

## 2015-12-11 MED ORDER — SODIUM CHLORIDE 0.9 % IR SOLN
Status: DC | PRN
  Administered 2015-12-11: 3000 mL

## 2015-12-11 MED ORDER — 0.9 % SODIUM CHLORIDE (POUR BTL) OPTIME
TOPICAL | Status: DC | PRN
  Administered 2015-12-11: 1000 mL

## 2015-12-11 MED ORDER — ONDANSETRON HCL 4 MG/2ML IJ SOLN
INTRAMUSCULAR | Status: DC | PRN
  Administered 2015-12-11: 4 mg via INTRAVENOUS

## 2015-12-11 MED ORDER — MIDAZOLAM HCL 2 MG/2ML IJ SOLN
INTRAMUSCULAR | Status: AC
  Filled 2015-12-11: qty 2

## 2015-12-11 MED ORDER — HYDROMORPHONE HCL 1 MG/ML IJ SOLN: 0.2500 mg | INTRAMUSCULAR | Status: DC | PRN

## 2015-12-11 MED ORDER — FENTANYL CITRATE (PF) 250 MCG/5ML IJ SOLN
INTRAMUSCULAR | Status: AC
  Filled 2015-12-11: qty 5

## 2015-12-11 MED ORDER — MIDAZOLAM HCL 5 MG/5ML IJ SOLN
INTRAMUSCULAR | Status: DC | PRN
  Administered 2015-12-11: 2 mg via INTRAVENOUS

## 2015-12-11 MED ORDER — FENTANYL CITRATE (PF) 100 MCG/2ML IJ SOLN
INTRAMUSCULAR | Status: DC | PRN
  Administered 2015-12-11 (×5): 50 ug via INTRAVENOUS

## 2015-12-11 SURGICAL SUPPLY — 62 items
BANDAGE ELASTIC 3 VELCRO ST LF (GAUZE/BANDAGES/DRESSINGS) IMPLANT
BLADE SURG 10 STRL SS (BLADE) ×3 IMPLANT
BNDG COHESIVE 1X5 TAN STRL LF (GAUZE/BANDAGES/DRESSINGS) IMPLANT
BNDG COHESIVE 4X5 TAN STRL (GAUZE/BANDAGES/DRESSINGS) ×3 IMPLANT
BNDG COHESIVE 6X5 TAN STRL LF (GAUZE/BANDAGES/DRESSINGS) ×6 IMPLANT
BNDG CONFORM 3 STRL LF (GAUZE/BANDAGES/DRESSINGS) IMPLANT
BNDG GAUZE ELAST 4 BULKY (GAUZE/BANDAGES/DRESSINGS) ×3 IMPLANT
BNDG GAUZE STRTCH 6 (GAUZE/BANDAGES/DRESSINGS) ×9 IMPLANT
CANISTER WOUND CARE 500ML ATS (WOUND CARE) ×2 IMPLANT
CORDS BIPOLAR (ELECTRODE) IMPLANT
COVER SURGICAL LIGHT HANDLE (MISCELLANEOUS) ×3 IMPLANT
CUFF TOURNIQUET SINGLE 24IN (TOURNIQUET CUFF) IMPLANT
CUFF TOURNIQUET SINGLE 34IN LL (TOURNIQUET CUFF) ×6 IMPLANT
CUFF TOURNIQUET SINGLE 44IN (TOURNIQUET CUFF) IMPLANT
DRAPE EXTREMITY BILATERAL (DRAPES) IMPLANT
DRAPE IMP U-DRAPE 54X76 (DRAPES) IMPLANT
DRAPE INCISE IOBAN 66X45 STRL (DRAPES) ×12 IMPLANT
DRAPE STERI IOBAN 125X83 (DRAPES) ×2 IMPLANT
DRAPE SURG 17X23 STRL (DRAPES) IMPLANT
DRAPE U-SHAPE 47X51 STRL (DRAPES) ×3 IMPLANT
DRSG VAC ATS SM SENSATRAC (GAUZE/BANDAGES/DRESSINGS) ×2 IMPLANT
DURAPREP 26ML APPLICATOR (WOUND CARE) ×3 IMPLANT
ELECT CAUTERY BLADE 6.4 (BLADE) ×3 IMPLANT
ELECT REM PT RETURN 9FT ADLT (ELECTROSURGICAL)
ELECTRODE REM PT RTRN 9FT ADLT (ELECTROSURGICAL) IMPLANT
FACESHIELD WRAPAROUND (MASK) IMPLANT
GAUZE SPONGE 4X4 12PLY STRL (GAUZE/BANDAGES/DRESSINGS) ×3 IMPLANT
GAUZE XEROFORM 1X8 LF (GAUZE/BANDAGES/DRESSINGS) ×3 IMPLANT
GAUZE XEROFORM 5X9 LF (GAUZE/BANDAGES/DRESSINGS) ×3 IMPLANT
GLOVE SKINSENSE NS SZ7.5 (GLOVE) ×4
GLOVE SKINSENSE STRL SZ7.5 (GLOVE) ×2 IMPLANT
GOWN STRL REIN XL XLG (GOWN DISPOSABLE) ×6 IMPLANT
HANDPIECE INTERPULSE COAX TIP (DISPOSABLE)
KIT BASIN OR (CUSTOM PROCEDURE TRAY) ×3 IMPLANT
KIT ROOM TURNOVER OR (KITS) ×3 IMPLANT
MANIFOLD NEPTUNE II (INSTRUMENTS) ×3 IMPLANT
NS IRRIG 1000ML POUR BTL (IV SOLUTION) ×6 IMPLANT
PACK ORTHO EXTREMITY (CUSTOM PROCEDURE TRAY) ×3 IMPLANT
PAD ABD 8X10 STRL (GAUZE/BANDAGES/DRESSINGS) ×3 IMPLANT
PAD ARMBOARD 7.5X6 YLW CONV (MISCELLANEOUS) ×6 IMPLANT
PADDING CAST ABS 4INX4YD NS (CAST SUPPLIES) ×4
PADDING CAST ABS COTTON 4X4 ST (CAST SUPPLIES) ×2 IMPLANT
PADDING CAST COTTON 6X4 STRL (CAST SUPPLIES) ×3 IMPLANT
SET HNDPC FAN SPRY TIP SCT (DISPOSABLE) IMPLANT
SPONGE LAP 18X18 X RAY DECT (DISPOSABLE) ×6 IMPLANT
STOCKINETTE IMPERVIOUS 9X36 MD (GAUZE/BANDAGES/DRESSINGS) ×3 IMPLANT
SUT ETHILON 2 0 FS 18 (SUTURE) ×9 IMPLANT
SUT ETHILON 2 0 PSLX (SUTURE) ×3 IMPLANT
SUT ETHILON 3 0 PS 1 (SUTURE) ×6 IMPLANT
SUT VIC AB 2-0 CT1 36 (SUTURE) ×3 IMPLANT
SUT VIC AB 2-0 FS1 27 (SUTURE) ×6 IMPLANT
SYR CONTROL 10ML LL (SYRINGE) IMPLANT
TOWEL OR 17X24 6PK STRL BLUE (TOWEL DISPOSABLE) ×3 IMPLANT
TOWEL OR 17X26 10 PK STRL BLUE (TOWEL DISPOSABLE) ×3 IMPLANT
TUBE ANAEROBIC SPECIMEN COL (MISCELLANEOUS) IMPLANT
TUBE CONNECTING 12'X1/4 (SUCTIONS) ×1
TUBE CONNECTING 12X1/4 (SUCTIONS) ×2 IMPLANT
TUBE FEEDING 5FR 15 INCH (TUBING) IMPLANT
TUBING CYSTO DISP (UROLOGICAL SUPPLIES) ×3 IMPLANT
UNDERPAD 30X30 INCONTINENT (UNDERPADS AND DIAPERS) ×6 IMPLANT
WATER STERILE IRR 1000ML POUR (IV SOLUTION) ×3 IMPLANT
YANKAUER SUCT BULB TIP NO VENT (SUCTIONS) ×3 IMPLANT

## 2015-12-11 NOTE — Progress Notes (Signed)
PT Cancellation Note  Patient Details Name: [name] MRN: [id number] DOB: Dec 28, 1970   Cancelled Treatment:    Reason Eval/Treat Not Completed: Patient at procedure or test/unavailable   Currently in PACU after another I&D and wound VAC placement;   Will follow up tomorrow;   Thanks,  [name], PT  Acute Rehabilitation Services Pager [phone] Office [phone]    [name] [facility] 12/11/2015, 11:14 AM

## 2015-12-11 NOTE — Anesthesia Postprocedure Evaluation (Signed)
Anesthesia Post Note  Patient: [name]  Procedure(s) Performed: Procedure(s) (LRB): IRRIGATION AND DEBRIDEMENT LEG (Left)  Patient location during evaluation: PACU Anesthesia Type: General Level of consciousness: sedated Pain management: pain level controlled Vital Signs Assessment: post-procedure vital signs reviewed and stable Respiratory status: spontaneous breathing and respiratory function stable Cardiovascular status: stable Anesthetic complications: no    Last Vitals:  Filed Vitals:   12/11/15 1120 12/11/15 1149  BP: 117/88 116/75  Pulse: 78 88  Temp: 36.6 C 36.6 C  Resp: 19 18    Last Pain:  Filed Vitals:   12/11/15 1156  PainSc: 2                  Kerrick Miler DANIEL

## 2015-12-11 NOTE — Anesthesia Procedure Notes (Signed)
Procedure Name: LMA Insertion Date/Time: 12/11/2015 9:54 AM Performed by: Wray Kearns A Pre-anesthesia Checklist: Patient identified, Emergency Drugs available, Suction available, Patient being monitored and Timeout performed Patient Re-evaluated:Patient Re-evaluated prior to inductionOxygen Delivery Method: Circle system utilized Preoxygenation: Pre-oxygenation with 100% oxygen Intubation Type: IV induction Ventilation: Mask ventilation without difficulty LMA: LMA inserted LMA Size: 4.0 Tube type: Oral Number of attempts: 1 Placement Confirmation: positive ETCO2 and breath sounds checked- equal and bilateral Tube secured with: Tape Dental Injury: Teeth and Oropharynx as per pre-operative assessment
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
## 2015-12-11 NOTE — Transfer of Care (Signed)
Immediate Anesthesia Transfer of Care Note 2 Patient: [name]  Procedure(s) Performed: Procedure(s): IRRIGATION AND DEBRIDEMENT LEG (Left)  Patient Location: PACU  Anesthesia Type:General  Level of Consciousness: oriented, sedated, patient cooperative and responds to stimulation  Airway & Oxygen Therapy: Patient Spontanous Breathing and Patient connected to nasal cannula oxygen  Post-op Assessment: Report given to RN, Post -op Vital signs reviewed and stable, Patient moving all extremities and Patient moving all extremities X 4  Post vital signs: Reviewed and stable  Last Vitals:  Filed Vitals:   12/10/15 2041 12/10/15 2349  BP: 109/52   Pulse: 85   Temp: 38 C 37.8 C  Resp: 19     Complications: No apparent anesthesia complications
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
## 2015-12-11 NOTE — Anesthesia Preprocedure Evaluation (Addendum)
Anesthesia Evaluation  Patient identified by MRN, date of birth, ID band Patient awake    Reviewed: Allergy & Precautions, NPO status , Patient's Chart, lab work & pertinent test results  History of Anesthesia Complications Negative for: history of anesthetic complications  Airway Mallampati: I  TM Distance: >3 FB Neck ROM: Full    Dental no notable dental hx. (+) Dental Advisory Given   Pulmonary Current Smoker,    Pulmonary exam normal        Cardiovascular negative cardio ROS Normal cardiovascular exam     Neuro/Psych Seizures -,  C5 partial Quadraplegia negative psych ROS   GI/Hepatic negative GI ROS, Neg liver ROS,   Endo/Other  negative endocrine ROS  Renal/GU negative Renal ROS     Musculoskeletal   Abdominal   Peds  Hematology   Anesthesia Other Findings   Reproductive/Obstetrics                            Anesthesia Physical  Anesthesia Plan  ASA: III  Anesthesia Plan: General   Post-op Pain Management:    Induction: Intravenous  Airway Management Planned: Oral ETT  Additional Equipment:   Intra-op Plan:   Post-operative Plan: Extubation in OR  Informed Consent: I have reviewed the patients History and Physical, chart, labs and discussed the procedure including the risks, benefits and alternatives for the proposed anesthesia with the patient or authorized representative who has indicated his/her understanding and acceptance.   Dental advisory given  Plan Discussed with: CRNA and Surgeon  Anesthesia Plan Comments:        Anesthesia Quick Evaluation

## 2015-12-12 ENCOUNTER — Encounter (HOSPITAL_COMMUNITY): Payer: Self-pay | Admitting: Orthopaedic Surgery

## 2015-12-12 LAB — TYPE AND SCREEN
ABO/RH(D): O NEG
Antibody Screen: NEGATIVE
Unit division: 0
Unit division: 0
Unit division: 0
Unit division: 0
Unit division: 0
Unit division: 0

## 2015-12-12 LAB — CBC
HCT: 20 % — ABNORMAL LOW (ref 39.0–52.0)
HCT: 24 % — ABNORMAL LOW (ref 39.0–52.0)
Hemoglobin: 6.8 g/dL — CL (ref 13.0–17.0)
Hemoglobin: 8.3 g/dL — ABNORMAL LOW (ref 13.0–17.0)
MCH: 30.8 pg (ref 26.0–34.0)
MCH: 30.9 pg (ref 26.0–34.0)
MCHC: 34 g/dL (ref 30.0–36.0)
MCHC: 34.6 g/dL (ref 30.0–36.0)
MCV: 90.5 fL (ref 78.0–100.0)
MCV: 89.2 fL (ref 78.0–100.0)
Platelets: 112 10*3/uL — ABNORMAL LOW (ref 150–400)
Platelets: 120 10*3/uL — ABNORMAL LOW (ref 150–400)
RBC: 2.21 MIL/uL — ABNORMAL LOW (ref 4.22–5.81)
RBC: 2.69 MIL/uL — ABNORMAL LOW (ref 4.22–5.81)
RDW: 13.4 % (ref 11.5–15.5)
RDW: 14.8 % (ref 11.5–15.5)
WBC: 9.1 10*3/uL (ref 4.0–10.5)
WBC: 9.8 10*3/uL (ref 4.0–10.5)

## 2015-12-12 LAB — BASIC METABOLIC PANEL
Anion gap: 8 (ref 5–15)
BUN: 10 mg/dL (ref 6–20)
CO2: 28 mmol/L (ref 22–32)
Calcium: 8.1 mg/dL — ABNORMAL LOW (ref 8.9–10.3)
Chloride: 106 mmol/L (ref 101–111)
Creatinine, Ser: 0.74 mg/dL (ref 0.61–1.24)
GFR calc Af Amer: >60 mL/min (ref >60)
GFR calc non Af Amer: >60 mL/min (ref >60)
Glucose, Bld: 96 mg/dL (ref 65–99)
Potassium: 3.7 mmol/L (ref 3.5–5.1)
Sodium: 142 mmol/L (ref 135–145)

## 2015-12-12 MED ORDER — ENOXAPARIN SODIUM 40 MG/0.4ML ~~LOC~~ SOLN
40.0000 mg | SUBCUTANEOUS | Status: DC
  Administered 2015-12-12 – 2015-12-18 (×6): 40 mg via SUBCUTANEOUS
  Filled 2015-12-12 (×7): qty 0.4

## 2015-12-12 NOTE — Progress Notes (Signed)
1 Day Post-Op  Subjective: No c/o. Min to no pain. Reports some improved sensation in L foot. Having BMs. Eating well  Objective: Vital signs in last 24 hours: Temp:  [97.8 F (36.6 C)-100.9 F (38.3 C)] 98.5 F (36.9 C) (02/20 0650) Pulse Rate:  [78-106] 88 (02/20 0650) Resp:  [17-19] 17 (02/20 0650) BP: (99-134)/(58-90) 134/78 mmHg (02/20 0650) SpO2:  [98 %-100 %] 100 % (02/20 0650) Last BM Date: 12/10/15  Intake/Output from previous day: 02/19 0701 - 02/20 0700 In: 3966.8 [P.O.:1080; I.V.:2120.8; Blood:666; IV Piggyback:100] Out: 3025 [Urine:2925; Drains:50; Blood:50] Intake/Output this shift:    Alert, nontoxic cta b/l Reg Soft, nt B/l LE ext fix. Significant L foot swelling. Involuntary twitching to b/l feet when touched. +b/l biphasic dp/pt doppler signals.   Lab Results:   Recent Labs  12/11/15 0850 12/12/15 0556  WBC 9.1 9.8  HGB 6.8* 8.3*  HCT 20.0* 24.0*  PLT 112* 120*   BMET  Recent Labs  12/12/15 0556  NA 142  K 3.7  CL 106  CO2 28  GLUCOSE 96  BUN 10  CREATININE 0.74  CALCIUM 8.1*   PT/INR No results for input(s): LABPROT, INR in the last 72 hours. ABG No results for input(s): PHART, HCO3 in the last 72 hours.  Invalid input(s): PCO2, PO2  Studies/Results: No results found.  Anti-infectives: Anti-infectives    Start     Dose/Rate Route Frequency Ordered Stop   12/11/15 1800  ceFAZolin (ANCEF) IVPB 2 g/50 mL premix    Comments:  Anesthesia to give preop   2 g 100 mL/hr over 30 Minutes Intravenous Every 8 hours 12/11/15 1235     12/09/15 1030  ceFAZolin (ANCEF) IVPB 2 g/50 mL premix  Status:  Discontinued    Comments:  Anesthesia to give preop   2 g 100 mL/hr over 30 Minutes Intravenous 3 times per day 12/09/15 1001 12/11/15 1235   12/08/15 2215  ceFAZolin (ANCEF) IVPB 2 g/50 mL premix    Comments:  Anesthesia to give preop   2 g 100 mL/hr over 30 Minutes Intravenous  Once 12/08/15 2203 12/08/15 2305   12/08/15 2000  ceFAZolin  (ANCEF) IVPB 1 g/50 mL premix     1 g 100 mL/hr over 30 Minutes Intravenous  Once 12/08/15 1956 12/08/15 2117      Assessment/Plan: s/p Procedure(s): IRRIGATION AND DEBRIDEMENT LEG (Left) Ortho managing bilateral lower extremity ex-fix devices and VAC. Await plans for further surgery, multistage repair versus BKA. Will ask ortho to evaluate L foot Regular diet Voiding with condom catheter resume Lovenox for DVT prophylaxis On Colace and dulcolax prn.  Anemia. Hemoglobin 6.8 yesterday, 2u transfusion. 8.3 this am. Repeat cbc in am since restarting lovenox  Mary Sella. Andrey Campanile, MD, FACS General, Bariatric, & Minimally Invasive Surgery [facility], [place]     LOS: 4 days    [name] 12/12/2015

## 2015-12-12 NOTE — Progress Notes (Signed)
Orthopedic Tech Progress Note Patient Details:  [name] 02/29/1971 [id number]  Patient ID: [name], male   DOB: 01-Jul-1971, 45 y.o.   MRN: [id number] Two Kuzma slings were sent to OT on 6 north room 8 for other reasons.  Tawni Carnes [facility] 12/12/2015, 11:51 AM
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
## 2015-12-12 NOTE — Progress Notes (Signed)
PT Cancellation Note  Patient Details Name: Yecheskel Kurek MRN: [id number] DOB: 07/13/1971   Cancelled Treatment:    Reason Eval/Treat Not Completed: Patient at procedure or test/unavailable (pt waiting on dressing change from nursing and beginning splinting with OT. Will attempt later today as time allows)   Toney Sang [facility] 12/12/2015, 10:15 AM Delaney Meigs, PT [phone]

## 2015-12-12 NOTE — Progress Notes (Signed)
OT Note:  Bil. Foot plate splints checked.  Rt splint had migrated proximally.  Splints removed, skin checked.  No evidence of redness or pressure - no issues noted. Marland Kitchen   Splints reapplied.    Jeani Hawking, OTR/L [phone]

## 2015-12-12 NOTE — Progress Notes (Signed)
OT Treatment NOTE  OT assessment for possible splinting needs for bil LE. Pt currently with edema present in bil LE. Of note: BIL LE spasms but with sustained positioning spasms stop. Pt with R LE with medial inner heel blister present and L LE with lateral dorsal aspect of foot with blister. Pt with drainage from bil LE pin locations active.  OT contacting Trauma PA for orders of Kuzma sling x2 to be modified further for bil LE resting foot splints to prevent pressure wounds. Splints ordered and pending arrival to room for OT to further fabricate bil LE resting foot splints.  Pt tolerating bil LE in a neutral anatomical position supine at this time. Family ( mother and brother) arriving to room and informed OT staff that patient does have some memory deficits and hx of seizures. Pt previously reported to therapist that his L LE in always positioned with knee internally rotated but with further questions and cues able to align in neutral.    Mateo Flow   OTR/L Pager: [phone] Office: [phone] . Time 10:58-11:15 Ortho fit charge

## 2015-12-12 NOTE — Care Management Important Message (Signed)
Important Message  Patient Details  Name: [name] MRN: [id number] Date of Birth: Mar 20, 1971   Medicare Important Message Given:  Yes    Bernadette Hoit 12/12/2015, 11:38 AM
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
## 2015-12-12 NOTE — Progress Notes (Signed)
Splint Wear and Care   Patient Name: __[name]______________________ Date: ___2/20/217_________  General splint information:  - Wear your splint(s) on your_BIL FEET_______________________________.   -  If you notice/experience increased pain, swelling, or redness (from the splint(s)) that doesn't go away after 15 minutes of removing the splint(s), take the splint off and notify your therapist.    - The first 24-48 hours, remove the splint every 2-4 hours and monitor your skin for signs of redness, or swelling.  - Keep the splint(s) away from heat sources like a furnace, stove, and a car on a hot sunny day.  The splints are made from thermoplastic materials and if heated up will lose their shape and no longer fit appropriately.  -Splint(s) can be cleaned with warm soapy water or alcohol swabs  (, _3__X /week)  Wearing Schedule:  - Wear your splint(s)  __all_____ hours during the day.  - Wear your splint(s)  __all_____hours during the night   .  If you have questions or concerns: contact ___Brynn/ Wendi_________________, occupational therapy  Acute Rehab Department [phone]

## 2015-12-12 NOTE — Progress Notes (Signed)
OT NOTE  Bil foot splints removed and skin checked. Pt currently without any pressure points and tolerating splint well. Pt is up in chair currently. Pt with towels and pads between bil LE in chair due to spasms causing external fixators to touch and incr risk for skin break down.  Sign placed above HOB with splint care information that is currently in patients chart.   Mateo Flow   OTR/L Pager: [phone] Office: [phone] .
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
## 2015-12-13 DIAGNOSIS — L8990 Pressure ulcer of unspecified site, unspecified stage: Secondary | ICD-10-CM

## 2015-12-13 DIAGNOSIS — D62 Acute posthemorrhagic anemia: Secondary | ICD-10-CM | POA: Diagnosis not present

## 2015-12-13 LAB — BASIC METABOLIC PANEL
Anion gap: 11 (ref 5–15)
BUN: 11 mg/dL (ref 6–20)
CO2: 26 mmol/L (ref 22–32)
Calcium: 8.1 mg/dL — ABNORMAL LOW (ref 8.9–10.3)
Chloride: 103 mmol/L (ref 101–111)
Creatinine, Ser: 0.71 mg/dL (ref 0.61–1.24)
GFR calc Af Amer: >60 mL/min (ref >60)
GFR calc non Af Amer: >60 mL/min (ref >60)
Glucose, Bld: 89 mg/dL (ref 65–99)
Potassium: 3.2 mmol/L — ABNORMAL LOW (ref 3.5–5.1)
Sodium: 140 mmol/L (ref 135–145)

## 2015-12-13 LAB — URINE MICROSCOPIC-ADD ON

## 2015-12-13 LAB — URINALYSIS, ROUTINE W REFLEX MICROSCOPIC
Bilirubin Urine: NEGATIVE
Glucose, UA: NEGATIVE mg/dL
Hgb urine dipstick: NEGATIVE
Ketones, ur: 15 mg/dL — AB
Nitrite: NEGATIVE
Protein, ur: NEGATIVE mg/dL
Specific Gravity, Urine: 1.018 (ref 1.005–1.030)
pH: 6.5 (ref 5.0–8.0)

## 2015-12-13 LAB — CBC
HCT: 23.5 % — ABNORMAL LOW (ref 39.0–52.0)
Hemoglobin: 8 g/dL — ABNORMAL LOW (ref 13.0–17.0)
MCH: 30 pg (ref 26.0–34.0)
MCHC: 34 g/dL (ref 30.0–36.0)
MCV: 88 fL (ref 78.0–100.0)
Platelets: 175 10*3/uL (ref 150–400)
RBC: 2.67 MIL/uL — ABNORMAL LOW (ref 4.22–5.81)
RDW: 14.4 % (ref 11.5–15.5)
WBC: 8.2 10*3/uL (ref 4.0–10.5)

## 2015-12-13 MED ORDER — LIDOCAINE HCL (CARDIAC) 20 MG/ML IV SOLN
INTRAVENOUS | Status: AC
  Filled 2015-12-13: qty 5

## 2015-12-13 MED ORDER — PROPOFOL 10 MG/ML IV BOLUS
INTRAVENOUS | Status: AC
  Filled 2015-12-13: qty 20

## 2015-12-13 MED ORDER — TRAMADOL HCL 50 MG PO TABS
50.0000 mg | ORAL_TABLET | Freq: Four times a day (QID) | ORAL | Status: DC | PRN
  Filled 2015-12-13: qty 2

## 2015-12-13 MED ORDER — ACETAMINOPHEN 325 MG PO TABS
650.0000 mg | ORAL_TABLET | Freq: Four times a day (QID) | ORAL | Status: DC | PRN
  Administered 2015-12-13 – 2015-12-15 (×3): 650 mg via ORAL
  Filled 2015-12-13 (×4): qty 2

## 2015-12-13 MED ORDER — MIDAZOLAM HCL 2 MG/2ML IJ SOLN
INTRAMUSCULAR | Status: AC
  Filled 2015-12-13: qty 2

## 2015-12-13 MED ORDER — FENTANYL CITRATE (PF) 250 MCG/5ML IJ SOLN
INTRAMUSCULAR | Status: AC
  Filled 2015-12-13: qty 5

## 2015-12-13 MED ORDER — HYDROMORPHONE HCL 1 MG/ML IJ SOLN: 0.5000 mg | INTRAMUSCULAR | Status: DC | PRN

## 2015-12-13 MED ORDER — GLYCOPYRROLATE 0.2 MG/ML IJ SOLN
INTRAMUSCULAR | Status: AC
  Filled 2015-12-13: qty 1

## 2015-12-13 MED ORDER — ARTIFICIAL TEARS OP OINT
TOPICAL_OINTMENT | OPHTHALMIC | Status: AC
  Filled 2015-12-13: qty 3.5

## 2015-12-13 MED ORDER — ONDANSETRON HCL 4 MG/2ML IJ SOLN
INTRAMUSCULAR | Status: AC
  Filled 2015-12-13: qty 2
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
## 2015-12-13 NOTE — Progress Notes (Signed)
Dr. Dwain Sarna notified temp 101.1. Orders received

## 2015-12-13 NOTE — Progress Notes (Signed)
OT NOTE  Bil foot splints removed and skin checked. Pt currently without any pressure points and tolerating splint well. Pt provided basin with water for UB bathing and oral care items. Pt currently bathing with HOB elevated. . Pt with towels and pads between bil LE due to spasms causing external fixators to touch and incr risk for skin break down. Pt noted to have incr spasms present this AM compared to time with patient 10am-13:30P on 12/12/15.   Mateo Flow   OTR/L Pager: [phone] Office: [phone] .
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
## 2015-12-14 ENCOUNTER — Inpatient Hospital Stay (HOSPITAL_COMMUNITY): Payer: Medicare Other
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
## 2015-12-14 NOTE — Progress Notes (Signed)
OT NOTE  Bil foot splints removed and skin checked. Pt currently without any pressure points and tolerating splint well. Pt with towels and pads between bil LE due to spasms causing external fixators to touch and incr risk for skin break down. Pt elevated on pillows to prevent heel break down.    Mateo Flow   OTR/L Pager: [phone] Office: [phone] .

## 2015-12-14 NOTE — Progress Notes (Signed)
OT NOTE OT NOTE  Bil foot splints removed and skin checked. Pt currently without any pressure points and tolerating splint well. Pt with towels and pads between bil LE due to spasms causing external fixators to touch and incr risk for skin break down. Pt elevated on pillows to prevent heel break down.  Pt declined OOB at this time. Pt states "i dont like that chair! What's the point? I can't go no where" pt reports "I want in my wheelchair so I can go". Pt educated that mobility in w/c is not possible at this time due to pressure on foot plates in w/c and will require a flat surface for bil LE like the recliner provides.   Possible need for w/c with foot board (bridge between elevated leg rest) but mobility in w/c would still be limited and restricted due to wound vac. Pt with cognitive deficits and lacks insight to current injury affect on mobility. Concern with patients emotional state from d/c independence and mobility.     [name]   OTR/L Pager: [phone] Office: [phone] .

## 2015-12-14 NOTE — Progress Notes (Signed)
PT Cancellation Note  Patient Details Name: [name] MRN: [id number] DOB: 09-15-1971   Cancelled Treatment:    Reason Eval/Treat Not Completed: Patient declined, no reason specified, checked back on pt in the PM and he declined PT adamantly.    Inaya Gillham, [place] 12/14/2015, 1:52 PM

## 2015-12-14 NOTE — Progress Notes (Signed)
Pt refused to get a bath and clean pins this morning.

## 2015-12-14 NOTE — Progress Notes (Signed)
PT Cancellation Note  Patient Details Name: [name] MRN: [id number] DOB: [birth year]   Cancelled Treatment:    Reason Eval/Treat Not Completed: Other (comment) pt reports that he has already gone through his bowel regimen this morning and that is exhausting and he just wants to relax right now. Bilateral LE's repositioned on pillows before leaving to prevent skin breakdown. Will check back as time allows.    Elbia Paro, [place] 12/14/2015, 10:09 AM

## 2015-12-15 ENCOUNTER — Encounter (HOSPITAL_COMMUNITY): Payer: Self-pay | Admitting: Certified Registered Nurse Anesthetist

## 2015-12-15 ENCOUNTER — Inpatient Hospital Stay (HOSPITAL_COMMUNITY): Payer: Medicare Other

## 2015-12-15 ENCOUNTER — Inpatient Hospital Stay (HOSPITAL_COMMUNITY): Payer: Medicare Other | Admitting: Anesthesiology

## 2015-12-15 ENCOUNTER — Encounter (HOSPITAL_COMMUNITY): Admission: EM | Disposition: A | Discharge status: Rehabilitation Facility | Payer: Self-pay | Source: Home / Self Care

## 2015-12-15 HISTORY — PX: APPLICATION OF WOUND VAC: SHX5189

## 2015-12-15 HISTORY — PX: PERCUTANEOUS PINNING: SHX2209

## 2015-12-15 HISTORY — PX: EXTERNAL FIXATION REMOVAL: SHX5040

## 2015-12-15 HISTORY — PX: INCISION AND DRAINAGE: SHX5863

## 2015-12-15 LAB — TYPE AND SCREEN
ABO/RH(D): O NEG
Antibody Screen: NEGATIVE

## 2015-12-15 SURGERY — REMOVAL, EXTERNAL FIXATION DEVICE, LOWER EXTREMITY
Anesthesia: General | Site: Leg Lower | Laterality: Bilateral

## 2015-12-15 MED ORDER — ROCURONIUM BROMIDE 50 MG/5ML IV SOLN
INTRAVENOUS | Status: AC
  Filled 2015-12-15: qty 1

## 2015-12-15 MED ORDER — SUCCINYLCHOLINE CHLORIDE 20 MG/ML IJ SOLN
INTRAMUSCULAR | Status: AC
  Filled 2015-12-15: qty 1

## 2015-12-15 MED ORDER — SODIUM CHLORIDE 0.9 % IJ SOLN
INTRAMUSCULAR | Status: AC
  Filled 2015-12-15: qty 10

## 2015-12-15 MED ORDER — LIDOCAINE HCL (CARDIAC) 20 MG/ML IV SOLN
INTRAVENOUS | Status: AC
  Filled 2015-12-15: qty 5

## 2015-12-15 MED ORDER — PHENYLEPHRINE HCL 10 MG/ML IJ SOLN
INTRAMUSCULAR | Status: DC | PRN
Start: 2015-12-15 — End: 2015-12-15
  Administered 2015-12-15: 160 ug via INTRAVENOUS
  Administered 2015-12-15: 80 ug via INTRAVENOUS
  Administered 2015-12-15: 160 ug via INTRAVENOUS
  Administered 2015-12-15: 80 ug via INTRAVENOUS

## 2015-12-15 MED ORDER — PROMETHAZINE HCL 25 MG/ML IJ SOLN: 6.2500 mg | INTRAMUSCULAR | Status: DC | PRN

## 2015-12-15 MED ORDER — SODIUM CHLORIDE 0.9 % IV SOLN: Freq: Once | INTRAVENOUS | Status: DC

## 2015-12-15 MED ORDER — ONDANSETRON HCL 4 MG/2ML IJ SOLN
INTRAMUSCULAR | Status: DC | PRN
  Administered 2015-12-15: 4 mg via INTRAVENOUS

## 2015-12-15 MED ORDER — PHENYLEPHRINE 40 MCG/ML (10ML) SYRINGE FOR IV PUSH (FOR BLOOD PRESSURE SUPPORT)
PREFILLED_SYRINGE | INTRAVENOUS | Status: AC
  Filled 2015-12-15: qty 10

## 2015-12-15 MED ORDER — SODIUM CHLORIDE 0.9 % IR SOLN
Status: DC | PRN
  Administered 2015-12-15 (×2): 3000 mL

## 2015-12-15 MED ORDER — SUGAMMADEX SODIUM 200 MG/2ML IV SOLN
INTRAVENOUS | Status: DC | PRN
  Administered 2015-12-15: 200 mg via INTRAVENOUS

## 2015-12-15 MED ORDER — PROPOFOL 10 MG/ML IV BOLUS
INTRAVENOUS | Status: AC
  Filled 2015-12-15: qty 20

## 2015-12-15 MED ORDER — MIDAZOLAM HCL 2 MG/2ML IJ SOLN
INTRAMUSCULAR | Status: AC
  Filled 2015-12-15: qty 2

## 2015-12-15 MED ORDER — 0.9 % SODIUM CHLORIDE (POUR BTL) OPTIME
TOPICAL | Status: DC | PRN
  Administered 2015-12-15: 1000 mL

## 2015-12-15 MED ORDER — SUGAMMADEX SODIUM 200 MG/2ML IV SOLN
INTRAVENOUS | Status: AC
  Filled 2015-12-15: qty 2

## 2015-12-15 MED ORDER — PHENYLEPHRINE HCL 10 MG/ML IJ SOLN
10.0000 mg | INTRAVENOUS | Status: DC | PRN
  Administered 2015-12-15: 100 ug/min via INTRAVENOUS

## 2015-12-15 MED ORDER — HYDROMORPHONE HCL 1 MG/ML IJ SOLN: 0.2500 mg | INTRAMUSCULAR | Status: DC | PRN

## 2015-12-15 MED ORDER — EPHEDRINE SULFATE 50 MG/ML IJ SOLN
INTRAMUSCULAR | Status: DC | PRN
  Administered 2015-12-15: 15 mg via INTRAVENOUS
  Administered 2015-12-15: 20 mg via INTRAVENOUS

## 2015-12-15 MED ORDER — ONDANSETRON HCL 4 MG/2ML IJ SOLN
INTRAMUSCULAR | Status: AC
  Filled 2015-12-15: qty 2

## 2015-12-15 MED ORDER — EPHEDRINE SULFATE 50 MG/ML IJ SOLN
INTRAMUSCULAR | Status: AC
  Filled 2015-12-15: qty 1

## 2015-12-15 MED ORDER — LACTATED RINGERS IV SOLN
INTRAVENOUS | Status: DC | PRN
  Administered 2015-12-15: 08:00:00 via INTRAVENOUS

## 2015-12-15 MED ORDER — ROCURONIUM BROMIDE 50 MG/5ML IV SOLN
INTRAVENOUS | Status: AC
Start: 2015-12-15 — End: 2015-12-15
  Filled 2015-12-15: qty 2

## 2015-12-15 MED ORDER — FENTANYL CITRATE (PF) 250 MCG/5ML IJ SOLN
INTRAMUSCULAR | Status: AC
  Filled 2015-12-15: qty 5

## 2015-12-15 MED ORDER — LIDOCAINE HCL (CARDIAC) 20 MG/ML IV SOLN
INTRAVENOUS | Status: DC | PRN
  Administered 2015-12-15: 60 mg via INTRAVENOUS

## 2015-12-15 MED ORDER — ROCURONIUM BROMIDE 100 MG/10ML IV SOLN
INTRAVENOUS | Status: DC | PRN
  Administered 2015-12-15: 50 mg via INTRAVENOUS
  Administered 2015-12-15: 20 mg via INTRAVENOUS

## 2015-12-15 MED ORDER — MIDAZOLAM HCL 5 MG/5ML IJ SOLN
INTRAMUSCULAR | Status: DC | PRN
  Administered 2015-12-15: 2 mg via INTRAVENOUS

## 2015-12-15 MED ORDER — PROPOFOL 10 MG/ML IV BOLUS
INTRAVENOUS | Status: DC | PRN
  Administered 2015-12-15: 180 mg via INTRAVENOUS

## 2015-12-15 MED ORDER — FENTANYL CITRATE (PF) 100 MCG/2ML IJ SOLN
INTRAMUSCULAR | Status: DC | PRN
  Administered 2015-12-15: 50 ug via INTRAVENOUS
  Administered 2015-12-15: 100 ug via INTRAVENOUS

## 2015-12-15 SURGICAL SUPPLY — 92 items
BANDAGE ELASTIC 4 VELCRO ST LF (GAUZE/BANDAGES/DRESSINGS) ×4 IMPLANT
BANDAGE ELASTIC 6 VELCRO ST LF (GAUZE/BANDAGES/DRESSINGS) ×5 IMPLANT
BANDAGE ESMARK 6X9 LF (GAUZE/BANDAGES/DRESSINGS) IMPLANT
BLADE LONG MED 31MMX9MM (MISCELLANEOUS) ×1
BLADE LONG MED 31X9 (MISCELLANEOUS) ×2 IMPLANT
BLADE SAW SAG 73X25 THK (BLADE) ×2
BLADE SAW SGTL 73X25 THK (BLADE) ×2 IMPLANT
BLADE SURG 10 STRL SS (BLADE) ×8 IMPLANT
BNDG CMPR 9X6 STRL LF SNTH (GAUZE/BANDAGES/DRESSINGS)
BNDG COHESIVE 4X5 TAN STRL (GAUZE/BANDAGES/DRESSINGS) ×4 IMPLANT
BNDG COHESIVE 6X5 TAN STRL LF (GAUZE/BANDAGES/DRESSINGS) ×4 IMPLANT
BNDG ESMARK 6X9 LF (GAUZE/BANDAGES/DRESSINGS)
BNDG GAUZE ELAST 4 BULKY (GAUZE/BANDAGES/DRESSINGS) ×4 IMPLANT
BRUSH SCRUB DISP (MISCELLANEOUS) ×8 IMPLANT
CANISTER WOUND CARE 500ML ATS (WOUND CARE) ×8 IMPLANT
CLOSURE WOUND 1/2 X4 (GAUZE/BANDAGES/DRESSINGS) ×1
COVER MAYO STAND STRL (DRAPES) ×4 IMPLANT
COVER SURGICAL LIGHT HANDLE (MISCELLANEOUS) ×8 IMPLANT
CUFF TOURNIQUET SINGLE 34IN LL (TOURNIQUET CUFF) IMPLANT
CUFF TOURNIQUET SINGLE 44IN (TOURNIQUET CUFF) IMPLANT
DRAIN PENROSE 1/2X12 LTX STRL (WOUND CARE) IMPLANT
DRAPE C-ARM 42X72 X-RAY (DRAPES) ×7 IMPLANT
DRAPE C-ARMOR (DRAPES) ×4 IMPLANT
DRAPE EXTREMITY BILATERAL (DRAPES) ×3 IMPLANT
DRAPE EXTREMITY T 121X128X90 (DRAPE) ×8 IMPLANT
DRAPE IMP U-DRAPE 54X76 (DRAPES) ×4 IMPLANT
DRAPE INCISE IOBAN 66X45 STRL (DRAPES) IMPLANT
DRAPE PROXIMA HALF (DRAPES) ×8 IMPLANT
DRAPE U-SHAPE 47X51 STRL (DRAPES) ×4 IMPLANT
DRSG ADAPTIC 3X8 NADH LF (GAUZE/BANDAGES/DRESSINGS) ×4 IMPLANT
DRSG MEPITEL 4X7.2 (GAUZE/BANDAGES/DRESSINGS) ×3 IMPLANT
DRSG PAD ABDOMINAL 8X10 ST (GAUZE/BANDAGES/DRESSINGS) ×4 IMPLANT
DRSG VAC ATS MED SENSATRAC (GAUZE/BANDAGES/DRESSINGS) ×3 IMPLANT
DRSG VAC ATS SM SENSATRAC (GAUZE/BANDAGES/DRESSINGS) ×3 IMPLANT
ELECT REM PT RETURN 9FT ADLT (ELECTROSURGICAL) ×4
ELECTRODE REM PT RTRN 9FT ADLT (ELECTROSURGICAL) ×2 IMPLANT
EVACUATOR 1/8 PVC DRAIN (DRAIN) IMPLANT
FLUID NSS /IRRIG 3000 ML XXX (IV SOLUTION) ×12 IMPLANT
GAUZE SPONGE 4X4 12PLY STRL (GAUZE/BANDAGES/DRESSINGS) ×4 IMPLANT
GLOVE BIO SURGEON STRL SZ7.5 (GLOVE) ×4 IMPLANT
GLOVE BIO SURGEON STRL SZ8 (GLOVE) ×4 IMPLANT
GLOVE BIO SURGEON STRL SZ8.5 (GLOVE) ×4 IMPLANT
GLOVE BIOGEL PI IND STRL 7.5 (GLOVE) ×2 IMPLANT
GLOVE BIOGEL PI IND STRL 8 (GLOVE) ×2 IMPLANT
GLOVE BIOGEL PI INDICATOR 7.5 (GLOVE) ×2
GLOVE BIOGEL PI INDICATOR 8 (GLOVE) ×2
GOWN STRL REUS W/ TWL LRG LVL3 (GOWN DISPOSABLE) ×4 IMPLANT
GOWN STRL REUS W/ TWL XL LVL3 (GOWN DISPOSABLE) ×2 IMPLANT
GOWN STRL REUS W/TWL LRG LVL3 (GOWN DISPOSABLE) ×8
GOWN STRL REUS W/TWL XL LVL3 (GOWN DISPOSABLE) ×4
GUIDEPIN 3.2X17.5 THRD DISP (PIN) ×9 IMPLANT
GUIDEWIRE BALL NOSE 80CM (WIRE) ×4 IMPLANT
HANDPIECE INTERPULSE COAX TIP (DISPOSABLE) ×4
KIT BASIN OR (CUSTOM PROCEDURE TRAY) ×4 IMPLANT
KIT INFUSE LRG II (Orthopedic Implant) IMPLANT
KIT ROOM TURNOVER OR (KITS) ×4 IMPLANT
MANIFOLD NEPTUNE II (INSTRUMENTS) ×4 IMPLANT
NS IRRIG 1000ML POUR BTL (IV SOLUTION) ×4 IMPLANT
PACK GENERAL/GYN (CUSTOM PROCEDURE TRAY) ×4 IMPLANT
PACK UNIVERSAL I (CUSTOM PROCEDURE TRAY) ×4 IMPLANT
PAD ARMBOARD 7.5X6 YLW CONV (MISCELLANEOUS) ×8 IMPLANT
PAD CAST 4YDX4 CTTN HI CHSV (CAST SUPPLIES) ×3 IMPLANT
PADDING CAST COTTON 4X4 STRL (CAST SUPPLIES) ×4
PADDING CAST COTTON 6X4 STRL (CAST SUPPLIES) ×3 IMPLANT
SET HNDPC FAN SPRY TIP SCT (DISPOSABLE) ×1 IMPLANT
SPLINT FIBERGLASS 4X15 (CAST SUPPLIES) ×9 IMPLANT
SPONGE LAP 18X18 X RAY DECT (DISPOSABLE) IMPLANT
STAPLER VISISTAT 35W (STAPLE) ×4 IMPLANT
STOCKINETTE IMPERVIOUS LG (DRAPES) IMPLANT
STRIP CLOSURE SKIN 1/2X4 (GAUZE/BANDAGES/DRESSINGS) ×3 IMPLANT
SUT ETHILON 2 0 FS 18 (SUTURE) IMPLANT
SUT PDS AB 2-0 CT1 27 (SUTURE) IMPLANT
SUT PROLENE 0 CT 1 30 (SUTURE) ×8 IMPLANT
SUT PROLENE 2 0 CT 1 (SUTURE) ×4 IMPLANT
SUT SILK 2 0 (SUTURE)
SUT SILK 2 0 SH CR/8 (SUTURE) ×4 IMPLANT
SUT SILK 2-0 18XBRD TIE 12 (SUTURE) IMPLANT
SUT VIC AB 0 CT1 27 (SUTURE) ×8
SUT VIC AB 0 CT1 27XBRD ANBCTR (SUTURE) ×4 IMPLANT
SUT VIC AB 2-0 CT1 27 (SUTURE) ×8
SUT VIC AB 2-0 CT1 TAPERPNT 27 (SUTURE) ×4 IMPLANT
SUT VIC AB 2-0 CT3 27 (SUTURE) IMPLANT
SUT VICRYL AB 2 0 TIES (SUTURE) ×4 IMPLANT
SWAB COLLECTION DEVICE MRSA (MISCELLANEOUS) IMPLANT
TOWEL OR 17X24 6PK STRL BLUE (TOWEL DISPOSABLE) ×4 IMPLANT
TOWEL OR 17X26 10 PK STRL BLUE (TOWEL DISPOSABLE) ×8 IMPLANT
TRAY FOLEY CATH 16FRSI W/METER (SET/KITS/TRAYS/PACK) ×4 IMPLANT
TUBE ANAEROBIC SPECIMEN COL (MISCELLANEOUS) IMPLANT
TUBE CONNECTING 12'X1/4 (SUCTIONS) ×1
TUBE CONNECTING 12X1/4 (SUCTIONS) ×3 IMPLANT
WATER STERILE IRR 1000ML POUR (IV SOLUTION) ×1 IMPLANT
YANKAUER SUCT BULB TIP NO VENT (SUCTIONS) IMPLANT

## 2015-12-15 NOTE — Anesthesia Postprocedure Evaluation (Signed)
Anesthesia Post Note  Patient: [name]  Procedure(s) Performed: Procedure(s) (LRB): REMOVAL EXTERNAL FIXATION LEG (Bilateral) INCISION AND DRAINAGE BILATERAL LEG WOUNDS (Bilateral) PERCUTANEOUS PINNING EXTREMITY  BILATERAL TIBIAL FRACTURES AND LEFT ANKLE FRACTURE FOR TEMPORARY STABILIZATION (Bilateral) APPLICATION OF WOUND VAC (Bilateral)  Patient location during evaluation: PACU Anesthesia Type: General Level of consciousness: awake and alert Pain management: pain level controlled Vital Signs Assessment: post-procedure vital signs reviewed and stable Respiratory status: spontaneous breathing Cardiovascular status: blood pressure returned to baseline Anesthetic complications: no    Last Vitals:  Filed Vitals:   12/15/15 1200 12/15/15 1320  BP: 92/63 142/80  Pulse: 87 84  Temp:  36.6 C  Resp:  16    Last Pain:  Filed Vitals:   12/15/15 1320  PainSc: 0-No pain                 Kennieth Rad

## 2015-12-15 NOTE — Anesthesia Preprocedure Evaluation (Addendum)
Anesthesia Evaluation  Patient identified by MRN, date of birth, ID band Patient awake    Reviewed: Allergy & Precautions, NPO status , Patient's Chart, lab work & pertinent test results  History of Anesthesia Complications Negative for: history of anesthetic complications  Airway Mallampati: I  TM Distance: >3 FB Neck ROM: Full    Dental no notable dental hx. (+) Dental Advisory Given, Teeth Intact   Pulmonary Current Smoker,    Pulmonary exam normal        Cardiovascular negative cardio ROS Normal cardiovascular exam     Neuro/Psych Seizures -,  C5 partial Quadraplegia negative psych ROS   GI/Hepatic negative GI ROS, Neg liver ROS,   Endo/Other  negative endocrine ROS  Renal/GU negative Renal ROS     Musculoskeletal   Abdominal   Peds  Hematology   Anesthesia Other Findings   Reproductive/Obstetrics                            Lab Results  Component Value Date   WBC 8.2 12/13/2015   HGB 8.0* 12/13/2015   HCT 23.5* 12/13/2015   MCV 88.0 12/13/2015   PLT 175 12/13/2015   Lab Results  Component Value Date   CREATININE 0.71 12/13/2015   BUN 11 12/13/2015   NA 140 12/13/2015   K 3.2* 12/13/2015   CL 103 12/13/2015   CO2 26 12/13/2015    Anesthesia Physical  Anesthesia Plan  ASA: III  Anesthesia Plan: General   Post-op Pain Management:    Induction: Intravenous  Airway Management Planned: Oral ETT  Additional Equipment:   Intra-op Plan:   Post-operative Plan: Extubation in OR  Informed Consent: I have reviewed the patients History and Physical, chart, labs and discussed the procedure including the risks, benefits and alternatives for the proposed anesthesia with the patient or authorized representative who has indicated his/her understanding and acceptance.   Dental advisory given  Plan Discussed with: CRNA, Surgeon and Anesthesiologist  Anesthesia Plan  Comments:        Anesthesia Quick Evaluation

## 2015-12-15 NOTE — Progress Notes (Signed)
OT CancellationDischarge Note  Patient Details Name: Navid Lenzen MRN: [id number] DOB: [birth year]   Cancelled Treatment:     OT orders discontinued.  OT signing off at this time.  Note, pt for possible bil. BKAs.  Please reorder as appropriate.     Angelene Giovanni [place], OTR/L [phone]  12/15/2015, 4:01 PM

## 2015-12-15 NOTE — Progress Notes (Signed)
Trauma Service Note  Subjective: There is some confusion about what will be happening with Mr. Sobotka in the near future.  There is a possibility that he will need bilateral amputations  Objective: Vital signs in last 24 hours: Temp:  [97.4 F (36.3 C)-102.4 F (39.1 C)] 97.8 F (36.6 C) (02/23 1320) Pulse Rate:  [77-108] 84 (02/23 1320) Resp:  [16] 16 (02/23 1320) BP: (90-142)/(50-82) 142/80 mmHg (02/23 1320) SpO2:  [99 %-100 %] 100 % (02/23 1320) Last BM Date: 12/14/15  Intake/Output from previous day: 02/22 0701 - 02/23 0700 In: 1190 [P.O.:1190] Out: 1900 [Urine:1900] Intake/Output this shift: Total I/O In: 1900 [I.V.:1900] Out: 850 [Urine:750; Blood:100]  General: Covered with blankets over his head.  No distress  Lungs: Clear  Abd: Benign  Extremities: No changegs from our standpoint  Neuro: Intact  Lab Results: CBC   Recent Labs  12/13/15 0559  WBC 8.2  HGB 8.0*  HCT 23.5*  PLT 175   BMET  Recent Labs  12/13/15 0559  NA 140  K 3.2*  CL 103  CO2 26  GLUCOSE 89  BUN 11  CREATININE 0.71  CALCIUM 8.1*   PT/INR No results for input(s): LABPROT, INR in the last 72 hours. ABG No results for input(s): PHART, HCO3 in the last 72 hours.  Invalid input(s): PCO2, PO2  Studies/Results: Dg Tibia/fibula Right  12/15/2015  CLINICAL DATA:  Patient with pinning of right tibia and left ankle awaiting possible amputation. EXAM: RIGHT TIBIA AND FIBULA - 2 VIEW; LEFT ANKLE COMPLETE - 3+ VIEW; DG C-ARM 61-120 MIN COMPARISON:  CT ankle 12/09/2015; intraoperative radiographs 12/08/2015 FINDINGS: Four intraoperative fluoroscopic images were submitted of the right tibia and fibula demonstrating placement of wires within the mid aspect of the right tibia and fibula. Re- demonstrated comminuted tibia and fibula fractures. Patient status post placement of left distal tibia wire near the ankle joint. Re- demonstrated comminuted distal tibia and fibular fractures.  IMPRESSION: Patient status post intraoperative pinning of comminuted right tibia and fibular fractures. Patient status post intraoperative pinning of comminuted distal left tibia and fibular fractures. Electronically Signed   By: Annia Belt M.D.   On: 12/15/2015 11:37   Dg Ankle Complete Left  12/15/2015  CLINICAL DATA:  Patient with pinning of right tibia and left ankle awaiting possible amputation. EXAM: RIGHT TIBIA AND FIBULA - 2 VIEW; LEFT ANKLE COMPLETE - 3+ VIEW; DG C-ARM 61-120 MIN COMPARISON:  CT ankle 12/09/2015; intraoperative radiographs 12/08/2015 FINDINGS: Four intraoperative fluoroscopic images were submitted of the right tibia and fibula demonstrating placement of wires within the mid aspect of the right tibia and fibula. Re- demonstrated comminuted tibia and fibula fractures. Patient status post placement of left distal tibia wire near the ankle joint. Re- demonstrated comminuted distal tibia and fibular fractures. IMPRESSION: Patient status post intraoperative pinning of comminuted right tibia and fibular fractures. Patient status post intraoperative pinning of comminuted distal left tibia and fibular fractures. Electronically Signed   By: Annia Belt M.D.   On: 12/15/2015 11:37   Dg Foot Complete Right  12/14/2015  CLINICAL DATA:  Followup fracture bilateral lower extremities osteopenia. EXAM: RIGHT FOOT COMPLETE - 3+ VIEW COMPARISON:  Multiple prior studies FINDINGS: External fixation device partially visualized over the distal tibia. Tibia fracture not included on this study. Distal fibular fracture is included and shows improved position and alignment. No acute abnormalities involving the right foot. Diffuse IMPRESSION: No acute findings involving right foot. External fixation device partially visualized. Electronically Signed  By: Esperanza Heir M.D.   On: 12/14/2015 10:34   Dg C-arm 61-120 Min  12/15/2015  CLINICAL DATA:  Patient with pinning of right tibia and left ankle awaiting  possible amputation. EXAM: RIGHT TIBIA AND FIBULA - 2 VIEW; LEFT ANKLE COMPLETE - 3+ VIEW; DG C-ARM 61-120 MIN COMPARISON:  CT ankle 12/09/2015; intraoperative radiographs 12/08/2015 FINDINGS: Four intraoperative fluoroscopic images were submitted of the right tibia and fibula demonstrating placement of wires within the mid aspect of the right tibia and fibula. Re- demonstrated comminuted tibia and fibula fractures. Patient status post placement of left distal tibia wire near the ankle joint. Re- demonstrated comminuted distal tibia and fibular fractures. IMPRESSION: Patient status post intraoperative pinning of comminuted right tibia and fibular fractures. Patient status post intraoperative pinning of comminuted distal left tibia and fibular fractures. Electronically Signed   By: Annia Belt M.D.   On: 12/15/2015 11:37    Anti-infectives: Anti-infectives    Start     Dose/Rate Route Frequency Ordered Stop   12/11/15 1800  ceFAZolin (ANCEF) IVPB 2 g/50 mL premix    Comments:  Anesthesia to give preop   2 g 100 mL/hr over 30 Minutes Intravenous Every 8 hours 12/11/15 1235     12/09/15 1030  ceFAZolin (ANCEF) IVPB 2 g/50 mL premix  Status:  Discontinued    Comments:  Anesthesia to give preop   2 g 100 mL/hr over 30 Minutes Intravenous 3 times per day 12/09/15 1001 12/11/15 1235   12/08/15 2215  ceFAZolin (ANCEF) IVPB 2 g/50 mL premix    Comments:  Anesthesia to give preop   2 g 100 mL/hr over 30 Minutes Intravenous  Once 12/08/15 2203 12/08/15 2305   12/08/15 2000  ceFAZolin (ANCEF) IVPB 1 g/50 mL premix     1 g 100 mL/hr over 30 Minutes Intravenous  Once 12/08/15 1956 12/08/15 2117      Assessment/Plan: s/p Procedure(s): REMOVAL EXTERNAL FIXATION LEG INCISION AND DRAINAGE BILATERAL LEG WOUNDS PERCUTANEOUS PINNING EXTREMITY  BILATERAL TIBIAL FRACTURES AND LEFT ANKLE FRACTURE FOR TEMPORARY STABILIZATION APPLICATION OF WOUND VAC Possible surgery tomorrow for bilateral amputations  LOS: 7  days   Marta Lamas. Gae Bon, MD, FACS [phone] Trauma Surgeon 12/15/2015

## 2015-12-15 NOTE — Progress Notes (Signed)
PT Cancellation Note  Patient Details Name: [name] MRN: [id number] DOB: [birth year]   Cancelled Treatment:    Reason Eval/Treat Not Completed: Medical issues which prohibited therapy, patient currently in surgery.    Christiane Ha, PT, CSCS Pager [phone] Office [phone]  12/15/2015, 11:24 AM

## 2015-12-15 NOTE — Transfer of Care (Signed)
Immediate Anesthesia Transfer of Care Note  Patient: [name]  Procedure(s) Performed: Procedure(s): REMOVAL EXTERNAL FIXATION LEG (Bilateral) INCISION AND DRAINAGE BILATERAL LEG WOUNDS (Bilateral) PERCUTANEOUS PINNING EXTREMITY  BILATERAL TIBIAL FRACTURES AND LEFT ANKLE FRACTURE FOR TEMPORARY STABILIZATION (Bilateral) APPLICATION OF WOUND VAC (Bilateral)  Patient Location: PACU  Anesthesia Type:General  Level of Consciousness: awake, alert , oriented and patient cooperative  Airway & Oxygen Therapy: Patient Spontanous Breathing and Patient connected to nasal cannula oxygen  Post-op Assessment: Report given to RN and Post -op Vital signs reviewed and stable  Post vital signs: Reviewed and stable  Last Vitals:  Filed Vitals:   12/14/15 2309 12/15/15 0634  BP:  105/59  Pulse:  83  Temp: 37.6 C 36.4 C  Resp:  16    Complications: No apparent anesthesia complications

## 2015-12-15 NOTE — Progress Notes (Signed)
Patient's sister, Rylynn Schoneman, called to say that patient's family does not agree with plan for surgery for bilateral amputations of legs.  She does not want patient to go to surgery in am.  She said a family member will be here around 7 am . She stated that Dr. Carola Frost could call her if he wants to speak with a family member.  Ms. Haffey does not feel that patient understands the surgery at this time.  Paged on call for Dr. Carola Frost.

## 2015-12-15 NOTE — Anesthesia Procedure Notes (Signed)
Procedure Name: Intubation Date/Time: 12/15/2015 8:38 AM Performed by: Rogelia Boga Pre-anesthesia Checklist: Patient identified, Emergency Drugs available, Suction available, Patient being monitored and Timeout performed Patient Re-evaluated:Patient Re-evaluated prior to inductionOxygen Delivery Method: Circle system utilized Preoxygenation: Pre-oxygenation with 100% oxygen Intubation Type: IV induction Ventilation: Mask ventilation without difficulty Laryngoscope Size: Mac and 4 Grade View: Grade II Tube type: Oral Tube size: 7.5 mm Number of attempts: 1 Airway Equipment and Method: Stylet Placement Confirmation: ETT inserted through vocal cords under direct vision,  positive ETCO2 and breath sounds checked- equal and bilateral Secured at: 22 cm Tube secured with: Tape Dental Injury: Teeth and Oropharynx as per pre-operative assessment

## 2015-12-15 NOTE — Progress Notes (Signed)
I discussed with the patient and his sister the risks and benefits of surgery for both open tibiae fractures, including the possibility of multiple reoperations to achieve union, infection, nerve injury, vessel injury, wound breakdown, arthritis, symptomatic hardware, DVT/ PE, loss of motion, and need for further surgery among others.  We also specifically discussed the elevated risk of soft tissue breakdown that could lead to amputation, and the option to perform one or both side below knee amputations as an alternative. He and his sister understood these risks and wished to proceed.  Myrene Galas, MD [facility] [phone] (p)

## 2015-12-15 NOTE — Progress Notes (Signed)
Informed Dr. Madelon Lips of my conversation with Ms Bohnenkamp, patient's sister, indicating that patient's family does not agree with surgery at this time.

## 2015-12-16 ENCOUNTER — Encounter (HOSPITAL_COMMUNITY): Payer: Self-pay | Admitting: Orthopedic Surgery

## 2015-12-16 ENCOUNTER — Encounter (HOSPITAL_COMMUNITY): Admission: EM | Disposition: A | Discharge status: Rehabilitation Facility | Payer: Self-pay | Source: Home / Self Care

## 2015-12-16 SURGERY — AMPUTATION BELOW KNEE
Anesthesia: General | Laterality: Bilateral

## 2015-12-16 NOTE — Progress Notes (Signed)
I spoke with patient's sister who indicated that mother and brother were concerned and unsettled regarding the decision for amputations since he uses his legs in some way as a fulcrum or point of resistance for transfers.  I explained that further abx and vac treatment now that he has had the fixator off and both internal intramedullary stabilization and external splinting should increase his margin for safety with closure and that waiting until Monday or Tuesday would be totally acceptable, particularly if this would allow for resolution of opinion.  Family meeting tomorrow and over the weekend; will continue to assess mental clarity following surgery.  Myrene Galas, MD [facility] [phone] (p)

## 2015-12-16 NOTE — Brief Op Note (Signed)
12/08/2015 - 12/15/2015  PATIENT:  [name]  45 y.o. male  PROCEDURE:  Procedure(s): REMOVAL EXTERNAL FIXATION LEG (Bilateral) INCISION AND DRAINAGE WITH EXCISION OF OPEN BILATERAL TIBIA AND FIBULA FRACTURES (Bilateral) PERCUTANEOUS PINNING EXTREMITY  BILATERAL TIBIAL FRACTURES AND LEFT ANKLE FRACTURES APPLICATION OF WOUND VAC (Bilateral)  SURGEON:  Surgeon(s) and Role:    * Myrene Galas, MD - Primary  PHYSICIAN ASSISTANT: Montez Morita, PA-C  ANESTHESIA:   general  I/O:     SPECIMEN:  No Specimen  TOURNIQUET:  NONE

## 2015-12-16 NOTE — Progress Notes (Signed)
Pt did not eat hospital provided lunch, family brought him food from outside. No concerns expressed at this time

## 2015-12-16 NOTE — Progress Notes (Signed)
[facility] Progress Note  1 Day Post-Op  Subjective: Confusion with confirmation of bilateral LE amputation. Family did not want this procedure to occur. Spoke with patient this morning. He agrees with the amputation of bilateral LE and is aggravated that it did not happen this morning. Denies SOB, CP, abd pain. No other complaints.  Objective: Vital signs in last 24 hours: Temp:  [97.4 F (36.3 C)-101.1 F (38.4 C)] 97.8 F (36.6 C) (02/24 0620) Pulse Rate:  [77-118] 78 (02/24 0620) Resp:  [16] 16 (02/24 0620) BP: (92-142)/(50-84) 130/84 mmHg (02/24 0620) SpO2:  [94 %-100 %] 94 % (02/24 0620) Last BM Date: 12/14/15  Intake/Output from previous day: 02/23 0701 - 02/24 0700 In: 3100 [P.O.:1200; I.V.:1900] Out: 2750 [Urine:2475; Drains:175; Blood:100] Intake/Output this shift:    PE: Gen:  Alert, NAD, pleasant Card:  RRR, no M/G/R heard Pulm:  CTA, no W/R/R Abd: Soft, NT/ND, +BS, no HSM, incisions C/D/I, wounds draining well, no abdominal scars noted Ext:  No erythema, edema, or tenderness  Lab Results:  No results for input(s): WBC, HGB, HCT, PLT in the last 72 hours. BMET No results for input(s): NA, K, CL, CO2, GLUCOSE, BUN, CREATININE, CALCIUM in the last 72 hours. PT/INR No results for input(s): LABPROT, INR in the last 72 hours. CMP     Component Value Date/Time   NA 140 12/13/2015 0559   NA 143 08/15/2015 1406   K 3.2* 12/13/2015 0559   CL 103 12/13/2015 0559   CO2 26 12/13/2015 0559   GLUCOSE 89 12/13/2015 0559   GLUCOSE 61* 08/15/2015 1406   BUN 11 12/13/2015 0559   BUN 12 08/15/2015 1406   CREATININE 0.71 12/13/2015 0559   CALCIUM 8.1* 12/13/2015 0559   PROT 5.7* 12/08/2015 2012   PROT 6.6 08/15/2015 1406   ALBUMIN 2.8* 12/08/2015 2012   ALBUMIN 3.7 08/15/2015 1406   AST 27 12/08/2015 2012   ALT 19 12/08/2015 2012   ALKPHOS 59 12/08/2015 2012   BILITOT 1.0 12/08/2015 2012   BILITOT 0.3 08/15/2015 1406   GFRNONAA >60 12/13/2015 0559    GFRAA >60 12/13/2015 0559   Lipase  No results found for: LIPASE  Studies/Results: Dg Tibia/fibula Right  12/15/2015  CLINICAL DATA:  Patient with pinning of right tibia and left ankle awaiting possible amputation. EXAM: RIGHT TIBIA AND FIBULA - 2 VIEW; LEFT ANKLE COMPLETE - 3+ VIEW; DG C-ARM 61-120 MIN COMPARISON:  CT ankle 12/09/2015; intraoperative radiographs 12/08/2015 FINDINGS: Four intraoperative fluoroscopic images were submitted of the right tibia and fibula demonstrating placement of wires within the mid aspect of the right tibia and fibula. Re- demonstrated comminuted tibia and fibula fractures. Patient status post placement of left distal tibia wire near the ankle joint. Re- demonstrated comminuted distal tibia and fibular fractures. IMPRESSION: Patient status post intraoperative pinning of comminuted right tibia and fibular fractures. Patient status post intraoperative pinning of comminuted distal left tibia and fibular fractures. Electronically Signed   By: Annia Belt M.D.   On: 12/15/2015 11:37   Dg Ankle Complete Left  12/15/2015  CLINICAL DATA:  Patient with pinning of right tibia and left ankle awaiting possible amputation. EXAM: RIGHT TIBIA AND FIBULA - 2 VIEW; LEFT ANKLE COMPLETE - 3+ VIEW; DG C-ARM 61-120 MIN COMPARISON:  CT ankle 12/09/2015; intraoperative radiographs 12/08/2015 FINDINGS: Four intraoperative fluoroscopic images were submitted of the right tibia and fibula demonstrating placement of wires within the mid aspect of the right tibia and fibula. Re- demonstrated comminuted tibia and fibula  fractures. Patient status post placement of left distal tibia wire near the ankle joint. Re- demonstrated comminuted distal tibia and fibular fractures. IMPRESSION: Patient status post intraoperative pinning of comminuted right tibia and fibular fractures. Patient status post intraoperative pinning of comminuted distal left tibia and fibular fractures. Electronically Signed   By: Annia Belt M.D.   On: 12/15/2015 11:37   Dg Foot Complete Right  12/14/2015  CLINICAL DATA:  Followup fracture bilateral lower extremities osteopenia. EXAM: RIGHT FOOT COMPLETE - 3+ VIEW COMPARISON:  Multiple prior studies FINDINGS: External fixation device partially visualized over the distal tibia. Tibia fracture not included on this study. Distal fibular fracture is included and shows improved position and alignment. No acute abnormalities involving the right foot. Diffuse IMPRESSION: No acute findings involving right foot. External fixation device partially visualized. Electronically Signed   By: Esperanza Heir M.D.   On: 12/14/2015 10:34   Dg C-arm 61-120 Min  12/15/2015  CLINICAL DATA:  Patient with pinning of right tibia and left ankle awaiting possible amputation. EXAM: RIGHT TIBIA AND FIBULA - 2 VIEW; LEFT ANKLE COMPLETE - 3+ VIEW; DG C-ARM 61-120 MIN COMPARISON:  CT ankle 12/09/2015; intraoperative radiographs 12/08/2015 FINDINGS: Four intraoperative fluoroscopic images were submitted of the right tibia and fibula demonstrating placement of wires within the mid aspect of the right tibia and fibula. Re- demonstrated comminuted tibia and fibula fractures. Patient status post placement of left distal tibia wire near the ankle joint. Re- demonstrated comminuted distal tibia and fibular fractures. IMPRESSION: Patient status post intraoperative pinning of comminuted right tibia and fibular fractures. Patient status post intraoperative pinning of comminuted distal left tibia and fibular fractures. Electronically Signed   By: Annia Belt M.D.   On: 12/15/2015 11:37    Anti-infectives: Anti-infectives    Start     Dose/Rate Route Frequency Ordered Stop   12/11/15 1800  ceFAZolin (ANCEF) IVPB 2 g/50 mL premix    Comments:  Anesthesia to give preop   2 g 100 mL/hr over 30 Minutes Intravenous Every 8 hours 12/11/15 1235     12/09/15 1030  ceFAZolin (ANCEF) IVPB 2 g/50 mL premix  Status:  Discontinued     Comments:  Anesthesia to give preop   2 g 100 mL/hr over 30 Minutes Intravenous 3 times per day 12/09/15 1001 12/11/15 1235   12/08/15 2215  ceFAZolin (ANCEF) IVPB 2 g/50 mL premix    Comments:  Anesthesia to give preop   2 g 100 mL/hr over 30 Minutes Intravenous  Once 12/08/15 2203 12/08/15 2305   12/08/15 2000  ceFAZolin (ANCEF) IVPB 1 g/50 mL premix     1 g 100 mL/hr over 30 Minutes Intravenous  Once 12/08/15 1956 12/08/15 2117       Assessment/Plan  s/p Procedure(s): REMOVAL EXTERNAL FIXATION LEG INCISION AND DRAINAGE BILATERAL LEG WOUNDS PERCUTANEOUS PINNING EXTREMITY BILATERAL TIBIAL FRACTURES AND LEFT ANKLE FRACTURE FOR TEMPORARY STABILIZATION APPLICATION OF WOUND VAC Bilateral LE fractures - Ortho to complete Bilateral LE amputation   LOS: 8 days    Valinda Party 12/16/2015, 8:39 AM Pager: [phone]

## 2015-12-16 NOTE — Progress Notes (Signed)
PT Cancellation Note  Patient Details Name: Daymen Hassebrock MRN: [id number] DOB: 1970-11-15   Cancelled Treatment:    Reason Eval/Treat Not Completed: Fatigue/lethargy limiting ability to participate;Patient declined, no reason specified.  Will return at a later date.     Vena Austria 12/16/2015, 1:22 PM Durenda Hurt Renaldo Fiddler, [facility] Acute Rehab Services Pager [phone]

## 2015-12-16 NOTE — Progress Notes (Signed)
I spoke with patient this evening and explained the intraoperative findings.  In light of his left leg soft tissue defect, bilateral open fractures with bone loss, bilateral foot crush injuries, and paraplegia, I recommended bilateral BKAs.  I also had this discussion with his sister immediatel after surgery and she has spoken with the patient.  Family has some concerns about his mental clarity the day of surgery. He plans to speak further with his family and proceed with surgery if consensus.  Myrene Galas, MD [facility] [phone] (p)

## 2015-12-16 NOTE — Progress Notes (Signed)
Orthopaedic Trauma Service (OTS)   Subjective: Patient reports pain as minimal.  Voiced understanding today of the decision to proceed with bilateral BKA's given soft tissue defect, open fractures with bone loss, bilateral foot crush injuries, and paraplegia.  He plans to speak further with his family and proceed with surgery Monday or Tuesday.  Objective: Temp:  [97.8 F (36.6 C)-99.7 F (37.6 C)] 98.3 F (36.8 C) (02/24 2233) Pulse Rate:  [78-105] 105 (02/24 2233) Resp:  [16] 16 (02/24 2233) BP: (113-130)/(54-84) 113/54 mmHg (02/24 2233) SpO2:  [94 %-100 %] 98 % (02/24 2233) Physical Exam Splints fitting well bilaterally; vac dressings are in place and functioning. Toes warm with 2 CR  Assessment/Plan: 1 Day Post-Op Procedure(s) (LRB): REMOVAL EXTERNAL FIXATION LEG (Bilateral) INCISION AND DRAINAGE BILATERAL LEG WOUNDS (Bilateral) PERCUTANEOUS PINNING EXTREMITY  BILATERAL TIBIAL FRACTURES AND LEFT ANKLE FRACTURE FOR TEMPORARY STABILIZATION (Bilateral) APPLICATION OF WOUND VAC (Bilateral) 1. PAtient to speak further with family 2. BKA bilaterally on Mon or Tue 3. Continue abx given erythema of right open fracture and pin sites.  Myrene Galas, MD [facility] [phone] (p)

## 2015-12-16 NOTE — Care Management Important Message (Signed)
Important Message  Patient Details  Name: Russell SwamyMRN: [id number] Date of Birth: Jan 16, 1971   Medicare Important Message Given:  Yes    [name] [name] 12/16/2015, 1:15 PM

## 2015-12-17 NOTE — Progress Notes (Signed)
2 Days Post-Op  Subjective: Not sure what he wants to do with surgery  Objective: Vital signs in last 24 hours: Temp:  [97.8 F (36.6 C)-98.8 F (37.1 C)] 98.8 F (37.1 C) (02/25 0659) Pulse Rate:  [100-105] 101 (02/25 0659) Resp:  [16] 16 (02/25 0659) BP: (106-122)/(54-61) 106/55 mmHg (02/25 0659) SpO2:  [98 %-100 %] 100 % (02/25 0659) Last BM Date: 12/14/15  Intake/Output from previous day: 02/24 0701 - 02/25 0700 In: 960 [P.O.:960] Out: 2725 [Urine:2550; Drains:175] Intake/Output this shift:    Gen: Alert, NAD, pleasant Card: RRR,  Pulm: CTA, Ext dressings splints in place  Studies/Results: Dg Tibia/fibula Right  12/15/2015  CLINICAL DATA:  Patient with pinning of right tibia and left ankle awaiting possible amputation. EXAM: RIGHT TIBIA AND FIBULA - 2 VIEW; LEFT ANKLE COMPLETE - 3+ VIEW; DG C-ARM 61-120 MIN COMPARISON:  CT ankle 12/09/2015; intraoperative radiographs 12/08/2015 FINDINGS: Four intraoperative fluoroscopic images were submitted of the right tibia and fibula demonstrating placement of wires within the mid aspect of the right tibia and fibula. Re- demonstrated comminuted tibia and fibula fractures. Patient status post placement of left distal tibia wire near the ankle joint. Re- demonstrated comminuted distal tibia and fibular fractures. IMPRESSION: Patient status post intraoperative pinning of comminuted right tibia and fibular fractures. Patient status post intraoperative pinning of comminuted distal left tibia and fibular fractures. Electronically Signed   By: Annia Belt M.D.   On: 12/15/2015 11:37   Dg Ankle Complete Left  12/15/2015  CLINICAL DATA:  Patient with pinning of right tibia and left ankle awaiting possible amputation. EXAM: RIGHT TIBIA AND FIBULA - 2 VIEW; LEFT ANKLE COMPLETE - 3+ VIEW; DG C-ARM 61-120 MIN COMPARISON:  CT ankle 12/09/2015; intraoperative radiographs 12/08/2015 FINDINGS: Four intraoperative fluoroscopic images were submitted of the  right tibia and fibula demonstrating placement of wires within the mid aspect of the right tibia and fibula. Re- demonstrated comminuted tibia and fibula fractures. Patient status post placement of left distal tibia wire near the ankle joint. Re- demonstrated comminuted distal tibia and fibular fractures. IMPRESSION: Patient status post intraoperative pinning of comminuted right tibia and fibular fractures. Patient status post intraoperative pinning of comminuted distal left tibia and fibular fractures. Electronically Signed   By: Annia Belt M.D.   On: 12/15/2015 11:37   Dg C-arm 61-120 Min  12/15/2015  CLINICAL DATA:  Patient with pinning of right tibia and left ankle awaiting possible amputation. EXAM: RIGHT TIBIA AND FIBULA - 2 VIEW; LEFT ANKLE COMPLETE - 3+ VIEW; DG C-ARM 61-120 MIN COMPARISON:  CT ankle 12/09/2015; intraoperative radiographs 12/08/2015 FINDINGS: Four intraoperative fluoroscopic images were submitted of the right tibia and fibula demonstrating placement of wires within the mid aspect of the right tibia and fibula. Re- demonstrated comminuted tibia and fibula fractures. Patient status post placement of left distal tibia wire near the ankle joint. Re- demonstrated comminuted distal tibia and fibular fractures. IMPRESSION: Patient status post intraoperative pinning of comminuted right tibia and fibular fractures. Patient status post intraoperative pinning of comminuted distal left tibia and fibular fractures. Electronically Signed   By: Annia Belt M.D.   On: 12/15/2015 11:37    Anti-infectives: Anti-infectives    Start     Dose/Rate Route Frequency Ordered Stop   12/11/15 1800  ceFAZolin (ANCEF) IVPB 2 g/50 mL premix    Comments:  Anesthesia to give preop   2 g 100 mL/hr over 30 Minutes Intravenous Every 8 hours 12/11/15 1235     12/09/15  1030  ceFAZolin (ANCEF) IVPB 2 g/50 mL premix  Status:  Discontinued    Comments:  Anesthesia to give preop   2 g 100 mL/hr over 30 Minutes  Intravenous 3 times per day 12/09/15 1001 12/11/15 1235   12/08/15 2215  ceFAZolin (ANCEF) IVPB 2 g/50 mL premix    Comments:  Anesthesia to give preop   2 g 100 mL/hr over 30 Minutes Intravenous  Once 12/08/15 2203 12/08/15 2305   12/08/15 2000  ceFAZolin (ANCEF) IVPB 1 g/50 mL premix     1 g 100 mL/hr over 30 Minutes Intravenous  Once 12/08/15 1956 12/08/15 2117      Assessment/Plan: Bilateral le fractures, incomplete quad  Family members telling him not to do surgery now. Not sure what he wants to do. He thinks might get better.  I told him he likely will be in same situation months down the road.   He will discuss with ortho.  [facility] 12/17/2015
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
## 2015-12-17 NOTE — Progress Notes (Signed)
Orthopedic Tech Progress Note Patient Details:  [name] 03/11/1971 [id number]  Patient ID: [name], male   DOB: [birth year], 45 y.o.   MRN: [id number]   Saul Fordyce 12/17/2015, 9:12 AMCalled [employer] for bilateral limb guards.

## 2015-12-18 LAB — CULTURE, BLOOD (ROUTINE X 2)
Culture: NO GROWTH
Culture: NO GROWTH
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
## 2015-12-19 NOTE — Progress Notes (Signed)
Trauma Service Note  Subjective: Patient only wants his right leg removed.  No other concerns from a general trauma standpoint.  Objective: Vital signs in last 24 hours: Temp:  [98.2 F (36.8 C)-99.4 F (37.4 C)] 98.2 F (36.8 C) (02/27 0655) Pulse Rate:  [90-95] 95 (02/27 0655) Resp:  [16-17] 17 (02/27 0655) BP: (98-127)/(44-84) 102/58 mmHg (02/27 0655) SpO2:  [98 %-100 %] 100 % (02/27 0655) Last BM Date: 12/17/15  Intake/Output from previous day: 02/26 0701 - 02/27 0700 In: 1310 [P.O.:1160; IV Piggyback:150] Out: 3800 [Urine:3800] Intake/Output this shift:    General: No acute distres  Lungs: Clear  Abd: soft, bening  Extremities: As previously documented by Ortho  Neuro: Intact  Lab Results: CBC  No results for input(s): WBC, HGB, HCT, PLT in the last 72 hours. BMET No results for input(s): NA, K, CL, CO2, GLUCOSE, BUN, CREATININE, CALCIUM in the last 72 hours. PT/INR No results for input(s): LABPROT, INR in the last 72 hours. ABG No results for input(s): PHART, HCO3 in the last 72 hours.  Invalid input(s): PCO2, PO2  Studies/Results: No results found.  Anti-infectives: Anti-infectives    Start     Dose/Rate Route Frequency Ordered Stop   12/11/15 1800  ceFAZolin (ANCEF) IVPB 2 g/50 mL premix    Comments:  Anesthesia to give preop   2 g 100 mL/hr over 30 Minutes Intravenous Every 8 hours 12/11/15 1235     12/09/15 1030  ceFAZolin (ANCEF) IVPB 2 g/50 mL premix  Status:  Discontinued    Comments:  Anesthesia to give preop   2 g 100 mL/hr over 30 Minutes Intravenous 3 times per day 12/09/15 1001 12/11/15 1235   12/08/15 2215  ceFAZolin (ANCEF) IVPB 2 g/50 mL premix    Comments:  Anesthesia to give preop   2 g 100 mL/hr over 30 Minutes Intravenous  Once 12/08/15 2203 12/08/15 2305   12/08/15 2000  ceFAZolin (ANCEF) IVPB 1 g/50 mL premix     1 g 100 mL/hr over 30 Minutes Intravenous  Once 12/08/15 1956 12/08/15 2117      Assessment/Plan: s/p  Procedure(s): REMOVAL EXTERNAL FIXATION LEG INCISION AND DRAINAGE BILATERAL LEG WOUNDS PERCUTANEOUS PINNING EXTREMITY  BILATERAL TIBIAL FRACTURES AND LEFT ANKLE FRACTURE FOR TEMPORARY STABILIZATION APPLICATION OF WOUND VAC Transfer to Dr. Magdalene Patricia service  Surgery planned for tomorrow.  LOS: 11 days   [name]. Gae Bon, MD, FACS [phone] Trauma Surgeon 12/19/2015

## 2015-12-19 NOTE — Anesthesia Preprocedure Evaluation (Addendum)
Anesthesia Evaluation  Patient identified by MRN, date of birth, ID band Patient awake    Reviewed: Allergy & Precautions, H&P , NPO status , Patient's Chart, lab work & pertinent test results  Airway Mallampati: II  TM Distance: >3 FB Neck ROM: Full    Dental no notable dental hx. (+) Teeth Intact, Dental Advisory Given   Pulmonary Current Smoker,    Pulmonary exam normal breath sounds clear to auscultation       Cardiovascular negative cardio ROS   Rhythm:Regular     Neuro/Psych Seizures -, Well Controlled,  Quadraplegia C5 spinal cord injury negative psych ROS   GI/Hepatic negative GI ROS, Neg liver ROS,   Endo/Other  negative endocrine ROS  Renal/GU negative Renal ROS  negative genitourinary   Musculoskeletal   Abdominal   Peds  Hematology negative hematology ROS (+)   Anesthesia Other Findings   Reproductive/Obstetrics negative OB ROS                            Anesthesia Physical Anesthesia Plan  ASA: III  Anesthesia Plan: General   Post-op Pain Management:    Induction: Intravenous  Airway Management Planned: Oral ETT  Additional Equipment:   Intra-op Plan:   Post-operative Plan: Extubation in OR  Informed Consent: I have reviewed the patients History and Physical, chart, labs and discussed the procedure including the risks, benefits and alternatives for the proposed anesthesia with the patient or authorized representative who has indicated his/her understanding and acceptance.   Dental advisory given  Plan Discussed with: CRNA  Anesthesia Plan Comments:         Anesthesia Quick Evaluation
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
## 2015-12-20 ENCOUNTER — Inpatient Hospital Stay (HOSPITAL_COMMUNITY): Payer: Medicare Other | Admitting: Certified Registered Nurse Anesthetist

## 2015-12-20 ENCOUNTER — Encounter (HOSPITAL_COMMUNITY): Payer: Self-pay | Admitting: Anesthesiology

## 2015-12-20 ENCOUNTER — Encounter (HOSPITAL_COMMUNITY): Admission: EM | Disposition: A | Discharge status: Rehabilitation Facility | Payer: Self-pay | Source: Home / Self Care

## 2015-12-20 HISTORY — PX: AMPUTATION: SHX166

## 2015-12-20 LAB — CBC
HCT: 25.4 % — ABNORMAL LOW (ref 39.0–52.0)
Hemoglobin: 7.9 g/dL — ABNORMAL LOW (ref 13.0–17.0)
MCH: 29.4 pg (ref 26.0–34.0)
MCHC: 31.1 g/dL (ref 30.0–36.0)
MCV: 94.4 fL (ref 78.0–100.0)
Platelets: 677 10*3/uL — ABNORMAL HIGH (ref 150–400)
RBC: 2.69 MIL/uL — ABNORMAL LOW (ref 4.22–5.81)
RDW: 14.7 % (ref 11.5–15.5)
WBC: 12.7 10*3/uL — ABNORMAL HIGH (ref 4.0–10.5)

## 2015-12-20 LAB — COMPREHENSIVE METABOLIC PANEL
ALT: 9 U/L — ABNORMAL LOW (ref 17–63)
AST: 20 U/L (ref 15–41)
Albumin: 1.8 g/dL — ABNORMAL LOW (ref 3.5–5.0)
Alkaline Phosphatase: 57 U/L (ref 38–126)
Anion gap: 8 (ref 5–15)
BUN: 17 mg/dL (ref 6–20)
CO2: 30 mmol/L (ref 22–32)
Calcium: 8.7 mg/dL — ABNORMAL LOW (ref 8.9–10.3)
Chloride: 102 mmol/L (ref 101–111)
Creatinine, Ser: 0.8 mg/dL (ref 0.61–1.24)
GFR calc Af Amer: >60 mL/min (ref >60)
GFR calc non Af Amer: >60 mL/min (ref >60)
Glucose, Bld: 89 mg/dL (ref 65–99)
Potassium: 4.8 mmol/L (ref 3.5–5.1)
Sodium: 140 mmol/L (ref 135–145)
Total Bilirubin: 0.5 mg/dL (ref 0.3–1.2)
Total Protein: 5.9 g/dL — ABNORMAL LOW (ref 6.5–8.1)

## 2015-12-20 LAB — PROTIME-INR
INR: 1.22 (ref 0.00–1.49)
Prothrombin Time: 15.5 seconds — ABNORMAL HIGH (ref 11.6–15.2)

## 2015-12-20 LAB — TYPE AND SCREEN
ABO/RH(D): O NEG
Antibody Screen: NEGATIVE

## 2015-12-20 LAB — APTT: aPTT: 31 seconds (ref 24–37)

## 2015-12-20 SURGERY — AMPUTATION BELOW KNEE
Anesthesia: General | Site: Leg Lower | Laterality: Bilateral

## 2015-12-20 MED ORDER — EPHEDRINE SULFATE 50 MG/ML IJ SOLN
INTRAMUSCULAR | Status: AC
  Filled 2015-12-20: qty 1

## 2015-12-20 MED ORDER — PHENYLEPHRINE HCL 10 MG/ML IJ SOLN
INTRAMUSCULAR | Status: DC | PRN
  Administered 2015-12-20: 80 ug via INTRAVENOUS
  Administered 2015-12-20: 40 ug via INTRAVENOUS
  Administered 2015-12-20: 80 ug via INTRAVENOUS
  Administered 2015-12-20: 40 ug via INTRAVENOUS
  Administered 2015-12-20: 80 ug via INTRAVENOUS
  Administered 2015-12-20 (×2): 40 ug via INTRAVENOUS

## 2015-12-20 MED ORDER — ACETAMINOPHEN 650 MG RE SUPP: 325.0000 mg | RECTAL | Status: DC | PRN

## 2015-12-20 MED ORDER — LACTATED RINGERS IV SOLN
INTRAVENOUS | Status: DC
  Administered 2015-12-20 (×2): via INTRAVENOUS

## 2015-12-20 MED ORDER — METOPROLOL TARTRATE 1 MG/ML IV SOLN
2.0000 mg | INTRAVENOUS | Status: DC | PRN
Start: 2015-12-20 — End: 2015-12-23

## 2015-12-20 MED ORDER — SUGAMMADEX SODIUM 200 MG/2ML IV SOLN
INTRAVENOUS | Status: DC | PRN
  Administered 2015-12-20: 150 mg via INTRAVENOUS

## 2015-12-20 MED ORDER — PROPOFOL 10 MG/ML IV BOLUS
INTRAVENOUS | Status: DC | PRN
  Administered 2015-12-20: 120 mg via INTRAVENOUS

## 2015-12-20 MED ORDER — MIDAZOLAM HCL 2 MG/2ML IJ SOLN
INTRAMUSCULAR | Status: AC
  Filled 2015-12-20: qty 2

## 2015-12-20 MED ORDER — PANTOPRAZOLE SODIUM 40 MG PO TBEC
40.0000 mg | DELAYED_RELEASE_TABLET | Freq: Every day | ORAL | Status: DC
  Administered 2015-12-20 – 2015-12-23 (×4): 40 mg via ORAL
  Filled 2015-12-20 (×4): qty 1

## 2015-12-20 MED ORDER — BUPIVACAINE-EPINEPHRINE (PF) 0.25% -1:200000 IJ SOLN
INTRAMUSCULAR | Status: DC | PRN
  Administered 2015-12-20: 11 mL via PERINEURAL
  Administered 2015-12-20: 9 mL via PERINEURAL

## 2015-12-20 MED ORDER — LIDOCAINE HCL (CARDIAC) 20 MG/ML IV SOLN
INTRAVENOUS | Status: AC
  Filled 2015-12-20: qty 5

## 2015-12-20 MED ORDER — PROPOFOL 10 MG/ML IV BOLUS
INTRAVENOUS | Status: AC
  Filled 2015-12-20: qty 20

## 2015-12-20 MED ORDER — ROCURONIUM BROMIDE 50 MG/5ML IV SOLN
INTRAVENOUS | Status: AC
  Filled 2015-12-20: qty 1

## 2015-12-20 MED ORDER — PHENOL 1.4 % MT LIQD
1.0000 | OROMUCOSAL | Status: DC | PRN
Start: 2015-12-20 — End: 2015-12-23

## 2015-12-20 MED ORDER — HYDROMORPHONE HCL 1 MG/ML IJ SOLN: 0.2500 mg | INTRAMUSCULAR | Status: DC | PRN

## 2015-12-20 MED ORDER — PHENYLEPHRINE HCL 10 MG/ML IJ SOLN
10.0000 mg | INTRAVENOUS | Status: DC | PRN
  Administered 2015-12-20: 50 ug/min via INTRAVENOUS

## 2015-12-20 MED ORDER — PHENYLEPHRINE 40 MCG/ML (10ML) SYRINGE FOR IV PUSH (FOR BLOOD PRESSURE SUPPORT)
PREFILLED_SYRINGE | INTRAVENOUS | Status: AC
  Filled 2015-12-20: qty 10

## 2015-12-20 MED ORDER — HYDROMORPHONE HCL 1 MG/ML IJ SOLN
INTRAMUSCULAR | Status: DC
Start: 2015-12-20 — End: 2015-12-20
  Filled 2015-12-20: qty 2

## 2015-12-20 MED ORDER — SODIUM CHLORIDE 0.9 % IJ SOLN
INTRAMUSCULAR | Status: AC
  Filled 2015-12-20: qty 10

## 2015-12-20 MED ORDER — ACETAMINOPHEN 325 MG PO TABS
325.0000 mg | ORAL_TABLET | ORAL | Status: DC | PRN
  Administered 2015-12-22: 650 mg via ORAL
  Filled 2015-12-20 (×2): qty 2

## 2015-12-20 MED ORDER — DEXMEDETOMIDINE HCL IN NACL 200 MCG/50ML IV SOLN
INTRAVENOUS | Status: AC
  Filled 2015-12-20: qty 50

## 2015-12-20 MED ORDER — LIDOCAINE HCL (CARDIAC) 20 MG/ML IV SOLN
INTRAVENOUS | Status: DC | PRN
  Administered 2015-12-20: 60 mg via INTRAVENOUS

## 2015-12-20 MED ORDER — SUGAMMADEX SODIUM 200 MG/2ML IV SOLN
INTRAVENOUS | Status: AC
  Filled 2015-12-20: qty 2

## 2015-12-20 MED ORDER — MIDAZOLAM HCL 5 MG/5ML IJ SOLN
INTRAMUSCULAR | Status: DC | PRN
  Administered 2015-12-20: 2 mg via INTRAVENOUS

## 2015-12-20 MED ORDER — SUCCINYLCHOLINE CHLORIDE 20 MG/ML IJ SOLN
INTRAMUSCULAR | Status: AC
  Filled 2015-12-20: qty 1

## 2015-12-20 MED ORDER — CEFAZOLIN SODIUM 1-5 GM-% IV SOLN
1.0000 g | Freq: Three times a day (TID) | INTRAVENOUS | Status: DC
  Administered 2015-12-20 – 2015-12-23 (×8): 1 g via INTRAVENOUS
  Filled 2015-12-20 (×10): qty 50

## 2015-12-20 MED ORDER — FENTANYL CITRATE (PF) 250 MCG/5ML IJ SOLN
INTRAMUSCULAR | Status: AC
  Filled 2015-12-20: qty 5

## 2015-12-20 MED ORDER — HYDROMORPHONE HCL 1 MG/ML IJ SOLN: 0.5000 mg | INTRAMUSCULAR | Status: DC | PRN

## 2015-12-20 MED ORDER — CEFAZOLIN SODIUM-DEXTROSE 2-3 GM-% IV SOLR: 2.0000 g | Freq: Three times a day (TID) | INTRAVENOUS | Status: DC

## 2015-12-20 MED ORDER — LABETALOL HCL 5 MG/ML IV SOLN
10.0000 mg | INTRAVENOUS | Status: DC | PRN
  Filled 2015-12-20: qty 4

## 2015-12-20 MED ORDER — GLYCOPYRROLATE 0.2 MG/ML IJ SOLN
INTRAMUSCULAR | Status: AC
  Filled 2015-12-20: qty 1

## 2015-12-20 MED ORDER — FENTANYL CITRATE (PF) 100 MCG/2ML IJ SOLN
INTRAMUSCULAR | Status: DC | PRN
  Administered 2015-12-20: 100 ug via INTRAVENOUS

## 2015-12-20 MED ORDER — 0.9 % SODIUM CHLORIDE (POUR BTL) OPTIME
TOPICAL | Status: DC | PRN
  Administered 2015-12-20 (×2): 1000 mL

## 2015-12-20 MED ORDER — GUAIFENESIN-DM 100-10 MG/5ML PO SYRP: 15.0000 mL | ORAL_SOLUTION | ORAL | Status: DC | PRN

## 2015-12-20 MED ORDER — OXYCODONE-ACETAMINOPHEN 5-325 MG PO TABS: 1.0000 | ORAL_TABLET | ORAL | Status: DC | PRN

## 2015-12-20 MED ORDER — ONDANSETRON HCL 4 MG/2ML IJ SOLN: 4.0000 mg | Freq: Four times a day (QID) | INTRAMUSCULAR | Status: DC | PRN

## 2015-12-20 MED ORDER — ALUM & MAG HYDROXIDE-SIMETH 200-200-20 MG/5ML PO SUSP: 15.0000 mL | ORAL | Status: DC | PRN

## 2015-12-20 MED ORDER — BUPIVACAINE-EPINEPHRINE (PF) 0.25% -1:200000 IJ SOLN
INTRAMUSCULAR | Status: AC
  Filled 2015-12-20: qty 60

## 2015-12-20 MED ORDER — ENOXAPARIN SODIUM 40 MG/0.4ML ~~LOC~~ SOLN
40.0000 mg | SUBCUTANEOUS | Status: DC
  Administered 2015-12-21 – 2015-12-23 (×3): 40 mg via SUBCUTANEOUS
  Filled 2015-12-20 (×3): qty 0.4

## 2015-12-20 MED ORDER — ONDANSETRON HCL 4 MG/2ML IJ SOLN
INTRAMUSCULAR | Status: DC | PRN
  Administered 2015-12-20: 4 mg via INTRAVENOUS

## 2015-12-20 MED ORDER — ONDANSETRON HCL 4 MG/2ML IJ SOLN
INTRAMUSCULAR | Status: AC
  Filled 2015-12-20: qty 2

## 2015-12-20 MED ORDER — HYDRALAZINE HCL 20 MG/ML IJ SOLN: 5.0000 mg | INTRAMUSCULAR | Status: DC | PRN

## 2015-12-20 MED ORDER — ROCURONIUM BROMIDE 100 MG/10ML IV SOLN
INTRAVENOUS | Status: DC | PRN
  Administered 2015-12-20: 40 mg via INTRAVENOUS

## 2015-12-20 SURGICAL SUPPLY — 73 items
BANDAGE ELASTIC 4 VELCRO ST LF (GAUZE/BANDAGES/DRESSINGS) ×3 IMPLANT
BANDAGE ELASTIC 6 VELCRO ST LF (GAUZE/BANDAGES/DRESSINGS) ×6 IMPLANT
BANDAGE ESMARK 6X9 LF (GAUZE/BANDAGES/DRESSINGS) IMPLANT
BLADE SAW SAG 73X25 THK (BLADE) ×2
BLADE SAW SGTL 73X25 THK (BLADE) ×1 IMPLANT
BLADE SAW SGTL 81X20 HD (BLADE) ×3 IMPLANT
BLADE SURG 10 STRL SS (BLADE) ×7 IMPLANT
BNDG CMPR 9X6 STRL LF SNTH (GAUZE/BANDAGES/DRESSINGS)
BNDG COHESIVE 4X5 TAN STRL (GAUZE/BANDAGES/DRESSINGS) ×5 IMPLANT
BNDG COHESIVE 6X5 TAN STRL LF (GAUZE/BANDAGES/DRESSINGS) ×5 IMPLANT
BNDG ESMARK 6X9 LF (GAUZE/BANDAGES/DRESSINGS)
BNDG GAUZE ELAST 4 BULKY (GAUZE/BANDAGES/DRESSINGS) ×3 IMPLANT
BRUSH SCRUB DISP (MISCELLANEOUS) ×12 IMPLANT
COVER MAYO STAND STRL (DRAPES) ×3 IMPLANT
COVER SURGICAL LIGHT HANDLE (MISCELLANEOUS) ×4 IMPLANT
CUFF TOURNIQUET SINGLE 34IN LL (TOURNIQUET CUFF) IMPLANT
CUFF TOURNIQUET SINGLE 44IN (TOURNIQUET CUFF) IMPLANT
DRAIN PENROSE 1/2X12 LTX STRL (WOUND CARE) IMPLANT
DRAPE EXTREMITY T 121X128X90 (DRAPE) ×3 IMPLANT
DRAPE PROXIMA HALF (DRAPES) ×6 IMPLANT
DRAPE U-SHAPE 47X51 STRL (DRAPES) ×5 IMPLANT
DRSG ADAPTIC 3X8 NADH LF (GAUZE/BANDAGES/DRESSINGS) ×3 IMPLANT
DRSG MEPITEL 4X7.2 (GAUZE/BANDAGES/DRESSINGS) ×6 IMPLANT
DRSG PAD ABDOMINAL 8X10 ST (GAUZE/BANDAGES/DRESSINGS) ×3 IMPLANT
ELECT CAUTERY BLADE 6.4 (BLADE) ×6 IMPLANT
EVACUATOR 1/8 PVC DRAIN (DRAIN) IMPLANT
GAUZE SPONGE 4X4 12PLY STRL (GAUZE/BANDAGES/DRESSINGS) ×3 IMPLANT
GLOVE BIO SURGEON STRL SZ7.5 (GLOVE) ×3 IMPLANT
GLOVE BIO SURGEON STRL SZ8 (GLOVE) ×6 IMPLANT
GLOVE BIOGEL PI IND STRL 7.5 (GLOVE) ×1 IMPLANT
GLOVE BIOGEL PI IND STRL 8 (GLOVE) ×1 IMPLANT
GLOVE BIOGEL PI INDICATOR 7.5 (GLOVE) ×2
GLOVE BIOGEL PI INDICATOR 8 (GLOVE) ×2
GOWN STRL REUS W/ TWL LRG LVL3 (GOWN DISPOSABLE) ×4 IMPLANT
GOWN STRL REUS W/ TWL XL LVL3 (GOWN DISPOSABLE) ×1 IMPLANT
GOWN STRL REUS W/TWL LRG LVL3 (GOWN DISPOSABLE) ×12
GOWN STRL REUS W/TWL XL LVL3 (GOWN DISPOSABLE) ×3
KIT ROOM TURNOVER OR (KITS) ×3 IMPLANT
MANIFOLD NEPTUNE II (INSTRUMENTS) ×1 IMPLANT
NEEDLE 22X1 1/2 (OR ONLY) (NEEDLE) ×3 IMPLANT
NS IRRIG 1000ML POUR BTL (IV SOLUTION) ×3 IMPLANT
PACK GENERAL/GYN (CUSTOM PROCEDURE TRAY) ×3 IMPLANT
PAD ARMBOARD 7.5X6 YLW CONV (MISCELLANEOUS) ×6 IMPLANT
PAD CAST 4YDX4 CTTN HI CHSV (CAST SUPPLIES) ×2 IMPLANT
PADDING CAST COTTON 4X4 STRL (CAST SUPPLIES) ×6
PENCIL BUTTON HOLSTER BLD 10FT (ELECTRODE) ×3 IMPLANT
SPONGE GAUZE 4X4 12PLY STER LF (GAUZE/BANDAGES/DRESSINGS) ×6 IMPLANT
SPONGE LAP 18X18 X RAY DECT (DISPOSABLE) IMPLANT
STAPLER VISISTAT 35W (STAPLE) IMPLANT
STOCKINETTE IMPERVIOUS LG (DRAPES) ×6 IMPLANT
SUT ETHILON 2 0 FS 18 (SUTURE) IMPLANT
SUT ETHILON 2 0 PSLX (SUTURE) ×8 IMPLANT
SUT PDS AB 2-0 CT1 27 (SUTURE) IMPLANT
SUT PROLENE 0 CT 1 30 (SUTURE) ×2 IMPLANT
SUT PROLENE 2 0 CT 1 (SUTURE) ×6 IMPLANT
SUT SILK 2 0 (SUTURE) ×3
SUT SILK 2 0 SH CR/8 (SUTURE) IMPLANT
SUT SILK 2-0 18XBRD TIE 12 (SUTURE) IMPLANT
SUT VIC AB 0 CT1 27 (SUTURE)
SUT VIC AB 0 CT1 27XBRD ANBCTR (SUTURE) IMPLANT
SUT VIC AB 1 CT1 27 (SUTURE) ×9
SUT VIC AB 1 CT1 27XBRD ANBCTR (SUTURE) IMPLANT
SUT VIC AB 2-0 CT1 27 (SUTURE) ×18
SUT VIC AB 2-0 CT1 TAPERPNT 27 (SUTURE) ×6 IMPLANT
SUT VICRYL AB 2 0 TIES (SUTURE) IMPLANT
SWAB COLLECTION DEVICE MRSA (MISCELLANEOUS) IMPLANT
SYR 30ML LL (SYRINGE) ×2 IMPLANT
TOWEL OR 17X24 6PK STRL BLUE (TOWEL DISPOSABLE) ×3 IMPLANT
TOWEL OR 17X26 10 PK STRL BLUE (TOWEL DISPOSABLE) ×6 IMPLANT
TUBE ANAEROBIC SPECIMEN COL (MISCELLANEOUS) IMPLANT
TUBE CONNECTING 12'X1/4 (SUCTIONS) ×1
TUBE CONNECTING 12X1/4 (SUCTIONS) ×2 IMPLANT
WATER STERILE IRR 1000ML POUR (IV SOLUTION) ×3 IMPLANT

## 2015-12-20 NOTE — Progress Notes (Signed)
Utilization review complete. Jamya Starry RN CCM Case Mgmt phone [phone] 

## 2015-12-20 NOTE — Progress Notes (Signed)
[place] PA and  [employer] rep placing plastic splints on pt's LE's

## 2015-12-20 NOTE — Anesthesia Procedure Notes (Signed)
Procedure Name: Intubation Date/Time: 12/20/2015 8:35 AM Performed by: Lovie Chol Pre-anesthesia Checklist: Patient identified, Emergency Drugs available, Suction available, Patient being monitored and Timeout performed Patient Re-evaluated:Patient Re-evaluated prior to inductionOxygen Delivery Method: Circle system utilized Preoxygenation: Pre-oxygenation with 100% oxygen Intubation Type: IV induction Ventilation: Mask ventilation without difficulty Laryngoscope Size: Mac and 4 Grade View: Grade I Tube type: Oral Tube size: 7.5 mm Number of attempts: 1 Airway Equipment and Method: Stylet Placement Confirmation: ETT inserted through vocal cords under direct vision,  positive ETCO2,  CO2 detector and breath sounds checked- equal and bilateral Secured at: 23 cm Tube secured with: Tape Dental Injury: Teeth and Oropharynx as per pre-operative assessment

## 2015-12-20 NOTE — Transfer of Care (Signed)
Immediate Anesthesia Transfer of Care Note  Patient: [name]  Procedure(s) Performed: Procedure(s): AMPUTATION BILATERAL BELOW KNEE (Bilateral)  Patient Location: PACU  Anesthesia Type:General  Level of Consciousness: awake, oriented and patient cooperative  Airway & Oxygen Therapy: Patient Spontanous Breathing and Patient connected to nasal cannula oxygen  Post-op Assessment: Report given to RN and Post -op Vital signs reviewed and stable  Post vital signs: Reviewed  Last Vitals:  Filed Vitals:   12/20/15 0656 12/20/15 1138  BP: 107/69   Pulse: 85   Temp: 36.7 C 36.9 C  Resp: 16     Complications: No apparent anesthesia complications

## 2015-12-20 NOTE — Progress Notes (Signed)
No new questions.   There remains complete consensus on proceeding with the recommended bilateral BKA's.  I have discussed with the patient and his sister, [name], the risks and benefits of surgery, including the possibility of infection, nerve injury, vessel injury, wound breakdown, arthritis, DVT/ PE, loss of motion, and need for further surgery among others. They understood these risks and wish to proceed.   Budd Palmer, MD 12/20/2015
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
## 2015-12-20 NOTE — Anesthesia Postprocedure Evaluation (Signed)
Anesthesia Post Note  Patient: [name]  Procedure(s) Performed: Procedure(s) (LRB): AMPUTATION BILATERAL BELOW KNEE (Bilateral)  Patient location during evaluation: PACU Anesthesia Type: General Level of consciousness: awake and alert Pain management: pain level controlled Vital Signs Assessment: post-procedure vital signs reviewed and stable Respiratory status: spontaneous breathing, nonlabored ventilation and respiratory function stable Cardiovascular status: blood pressure returned to baseline and stable Postop Assessment: no signs of nausea or vomiting Anesthetic complications: no    Last Vitals:  Filed Vitals:   12/20/15 1148 12/20/15 1205  BP:  149/92  Pulse: 88 80  Temp:  37 C  Resp: 21 20    Last Pain:  Filed Vitals:   12/20/15 1206  PainSc: 0-No pain                 See Beharry,W. EDMOND
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
## 2015-12-21 ENCOUNTER — Encounter (HOSPITAL_COMMUNITY): Payer: Self-pay | Admitting: Orthopedic Surgery

## 2015-12-21 DIAGNOSIS — L8990 Pressure ulcer of unspecified site, unspecified stage: Secondary | ICD-10-CM

## 2015-12-21 DIAGNOSIS — S8291XA Unspecified fracture of right lower leg, initial encounter for closed fracture: Secondary | ICD-10-CM

## 2015-12-21 DIAGNOSIS — R532 Functional quadriplegia: Secondary | ICD-10-CM

## 2015-12-21 LAB — CBC
HCT: 27.1 % — ABNORMAL LOW (ref 39.0–52.0)
Hemoglobin: 8.3 g/dL — ABNORMAL LOW (ref 13.0–17.0)
MCH: 29.4 pg (ref 26.0–34.0)
MCHC: 30.6 g/dL (ref 30.0–36.0)
MCV: 96.1 fL (ref 78.0–100.0)
Platelets: 692 10*3/uL — ABNORMAL HIGH (ref 150–400)
RBC: 2.82 MIL/uL — ABNORMAL LOW (ref 4.22–5.81)
RDW: 15.2 % (ref 11.5–15.5)
WBC: 11.7 10*3/uL — ABNORMAL HIGH (ref 4.0–10.5)

## 2015-12-21 LAB — BASIC METABOLIC PANEL
Anion gap: 9 (ref 5–15)
BUN: 10 mg/dL (ref 6–20)
CO2: 29 mmol/L (ref 22–32)
Calcium: 8.4 mg/dL — ABNORMAL LOW (ref 8.9–10.3)
Chloride: 102 mmol/L (ref 101–111)
Creatinine, Ser: 0.77 mg/dL (ref 0.61–1.24)
GFR calc Af Amer: >60 mL/min (ref >60)
GFR calc non Af Amer: >60 mL/min (ref >60)
Glucose, Bld: 88 mg/dL (ref 65–99)
Potassium: 4.5 mmol/L (ref 3.5–5.1)
Sodium: 140 mmol/L (ref 135–145)

## 2015-12-21 MED ORDER — BACLOFEN 10 MG PO TABS
10.0000 mg | ORAL_TABLET | Freq: Two times a day (BID) | ORAL | Status: DC
  Administered 2015-12-21 – 2015-12-23 (×4): 10 mg via ORAL
  Filled 2015-12-21 (×4): qty 1

## 2015-12-21 NOTE — Care Management Important Message (Signed)
Important Message  Patient Details  Name: [name] MRN: [id number] Date of Birth: 1971-06-08   Medicare Important Message Given:  Yes    Prudy Candy P Velta Rockholt 12/21/2015, 3:48 PM
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
## 2015-12-21 NOTE — Progress Notes (Signed)
Patient's temp tonight is >101 but he refused medication. Patient states that this happens every night. He reports that he like wearing his gown, sweater, then blanket. He also reports that he knows that it will be down in the morning- to check it then.
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
## 2015-12-21 NOTE — Progress Notes (Signed)
I met with pt at bedside as he sits in wheelchair. He states nursing and his brother assisted him into wheelchair. He reports he did the actual transfer himself. I await P.T. evaluation postop to assist in determining rehab needs. He states he was told he may need a day or two of therapy and then plans to return home. I will follow up in the morning. [phone]

## 2015-12-22 LAB — CBC
HCT: 25.5 % — ABNORMAL LOW (ref 39.0–52.0)
Hemoglobin: 8.2 g/dL — ABNORMAL LOW (ref 13.0–17.0)
MCH: 30.3 pg (ref 26.0–34.0)
MCHC: 32.2 g/dL (ref 30.0–36.0)
MCV: 94.1 fL (ref 78.0–100.0)
Platelets: 651 10*3/uL — ABNORMAL HIGH (ref 150–400)
RBC: 2.71 MIL/uL — ABNORMAL LOW (ref 4.22–5.81)
RDW: 15.1 % (ref 11.5–15.5)
WBC: 14.5 10*3/uL — ABNORMAL HIGH (ref 4.0–10.5)

## 2015-12-22 LAB — BASIC METABOLIC PANEL
Anion gap: 12 (ref 5–15)
BUN: 13 mg/dL (ref 6–20)
CO2: 26 mmol/L (ref 22–32)
Calcium: 8.5 mg/dL — ABNORMAL LOW (ref 8.9–10.3)
Chloride: 100 mmol/L — ABNORMAL LOW (ref 101–111)
Creatinine, Ser: 0.84 mg/dL (ref 0.61–1.24)
GFR calc Af Amer: >60 mL/min (ref >60)
GFR calc non Af Amer: >60 mL/min (ref >60)
Glucose, Bld: 96 mg/dL (ref 65–99)
Potassium: 4.7 mmol/L (ref 3.5–5.1)
Sodium: 138 mmol/L (ref 135–145)
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
## 2015-12-22 NOTE — Progress Notes (Addendum)
The patient well known to me from the outpatient physical medicine and rehabilitation clinic. Patient did not recognize me although with cueing he remembered was one of his doctors. Have reviewed flow sheet as well as labs.during the last 3 nights and evenings his T Max has ranged from 100.8-101.6. A.m. Temperature 100.1 this morning. White blood cell trend CBC Latest Ref Rng 12/22/2015 12/21/2015 12/20/2015  WBC 4.0 - 10.5 K/uL 14.5(H) 11.7(H) 12.7(H)  Hemoglobin 13.0 - 17.0 g/dL 8.2(L) 8.3(L) 7.9(L)  Hematocrit 39.0 - 52.0 % 25.5(L) 27.1(L) 25.4(L)  Platelets 150 - 400 K/uL 651(H) 692(H) 677(H)   Concern that despite being on IV antibiotics this patient is spiking temps and has elevation of white blood cell count.  Will need some further workup, evaluation prior to admission to inpatient rehabilitation.   Thanks Erick Colace M.D. [facility] FAAPM&R (Sports Med, Neuromuscular Med) Diplomate Am Board of Electrodiagnostic Med

## 2015-12-22 NOTE — Progress Notes (Signed)
Patient refused labs this morning. Asked to reschedule.

## 2015-12-22 NOTE — Progress Notes (Signed)
Dr. Carola Frost notified that pt was denied rehab at this time due to WBC 14.5 and pt was returned to [id number].

## 2015-12-22 NOTE — Progress Notes (Signed)
Patient has been tachy for me this shift- states that it is because he is hot in the room sweating but prefers it that way. RN asked patient about removing sweater and blanket- he states this always happens and that he is fine.

## 2015-12-22 NOTE — Progress Notes (Signed)
I met with pt to discuss his potential rehab needs. Barbaraann Rondo, CIR Therapy Supervisor, will meet with pt and myself at 11 to review wheelchair needs and discuss rehab. RN aware of plan. [phone]

## 2015-12-22 NOTE — Progress Notes (Signed)
Rehab admissions - Received message from Dr. Wynn Banker that he has cancelled the admission to rehab for today due to patient's fever and elevated WBC count.  Rehab MD feels patient needs workup for fever.  I will follow up tomorrow.  Call me for questions.  #[phone]
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
## 2015-12-22 NOTE — Care Management Note (Signed)
Case Management Note  Patient Details  Name: [name] MRN: [id number] Date of Birth: 27-Sep-1971  Subjective/Objective:                    Action/Plan:   Expected Discharge Date:                  Expected Discharge Plan:  IP Rehab Facility  In-House Referral:     Discharge planning Services  CM Consult  Post Acute Care Choice:    Choice offered to:     DME Arranged:    DME Agency:     HH Arranged:    HH Agency:     Status of Service:  In process, will continue to follow  Medicare Important Message Given:  Yes Date Medicare IM Given:    Medicare IM give by:    Date Additional Medicare IM Given:    Additional Medicare Important Message give by:     If discussed at Long Length of Stay Meetings, dates discussed:  12-22-15   Additional Comments: UR updated  Kingsley Plan, RN 12/22/2015, 10:01 AM

## 2015-12-22 NOTE — Progress Notes (Signed)
I met with pt and CIR therapy supervisor, Barbaraann Rondo. Pt is in agreement to a 5-7 day rehab stay. I have contacted Ainsley Spinner PA by phone and we will make arrangements to d/c pt to inpt rehab today. I have notified RN [place]. [phone]
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
## 2015-12-23 ENCOUNTER — Inpatient Hospital Stay (HOSPITAL_COMMUNITY)
Admission: AD | Admit: 2015-12-23 | Discharge: 2015-12-30 | DRG: 559 | Disposition: A | Discharge status: Home Health | Payer: Medicare Other | Source: Intra-hospital | Attending: Physical Medicine & Rehabilitation | Admitting: Physical Medicine & Rehabilitation

## 2015-12-23 ENCOUNTER — Inpatient Hospital Stay (HOSPITAL_COMMUNITY): Payer: Medicare Other

## 2015-12-23 DIAGNOSIS — Z89511 Acquired absence of right leg below knee: Secondary | ICD-10-CM

## 2015-12-23 DIAGNOSIS — N319 Neuromuscular dysfunction of bladder, unspecified: Secondary | ICD-10-CM | POA: Diagnosis present

## 2015-12-23 DIAGNOSIS — N390 Urinary tract infection, site not specified: Secondary | ICD-10-CM

## 2015-12-23 DIAGNOSIS — Z993 Dependence on wheelchair: Secondary | ICD-10-CM

## 2015-12-23 DIAGNOSIS — Z79899 Other long term (current) drug therapy: Secondary | ICD-10-CM

## 2015-12-23 DIAGNOSIS — F17210 Nicotine dependence, cigarettes, uncomplicated: Secondary | ICD-10-CM

## 2015-12-23 DIAGNOSIS — S82202B Unspecified fracture of shaft of left tibia, initial encounter for open fracture type I or II: Secondary | ICD-10-CM | POA: Diagnosis present

## 2015-12-23 DIAGNOSIS — R252 Cramp and spasm: Secondary | ICD-10-CM

## 2015-12-23 DIAGNOSIS — G8250 Quadriplegia, unspecified: Secondary | ICD-10-CM | POA: Diagnosis not present

## 2015-12-23 DIAGNOSIS — B3749 Other urogenital candidiasis: Secondary | ICD-10-CM

## 2015-12-23 DIAGNOSIS — S82201S Unspecified fracture of shaft of right tibia, sequela: Secondary | ICD-10-CM

## 2015-12-23 DIAGNOSIS — Z4781 Encounter for orthopedic aftercare following surgical amputation: Principal | ICD-10-CM

## 2015-12-23 DIAGNOSIS — S82202S Unspecified fracture of shaft of left tibia, sequela: Secondary | ICD-10-CM

## 2015-12-23 DIAGNOSIS — Z89512 Acquired absence of left leg below knee: Secondary | ICD-10-CM

## 2015-12-23 DIAGNOSIS — K592 Neurogenic bowel, not elsewhere classified: Secondary | ICD-10-CM | POA: Diagnosis present

## 2015-12-23 DIAGNOSIS — D62 Acute posthemorrhagic anemia: Secondary | ICD-10-CM

## 2015-12-23 DIAGNOSIS — A499 Bacterial infection, unspecified: Secondary | ICD-10-CM

## 2015-12-23 DIAGNOSIS — S82201B Unspecified fracture of shaft of right tibia, initial encounter for open fracture type I or II: Secondary | ICD-10-CM | POA: Diagnosis present

## 2015-12-23 DIAGNOSIS — B9689 Other specified bacterial agents as the cause of diseases classified elsewhere: Secondary | ICD-10-CM

## 2015-12-23 DIAGNOSIS — G40909 Epilepsy, unspecified, not intractable, without status epilepticus: Secondary | ICD-10-CM | POA: Diagnosis not present

## 2015-12-23 DIAGNOSIS — R561 Post traumatic seizures: Secondary | ICD-10-CM

## 2015-12-23 HISTORY — DX: Acquired absence of left leg below knee: Z89.512

## 2015-12-23 HISTORY — DX: Acquired absence of right leg below knee: Z89.511

## 2015-12-23 LAB — URINALYSIS, ROUTINE W REFLEX MICROSCOPIC
Bilirubin Urine: NEGATIVE
Glucose, UA: NEGATIVE mg/dL
Hgb urine dipstick: NEGATIVE
Ketones, ur: NEGATIVE mg/dL
Nitrite: NEGATIVE
Protein, ur: NEGATIVE mg/dL
Specific Gravity, Urine: 1.019 (ref 1.005–1.030)
pH: 6 (ref 5.0–8.0)

## 2015-12-23 LAB — URINE MICROSCOPIC-ADD ON: RBC / HPF: NONE SEEN RBC/hpf (ref 0–5)

## 2015-12-23 LAB — COMPREHENSIVE METABOLIC PANEL
ALT: 11 U/L — ABNORMAL LOW (ref 17–63)
AST: 36 U/L (ref 15–41)
Albumin: 1.7 g/dL — ABNORMAL LOW (ref 3.5–5.0)
Alkaline Phosphatase: 56 U/L (ref 38–126)
Anion gap: 12 (ref 5–15)
BUN: 19 mg/dL (ref 6–20)
CO2: 25 mmol/L (ref 22–32)
Calcium: 7.9 mg/dL — ABNORMAL LOW (ref 8.9–10.3)
Chloride: 96 mmol/L — ABNORMAL LOW (ref 101–111)
Creatinine, Ser: 0.87 mg/dL (ref 0.61–1.24)
GFR calc Af Amer: >60 mL/min (ref >60)
GFR calc non Af Amer: >60 mL/min (ref >60)
Glucose, Bld: 101 mg/dL — ABNORMAL HIGH (ref 65–99)
Potassium: 3.8 mmol/L (ref 3.5–5.1)
Sodium: 133 mmol/L — ABNORMAL LOW (ref 135–145)
Total Bilirubin: 0.4 mg/dL (ref 0.3–1.2)
Total Protein: 6.6 g/dL (ref 6.5–8.1)

## 2015-12-23 LAB — CBC
HCT: 24.8 % — ABNORMAL LOW (ref 39.0–52.0)
Hemoglobin: 7.6 g/dL — ABNORMAL LOW (ref 13.0–17.0)
MCH: 28.1 pg (ref 26.0–34.0)
MCHC: 30.6 g/dL (ref 30.0–36.0)
MCV: 91.9 fL (ref 78.0–100.0)
Platelets: 685 10*3/uL — ABNORMAL HIGH (ref 150–400)
RBC: 2.7 MIL/uL — ABNORMAL LOW (ref 4.22–5.81)
RDW: 14.3 % (ref 11.5–15.5)
WBC: 12.4 10*3/uL — ABNORMAL HIGH (ref 4.0–10.5)

## 2015-12-23 MED ORDER — DIVALPROEX SODIUM ER 500 MG PO TB24
500.0000 mg | ORAL_TABLET | Freq: Two times a day (BID) | ORAL | Status: DC
  Administered 2015-12-23 – 2015-12-30 (×14): 500 mg via ORAL
  Filled 2015-12-23 (×15): qty 1

## 2015-12-23 MED ORDER — OXYCODONE-ACETAMINOPHEN 5-325 MG PO TABS
1.0000 | ORAL_TABLET | ORAL | Status: DC | PRN
  Administered 2015-12-24: 1 via ORAL
  Administered 2015-12-24: 2 via ORAL
  Filled 2015-12-23: qty 2
  Filled 2015-12-23 (×2): qty 1

## 2015-12-23 MED ORDER — FLUCONAZOLE 200 MG PO TABS
200.0000 mg | ORAL_TABLET | Freq: Every day | ORAL | Status: DC
  Administered 2015-12-24 – 2015-12-26 (×3): 200 mg via ORAL
  Filled 2015-12-23 (×3): qty 1

## 2015-12-23 MED ORDER — ACETAMINOPHEN 325 MG PO TABS: 325.0000 mg | ORAL_TABLET | ORAL | Status: DC | PRN

## 2015-12-23 MED ORDER — ENOXAPARIN SODIUM 40 MG/0.4ML ~~LOC~~ SOLN: 40.0000 mg | SUBCUTANEOUS | Status: DC

## 2015-12-23 MED ORDER — ACETAMINOPHEN 650 MG RE SUPP: 325.0000 mg | RECTAL | Status: DC | PRN

## 2015-12-23 MED ORDER — PANTOPRAZOLE SODIUM 40 MG PO TBEC
40.0000 mg | DELAYED_RELEASE_TABLET | Freq: Every day | ORAL | Status: DC
  Administered 2015-12-24 – 2015-12-30 (×7): 40 mg via ORAL
  Filled 2015-12-23 (×7): qty 1

## 2015-12-23 MED ORDER — DOCUSATE SODIUM 100 MG PO CAPS
100.0000 mg | ORAL_CAPSULE | Freq: Two times a day (BID) | ORAL | Status: DC
  Administered 2015-12-23 – 2015-12-30 (×14): 100 mg via ORAL
  Filled 2015-12-23 (×14): qty 1

## 2015-12-23 MED ORDER — SORBITOL 70 % SOLN: 30.0000 mL | Freq: Every day | Status: DC | PRN

## 2015-12-23 MED ORDER — BACLOFEN 10 MG PO TABS
10.0000 mg | ORAL_TABLET | Freq: Two times a day (BID) | ORAL | Status: DC
  Administered 2015-12-23 – 2015-12-24 (×2): 10 mg via ORAL
  Filled 2015-12-23 (×2): qty 1

## 2015-12-23 MED ORDER — ENOXAPARIN SODIUM 40 MG/0.4ML ~~LOC~~ SOLN
40.0000 mg | SUBCUTANEOUS | Status: DC
  Administered 2015-12-24 – 2015-12-29 (×5): 40 mg via SUBCUTANEOUS
  Filled 2015-12-23 (×6): qty 0.4

## 2015-12-23 MED ORDER — FLUCONAZOLE 100 MG PO TABS
200.0000 mg | ORAL_TABLET | Freq: Every day | ORAL | Status: DC
  Administered 2015-12-23: 200 mg via ORAL
  Filled 2015-12-23: qty 2

## 2015-12-23 MED ORDER — ONDANSETRON HCL 4 MG/2ML IJ SOLN
4.0000 mg | Freq: Four times a day (QID) | INTRAMUSCULAR | Status: DC | PRN
Start: 2015-12-23 — End: 2015-12-30

## 2015-12-23 MED ORDER — ONDANSETRON HCL 4 MG PO TABS: 4.0000 mg | ORAL_TABLET | Freq: Four times a day (QID) | ORAL | Status: DC | PRN

## 2015-12-23 NOTE — Interval H&P Note (Signed)
[name] was admitted today to Inpatient Rehabilitation with the diagnosis of s/p bilateral bka's.  The patient's history has been reviewed, patient examined, and there is no change in status.  Patient continues to be appropriate for intensive inpatient rehabilitation.  I have reviewed the patient's chart and labs.  Questions were answered to the patient's satisfaction. The PAPE has been reviewed and assessment remains appropriate.  SWARTZ,ZACHARY T 12/23/2015, 6:54 PM

## 2015-12-23 NOTE — Progress Notes (Signed)
Rehab admissions - I have talked with Dr. Riley KillSwartz and with Montez MoritaKeith Paul, PA who have cleared patient medically.  Bed available and will admit to acute inpatient rehab today.  Call me for questions.  #[phone]
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
## 2015-12-23 NOTE — Progress Notes (Signed)
Pt transferred to Rehab from 6N08 Bilateral amputee. Alert and orientated x 4. Orientated to Rehab expectations, therapy schedule, visiting hours, etc. Prior hx of C5-spinal cord injury. Pt reports that he self-manages bowel and bladder program. R BKA incisional line approximated, w/visible sutures and scant amount of serosanguinous drainage on dressing. Pin site x 2 to area, covered with Allevyn dressing. L BKA incisional line approximated, w/sutures, and scant amount of serosanguinous drainage to dressing. Visible scab to L knee, OTA. External hemorrhoids, and denuded area to L thumb and index finger.
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
## 2015-12-23 NOTE — Progress Notes (Signed)
Pt refused to have chest Xray done.

## 2015-12-23 NOTE — Progress Notes (Signed)
PT Cancellation Note  Patient Details Name: [name] [place] MRN: [id number] DOB: 09/28/1971   Cancelled Treatment:    Reason Eval/Treat Not Completed: Patient declined, no reason specified;Fatigue/lethargy limiting ability to participate Pt states "I'm not going to do it right now".  PT encouraged pt to perform out of bed activity, pt states "Maybe later, but I honestly don't feel like it now"   [name] 12/23/2015, 1:58 PM
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
## 2015-12-23 NOTE — Progress Notes (Signed)
Report called to Berna in IP rehab

## 2015-12-24 ENCOUNTER — Inpatient Hospital Stay (HOSPITAL_COMMUNITY): Payer: Medicare Other

## 2015-12-24 LAB — CBC WITH DIFFERENTIAL/PLATELET
Basophils Absolute: 0 10*3/uL (ref 0.0–0.1)
Basophils Relative: 0 %
Eosinophils Absolute: 0.1 10*3/uL (ref 0.0–0.7)
Eosinophils Relative: 0 %
HCT: 25.8 % — ABNORMAL LOW (ref 39.0–52.0)
Hemoglobin: 8.2 g/dL — ABNORMAL LOW (ref 13.0–17.0)
Lymphocytes Relative: 10 %
Lymphs Abs: 1.3 10*3/uL (ref 0.7–4.0)
MCH: 29.4 pg (ref 26.0–34.0)
MCHC: 31.8 g/dL (ref 30.0–36.0)
MCV: 92.5 fL (ref 78.0–100.0)
Monocytes Absolute: 1.7 10*3/uL — ABNORMAL HIGH (ref 0.1–1.0)
Monocytes Relative: 14 %
Neutro Abs: 9.5 10*3/uL — ABNORMAL HIGH (ref 1.7–7.7)
Neutrophils Relative %: 76 %
Platelets: 694 10*3/uL — ABNORMAL HIGH (ref 150–400)
RBC: 2.79 MIL/uL — ABNORMAL LOW (ref 4.22–5.81)
RDW: 14.5 % (ref 11.5–15.5)
WBC: 12.6 10*3/uL — ABNORMAL HIGH (ref 4.0–10.5)

## 2015-12-24 MED ORDER — BACLOFEN 10 MG PO TABS
10.0000 mg | ORAL_TABLET | Freq: Three times a day (TID) | ORAL | Status: DC
  Administered 2015-12-24 (×2): 10 mg via ORAL
  Filled 2015-12-24 (×2): qty 1

## 2015-12-24 NOTE — Progress Notes (Signed)
Physical Therapy Session Note  Patient Details  Name: Pearletha Forgeherman Virtue MRN: [id number] Date of Birth: 1970-12-07  Today's Date: 12/24/2015 PT Individual Time: 1300-1330 PT Individual Time Calculation (min): 30 min   Short Term Goals: Week 1:  PT Short Term Goal 1 (Week 1): = LTGS due to short LOS  Skilled Therapeutic Interventions/Progress Updates:   Session focused on w/c adjustment for dumping chair for improved positioning and low back support (pt's personal w/c is set-up like this also) and practiced transfers in/out of bed like this. Pt performed transfers at supervision level and discussed differences in center of gravity now with amputations. Pt reports he had a big LOB with OT earlier today and it helped him realize how different uneven transfers will be. Pt with noted impaired attention requiring redirection throughout session.    Left a message with [facility] to set-up w/c evaluation this week prior to pt's discharge.   Therapy Documentation Precautions:  Precautions Precautions: Fall Precaution Comments: spastic quadriplegia (since 1999); tone and spasms in BLE Required Braces or Orthoses: Knee Immobilizer - Right, Knee Immobilizer - Left Other Brace/Splint: All times except when performing ROM Restrictions Weight Bearing Restrictions: Yes RLE Weight Bearing: Non weight bearing LLE Weight Bearing: Non weight bearing  Pain:  Denies pain.   See Function Navigator for Current Functional Status.   Therapy/Group: Individual Therapy  Karolee StampsGray, [place] Darrol PokeBrescia  Umaiza Matusik B. Ger Nicks, PT, DPT  12/24/2015, 3:03 PM

## 2015-12-24 NOTE — Progress Notes (Signed)
Orthopedic Tech Progress Note Patient Details:  [name] January 17, 1971 [id number]  Ortho Devices Type of Ortho Device: Knee Immobilizer Ortho Device/Splint Location: bilateral Ortho Device/Splint Interventions: Application   [name] 12/24/2015, 10:57 AM
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
## 2015-12-24 NOTE — Progress Notes (Signed)
Pt with laceration rt. leg; 3 cms, 1.0 cm deep, open;  min. tan drainage, Dr Wynn BankerKirsteins made aware, saw area.  Monitor.
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
## 2015-12-25 ENCOUNTER — Inpatient Hospital Stay (HOSPITAL_COMMUNITY): Payer: Self-pay | Admitting: Physical Therapy

## 2015-12-25 LAB — URINE CULTURE: Culture: >=100000

## 2015-12-25 MED ORDER — BACLOFEN 10 MG PO TABS
10.0000 mg | ORAL_TABLET | Freq: Four times a day (QID) | ORAL | Status: DC
  Administered 2015-12-25 – 2015-12-30 (×21): 10 mg via ORAL
  Filled 2015-12-25 (×21): qty 1
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
## 2015-12-25 NOTE — Progress Notes (Signed)
Physical Therapy Session Note  Patient Details  Name: [name] MRN: [id number] Date of Birth: April 22, 1971  Today's Date: 12/25/2015 PT Individual Time: 1415-1445 PT Individual Time Calculation (min): 30 min   Short Term Goals: Week 1:  PT Short Term Goal 1 (Week 1): = LTGS due to short LOS  Skilled Therapeutic Interventions/Progress Updates:    Session limited by verbosity and verbal tangents requiring frequent redirection. Pt perseverative on toilet transfers and trying to be able to use his old w/c. Pt would benefit from commode or raised toilet seat for these transfers. Pt would continue to benefit from skilled PT services to increase functional mobility.  Therapy Documentation Precautions:  Precautions Precautions: Fall Precaution Comments: spastic quadriplegia (since 1999); tone and spasms in BLE Required Braces or Orthoses: Knee Immobilizer - Right, Knee Immobilizer - Left Other Brace/Splint: All times except when performing ROM Restrictions Weight Bearing Restrictions: Yes RLE Weight Bearing: Non weight bearing LLE Weight Bearing: Non weight bearing Vital Signs: Therapy Vitals Temp: 99.7 F (37.6 C) Temp Source: Oral Pulse Rate: 89 Resp: 18 BP: (!) 105/58 mmHg Patient Position (if appropriate): Lying Oxygen Therapy SpO2: 100 % O2 Device: Not Delivered Pain: Pain Assessment Pain Assessment: No/denies pain Mobility:  Pt performs transfers with Min A with cues for safety and technique Other Treatments:  pt educated on rehab plan, safety in mobility, rationale behind w/c recommendations, raised surfaces and AD for uneven transfers. Pt performs transfers x5 in session. Pt's verbosity managed by frequent redirection.   See Function Navigator for Current Functional Status.   Therapy/Group: Individual Therapy  Christia ReadingKinney, Joene Gelder G 12/25/2015, 2:35 PM

## 2015-12-26 ENCOUNTER — Inpatient Hospital Stay (HOSPITAL_COMMUNITY): Payer: Medicare Other

## 2015-12-26 ENCOUNTER — Inpatient Hospital Stay (HOSPITAL_COMMUNITY): Payer: Self-pay | Admitting: Occupational Therapy

## 2015-12-26 DIAGNOSIS — N390 Urinary tract infection, site not specified: Secondary | ICD-10-CM

## 2015-12-26 DIAGNOSIS — A499 Bacterial infection, unspecified: Secondary | ICD-10-CM

## 2015-12-26 MED ORDER — SULFAMETHOXAZOLE-TRIMETHOPRIM 800-160 MG PO TABS
1.0000 | ORAL_TABLET | Freq: Two times a day (BID) | ORAL | Status: DC
  Administered 2015-12-26 – 2015-12-30 (×9): 1 via ORAL
  Filled 2015-12-26 (×10): qty 1
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
## 2015-12-26 NOTE — Progress Notes (Signed)
Physical Therapy Session Note  Patient Details  Name: [name] MRN: [id number] Date of Birth: [birth year]  Today's Date: 12/26/2015 PT Individual Time: 1500-1530 PT Individual Time Calculation (min): 30 min   Short Term Goals: Week 1:  PT Short Term Goal 1 (Week 1): = LTGS due to short LOS  Skilled Therapeutic Interventions/Progress Updates:    Session focused on functional transfer training (bed<>w/c and w/c <> simulated car) with focus on safe technique, education and demonstration on limb wrapping, and d/c planning.   Pt performed bed <> w/c transfers approaching modified independent level and car transfer at supervision. Pt reports his brother will likely pick him up and put him in/out of the car if it is too uneven (what they did before) but pt able to perform level transfer or close to level transfer with supervision.   Demonstrated technique for limb wrapping but due to spasms and increased tone, pt unable to manage that and the wrap at the same time so will require further practice and problem solving. It seemed to work best if pt sat EOB with knee bent off of the bed. Left with RN to perform dressing change to R residual limb. Education on skin inspection as well when taking ACE wraps off.   Therapy Documentation Precautions:  Precautions Precautions: Fall Precaution Comments: spastic quadriplegia (since 1999); tone and spasms in BLE Required Braces or Orthoses: Knee Immobilizer - Right, Knee Immobilizer - Left Other Brace/Splint: All times except when performing ROM Restrictions Weight Bearing Restrictions: Yes RLE Weight Bearing: Non weight bearing LLE Weight Bearing: Non weight bearing  Pain:  Denies pain.    See Function Navigator for Current Functional Status.   Therapy/Group: Individual Therapy  Karolee StampsGray, Aadith Raudenbush Darrol PokeBrescia  Mikyah Alamo B. Roshonda Sperl, PT, DPT  12/26/2015, 3:43 PM

## 2015-12-26 NOTE — Progress Notes (Signed)
Occupational Therapy Session Note  Patient Details  Name: [name] MRN: [id number] Date of Birth: Oct 26, 1970  Today's Date: 12/26/2015 OT Individual Time: [id number] OT Individual Time Calculation (min): 75 min    Short Term Goals: Week 1:  OT Short Term Goal 1 (Week 1): STG=LTG d/t short LOS  Skilled Therapeutic Interventions/Progress Updates:    Pt seen for OT therapy session focusing on ADL re-training. Pt in side lying upon arrival completing bowel program. Assist provided for set-up of hygiene materials. Pt reported already completing UB bathing/dressing and LB bathing prior to tx session. Problem solving donning pants in bed. MD cleared pt not to wear B knee immobilizers at all times. Pt with spasms in B LEs, L >R bringing knees into flexion limiting pt's ability to thread pants. Leg guards donned and pt able to thread LEs into pants with increased time with LEs now in extended position. He rolled to pull pants up.  Scoot transfer completed into chair with min A. Pt with decreased awareness into limitations and current need for assist, telling therapist to back off, though he required assist for safe transfer into chair. Grooming completed mod I at sink.  Therapist left room to reheat pt's breakfast, while gone, pt transferred w/c> EOB> supine mod I. Educated regarding pt's need for assistance with transfers and to not transfer without hospital staff present. Pt voiced understanding. RN and NT made aware of pt's impulsivity.  Discussed with pt role of OT and CIR goals. Pt under impression "therapy is done, I'm good", however, cont to require assist for BADLs, and will benefit from ongoing therapy to meet mod I goals.    Therapy Documentation Precautions:  Precautions Precautions: Fall Precaution Comments: spastic quadriplegia (since 1999); tone and spasms in BLE Required Braces or Orthoses: Knee Immobilizer - Right, Knee Immobilizer - Left Other Brace/Splint: All times except when  performing ROM Restrictions Weight Bearing Restrictions: Yes RLE Weight Bearing: Non weight bearing LLE Weight Bearing: Non weight bearing Pain:   No/ denies pain ADL: ADL ADL Comments: see Functional Assessment Tool  See Function Navigator for Current Functional Status.   Therapy/Group: Individual Therapy  Lewis, Dahiana Kulak C 12/26/2015, 7:09 AM

## 2015-12-26 NOTE — Progress Notes (Signed)
Occupational Therapy Note  Patient Details  Name: [name] MRN: [id number] Date of Birth: 12/06/1970  Today's Date: 12/26/2015 OT Individual Time: 1300-1330 OT Individual Time Calculation (min): 30 min   Pt denied pain Individual Therapy  Pt resting in w/c upon arrival.  Pt performed bed->w/c tranfser (assistance to steady w/c) and propelled to handicap accessible restroom in hallway.  Pt stated the setup was similar to home environment.  Pt performed w/c<>toilet transfer with steady A and assistance to steady w/c during transfer.  Pt agreed that he should practice transfer a few more times during therapy sessions. Pt propelled back to room and transferred to bed.  Focus on discharge planning, toilet transfers, bed mobility, w/c mobility, and safety awareness.   Lavone NeriLanier, Ceana Fiala [facility]wood[facility]appell 12/26/2015, 1:43 PM
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
## 2015-12-26 NOTE — Progress Notes (Signed)
Patient information reviewed and entered into eRehab system by Ilena Dieckman, RN, CRRN, PPS Coordinator.  Information including medical coding and functional independence measure will be reviewed and updated through discharge.     Per nursing patient was given "Data Collection Information Summary for Patients in Inpatient Rehabilitation Facilities with attached "Privacy Act Statement-Health Care Records" upon admission.  
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
## 2015-12-26 NOTE — Care Management Note (Signed)
[facility] Individual Statement of Services  Patient Name:  [name]  Date:  12/26/2015  Welcome to the [facility]patient [facility].  Our goal is to provide you with an individualized program based on your diagnosis and situation, designed to meet your specific needs.  With this comprehensive rehabilitation program, you will be expected to participate in at least 3 hours of rehabilitation therapies Monday-Friday, with modified therapy programming on the weekends.  Your rehabilitation program will include the following services:  Physical Therapy (PT), Occupational Therapy (OT), 24 hour per day rehabilitation nursing, Therapeutic Recreaction (TR), Case Management (Social Worker), Rehabilitation Medicine, Nutrition Services and Pharmacy Services  Weekly team conferences will be held on Tuesdays to discuss your progress.  Your Social Worker will talk with you frequently to get your input and to update you on team discussions.  Team conferences with you and your family in attendance may also be held.  Expected length of stay: 5-7 days  Overall anticipated outcome: modified independent  Depending on your progress and recovery, your program may change. Your Social Worker will coordinate services and will keep you informed of any changes. Your Social Worker's name and contact numbers are listed  below.  The following services may also be recommended but are not provided by the Inpatient [facility]:   Driving Evaluations  Home Health Rehabiltiation Services  Outpatient Rehabilitation Services    Arrangements will be made to provide these services after discharge if needed.  Arrangements include referral to agencies that provide these services.  Your insurance has been verified to be:  Medicare and Medivest Your primary doctor is:  Dr. Wynn BankerKirsteins  Pertinent information will be shared with your doctor and your insurance company.  Social Worker:  CoppellLucy  Najib Colmenares, [place] [phone] or [phone]) [phone]   Information discussed with and copy given to patient by: Amada JupiterHOYLE, [name], 12/26/2015, 3:41 PM

## 2015-12-26 NOTE — Progress Notes (Signed)
Physical Therapy Session Note  Patient Details  Name: [name] MRN: [id number] Date of Birth: [birth year]  Today's Date: 12/26/2015 PT Individual Time: 0900-1000 PT Individual Time Calculation (min): 60 min   Short Term Goals: Week 1:  PT Short Term Goal 1 (Week 1): = LTGS due to short LOS  Skilled Therapeutic Interventions/Progress Updates:    Doffed pants x2 (pt wanted to change pants also due to size, so they could go over the KI's)  to change limb guards to KI's per MD orders due to increased moisture when wearing limb guards. Pt required some assist with redonning pants due to spasms in BLE. Pt easily distractable and required redirection throughout session. Pt performed basic transfers with supervision in/out of w/c and on/off ADL apartment bed. Also added safety belt around LE's when up in w/c for preventing movement of BLE due to increased spasms. Discussed plan for w/c evaluation and assessment tomorrow [facility]Jason[facility]. Education throughout session on purpose of KI's, recommendations for modifications on w/c, and purpose of rehab services. End of session returned back to bed.  Therapy Documentation Precautions:  Precautions Precautions: Fall Precaution Comments: spastic quadriplegia (since 1999); tone and spasms in BLE Required Braces or Orthoses: Knee Immobilizer - Right, Knee Immobilizer - Left Other Brace/Splint: All times except when performing ROM Restrictions Weight Bearing Restrictions: Yes RLE Weight Bearing: Non weight bearing LLE Weight Bearing: Non weight bearing  Pain:  denies pain    See Function Navigator for Current Functional Status.   Therapy/Group: Individual Therapy  Karolee StampsGray, Rylie Knierim Darrol PokeBrescia  Berklee Battey B. Hazle Ogburn, PT, DPT  12/26/2015, 10:01 AM

## 2015-12-27 ENCOUNTER — Inpatient Hospital Stay (HOSPITAL_COMMUNITY): Payer: Self-pay

## 2015-12-27 ENCOUNTER — Inpatient Hospital Stay (HOSPITAL_COMMUNITY): Payer: Self-pay | Admitting: Occupational Therapy

## 2015-12-27 NOTE — Progress Notes (Signed)
Physical Therapy Session Note  Patient Details  Name: [name] MRN: [id number] Date of Birth: [birth year]  Today's Date: 12/27/2015 PT Individual Time: 1030-1045 PT Individual Time Calculation (min): 15 min   Short Term Goals: Week 1:  PT Short Term Goal 1 (Week 1): = LTGS due to short LOS  Skilled Therapeutic Interventions/Progress Updates:    Pt declined therapy stating that he just wanted to take the morning off and was hoping to get some rest. Education provided on importance of continued mobility and overall goals but pt declined. When asked if he was ready to go home tomorrow, he said "No, probably Friday" because he still had some things to work on but when therapist suggested working on various activities such as balance, transfers, strengthening, and limb wrapping, pt declined. RN made aware. Pt reports he will be ready for the w/c evaluation this afternoon.   Therapy Documentation Precautions:  Precautions Precautions: Fall Precaution Comments: spastic quadriplegia (since 1999); tone and spasms in BLE Required Braces or Orthoses: Knee Immobilizer - Right, Knee Immobilizer - Left Other Brace/Splint: All times except when performing ROM Restrictions Weight Bearing Restrictions: Yes RLE Weight Bearing: Non weight bearing LLE Weight Bearing: Non weight bearing General: PT Amount of Missed Time (min): 75 Minutes PT Missed Treatment Reason: Patient unwilling to participate Pain: Denies pain  See Function Navigator for Current Functional Status.   Therapy/Group: Individual Therapy  Karolee StampsGray, Elfreda Blanchet Darrol PokeBrescia  Kinnick Maus B. Jinny Sweetland, PT, DPT  12/27/2015, 11:14 AM

## 2015-12-27 NOTE — Plan of Care (Signed)
Problem: RH Light Housekeeping Goal: LTG Patient will perform light housekeeping w/assist (OT) LTG: Patient will perform light housekeeping with assistance, with/without cues (OT).  Outcome: Not Applicable Date Met:  04/47/15 Goal d/c due to not being a pt priority at this time. - 3/7 AL

## 2015-12-27 NOTE — Progress Notes (Signed)
Occupational Therapy Session Note  Patient Details  Name: [name] MRN: [id number] Date of Birth: 1970-11-05  Today's Date: 12/27/2015 OT Individual Time: [id number] OT Individual Time Calculation (min): 75 min    Short Term Goals: Week 1:  OT Short Term Goal 1 (Week 1): STG=LTG d/t short LOS  Skilled Therapeutic Interventions/Progress Updates:    Pt seen for OT ADL bathing/dressing session. Pt in supine upon arrival finishing bowel program and completing bathing task. Bathing/dressing completed with set-up from supine position. Pt required multiple cues and repeating of directions for problem solving LB dressing. Pt wearing B knee immobilizers for control of B LE spasms, causing knees to come into flexion and limiting pt's ability to thread pants. Recommended pt wearing knee immobilizers during LB dressing and suggestion for looser pants/ shorts in order to ease threading of pants over knee immobilizers.  Education and demonstration  Completed for bandaging and ACE wraps. Due to pt's LE spasms, required increased assist to complete ACE wrapping and donning knee immobilizers. Pt with decreased carry over of ACE wrapping techniques taught in PT session yesterday- will benefit from cont education and may need second person to assist with wrapping at d/c due to LE spasms. Grooming completed mod I at the sink.  He self propelled w/c to ADL apartment where he completed simulated tub transfer bench transfer into shower. He required max cuing for directions and understanding therapist's reasoning into why to practice and bathroom set-up. He appears to have difficulties with abstract problem solving, requiring demonstration in order to understand directions/ functional implications of instructions. Transfer completed with supervision with VCs for problem solving LE management.  Pt returned to room at end of session, requesting return to supine before next tx session. Pt left in supine set-up with  breakfast tray and all needs in reach.   Therapy Documentation Precautions:  Precautions Precautions: Fall Precaution Comments: spastic quadriplegia (since 1999); tone and spasms in BLE Required Braces or Orthoses: Knee Immobilizer - Right, Knee Immobilizer - Left Other Brace/Splint: All times except when performing ROM Restrictions Weight Bearing Restrictions: Yes RLE Weight Bearing: Non weight bearing LLE Weight Bearing: Non weight bearing Pain:   No/ denies pain ADL: ADL ADL Comments: see Functional Assessment Tool  See Function Navigator for Current Functional Status.   Therapy/Group: Individual Therapy  Lewis, Ozella Comins C 12/27/2015, 7:02 AM
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
## 2015-12-27 NOTE — Progress Notes (Signed)
Physical Therapy Session Note  Patient Details  Name: [name] MRN: [id number] Date of Birth: [birth year]  Today's Date: 12/27/2015 PT Individual Time: 1300-1400 PT Individual Time Calculation (min): 60 min   Short Term Goals: Week 1:  PT Short Term Goal 1 (Week 1): = LTGS due to short LOS  Skilled Therapeutic Interventions/Progress Updates:   First part of session met with Deatra Ina, ATP from [facility] for w/c modification assessment to accommodate for change in medical status. Recommendations to add anti-tippers to current w/c for safety and decreased fall risk (h/o of several falls out of w/c before due to not having anti-tippers), leg strap for positioning of BLE due to increased tone and spasms for safety, and bilateral residual limb support pads for positioning and safety.   Second part of session focused on functional transfer training to various surfaces (supervision to modified independent) including pt managing leg strap and bed mobility and stretching in prone on flat mat to simulate home environment. Pt usually sleeps in prone at home and this helps with tone so performed stretching in this position for low back and BLE stretching/tone management. Noted that KI's had slid down which ultimately pulled off ACE bandages. Therapist removed this upon return to room and notified RN of need for dressing change and re-wrapping of residual limbs.   Therapy Documentation Precautions:  Precautions Precautions: Fall Precaution Comments: spastic quadriplegia (since 1999); tone and spasms in BLE Required Braces or Orthoses: Knee Immobilizer - Right, Knee Immobilizer - Left Other Brace/Splint: All times except when performing ROM Restrictions Weight Bearing Restrictions: Yes RLE Weight Bearing: Non weight bearing LLE Weight Bearing: Non weight bearing  Pain: Denies pain.    See Function Navigator for Current Functional Status.   Therapy/Group: Individual Therapy  Canary Brim Ivory Broad, PT, DPT  12/27/2015, 2:30 PM

## 2015-12-28 ENCOUNTER — Inpatient Hospital Stay (HOSPITAL_COMMUNITY): Payer: Self-pay | Admitting: Occupational Therapy

## 2015-12-28 ENCOUNTER — Inpatient Hospital Stay (HOSPITAL_COMMUNITY): Payer: Medicare Other

## 2015-12-28 ENCOUNTER — Inpatient Hospital Stay (HOSPITAL_COMMUNITY): Payer: Self-pay | Admitting: Physical Therapy

## 2015-12-28 ENCOUNTER — Inpatient Hospital Stay (HOSPITAL_COMMUNITY): Payer: Medicare Other | Admitting: Physical Therapy

## 2015-12-28 NOTE — Progress Notes (Signed)
Occupational Therapy Note  Patient Details  Name: [name] MRN: [id number] Date of Birth: Apr 18, 1971  Today's Date: 12/28/2015 OT Individual Time: 1400-1430 OT Individual Time Calculation (min): 30 min   Pt denied pain Individual therapy  Pt resting in bed upon arrival and agreeable to therapy.  Pt declined practicing toilet transfers but agreeable to BUE therex for upper body strengthening and cardio on SciFit.  Pt initially engaged in lat pull downs on weight machine before transitioning to [employer] (10 mins at work load 5).  Pt returned to room and remained in w/c awaiting next therapy.   Lavone NeriLanier, Maguire Killmer [facility] 12/28/2015, 2:47 PM

## 2015-12-28 NOTE — Progress Notes (Signed)
Physical Therapy Session Note  Patient Details  Name: Pearletha Forgeherman Callens MRN: [id number] Date of Birth: 15-Sep-1971  Today's Date: 12/28/2015 PT Individual Time: 1520-1615 PT Individual Time Calculation (min): 55 min   Short Term Goals: Week 1:  PT Short Term Goal 1 (Week 1): = LTGS due to short LOS  Skilled Therapeutic Interventions/Progress Updates:   Patient in wheelchair upon arrival. Patient propelled wheelchair on unit and performed squat pivot transfers from wheelchair to and from ADL bed and hospital bed with mod I. Patient transitioned to prone for prolonged hip flexor stretch x 10 min which he reports improves low back pain and decreases tone in BLE. Remainder of session focused on community wheelchair mobility throughout hospital, on/off elevators, over thresholds, and outdoors on brick and concrete surfaces, inclines/declines, and up/down ramp access to employee parking deck with mod I. Patient requested to return to bed, left semi reclined with all needs within reach and 4 rails up.   Therapy Documentation Precautions:  Precautions Precautions: Fall Precaution Comments: spastic quadriplegia (since 1999); tone and spasms in BLE Required Braces or Orthoses: Knee Immobilizer - Right, Knee Immobilizer - Left Other Brace/Splint: All times except when performing ROM Restrictions Weight Bearing Restrictions: Yes RLE Weight Bearing: Non weight bearing LLE Weight Bearing: Non weight bearing Pain: Pain Assessment Pain Assessment: No/denies pain   See Function Navigator for Current Functional Status.   Therapy/Group: Individual Therapy  Kerney ElbeVarner, Leonid Manus A 12/28/2015, 4:50 PM

## 2015-12-28 NOTE — Progress Notes (Signed)
Occupational Therapy Session Note  Patient Details  Name: [name] MRN: [id number] Date of Birth: 23-Aug-1971  Today's Date: 12/28/2015 OT Individual Time: 1000-1100 OT Individual Time Calculation (min): 60 min    Short Term Goals: Week 1:  OT Short Term Goal 1 (Week 1): STG=LTG d/t short LOS  Skilled Therapeutic Interventions/Progress Updates:    Pt seen for OT tx session focusing on functional transfers and medical device management. Pt sitting up in w/c upon arrival, agreeable to tx session. He completed functional transfers throughout session mod I with exception of supervision for toilet transfer. In ADL apartment, pt sat long sitting on standard bed to practice donning/doffing knee immobilizers independently. He demonstrated difficulty managing straps on immobilizers. Coban applied to tabs of straps for pt to be able to more easily find and remove straps.  He completed transfer w/c <> standard toilet in handicap accessible bathroom to simulate home environment. Completed transfer mod I, however, VCs provided for problem solving safer technique for management of B LEs and for lateral leans for pt to place suppository as part of bowel program.  He cont to require redirection and continuous cuing/ education throughout session due to baseline cognitive impairments. Pt returned to room at end of session, left in supine with all needs in reach.   Therapy Documentation Precautions:  Precautions Precautions: Fall Precaution Comments: spastic quadriplegia (since 1999); tone and spasms in BLE Required Braces or Orthoses: Knee Immobilizer - Right, Knee Immobilizer - Left Other Brace/Splint: All times except when performing ROM Restrictions Weight Bearing Restrictions: Yes RLE Weight Bearing: Non weight bearing LLE Weight Bearing: Non weight bearing Pain:  No/ denies pain ADL: ADL ADL Comments: see Functional Assessment Tool  See Function Navigator for Current Functional  Status.   Therapy/Group: Individual Therapy  Lewis, Desani Sprung C 12/28/2015, 7:14 AM
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
## 2015-12-28 NOTE — Progress Notes (Signed)
Orthopedic Tech Progress Note Patient Details:  [name] 07-Nov-1970 [id number]  Patient ID: [name], male   DOB: 07-Nov-1970, 45 y.o.   MRN: [id number] Called in advanced brace order; spoke with [name]  [name] 12/28/2015, 10:14 AM

## 2015-12-28 NOTE — Progress Notes (Signed)
Social Work Patient ID: [name], male   DOB: 1971-02-11, 45 y.o.   MRN: [id number]  Met with pt yesterday afternoon to review team conference.  He is pleased with targeted d/c on 3/10 and aware that his w/c modifications should be done prior to that.  Denies any concerns at this time. Arranging [facility] follow up for him.  Ruben Mahler, LCSW

## 2015-12-28 NOTE — Progress Notes (Signed)
Physical Therapy Session Note  Patient Details  Name: Pearletha Forgeherman Furgerson MRN: [id number] Date of Birth: 01-21-1971  Today's Date: 12/28/2015 PT Individual Time: 0906-1003 PT Individual Time Calculation (min): 57 min   Short Term Goals: Week 1:  PT Short Term Goal 1 (Week 1): = LTGS due to short LOS  Skilled Therapeutic Interventions/Progress Updates:    Patient received supine in Bed. Patient performed bed<>chair transfer from various heights x 5 with Supervision assist with cues for proper WC placement and hand placement. Pt instructed in toilet [facility]. Supervision Assist from PT with cues for proper WC positioning and safety of transfer with WC to make sure brakes ar locks. Patient intrusted in sitting balance training without back support; ball toss x 20 within BOS, x 20 slightly outside BOS, verbal instruction for improved posture as possible and improved core activation to maintain upright posture with. Sitting balance also performed with cross body and lateral reach with bean bags. X 15 each direction; cues for decreased UE support to improve challenge.  Patient educated on need to wear knee immobilizer rather than Residual limb guard. PT assisted patient with donning Bilateral knee immobilizer with Max A. [facility] call bell within reach.    Therapy Documentation Precautions:  Precautions Precautions: Fall Precaution Comments: spastic quadriplegia (since 1999); tone and spasms in BLE Required Braces or Orthoses: Knee Immobilizer - Right, Knee Immobilizer - Left Other Brace/Splint: All times except when performing ROM Restrictions Weight Bearing Restrictions: Yes RLE Weight Bearing: Non weight bearing LLE Weight Bearing: Non weight bearing Pain: Pain Assessment Pain Assessment: No/denies pain  See Function Navigator for Current Functional Status.   Therapy/Group: Individual Therapy  Golden Popustin E Tinlee Navarrette 12/28/2015, 12:42 PM

## 2015-12-28 NOTE — Progress Notes (Signed)
[facility]    S:    Doing well    No pain     Likely dc'ing Friday   O:   BP 104/52 mmHg  Pulse 96  Temp(Src) 98.6 F (37 C) (Oral)  Resp 18  Ht 6\' 2"  (1.88 m)  Wt   SpO2 100%  Gen: resting comfortably  Ext:       B LEx         Dressings changed  All wounds look great  Difficulty getting Left knee to full extension but seems to have flexor tone at baseline  A:     8 days s/p B BKA for severe B LEx open fxs  P:    Dressings changed B     Continue with daily dressing changes    Ok to wash with soap and water, pt can shower. Can remove dressings and shower    Pt can cycle limb guards and knee immobilizers    Will order stump shrinkers     Follow up with ortho in 2 weeks (either 3/20 or 3/22)  Mearl LatinKeith W. Haskell Rihn, PA-C [facility] [phone] (P) 12/28/2015 9:13 AM
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
## 2015-12-29 ENCOUNTER — Inpatient Hospital Stay (HOSPITAL_COMMUNITY): Payer: Self-pay | Admitting: Occupational Therapy

## 2015-12-29 ENCOUNTER — Telehealth: Payer: Self-pay | Admitting: Physical Medicine & Rehabilitation

## 2015-12-29 ENCOUNTER — Inpatient Hospital Stay (HOSPITAL_COMMUNITY): Payer: Self-pay

## 2015-12-29 NOTE — Progress Notes (Signed)
Occupational Therapy Session Note  Patient Details  Name: [name] MRN: [id number] Date of Birth: [birth year]  Today's Date: 12/29/2015 OT Individual Time: [id number] OT Individual Time Calculation (min): 50 min  and Today's Date: 12/29/2015 OT Missed Time: 10 Minutes Missed Time Reason: Other (comment) (Pt preforming bowel program)   Short Term Goals: Week 1:  OT Short Term Goal 1 (Week 1): STG=LTG d/t short LOS  Skilled Therapeutic Interventions/Progress Updates:    Pt seen for OT ADL bathing/ dressing session. Pt in sidelying upon arrival completing toileting bowel program, requesting therapist to return later. Therapist returned 10 minutes later and pt agreeable to cont with therapy. He completed bathing/ dressing in supine mod I. Educated pt throughout regarding knee immoblizers vs/ limb guards and purpose of shrinkers of residual limb healing/ positioning.  He completed functional transfers throughout session mod I via squat pivot/ lateral scoot at Mod I level. Grooming completed independently at sink.  In handicap accessible bathroom simulating home bathroom, pt completed scoot transfer to/from toilet mod I, demonstrating functional lean into w/c in simulation of clothing management and placing suppository as part of bowel program. In ADL apartment, completed simulated tub/shower combo transfer w/c <> tub transfer bench mod I. Pt stated MD cleared him to wear LE limb guards as part of spasm control. However,  Limb guards fell off throughout session when pt had severe spasms sendings B LEs into flexion. Therapist offered possible solutions for better positioning and securing leg guards. Pt desiring to cont to problem solve better option.  He returned to room at end of session, left set-up with meal tray sitting in w/c, all needs in reach.    Therapy Documentation Precautions:  Precautions Precautions: Fall Precaution Comments: spastic quadriplegia (since 1999); tone and spasms in  BLE Required Braces or Orthoses: Knee Immobilizer - Right, Knee Immobilizer - Left Other Brace/Splint: All times except when performing ROM Restrictions Weight Bearing Restrictions: Yes RLE Weight Bearing: Non weight bearing LLE Weight Bearing: Non weight bearing Pain:   No/ denies pain ADL: ADL ADL Comments: see Functional Assessment Tool  See Function Navigator for Current Functional Status.   Therapy/Group: Individual Therapy  Lewis, Virgilio Broadhead C 12/29/2015, 7:12 AM
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
## 2015-12-29 NOTE — Discharge Instructions (Signed)
Inpatient Rehab Discharge Instructions  Pearletha Forgeherman Perkins Discharge date and time: No discharge date for patient encounter.   Activities/Precautions/ Functional Status: Activity: activity as tolerated Diet: regular diet Wound Care: keep wound clean and dry Functional status:  ___ No restrictions     ___ Walk up steps independently ___ 24/7 supervision/assistance   ___ Walk up steps with assistance ___ Intermittent supervision/assistance  ___ Bathe/dress independently ___ Walk with walker     _x__ Bathe/dress with assistance ___ Walk Independently    ___ Shower independently ___ Walk with assistance    ___ Shower with assistance ___ No alcohol     ___ Return to work/school ________  COMMUNITY REFERRALS UPON DISCHARGE:    Home Health:   PT     OT    RN                        Agency: [facility] Phone: [phone]       Special Instructions: Bilateral knee immobilizers at all times except with physical therapy   My questions have been answered and I understand these instructions. I will adhere to these goals and the provided educational materials after my discharge from the hospital.  Patient/Caregiver Signature _______________________________ Date __________  Clinician Signature _______________________________________ Date __________  Please bring this form and your medication list with you to all your follow-up doctor's appointments.

## 2015-12-29 NOTE — Telephone Encounter (Signed)
Patient needs an approval date to when he can drive again faxed over to [phone].  Any questions please call patient.

## 2015-12-29 NOTE — Progress Notes (Signed)
Physical Therapy Discharge Summary  Patient Details  Name: [name] MRN: [id number] Date of Birth: 1971/09/11  Patient has met 6 of 6 long term goals due to improved activity tolerance, improved balance and ability to compensate for deficits.  Patient to discharge at a wheelchair level supervision to modified independent.   Reasons goals not met: n/a - all goals met at this time  Recommendation:  Patient will benefit from ongoing skilled PT services in home health setting for evaluation to continue to advance safe functional mobility, address ongoing impairments in tone management, skin protection education, and residual limb edema/positioning education and minimize fall risk.  Equipment: w/c modifications made with [place] to address safety and new center of gravity recommending anti-tippers, strap on BLE for tone management and positioning, and bilateral residual limb support pads.  Reasons for discharge: treatment goals met and discharge from hospital  Patient/family agrees with progress made and goals achieved: Yes  PT Discharge Precautions/Restrictions Precautions Precautions: Fall Precaution Comments: spastic quadriplegia (since 1999); tone and spasms in BLE Required Braces or Orthoses: Other Brace/Splint;Knee Immobilizer - Right;Knee Immobilizer - Left Other Brace/Splint: Limb guard or KI's on BLE all times except ROM Restrictions Weight Bearing Restrictions: Yes RLE Weight Bearing: Non weight bearing LLE Weight Bearing: Non weight bearing Cognition Overall Cognitive Status: History of cognitive impairments - at baseline Orientation Level: Oriented X4 Safety/Judgment: Impaired  Pt with noted impaired attention, memory, and overall safety awareness. 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; pt without volitional movement in BLE; now new B BKA     Trunk/Postural Assessment  Cervical Assessment Cervical Assessment: Within Functional Limits Thoracic Assessment Thoracic Assessment:  (maintains flexed position) Lumbar Assessment Lumbar Assessment: Exceptions to [facility] (posterior pelvic tilt; decreased ROM)  Balance Balance Balance Assessed: Yes Static Sitting Balance Static Sitting - Level of Assistance: 6: Modified independent (Device/Increase time) Dynamic Sitting Balance Dynamic Sitting - Level of Assistance: 6: Modified independent (Device/Increase time) Extremity Assessment   see OT d/c summary for details of UEs   RLE Assessment RLE Assessment: Exceptions to [facility] RLE Strength RLE Overall Strength Comments: no active movement (due to C5-7 SCI in 1999); ROM limited due to tightness due to spasticity long-term RLE Tone RLE Tone: Hypertonic Hypertonic Details: spasms LLE Assessment LLE Assessment: Exceptions to [facility] LLE Strength LLE Overall Strength Comments: no active movement (due to C5-7 SCI in 1999); ROM limited due to tightness due to spasticity long-term LLE Tone LLE Tone: Hypertonic Hypertonic Details: spasms   See Function Navigator for Current Functional Status.  Canary Brim Ivory Broad, PT, DPT  12/29/2015, 9:24 AM

## 2015-12-29 NOTE — Telephone Encounter (Signed)
Notified Mr Kizzie BaneHughes
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
## 2015-12-29 NOTE — Telephone Encounter (Signed)
Please advise 

## 2015-12-29 NOTE — Progress Notes (Signed)
Physical Therapy Session Note  Patient Details  Name: [name] MRN: [id number] Date of Birth: 1971-06-17  Today's Date: 12/29/2015 PT Individual Time: 0900-1000 PT Individual Time Calculation (min): 60 min   Short Term Goals: Week 1:  PT Short Term Goal 1 (Week 1): = LTGS due to short LOS  Skilled Therapeutic Interventions/Progress Updates:   Session focused on performing grad day activities, preparing for discharge, functional transfers to various heights practicing in pt's personal w/c, and addressing overall management of BLE with limb guards or KI's. Pt's personal w/c now has anti-tippers but [employer] will deliver amputee pads when available, so demonstrated safer technique for pt to perform scoot pivot transfers modified independent by leaving/leading residual limbs on bed for support since pt does not have support of residual limb pads like he did on loaner w/c here initially upon d/c. Pt performed bed mobility mod I on flat bed and in prone to stretch low back and residual limbs into extension. W/c propulsion modified independent on unit. Limb guards kept falling off during mobility during session, so pt request to return to trial with KI's. See d/c summary for further details.   Therapy Documentation Precautions:  Precautions Precautions: Fall Precaution Comments: spastic quadriplegia (since 1999); tone and spasms in BLE Required Braces or Orthoses: Other Brace/Splint, Knee Immobilizer - Right, Knee Immobilizer - Left Other Brace/Splint: Limb guard or KI's on BLE all times except ROM Restrictions Weight Bearing Restrictions: Yes RLE Weight Bearing: Non weight bearing LLE Weight Bearing: Non weight bearing  Pain: Pain Assessment Pain Assessment: No/denies pain   See Function Navigator for Current Functional Status.   Therapy/Group: Individual Therapy  Karolee StampsGray, Ledonna Dormer Darrol PokeBrescia  Jeydi Klingel B. Legacy [place], PT, DPT  12/29/2015, 11:09 AM

## 2015-12-29 NOTE — Progress Notes (Signed)
Occupational Therapy Session Note  Patient Details  Name: Pearletha Forgeherman Michel MRN: [id number] Date of Birth: 1971/08/06  Today's Date: 12/29/2015 OT Individual Time: 1030-1130 OT Individual Time Calculation (min): 60 min    Short Term Goals: Week 1:  OT Short Term Goal 1 (Week 1): STG=LTG d/t short LOS  Skilled Therapeutic Interventions/Progress Updates:    1:1 PT received in bed. Pt performed scoot pivot transfer from bed into w/c mod I.  (KI already on). Focus of session on  Cardiopulmonary endurance and UB/ core strengthening and conditioning. Pt propelled self down to first floor and outside. PT went up and down ramp 2x and around west end of hospital outside practicing uneven terrain and different elevations. Adjusted antipers  to shortest length to avoid getting caught on surfaces outside. Pt able to participate in activity with supervision with occasional min A with mobility in garage to preserve hands (didnt have gloves on).  Therapy Documentation Precautions:  Precautions Precautions: Fall Precaution Comments: spastic quadriplegia (since 1999); tone and spasms in BLE Required Braces or Orthoses: Other Brace/Splint, Knee Immobilizer - Right, Knee Immobilizer - Left Other Brace/Splint: Limb guard or KI's on BLE all times except ROM Restrictions Weight Bearing Restrictions: Yes RLE Weight Bearing: Non weight bearing LLE Weight Bearing: Non weight bearing General: General OT Amount of Missed Time: 10 Minutes Vital Signs:   Pain: Pain Assessment Pain Assessment: No/denies pain ADL: ADL ADL Comments: see Functional Assessment Tool Exercises:   Other Treatments:    See Function Navigator for Current Functional Status.   Therapy/Group: Individual Therapy  Roney MansSmith, Quanta Robertshaw [facility] 12/29/2015, 11:13 AM

## 2015-12-29 NOTE — Discharge Summary (Signed)
Discharge summary job [id number]

## 2015-12-29 NOTE — Progress Notes (Signed)
Occupational Therapy Discharge Summary  Patient Details  Name: [name] MRN: [id number] Date of Birth: February 09, 1971  Patient has met 7 of 7 long term goals due to improved activity tolerance, improved balance, postural control, ability to compensate for deficits and improved coordination.  Patient to discharge at overall Modified Independent level.  Patient's care partner is independent to provide the necessary physical and cognitive assistance at discharge.    Recommendation:  Patient with no further OT needs at this time.   Equipment: No equipment provided. Pt has all needed equipment.   Reasons for discharge: treatment goals met and discharge from hospital  Patient/family agrees with progress made and goals achieved: Yes  OT Discharge Precautions/Restrictions  Precautions Precautions: Fall Precaution Comments: spastic quadriplegia (since 1999); tone and spasms in BLE Required Braces or Orthoses: Other Brace/Splint;Knee Immobilizer - Right;Knee Immobilizer - Left Other Brace/Splint: Limb guard or KI's on BLE all times except ROM Restrictions Weight Bearing Restrictions: Yes RLE Weight Bearing: Non weight bearing LLE Weight Bearing: Non weight bearing ADL ADL ADL Comments: see Functional Assessment Tool Vision/Perception  Vision- History Baseline Vision/History: No visual deficits Vision- Assessment Vision Assessment?: No apparent visual deficits  Cognition Overall Cognitive Status: History of cognitive impairments - at baseline Arousal/Alertness: Awake/alert Orientation Level: Oriented X4 Attention: Selective Selective Attention: Impaired Selective Attention Impairment: Verbal complex;Functional complex Memory: Impaired Awareness: Appears intact Problem Solving: Impaired Problem Solving Impairment: Functional complex Self Correcting: Impaired Behaviors: Impulsive Safety/Judgment: Impaired Comments: Cues for problem solving and decreased understanding of  abstract concepts when functionally problem solving Sensation Sensation Light Touch: Impaired Detail Light Touch Impaired Details: Impaired RLE;Impaired LLE Proprioception: Impaired Detail Proprioception Impaired Details: Impaired RLE;Impaired LLE Additional Comments: SCI C5 since 1999; BUE wfl Coordination Gross Motor Movements are Fluid and Coordinated: No Fine Motor Movements are Fluid and Coordinated: Yes Motor  Motor Motor: Tetraplegia;Abnormal tone Motor - Discharge Observations: pt with long standing SCI resulting in 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; pt without volitional movement in BLE; now new B BKA Trunk/Postural Assessment  Cervical Assessment Cervical Assessment: Within Functional Limits Thoracic Assessment Thoracic Assessment: Exceptions to [facility] (Flexed posture) Lumbar Assessment Lumbar Assessment: Exceptions to [facility] (POsterior pelvic tilt) Postural Control Postural Control: Deficits on evaluation  Balance Balance Balance Assessed: Yes Static Sitting Balance Static Sitting - Level of Assistance: 6: Modified independent (Device/Increase time) Dynamic Sitting Balance Dynamic Sitting - Level of Assistance: 6: Modified independent (Device/Increase time) Extremity/Trunk Assessment RUE Assessment RUE Assessment: Exceptions to [facility] RUE Strength RUE Overall Strength: Due to premorbid status (hx of C-5 incomplete tetraplegia) LUE Assessment LUE Assessment: Exceptions to [facility] LUE Strength LUE Overall Strength: Due to impaired cognition (hx of C-5 incomplete tetraplegia)   See Function Navigator for Current Functional Status.  Bobby Rumpf, Posie Lillibridge C 12/29/2015, 3:39 PM
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
## 2015-12-30 MED ORDER — DOCUSATE SODIUM 100 MG PO CAPS
100.0000 mg | ORAL_CAPSULE | Freq: Two times a day (BID) | ORAL | Status: DC
Start: 2015-12-30 — End: 2016-03-15

## 2015-12-30 MED ORDER — OXYCODONE-ACETAMINOPHEN 5-325 MG PO TABS: 1.0000 | ORAL_TABLET | ORAL | Status: DC | PRN

## 2015-12-30 MED ORDER — BACLOFEN 10 MG PO TABS: 10.0000 mg | ORAL_TABLET | Freq: Four times a day (QID) | ORAL | Status: DC

## 2015-12-30 MED ORDER — DIVALPROEX SODIUM ER 500 MG PO TB24: 500.0000 mg | ORAL_TABLET | Freq: Two times a day (BID) | ORAL | Status: DC

## 2015-12-30 MED ORDER — SULFAMETHOXAZOLE-TRIMETHOPRIM 800-160 MG PO TABS: 1.0000 | ORAL_TABLET | Freq: Two times a day (BID) | ORAL | Status: DC

## 2015-12-30 NOTE — Progress Notes (Signed)
Pt discharged to home at 1315 with family. Discharge instructions given to pt and mother by [name], PA with verbal understanding.
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
## 2015-12-30 NOTE — Progress Notes (Signed)
Social Work  Discharge Note  The overall goal for the admission was met for:   Discharge location: Yes - home with mother who can provide intermittent assist  Length of Stay: Yes - 7 days  Discharge activity level: Yes - mod independent w/c level overall  Home/community participation: Yes  Services provided included: MD, RD, PT, OT, RN, Pharmacy and [place]: Medicare  Follow-up services arranged: Home Health: RN, PT, OT via [facility] and Patient/Family has no preference for HH/DME agencies  Comments (or additional information):  Patient/Family verbalized understanding of follow-up arrangements: Yes  Individual responsible for coordination of the follow-up plan: pt  Confirmed correct DME delivered: NA - [facility] making needed adjustments to current w/c    Arianni Gallego

## 2015-12-31 DIAGNOSIS — S82872S Displaced pilon fracture of left tibia, sequela: Secondary | ICD-10-CM | POA: Diagnosis not present

## 2015-12-31 DIAGNOSIS — R339 Retention of urine, unspecified: Secondary | ICD-10-CM | POA: Diagnosis not present

## 2015-12-31 DIAGNOSIS — S82402S Unspecified fracture of shaft of left fibula, sequela: Secondary | ICD-10-CM | POA: Diagnosis not present

## 2015-12-31 DIAGNOSIS — S82871S Displaced pilon fracture of right tibia, sequela: Secondary | ICD-10-CM | POA: Diagnosis not present

## 2015-12-31 DIAGNOSIS — Z993 Dependence on wheelchair: Secondary | ICD-10-CM | POA: Diagnosis not present

## 2015-12-31 DIAGNOSIS — S8781XS Crushing injury of right lower leg, sequela: Secondary | ICD-10-CM | POA: Diagnosis not present

## 2015-12-31 DIAGNOSIS — S14107S Unspecified injury at C7 level of cervical spinal cord, sequela: Secondary | ICD-10-CM | POA: Diagnosis not present

## 2015-12-31 DIAGNOSIS — N390 Urinary tract infection, site not specified: Secondary | ICD-10-CM | POA: Diagnosis not present

## 2015-12-31 DIAGNOSIS — Z720 Tobacco use: Secondary | ICD-10-CM | POA: Diagnosis not present

## 2015-12-31 DIAGNOSIS — S82401S Unspecified fracture of shaft of right fibula, sequela: Secondary | ICD-10-CM | POA: Diagnosis not present

## 2015-12-31 DIAGNOSIS — Z89511 Acquired absence of right leg below knee: Secondary | ICD-10-CM | POA: Diagnosis not present

## 2015-12-31 DIAGNOSIS — K592 Neurogenic bowel, not elsewhere classified: Secondary | ICD-10-CM | POA: Diagnosis not present

## 2015-12-31 DIAGNOSIS — N319 Neuromuscular dysfunction of bladder, unspecified: Secondary | ICD-10-CM | POA: Diagnosis not present

## 2015-12-31 DIAGNOSIS — S8782XS Crushing injury of left lower leg, sequela: Secondary | ICD-10-CM | POA: Diagnosis not present

## 2015-12-31 DIAGNOSIS — G8250 Quadriplegia, unspecified: Secondary | ICD-10-CM | POA: Diagnosis not present

## 2015-12-31 DIAGNOSIS — G40909 Epilepsy, unspecified, not intractable, without status epilepticus: Secondary | ICD-10-CM | POA: Diagnosis not present

## 2015-12-31 DIAGNOSIS — Z89512 Acquired absence of left leg below knee: Secondary | ICD-10-CM | POA: Diagnosis not present

## 2016-01-01 DIAGNOSIS — S82871S Displaced pilon fracture of right tibia, sequela: Secondary | ICD-10-CM | POA: Diagnosis not present

## 2016-01-01 DIAGNOSIS — S8782XS Crushing injury of left lower leg, sequela: Secondary | ICD-10-CM | POA: Diagnosis not present

## 2016-01-01 DIAGNOSIS — S82872S Displaced pilon fracture of left tibia, sequela: Secondary | ICD-10-CM | POA: Diagnosis not present

## 2016-01-01 DIAGNOSIS — S82402S Unspecified fracture of shaft of left fibula, sequela: Secondary | ICD-10-CM | POA: Diagnosis not present

## 2016-01-01 DIAGNOSIS — S8781XS Crushing injury of right lower leg, sequela: Secondary | ICD-10-CM | POA: Diagnosis not present

## 2016-01-01 DIAGNOSIS — S82401S Unspecified fracture of shaft of right fibula, sequela: Secondary | ICD-10-CM | POA: Diagnosis not present

## 2016-01-02 ENCOUNTER — Ambulatory Visit (INDEPENDENT_AMBULATORY_CARE_PROVIDER_SITE_OTHER): Payer: Medicare Other | Admitting: Neurology

## 2016-01-02 ENCOUNTER — Encounter: Payer: Self-pay | Admitting: Neurology

## 2016-01-02 VITALS — BP 92/60 | HR 88 | Resp 20

## 2016-01-02 DIAGNOSIS — G4733 Obstructive sleep apnea (adult) (pediatric): Secondary | ICD-10-CM | POA: Diagnosis not present

## 2016-01-02 DIAGNOSIS — R561 Post traumatic seizures: Secondary | ICD-10-CM | POA: Diagnosis not present

## 2016-01-02 DIAGNOSIS — Z9989 Dependence on other enabling machines and devices: Principal | ICD-10-CM

## 2016-01-02 DIAGNOSIS — S8782XS Crushing injury of left lower leg, sequela: Secondary | ICD-10-CM | POA: Diagnosis not present

## 2016-01-02 DIAGNOSIS — S82872S Displaced pilon fracture of left tibia, sequela: Secondary | ICD-10-CM | POA: Diagnosis not present

## 2016-01-02 DIAGNOSIS — S82871S Displaced pilon fracture of right tibia, sequela: Secondary | ICD-10-CM | POA: Diagnosis not present

## 2016-01-02 DIAGNOSIS — S82401S Unspecified fracture of shaft of right fibula, sequela: Secondary | ICD-10-CM | POA: Diagnosis not present

## 2016-01-02 DIAGNOSIS — S82402S Unspecified fracture of shaft of left fibula, sequela: Secondary | ICD-10-CM | POA: Diagnosis not present

## 2016-01-02 DIAGNOSIS — G8250 Quadriplegia, unspecified: Secondary | ICD-10-CM | POA: Diagnosis not present

## 2016-01-02 DIAGNOSIS — S8781XS Crushing injury of right lower leg, sequela: Secondary | ICD-10-CM | POA: Diagnosis not present

## 2016-01-02 MED ORDER — DIVALPROEX SODIUM ER 500 MG PO TB24: 500.0000 mg | ORAL_TABLET | Freq: Two times a day (BID) | ORAL | Status: DC

## 2016-01-02 NOTE — Patient Instructions (Addendum)
Epilepsy People with epilepsy have times when they shake and jerk uncontrollably (seizures). This happens when there is a sudden change in brain function. Epilepsy may have many possible causes. Anything that disturbs the normal pattern of brain cell activity can lead to seizures. HOME CARE   Follow your doctor's instructions about driving and safety during normal activities.  Get enough sleep.  Only take medicine as told by your doctor.  Avoid things that you know can cause you to have seizures (triggers).  Write down when your seizures happen and what you remember about each seizure. Write down anything you think may have caused the seizure to happen.  Tell the people you live and work with that you have seizures. Make sure they know how to help you. They should:  Cushion your head and body.  Turn you on your side.  Not restrain you.  Not place anything inside your mouth.  Call for local emergency medical help if there is any question about what has happened.  Keep all follow-up visits with your doctor. This is very important. GET HELP IF:  You get an infection or start to feel sick. You may have more seizures when you are sick.  You are having seizures more often.  Your seizure pattern is changing. GET HELP RIGHT AWAY IF:   A seizure does not stop after a few seconds or minutes.  A seizure causes you to have trouble breathing.  A seizure gives you a very bad headache.  A seizure makes you unable to speak or use a part of your body.   This information is not intended to replace advice given to you by your health care provider. Make sure you discuss any questions you have with your health care provider.   Document Released: 08/05/2009 Document Revised: 07/29/2013 Document Reviewed: 05/20/2013 Elsevier Interactive Patient Education 2016 Elsevier Inc. Valproic Acid, Divalproex Sodium sprinkle capsule What is this medicine? DIVALPROEX SODIUM (dye VAL pro ex SO dee um)  is used to treat certain types of seizures in patients with epilepsy. This medicine may be used for other purposes; ask your health care provider or pharmacist if you have questions. What should I tell my health care provider before I take this medicine? They need to know if you have any of these conditions: -blood disease -brain damage or disease -kidney disease -liver disease -low blood proteins -mitochondrial disease -suicidal thoughts, plans, or attempt; a previous suicide attempt by you or a family member -urea cycle disorder (UCD) -an unusual or allergic reaction to divalproex sodium, other medicines, foods, dyes, or preservatives -pregnant or trying to get pregnant -breast-feeding How should I use this medicine? Take this medicine by mouth. It can be swallowed whole or the capsules may be opened carefully and the contents sprinkled on about one teaspoonful of applesauce or pudding. This mixture must be swallowed immediately. Do not chew or store for later use. Follow the directions on the prescription label. Take your doses at regular intervals. Do not take your medicine more often than directed. Do not stop taking this medicine unless instructed by your doctor or health care professional. Stopping your medicine suddenly can increase your seizures or their severity. Talk to your pediatrician regarding the use of this medicine in children. Special care may be needed. Overdosage: If you think you have taken too much of this medicine contact a poison control center or emergency room at once. NOTE: This medicine is only for you. Do not share this medicine with others.  What if I miss a dose? If you miss a dose, take it as soon as you can. If it is almost time for your next dose, take only that dose. Do not take double or extra doses. What may interact with this medicine? -aspirin -barbiturates, like phenobarbital -diazepam -isoniazid -medicines for depression, anxiety, or psychotic  disturbances -medicines that treat or prevent blood clots like warfarin -meropenem -other seizure medicines -rifampin -tolbutamide -zidovudine This list may not describe all possible interactions. Give your health care provider a list of all the medicines, herbs, non-prescription drugs, or dietary supplements you use. Also tell them if you smoke, drink alcohol, or use illegal drugs. Some items may interact with your medicine. What should I watch for while using this medicine? Visit your doctor or health care professional for regular checks on your progress. Wear a [employer] Alert bracelet or necklace. Carry an identification card with information about your condition, medications, and doctor or health care professional. Bonita QuinYou may get drowsy, dizzy, or have blurred vision. Do not drive, use machinery, or do anything that needs mental alertness until you know how this medicine affects you. To reduce dizzy or fainting spells, do not sit or stand up quickly, especially if you are an older patient. Alcohol can increase drowsiness and dizziness. Avoid alcoholic drinks. This medicine can cause blood problems. This can mean slow healing and a risk of infection. Problems can arise if you need dental work, and in the day to day care of your teeth. Try to avoid damage to your teeth and gums when you brush or floss your teeth. This medicine can make you more sensitive to the sun. Keep out of the sun. If you cannot avoid being in the sun, wear protective clothing and use sunscreen. Do not use sun lamps or tanning beds/booths. The use of this medicine may increase the chance of suicidal thoughts or actions. Pay special attention to how you are responding while on this medicine. Any worsening of mood, or thoughts of suicide or dying should be reported to your health care professional right away. Women who become pregnant while using this medicine may enroll in the [place] Antiepileptic Drug Pregnancy Registry by  calling [phone]. This registry collects information about the safety of antiepileptic drug use during pregnancy. Contact your doctor or healthcare professional if you notice any part of your medicine in your stool. Your healthcare provider may want to check the amount of medicine in your blood if this happens. What side effects may I notice from receiving this medicine? Side effects that you should report to your doctor or health care professional as soon as possible: -allergic reactions like skin rash, itching or hives, swelling of the face, lips, or tongue -changes in the frequency or severity of seizures -double vision or uncontrollable eye movements -nausea and vomiting -redness, blistering, peeling or loosening of the skin, including inside the mouth -stomach pain or cramps -trembling of hands or arms -unusual bleeding or bruising or pinpoint red spots on the skin -unusual swelling of the arms or legs -unusually weak or tired -worsening of mood, thoughts or actions of suicide or dying -yellowing of skin or eyes Side effects that usually do not require medical attention (report to your doctor or health care professional if they continue or are bothersome): -change in menstrual cycle -diarrhea or constipation -headache -loss of bladder control -loss of hair or unusual growth of hair -loss or increase in appetite -weight gain or loss This list may not describe all  possible side effects. Call your doctor for medical advice about side effects. You may report side effects to FDA at 1-800-FDA-1088. Where should I keep my medicine? Keep out of reach of children. Store at room temperature below 25 degrees C (77 degrees F). Keep container tightly closed. Throw away any unused medicine after the expiration date. NOTE: This sheet is a summary. It may not cover all possible information. If you have questions about this medicine, talk to your doctor, pharmacist, or health care provider.      2016, Elsevier/Gold Standard. (2012-06-03 11:53:28)
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
## 2016-01-03 ENCOUNTER — Telehealth: Payer: Self-pay | Admitting: Physical Medicine & Rehabilitation

## 2016-01-03 DIAGNOSIS — S8782XS Crushing injury of left lower leg, sequela: Secondary | ICD-10-CM | POA: Diagnosis not present

## 2016-01-03 DIAGNOSIS — S82871S Displaced pilon fracture of right tibia, sequela: Secondary | ICD-10-CM | POA: Diagnosis not present

## 2016-01-03 DIAGNOSIS — S82872S Displaced pilon fracture of left tibia, sequela: Secondary | ICD-10-CM | POA: Diagnosis not present

## 2016-01-03 DIAGNOSIS — S8781XS Crushing injury of right lower leg, sequela: Secondary | ICD-10-CM | POA: Diagnosis not present

## 2016-01-03 DIAGNOSIS — S82401S Unspecified fracture of shaft of right fibula, sequela: Secondary | ICD-10-CM | POA: Diagnosis not present

## 2016-01-03 DIAGNOSIS — S82402S Unspecified fracture of shaft of left fibula, sequela: Secondary | ICD-10-CM | POA: Diagnosis not present
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
## 2016-01-06 DIAGNOSIS — S82872S Displaced pilon fracture of left tibia, sequela: Secondary | ICD-10-CM | POA: Diagnosis not present

## 2016-01-06 DIAGNOSIS — S82402S Unspecified fracture of shaft of left fibula, sequela: Secondary | ICD-10-CM | POA: Diagnosis not present

## 2016-01-06 DIAGNOSIS — S82871S Displaced pilon fracture of right tibia, sequela: Secondary | ICD-10-CM | POA: Diagnosis not present

## 2016-01-06 DIAGNOSIS — S8781XS Crushing injury of right lower leg, sequela: Secondary | ICD-10-CM | POA: Diagnosis not present

## 2016-01-06 DIAGNOSIS — S8782XS Crushing injury of left lower leg, sequela: Secondary | ICD-10-CM | POA: Diagnosis not present

## 2016-01-06 DIAGNOSIS — S82401S Unspecified fracture of shaft of right fibula, sequela: Secondary | ICD-10-CM | POA: Diagnosis not present

## 2016-01-09 DIAGNOSIS — S8781XS Crushing injury of right lower leg, sequela: Secondary | ICD-10-CM | POA: Diagnosis not present

## 2016-01-09 DIAGNOSIS — S82401S Unspecified fracture of shaft of right fibula, sequela: Secondary | ICD-10-CM | POA: Diagnosis not present

## 2016-01-09 DIAGNOSIS — S82871S Displaced pilon fracture of right tibia, sequela: Secondary | ICD-10-CM | POA: Diagnosis not present

## 2016-01-09 DIAGNOSIS — S82402S Unspecified fracture of shaft of left fibula, sequela: Secondary | ICD-10-CM | POA: Diagnosis not present

## 2016-01-09 DIAGNOSIS — S82872S Displaced pilon fracture of left tibia, sequela: Secondary | ICD-10-CM | POA: Diagnosis not present

## 2016-01-09 DIAGNOSIS — S8782XS Crushing injury of left lower leg, sequela: Secondary | ICD-10-CM | POA: Diagnosis not present

## 2016-01-10 DIAGNOSIS — S8781XS Crushing injury of right lower leg, sequela: Secondary | ICD-10-CM | POA: Diagnosis not present

## 2016-01-10 DIAGNOSIS — S82872S Displaced pilon fracture of left tibia, sequela: Secondary | ICD-10-CM | POA: Diagnosis not present

## 2016-01-10 DIAGNOSIS — S82401S Unspecified fracture of shaft of right fibula, sequela: Secondary | ICD-10-CM | POA: Diagnosis not present

## 2016-01-10 DIAGNOSIS — S82402S Unspecified fracture of shaft of left fibula, sequela: Secondary | ICD-10-CM | POA: Diagnosis not present

## 2016-01-10 DIAGNOSIS — S8782XS Crushing injury of left lower leg, sequela: Secondary | ICD-10-CM | POA: Diagnosis not present

## 2016-01-10 DIAGNOSIS — S82871S Displaced pilon fracture of right tibia, sequela: Secondary | ICD-10-CM | POA: Diagnosis not present

## 2016-01-11 ENCOUNTER — Telehealth: Payer: Self-pay

## 2016-01-11 DIAGNOSIS — S82875F Nondisplaced pilon fracture of left tibia, subsequent encounter for open fracture type IIIA, IIIB, or IIIC with routine healing: Secondary | ICD-10-CM | POA: Diagnosis not present

## 2016-01-11 DIAGNOSIS — S82401S Unspecified fracture of shaft of right fibula, sequela: Secondary | ICD-10-CM | POA: Diagnosis not present

## 2016-01-11 DIAGNOSIS — S82872S Displaced pilon fracture of left tibia, sequela: Secondary | ICD-10-CM | POA: Diagnosis not present

## 2016-01-11 DIAGNOSIS — S8781XS Crushing injury of right lower leg, sequela: Secondary | ICD-10-CM | POA: Diagnosis not present

## 2016-01-11 DIAGNOSIS — S82402S Unspecified fracture of shaft of left fibula, sequela: Secondary | ICD-10-CM | POA: Diagnosis not present

## 2016-01-11 DIAGNOSIS — S82461E Displaced segmental fracture of shaft of right fibula, subsequent encounter for open fracture type I or II with routine healing: Secondary | ICD-10-CM | POA: Diagnosis not present

## 2016-01-11 DIAGNOSIS — S82871S Displaced pilon fracture of right tibia, sequela: Secondary | ICD-10-CM | POA: Diagnosis not present

## 2016-01-11 DIAGNOSIS — S8782XS Crushing injury of left lower leg, sequela: Secondary | ICD-10-CM | POA: Diagnosis not present

## 2016-01-11 DIAGNOSIS — S82261F Displaced segmental fracture of shaft of right tibia, subsequent encounter for open fracture type IIIA, IIIB, or IIIC with routine healing: Secondary | ICD-10-CM | POA: Diagnosis not present

## 2016-01-11 NOTE — Telephone Encounter (Signed)
Megan- RN with [facility] called requesting a verbal order for a social worker to come out and arrange transportation for the pt. Left message to approve verbal orders.

## 2016-01-12 DIAGNOSIS — S82871S Displaced pilon fracture of right tibia, sequela: Secondary | ICD-10-CM | POA: Diagnosis not present

## 2016-01-12 DIAGNOSIS — S82401S Unspecified fracture of shaft of right fibula, sequela: Secondary | ICD-10-CM | POA: Diagnosis not present

## 2016-01-12 DIAGNOSIS — S8781XS Crushing injury of right lower leg, sequela: Secondary | ICD-10-CM | POA: Diagnosis not present

## 2016-01-12 DIAGNOSIS — S8782XS Crushing injury of left lower leg, sequela: Secondary | ICD-10-CM | POA: Diagnosis not present

## 2016-01-12 DIAGNOSIS — S82402S Unspecified fracture of shaft of left fibula, sequela: Secondary | ICD-10-CM | POA: Diagnosis not present

## 2016-01-12 DIAGNOSIS — S82872S Displaced pilon fracture of left tibia, sequela: Secondary | ICD-10-CM | POA: Diagnosis not present

## 2016-01-13 ENCOUNTER — Ambulatory Visit (HOSPITAL_BASED_OUTPATIENT_CLINIC_OR_DEPARTMENT_OTHER): Payer: Medicare Other | Admitting: Physical Medicine & Rehabilitation

## 2016-01-13 ENCOUNTER — Encounter: Payer: Medicare Other | Attending: Physical Medicine & Rehabilitation

## 2016-01-13 ENCOUNTER — Encounter: Payer: Self-pay | Admitting: Physical Medicine & Rehabilitation

## 2016-01-13 VITALS — BP 110/5 | HR 95 | Resp 14

## 2016-01-13 DIAGNOSIS — G8250 Quadriplegia, unspecified: Secondary | ICD-10-CM | POA: Diagnosis not present

## 2016-01-13 DIAGNOSIS — Z89511 Acquired absence of right leg below knee: Secondary | ICD-10-CM | POA: Diagnosis not present

## 2016-01-13 DIAGNOSIS — G8220 Paraplegia, unspecified: Secondary | ICD-10-CM | POA: Insufficient documentation

## 2016-01-13 DIAGNOSIS — Z89512 Acquired absence of left leg below knee: Secondary | ICD-10-CM

## 2016-01-13 DIAGNOSIS — Z9889 Other specified postprocedural states: Secondary | ICD-10-CM | POA: Diagnosis not present

## 2016-01-13 DIAGNOSIS — S14157S Other incomplete lesion at C7 level of cervical spinal cord, sequela: Secondary | ICD-10-CM

## 2016-01-13 DIAGNOSIS — N319 Neuromuscular dysfunction of bladder, unspecified: Secondary | ICD-10-CM | POA: Insufficient documentation

## 2016-01-13 DIAGNOSIS — F17210 Nicotine dependence, cigarettes, uncomplicated: Secondary | ICD-10-CM | POA: Insufficient documentation

## 2016-01-13 NOTE — Patient Instructions (Addendum)
Have made a follow-up appointment with you in 2 months Continue baclofen 10 mg 4 times per day  Family practice will need to see you as well  Neurologist in 6 months  Dr. Carola FrostHandy is your surgeon, you will see him in 2 weeks. He will take care of your wounds.
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
## 2016-01-16 DIAGNOSIS — S82402S Unspecified fracture of shaft of left fibula, sequela: Secondary | ICD-10-CM | POA: Diagnosis not present

## 2016-01-16 DIAGNOSIS — S82872S Displaced pilon fracture of left tibia, sequela: Secondary | ICD-10-CM | POA: Diagnosis not present

## 2016-01-16 DIAGNOSIS — S82871S Displaced pilon fracture of right tibia, sequela: Secondary | ICD-10-CM | POA: Diagnosis not present

## 2016-01-16 DIAGNOSIS — S82401S Unspecified fracture of shaft of right fibula, sequela: Secondary | ICD-10-CM | POA: Diagnosis not present

## 2016-01-16 DIAGNOSIS — S8781XS Crushing injury of right lower leg, sequela: Secondary | ICD-10-CM | POA: Diagnosis not present

## 2016-01-16 DIAGNOSIS — S8782XS Crushing injury of left lower leg, sequela: Secondary | ICD-10-CM | POA: Diagnosis not present

## 2016-01-19 DIAGNOSIS — S8782XS Crushing injury of left lower leg, sequela: Secondary | ICD-10-CM | POA: Diagnosis not present

## 2016-01-19 DIAGNOSIS — S8781XS Crushing injury of right lower leg, sequela: Secondary | ICD-10-CM | POA: Diagnosis not present

## 2016-01-19 DIAGNOSIS — S82872S Displaced pilon fracture of left tibia, sequela: Secondary | ICD-10-CM | POA: Diagnosis not present

## 2016-01-19 DIAGNOSIS — S82402S Unspecified fracture of shaft of left fibula, sequela: Secondary | ICD-10-CM | POA: Diagnosis not present

## 2016-01-19 DIAGNOSIS — S82871S Displaced pilon fracture of right tibia, sequela: Secondary | ICD-10-CM | POA: Diagnosis not present

## 2016-01-19 DIAGNOSIS — S82401S Unspecified fracture of shaft of right fibula, sequela: Secondary | ICD-10-CM | POA: Diagnosis not present

## 2016-01-26 DIAGNOSIS — S8782XS Crushing injury of left lower leg, sequela: Secondary | ICD-10-CM | POA: Diagnosis not present

## 2016-01-26 DIAGNOSIS — S82401S Unspecified fracture of shaft of right fibula, sequela: Secondary | ICD-10-CM | POA: Diagnosis not present

## 2016-01-26 DIAGNOSIS — S82871S Displaced pilon fracture of right tibia, sequela: Secondary | ICD-10-CM | POA: Diagnosis not present

## 2016-01-26 DIAGNOSIS — S82872S Displaced pilon fracture of left tibia, sequela: Secondary | ICD-10-CM | POA: Diagnosis not present

## 2016-01-26 DIAGNOSIS — S8781XS Crushing injury of right lower leg, sequela: Secondary | ICD-10-CM | POA: Diagnosis not present

## 2016-01-26 DIAGNOSIS — S82402S Unspecified fracture of shaft of left fibula, sequela: Secondary | ICD-10-CM | POA: Diagnosis not present

## 2016-02-16 ENCOUNTER — Telehealth: Payer: Self-pay | Admitting: Adult Health

## 2016-02-16 NOTE — Telephone Encounter (Signed)
error 

## 2016-02-17 ENCOUNTER — Ambulatory Visit: Payer: Self-pay | Admitting: Physical Medicine & Rehabilitation

## 2016-02-29 DIAGNOSIS — S82875F Nondisplaced pilon fracture of left tibia, subsequent encounter for open fracture type IIIA, IIIB, or IIIC with routine healing: Secondary | ICD-10-CM | POA: Diagnosis not present

## 2016-03-12 ENCOUNTER — Telehealth: Payer: Self-pay | Admitting: *Deleted

## 2016-03-12 NOTE — Telephone Encounter (Signed)
Taken 22-MAY-17 at  1:20PM by DEF ------------------------------------------------------------ [name] PHYS   CID [id number]  Patient [name]       [facility]      Area Code 336 Phone# [phone] * DOB [phone]     RE DOES HE HAVE AN APPT SCHEDULED                                                                         Disp:Y/N N If Y = C/B If No Response In 20minutes

## 2016-03-15 ENCOUNTER — Ambulatory Visit (HOSPITAL_BASED_OUTPATIENT_CLINIC_OR_DEPARTMENT_OTHER): Payer: Medicare Other | Admitting: Physical Medicine & Rehabilitation

## 2016-03-15 ENCOUNTER — Encounter: Payer: Self-pay | Admitting: Physical Medicine & Rehabilitation

## 2016-03-15 ENCOUNTER — Encounter: Payer: Medicare Other | Attending: Physical Medicine & Rehabilitation

## 2016-03-15 VITALS — BP 107/57 | HR 83 | Resp 14

## 2016-03-15 DIAGNOSIS — Z89511 Acquired absence of right leg below knee: Secondary | ICD-10-CM

## 2016-03-15 DIAGNOSIS — G8220 Paraplegia, unspecified: Secondary | ICD-10-CM | POA: Insufficient documentation

## 2016-03-15 DIAGNOSIS — Z89512 Acquired absence of left leg below knee: Secondary | ICD-10-CM | POA: Insufficient documentation

## 2016-03-15 DIAGNOSIS — N319 Neuromuscular dysfunction of bladder, unspecified: Secondary | ICD-10-CM | POA: Insufficient documentation

## 2016-03-15 DIAGNOSIS — G8250 Quadriplegia, unspecified: Secondary | ICD-10-CM

## 2016-03-15 DIAGNOSIS — F17210 Nicotine dependence, cigarettes, uncomplicated: Secondary | ICD-10-CM | POA: Insufficient documentation

## 2016-03-15 DIAGNOSIS — Z9889 Other specified postprocedural states: Secondary | ICD-10-CM | POA: Insufficient documentation

## 2016-03-15 MED ORDER — BACLOFEN 20 MG PO TABS: 20.0000 mg | ORAL_TABLET | Freq: Four times a day (QID) | ORAL | Status: DC

## 2016-03-15 NOTE — Patient Instructions (Addendum)
Need to see a Primary care physician- Suggest [facility]  Need a new wheelchair, PT will coordinate  No driving until cleared by Dr Vickey Hugerohmeier  Increase Baclofen to 20mg  for spasms
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
## 2016-03-26 ENCOUNTER — Telehealth: Payer: Self-pay | Admitting: Neurology

## 2016-03-26 NOTE — Telephone Encounter (Signed)
He can purchase , but he cannot drive !!!

## 2016-03-26 NOTE — Telephone Encounter (Signed)
I called pt. I spoke to pt and advised him that Dr. Vickey Hugerohmeier said he may purchase the vehicle but he cannot drive it at this time. Pt verbalized understanding. Pt assured me that he "doesn't break the law, never have, never will" and will not drive.

## 2016-03-26 NOTE — Telephone Encounter (Signed)
I called pt to discuss. No answer, left a message asking him to call me back. 

## 2016-03-26 NOTE — Telephone Encounter (Signed)
Pt called inquiring if he can purchase a handicap vehicle prior to be seen in September 2017.

## 2016-03-26 NOTE — Telephone Encounter (Signed)
I spoke to pt. He says that Dr. Vickey Hugerohmeier advised him not to drive until September when he came back for his follow up. Pt wants to know if he may purchase a handicap accessible vehicle in the interim, even though he is not allowed to drive.  I advised pt that I would speak to Dr. Vickey Hugerohmeier and call pt back with her recommendations.

## 2016-04-12 DIAGNOSIS — N311 Reflex neuropathic bladder, not elsewhere classified: Secondary | ICD-10-CM | POA: Diagnosis not present

## 2016-04-16 ENCOUNTER — Telehealth: Payer: Self-pay | Admitting: Physical Medicine & Rehabilitation

## 2016-04-16 NOTE — Telephone Encounter (Signed)
Is this a question for his Neurologist? Please advise.

## 2016-04-16 NOTE — Telephone Encounter (Signed)
This is for his neurologist since he had a seizure

## 2016-04-16 NOTE — Telephone Encounter (Signed)
Patient is unsure who he needs to talk about his disability and capability to drive again.  If someone could please call him at [phone].

## 2016-04-16 NOTE — Telephone Encounter (Signed)
Called patient and let him know.

## 2016-04-20 ENCOUNTER — Other Ambulatory Visit: Payer: Self-pay | Admitting: Neurology

## 2016-04-20 DIAGNOSIS — R561 Post traumatic seizures: Secondary | ICD-10-CM

## 2016-04-20 DIAGNOSIS — G8250 Quadriplegia, unspecified: Secondary | ICD-10-CM

## 2016-04-20 DIAGNOSIS — G40909 Epilepsy, unspecified, not intractable, without status epilepticus: Secondary | ICD-10-CM

## 2016-04-20 MED ORDER — DIVALPROEX SODIUM ER 500 MG PO TB24: ORAL_TABLET | ORAL | Status: DC

## 2016-04-20 NOTE — Telephone Encounter (Signed)
Pt called after hours for Depakote refill. Reminded patient to try to get refills during office visit and regular work hours. Refill done and sent to pharm.

## 2016-04-23 ENCOUNTER — Telehealth: Payer: Self-pay

## 2016-04-23 NOTE — Telephone Encounter (Signed)
He states he is being pro-active for when he is cleared to drive in September FYI.

## 2016-04-23 NOTE — Telephone Encounter (Signed)
Pt called requesting [place] to think about ordering a device that will help him drive. He was requesting a device-like an extension to help him balance? Please advise?

## 2016-04-30 NOTE — Telephone Encounter (Signed)
Mr [name] notified.  He has an appt to see Dr Vickey Hugerohmeier tomorrow.

## 2016-04-30 NOTE — Telephone Encounter (Signed)
I think he will first need clearance from neurology. That is not a done deal yet. We can then address any type of adaptive equipment or other type of driving evaluation at that time.

## 2016-05-01 ENCOUNTER — Telehealth: Payer: Self-pay

## 2016-05-01 ENCOUNTER — Ambulatory Visit: Payer: Medicare Other | Admitting: Adult Health

## 2016-05-01 NOTE — Telephone Encounter (Signed)
I called patient because he was put on Megan's schedule today. But patient needs to see Darrol Angelarolyn Martin NP, who has seen him before. Especially since he is looking for clearance to drive. Patient relies on someone else for transportation so he needs to call them and check to see if he can change appt. He will call us back to reschedule.

## 2016-05-02 NOTE — Telephone Encounter (Signed)
Patient called to reschedule appointment with [name] but could not come up with a good time because of transportation. He will call back to reschedule. See message below.

## 2016-05-03 ENCOUNTER — Ambulatory Visit: Payer: Medicare Other | Admitting: Adult Health

## 2016-05-09 ENCOUNTER — Ambulatory Visit (INDEPENDENT_AMBULATORY_CARE_PROVIDER_SITE_OTHER): Payer: Medicare Other | Admitting: Nurse Practitioner

## 2016-05-09 ENCOUNTER — Encounter: Payer: Self-pay | Admitting: Nurse Practitioner

## 2016-05-09 VITALS — BP 93/56 | HR 89 | Ht 74.0 in | Wt 140.0 lb

## 2016-05-09 DIAGNOSIS — R569 Unspecified convulsions: Secondary | ICD-10-CM

## 2016-05-09 DIAGNOSIS — G40909 Epilepsy, unspecified, not intractable, without status epilepticus: Secondary | ICD-10-CM | POA: Diagnosis not present

## 2016-05-09 DIAGNOSIS — G40309 Generalized idiopathic epilepsy and epileptic syndromes, not intractable, without status epilepticus: Secondary | ICD-10-CM

## 2016-05-09 DIAGNOSIS — N319 Neuromuscular dysfunction of bladder, unspecified: Secondary | ICD-10-CM

## 2016-05-09 DIAGNOSIS — G8250 Quadriplegia, unspecified: Secondary | ICD-10-CM

## 2016-05-09 MED ORDER — DIVALPROEX SODIUM ER 500 MG PO TB24: ORAL_TABLET | ORAL | Status: DC
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
## 2016-05-09 NOTE — Patient Instructions (Signed)
Continue Depakote at current dose will refill Call for seizure activity Follow-up in 6 months

## 2016-05-10 ENCOUNTER — Telehealth: Payer: Self-pay | Admitting: *Deleted

## 2016-05-10 NOTE — Telephone Encounter (Signed)
Attempted to reach patient for pharmacy clarification. Home Phone was answered then had screeching sound. Reached his mother who stated he [place] [employer] [place] [employer]. Will fax prescription to that facility.

## 2016-05-10 NOTE — Progress Notes (Signed)
I agree with the assessment and plan as directed by NP .The patient is known to me .   Jovani Flury, MD  

## 2016-05-28 ENCOUNTER — Telehealth: Payer: Self-pay | Admitting: Nurse Practitioner

## 2016-05-28 NOTE — Telephone Encounter (Signed)
Pt called said he has talked with DOT/[place] today. Pt said he has not heard anything and said DOT/[place] is wondering why they have not rec'd any paperwork that indicates he can drive.

## 2016-05-29 NOTE — Telephone Encounter (Signed)
Spoke to Rose[place]ra in [place].  She contacted pt and DMV.  DMV to fax form to us.

## 2016-06-04 ENCOUNTER — Encounter: Payer: Self-pay | Admitting: Adult Health

## 2016-06-04 ENCOUNTER — Encounter: Payer: Medicare Other | Admitting: Adult Health

## 2016-06-04 NOTE — Progress Notes (Signed)
This encounter was created in error - please disregard.

## 2016-06-05 ENCOUNTER — Telehealth: Payer: Self-pay | Admitting: Neurology

## 2016-06-05 NOTE — Telephone Encounter (Signed)
Pt called in to make sure that his DMV form was handed over to Dr. Vickey Hugerohmeier and that medication changes are documented correctly. Please call and advise

## 2016-06-07 NOTE — Telephone Encounter (Signed)
Received, completed and forwarded for review and signature to CM/NP.

## 2016-06-08 ENCOUNTER — Telehealth: Payer: Self-pay | Admitting: *Deleted

## 2016-06-08 NOTE — Telephone Encounter (Signed)
Pt dmv form at the front desk for pick up.

## 2016-06-11 NOTE — Telephone Encounter (Signed)
Sandy,  I am not sure what Eber JonesCarolyn or you filled out on the pt's DMV form. Can you call this pt and discuss what was [facility]? It is not scanned into media yet.

## 2016-06-11 NOTE — Telephone Encounter (Signed)
Patient is calling and has a question about the DMV form he just picked up. What does n/a stand for. Please call and discuss.

## 2016-06-11 NOTE — Telephone Encounter (Signed)
Called pt back. Advised n/a stood for not applicable and this just means this does not pertain to him. He understands. He does not understand why on the form it does not say anywhere that he is doing well. He stated he sees where they stated he has followed medical advice and does not need routine road tests. He also stated "sitting around the house will do me no good". I offered for Dr Dohmeier's nurse to call him to discuss since I saw she has spoken with him in the past about this. He verbalized that he would like her to cal him. Advised I will send message to her.

## 2016-06-12 NOTE — Telephone Encounter (Signed)
I spoke with pt and he will be seeing Dr. Wynn BankerKirsteins this week.  I told him to f/u with him about driving and other questions he may have.  I went over the form with him.   He stated he did have some testing whe he was at the hospital.   He also has handicap car.  I did change the form at all.  He was ok with what was on the form.

## 2016-06-15 ENCOUNTER — Encounter: Payer: Medicare Other | Attending: Physical Medicine & Rehabilitation

## 2016-06-15 ENCOUNTER — Encounter: Payer: Self-pay | Admitting: Physical Medicine & Rehabilitation

## 2016-06-15 ENCOUNTER — Ambulatory Visit (HOSPITAL_BASED_OUTPATIENT_CLINIC_OR_DEPARTMENT_OTHER): Payer: Medicare Other | Admitting: Physical Medicine & Rehabilitation

## 2016-06-15 VITALS — BP 106/74 | HR 77 | Resp 14

## 2016-06-15 DIAGNOSIS — F17210 Nicotine dependence, cigarettes, uncomplicated: Secondary | ICD-10-CM | POA: Diagnosis not present

## 2016-06-15 DIAGNOSIS — R569 Unspecified convulsions: Secondary | ICD-10-CM | POA: Insufficient documentation

## 2016-06-15 DIAGNOSIS — Z89511 Acquired absence of right leg below knee: Secondary | ICD-10-CM

## 2016-06-15 DIAGNOSIS — Z89512 Acquired absence of left leg below knee: Secondary | ICD-10-CM

## 2016-06-15 DIAGNOSIS — S14107D Unspecified injury at C7 level of cervical spinal cord, subsequent encounter: Secondary | ICD-10-CM | POA: Diagnosis not present

## 2016-06-15 DIAGNOSIS — X58XXXD Exposure to other specified factors, subsequent encounter: Secondary | ICD-10-CM | POA: Insufficient documentation

## 2016-06-15 DIAGNOSIS — G8250 Quadriplegia, unspecified: Secondary | ICD-10-CM

## 2016-06-15 NOTE — Progress Notes (Signed)
Subjective:  45 year old male status post bilateral below-knee amputations in February 2017.  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  Patient ID: [name], male    DOB: May 24, 1971, 45 y.o.   MRN: [id number]  HPI The patient was last seen by me in clinic 03/15/2016. At that time, we discussed getting a different wheelchair. However, he seems quite satisfied with his current chair. He is wheeling himself in the morning for exercise down the street before there is car traffic. He is independent with his dressing and bathing. His neurologist is addressing his driving situation, given his seizure in February. He reportedly has had no seizures since that time. He continues to do intermittent catheterization independently in the evening, during the day. He wears a condom cath. His urologist is Dr. Vernie Ammonsttelin who orders his urology supplies  For his bowels, patient does not require any type of enema From a skin standpoint, he states he still has a small open area, but this is become smaller and he just puts a absorptive dressing over this. No recent UTIs No recent falls Pain Inventory Average Pain 0 Pain Right Now 0 My pain is no pain  In the last 24 hours, has pain interfered with the following? General activity 0 Relation with others 0 Enjoyment of life 0 What TIME of day is your pain at its worst? no pain Sleep (in general) Good  Pain is worse with: no pain Pain improves with: no pain Relief from Meds: no pain  Mobility use a wheelchair  Function disabled: date disabled .  Neuro/Psych No problems in this area  Prior Studies Any changes since last visit?  no  Physicians involved in your care Any changes since last visit?  no   Family History  Problem Relation Age of Onset  . Hypertension Mother    Social History   Social History  . Marital status: Single    Spouse name: N/A  . Number of children: N/A  . Years of education:  N/A   Occupational History  . Disable    Social History Main Topics  . Smoking status: Current Every Day Smoker    Packs/day: 0.08    Years: 21.00    Types: Cigarettes  . Smokeless tobacco: Never Used  . Alcohol use No  . Drug use: No  . Sexual activity: Not Asked   Other Topics Concern  . None   Social History Narrative  . None   Past Surgical History:  Procedure Laterality Date  . AMPUTATION Bilateral 12/20/2015   Procedure: AMPUTATION BILATERAL BELOW KNEE;  Surgeon: Myrene GalasMichael Handy, MD;  [facility];  Service: Orthopedics;  Laterality: Bilateral;  . APPLICATION OF WOUND VAC Bilateral 12/08/2015   Procedure: APPLICATION OF WOUND VAC BILATERAL LEGS ;  Surgeon: Tarry KosNaiping M Xu, MD;  Location: [facility] OR;  Service: Orthopedics;  Laterality: Bilateral;  . APPLICATION OF WOUND VAC Bilateral 12/15/2015   Procedure: APPLICATION OF WOUND VAC;  Surgeon: Myrene GalasMichael Handy, MD;  [facility];  Service: Orthopedics;  Laterality: Bilateral;  . BLADDER SURGERY    . EXTERNAL FIXATION LEG Bilateral 12/08/2015   Procedure: EXTERNAL FIXATION BILATERAL TIBIA/FIBULA;  Surgeon: Tarry KosNaiping M Xu, MD;  Location: [facility] OR;  Service: Orthopedics;  Laterality: Bilateral;  . EXTERNAL FIXATION REMOVAL Bilateral 12/15/2015   Procedure: REMOVAL EXTERNAL FIXATION LEG;  Surgeon: Myrene GalasMichael Handy, MD;  [facility];  Service: Orthopedics;  Laterality: Bilateral;  . I&D EXTREMITY Bilateral 12/08/2015   Procedure: IRRIGATION AND DEBRIDEMENT BILATERAL  TIBIA/FIBULA;  Surgeon: Tarry Kos, MD;  Location: [facility] OR;  Service: Orthopedics;  Laterality: Bilateral;  . I&D EXTREMITY Left 12/11/2015   Procedure: IRRIGATION AND DEBRIDEMENT LEG;  Surgeon: Tarry Kos, MD;  Location: [facility] OR;  Service: Orthopedics;  Laterality: Left;  . INCISION AND DRAINAGE Bilateral 12/15/2015   Procedure: INCISION AND DRAINAGE BILATERAL LEG WOUNDS;  Surgeon: Myrene Galas, MD;  Location: [facility];  Service: Orthopedics;  Laterality: Bilateral;  . INCISION AND  DRAINAGE PERIRECTAL ABSCESS    . PERCUTANEOUS PINNING Bilateral 12/15/2015   Procedure: PERCUTANEOUS PINNING EXTREMITY  BILATERAL TIBIAL FRACTURES AND LEFT ANKLE FRACTURE FOR TEMPORARY STABILIZATION;  Surgeon: Myrene Galas, MD;  [facility]: [facility] OR;  Service: Orthopedics;  Laterality: Bilateral;  . SPINE SURGERY     Past Medical History:  Diagnosis Date  . C5 spinal cord injury (HCC)   . 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 Quadriplegia and quadriparesis (HCC) 08/12/2013  . Seizures (HCC)    focal  . Tobacco use 03/20/2012   BP 106/74 (BP Location: Left Arm, Patient Position: Sitting, Cuff Size: Normal)   Pulse 77   Resp 14   SpO2 97%   Opioid Risk Score:   Fall Risk Score:  `1  Depression screen PHQ 2/9  Depression screen [facility] 2/9 01/02/2016 07/25/2015 01/25/2015  Decreased Interest 1 0 3  Down, Depressed, Hopeless 1 0 1  PHQ - 2 Score 2 0 4  Altered sleeping 2 - 2  Tired, decreased energy 2 - 0  Change in appetite 1 - 0  Feeling bad or failure about yourself  1 - 0  Trouble concentrating 2 - 0  Moving slowly or fidgety/restless 1 - 1  Suicidal thoughts 0 - 0  PHQ-9 Score 11 - 7  Difficult doing work/chores Somewhat difficult - -   2 Review of Systems  Constitutional: Negative.   HENT: Negative.   Eyes: Negative.   Respiratory: Negative.   Cardiovascular: Negative.   Gastrointestinal: Negative.   Endocrine: Negative.   Genitourinary: Negative.   Musculoskeletal: Negative.   Skin: Negative.   Allergic/Immunologic: Negative.   Neurological: Negative.   Hematological: Negative.   Psychiatric/Behavioral: Negative.   All other systems reviewed and are negative.      Objective:   Physical Exam  Constitutional: He is oriented to person, place, and time. He appears well-developed and well-nourished.  HENT:  Head: Normocephalic and atraumatic.  Eyes: Conjunctivae and EOM are normal. Pupils are equal, round,  and reactive to light.  Neck: Normal range of motion.  Neurological: He is alert and oriented to person, place, and time. He displays atrophy. A sensory deficit is present. Coordination abnormal.  Patient has 3+ finger flexors 4 minus triceps, 5 biceps Decreased truncal stability No active motion in both lower limbs. He has evidence of clonus in the left lower limb. When checking muscle stretch reflexes. Bilateral hand intrinsic atrophy Decreased fine motor abilities in both hands  Nonambulatory  Psychiatric: He has a normal mood and affect.  Nursing note and vitals reviewed.  Bilateral BKA's are well-healed.       Assessment & Plan:  1. Chronic C7 complete spinal cord injury dating from 1999. He still has severe spasticity, particularly in the lower limbs, left greater than right. We discussed treatment options for his lower limb spasticity. He has been prescribed baclofen and in the past. However, he seems reluctant to use it. We discussed that other treatment options include  Botox, which would involve injection of the quadriceps as well as the hamstrings on the left side. He states he will think about it and call us if he decides to pursue that option. 2. Status post bilateral BKA. Ideally, he would be in a wheelchair with a axle that is moved posteriorly from the current location. Patient, however, feels like he is doing quite well on the current chair and has anti--tip bars that can be used as needed. Patient denies any falls from the wheelchair. 3. 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

## 2016-06-15 NOTE — Patient Instructions (Signed)
If you want botox injection for left leg spasms, please call to schedule

## 2016-06-28 ENCOUNTER — Telehealth: Payer: Self-pay | Admitting: Neurology

## 2016-06-28 ENCOUNTER — Telehealth: Payer: Self-pay

## 2016-06-28 DIAGNOSIS — G40909 Epilepsy, unspecified, not intractable, without status epilepticus: Secondary | ICD-10-CM
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
## 2016-06-28 NOTE — Telephone Encounter (Signed)
Pt's mother called to speak with Dr. Wynn BankerKirsteins. She states that [name] has had approximately 1 seizure per month and she is uncomfortable with him driving. [facility] since they were the one's to clear the pt to drive. I also advised her that I would make Dr. Wynn BankerKirsteins aware of his seizure activity.
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
## 2016-06-29 MED ORDER — DIVALPROEX SODIUM ER 500 MG PO TB24: 1000.0000 mg | ORAL_TABLET | Freq: Two times a day (BID) | ORAL | 6 refills | Status: DC

## 2016-06-29 NOTE — Telephone Encounter (Signed)
noted 
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
## 2016-06-29 NOTE — Telephone Encounter (Signed)
Received fax confirmation for depakote er to [place] [employer].

## 2016-07-02 ENCOUNTER — Ambulatory Visit: Payer: Medicare Other | Admitting: Adult Health

## 2016-07-13 NOTE — Telephone Encounter (Signed)
Called pt and he stated he would call back in 5 minutes.

## 2016-07-13 NOTE — Telephone Encounter (Signed)
Pt called back, states someone just called and he told them he would call back, please call [phone].
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
## 2016-07-16 ENCOUNTER — Ambulatory Visit (INDEPENDENT_AMBULATORY_CARE_PROVIDER_SITE_OTHER): Payer: PRIVATE HEALTH INSURANCE | Admitting: Nurse Practitioner

## 2016-07-16 ENCOUNTER — Encounter: Payer: Self-pay | Admitting: Nurse Practitioner

## 2016-07-16 VITALS — BP 96/62 | HR 80 | Ht 74.0 in

## 2016-07-16 DIAGNOSIS — R569 Unspecified convulsions: Secondary | ICD-10-CM

## 2016-07-16 DIAGNOSIS — G40309 Generalized idiopathic epilepsy and epileptic syndromes, not intractable, without status epilepticus: Secondary | ICD-10-CM | POA: Diagnosis not present

## 2016-07-16 DIAGNOSIS — G8250 Quadriplegia, unspecified: Secondary | ICD-10-CM

## 2016-07-16 DIAGNOSIS — Z5181 Encounter for therapeutic drug level monitoring: Secondary | ICD-10-CM | POA: Diagnosis not present

## 2016-07-16 NOTE — Patient Instructions (Addendum)
Increase Depakote to 5 tabs daily Will obtain CBC, CMP and VPA level No driving for 6 months Please remember, common seizure triggers are: Sleep deprivation, dehydration, overheating, stress, hypoglycemia or skipping meals, certain medications or excessive alcohol use, especially stopping alcohol abruptly if you have had heavy alcohol use before (aka alcohol withdrawal seizure). If you have a prolonged seizure over 2-5 minutes or back to back seizures, call or have someone call 911 or take you to the nearest emergency room. You cannot drive a car or operate any other machinery or vehicle within 6 months of a seizure.  Take your medicine for seizure prevention regularly and do not skip doses or stop medication abruptly. Try to get a refill on your antiepileptic medication ahead of time, so you are not at risk of running out. If you run out of the seizure medication and do not have a refill at hand she may run into medication withdrawal seizures. Avoid taking Wellbutrin, narcotic pain medications and tramadol, as they can lower seizure threshold.
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
## 2016-07-17 ENCOUNTER — Telehealth: Payer: Self-pay | Admitting: *Deleted

## 2016-07-17 LAB — CBC WITH DIFFERENTIAL/PLATELET
Basophils Absolute: 0 10*3/uL (ref 0.0–0.2)
Basos: 0 %
EOS (ABSOLUTE): 0.1 10*3/uL (ref 0.0–0.4)
Eos: 2 %
Hematocrit: 43.4 % (ref 37.5–51.0)
Hemoglobin: 14.9 g/dL (ref 12.6–17.7)
Immature Grans (Abs): 0 10*3/uL (ref 0.0–0.1)
Immature Granulocytes: 0 %
Lymphocytes Absolute: 1.7 10*3/uL (ref 0.7–3.1)
Lymphs: 29 %
MCH: 30.9 pg (ref 26.6–33.0)
MCHC: 34.3 g/dL (ref 31.5–35.7)
MCV: 90 fL (ref 79–97)
Monocytes Absolute: 0.9 10*3/uL (ref 0.1–0.9)
Monocytes: 15 %
Neutrophils Absolute: 3 10*3/uL (ref 1.4–7.0)
Neutrophils: 54 %
Platelets: 154 10*3/uL (ref 150–379)
RBC: 4.82 x10E6/uL (ref 4.14–5.80)
RDW: 14.9 % (ref 12.3–15.4)
WBC: 5.7 10*3/uL (ref 3.4–10.8)

## 2016-07-17 LAB — COMPREHENSIVE METABOLIC PANEL
ALT: 11 IU/L (ref 0–44)
AST: 17 IU/L (ref 0–40)
Albumin/Globulin Ratio: 0.9 — ABNORMAL LOW (ref 1.2–2.2)
Albumin: 3.3 g/dL — ABNORMAL LOW (ref 3.5–5.5)
Alkaline Phosphatase: 81 IU/L (ref 39–117)
BUN/Creatinine Ratio: 23 — ABNORMAL HIGH (ref 9–20)
BUN: 16 mg/dL (ref 6–24)
Bilirubin Total: 0.4 mg/dL (ref 0.0–1.2)
CO2: 21 mmol/L (ref 18–29)
Calcium: 8.8 mg/dL (ref 8.7–10.2)
Chloride: 100 mmol/L (ref 96–106)
Creatinine, Ser: 0.69 mg/dL — ABNORMAL LOW (ref 0.76–1.27)
GFR calc Af Amer: 133 mL/min/{1.73_m2} (ref >59)
GFR calc non Af Amer: 115 mL/min/{1.73_m2} (ref >59)
Globulin, Total: 3.8 g/dL (ref 1.5–4.5)
Glucose: 70 mg/dL (ref 65–99)
Potassium: 4.9 mmol/L (ref 3.5–5.2)
Sodium: 138 mmol/L (ref 134–144)
Total Protein: 7.1 g/dL (ref 6.0–8.5)

## 2016-07-17 LAB — VALPROIC ACID LEVEL: Valproic Acid Lvl: 65 ug/mL (ref 50–100)

## 2016-07-17 NOTE — Telephone Encounter (Signed)
-----   Message from Nilda RiggsNancy Carolyn Martin, NP sent at 07/17/2016 11:47 AM EDT ----- Labs ok VPA level 65 which is therapeutic. Dose increased yesterday. Repeat level in 10 days

## 2016-07-17 NOTE — Telephone Encounter (Signed)
Spoke to pt and relayed the lab results.  He will come in 07-27-16 at 0815 to get trough level of depakote.  Pt verbalized understanding.

## 2016-07-17 NOTE — Telephone Encounter (Signed)
DMV letter faxed with confirmation [phone] [facility]request for Driver Re- Examination.

## 2016-08-09 ENCOUNTER — Telehealth: Payer: Self-pay | Admitting: *Deleted

## 2016-08-09 ENCOUNTER — Other Ambulatory Visit: Payer: Self-pay

## 2016-08-09 ENCOUNTER — Telehealth: Payer: Self-pay | Admitting: Neurology

## 2016-08-09 DIAGNOSIS — Z5181 Encounter for therapeutic drug level monitoring: Secondary | ICD-10-CM

## 2016-08-09 NOTE — Telephone Encounter (Signed)
Pt also has not come back for the repeat depakote level per instructions.  And it also appears that Dr. Vickey Hugerohmeier might have tried to dismiss pt per Carolyn's last note.

## 2016-08-09 NOTE — Telephone Encounter (Signed)
DMV does driving evaluations. He did not get repeat VPA. He was advised at last visit no driving for 6 months.

## 2016-08-09 NOTE — Telephone Encounter (Signed)
I spoke to pt. I advised him that it would be the DMV that does driving evaluations and that he is not to drive for 6 months. Pt verbalized understanding. I also advised him that he did not get his VPA level redrawn. Pt said that he was never told to come in and get it rechecked. I advised him that he should come in to the clinic during normal business hours to have it checked. Pt verbalized understanding. I also reiterated the importance of the pt coming for his appt on 11/29 with Dr. Vickey Hugerohmeier. Pt verbalized understanding.

## 2016-08-09 NOTE — Telephone Encounter (Signed)
Andrey CampanileSandy,  I know that Eber JonesCarolyn [facility], but I don't know about the driving evaluation. Isn't that done at the [facility]? Or was that a part of the forms you filled out on him for [facility]?

## 2016-08-09 NOTE — Telephone Encounter (Signed)
[name] is calling to ask if he will be able to drive after his 8/29/562/26/18 appointment. Please advise.

## 2016-08-09 NOTE — Telephone Encounter (Signed)
I am not the doctor making the decision on his driving. That would be his neurologist, Dr. Vickey Hugerohmeier

## 2016-08-09 NOTE — Telephone Encounter (Signed)
Mr [name] notifed

## 2016-08-09 NOTE — Telephone Encounter (Signed)
Patient called to ask if Dr. Vickey Hugerohmeier or Dr. Wynn BankerKirsteins will be the one to do the 6 month driving evaluation? Please call [phone] to advise.

## 2016-08-15 ENCOUNTER — Ambulatory Visit: Payer: Medicare Other | Admitting: Neurology

## 2016-08-17 ENCOUNTER — Other Ambulatory Visit (INDEPENDENT_AMBULATORY_CARE_PROVIDER_SITE_OTHER): Payer: Self-pay

## 2016-08-17 DIAGNOSIS — Z0289 Encounter for other administrative examinations: Secondary | ICD-10-CM

## 2016-08-17 DIAGNOSIS — Z5181 Encounter for therapeutic drug level monitoring: Secondary | ICD-10-CM | POA: Diagnosis not present

## 2016-08-18 LAB — VALPROIC ACID LEVEL: Valproic Acid Lvl: 76 ug/mL (ref 50–100)

## 2016-08-21 ENCOUNTER — Telehealth: Payer: Self-pay | Admitting: *Deleted

## 2016-08-21 NOTE — Telephone Encounter (Signed)
Per Enid Skeens Martin, NP spoke with patient and informed him that his valproic acid level is good. Advised he continue taking the same dose he is currently taking. He repeated these instructions correctly, verbalized understanding, appreciation.

## 2016-09-18 ENCOUNTER — Telehealth: Payer: Self-pay

## 2016-09-18 NOTE — Telephone Encounter (Signed)
Rn receive incoming call patients sister. PTs sister stated she is not the HCPOA, but her mom is. The sister states her brother has an appt with Dr. Vickey Hugerohmeier. They will be [facility]ing [facility]. Rn gave fax number of [phone]. The sister states her brother will not allow any of the family members to be present at the appts. He history of seizures, and they are concern about him. The pt does not know they are calling. They want the MD and RN to review the information that is fax before they sign DMV forms.
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
## 2016-09-19 ENCOUNTER — Ambulatory Visit (INDEPENDENT_AMBULATORY_CARE_PROVIDER_SITE_OTHER): Payer: Medicare Other | Admitting: Neurology

## 2016-09-19 ENCOUNTER — Encounter: Payer: Self-pay | Admitting: Neurology

## 2016-09-19 VITALS — BP 115/76 | HR 79

## 2016-09-19 DIAGNOSIS — G40909 Epilepsy, unspecified, not intractable, without status epilepticus: Secondary | ICD-10-CM | POA: Diagnosis not present

## 2016-09-19 DIAGNOSIS — G8250 Quadriplegia, unspecified: Secondary | ICD-10-CM | POA: Diagnosis not present

## 2016-09-19 DIAGNOSIS — S069X1S Unspecified intracranial injury with loss of consciousness of 30 minutes or less, sequela: Secondary | ICD-10-CM

## 2016-09-19 DIAGNOSIS — Z9119 Patient's noncompliance with other medical treatment and regimen: Secondary | ICD-10-CM | POA: Diagnosis not present

## 2016-09-19 DIAGNOSIS — G40411 Other generalized epilepsy and epileptic syndromes, intractable, with status epilepticus: Secondary | ICD-10-CM | POA: Diagnosis not present

## 2016-09-19 DIAGNOSIS — S14109S Unspecified injury at unspecified level of cervical spinal cord, sequela: Secondary | ICD-10-CM

## 2016-09-19 DIAGNOSIS — Z91199 Patient's noncompliance with other medical treatment and regimen due to unspecified reason: Secondary | ICD-10-CM

## 2016-09-19 NOTE — Patient Instructions (Addendum)
Mr. Lysle Dingwallhurman Carollo,     Common seizure triggers are: Sleep deprivation, dehydration, overheating, stress, hypoglycemia or skipping meals, certain medications or excessive alcohol use, especially stopping alcohol abruptly if you have had heavy alcohol use before (aka alcohol withdrawal seizure). If you have a prolonged seizure over 2-5 minutes or back to back seizures, call or have someone call 911 or take you to the nearest emergency room. You cannot drive a car or operate any other machinery or vehicle within 6 months of a seizure.  Take your medicine for seizure prevention regularly and do not skip doses or stop medication abruptly. Try to get a refill on your antiepileptic medication ahead of time, so you are not at risk of running out. If you run out of the seizure medication and do not have a refill at hand she may run into medication withdrawal seizures. Avoid taking Wellbutrin, narcotic pain medications and tramadol, as they can lower seizure threshold.
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
## 2016-09-20 ENCOUNTER — Telehealth: Payer: Self-pay | Admitting: Neurology

## 2016-09-20 LAB — COMPREHENSIVE METABOLIC PANEL
ALT: 13 IU/L (ref 0–44)
AST: 17 IU/L (ref 0–40)
Albumin/Globulin Ratio: 1.1 — ABNORMAL LOW (ref 1.2–2.2)
Albumin: 3.7 g/dL (ref 3.5–5.5)
Alkaline Phosphatase: 88 IU/L (ref 39–117)
BUN/Creatinine Ratio: 22 — ABNORMAL HIGH (ref 9–20)
BUN: 17 mg/dL (ref 6–24)
Bilirubin Total: 0.6 mg/dL (ref 0.0–1.2)
CO2: 26 mmol/L (ref 18–29)
Calcium: 9.2 mg/dL (ref 8.7–10.2)
Chloride: 102 mmol/L (ref 96–106)
Creatinine, Ser: 0.77 mg/dL (ref 0.76–1.27)
GFR calc Af Amer: 128 mL/min/{1.73_m2} (ref >59)
GFR calc non Af Amer: 110 mL/min/{1.73_m2} (ref >59)
Globulin, Total: 3.3 g/dL (ref 1.5–4.5)
Glucose: 76 mg/dL (ref 65–99)
Potassium: 4.7 mmol/L (ref 3.5–5.2)
Sodium: 143 mmol/L (ref 134–144)
Total Protein: 7 g/dL (ref 6.0–8.5)

## 2016-09-20 LAB — VALPROIC ACID LEVEL: Valproic Acid Lvl: 73 ug/mL (ref 50–100)

## 2016-09-20 NOTE — Telephone Encounter (Signed)
I spoke to pt and advised him that per Dr. Vickey Hugerohmeier, pt should speak to his rehab physician regarding vocational rehab. I reiterated that pt should not be driving. Pt verbalized understanding.

## 2016-09-20 NOTE — Telephone Encounter (Signed)
Pt called says he wants to know if there is a vocational program that medicare would cover to help keep the brain active. He would like to get up and move around to keep active.

## 2016-09-20 NOTE — Telephone Encounter (Signed)
He wants to drive, and is not allowed to.  We had this discussion for 35 minutes yesterday.  EEG and labs pending. He is not taking medication as prescribed, He drives and is advised not to. In spite of being told not to drive, he called the [facility] and stated he was given information that he was  not banned from driving.  Vocational rehab NEEDS TO BE DISCUSSED WITH HIS REHAB PHYSICIAN.

## 2016-09-21 ENCOUNTER — Telehealth: Payer: Self-pay | Admitting: *Deleted

## 2016-09-21 NOTE — Telephone Encounter (Signed)
[name] called and is requesting rehab to help keep his body active to prevent pressure sores until he is able to do more on his own.  He first requested vocational rehab but I htin he is meaning physical rehab.  Please advise.

## 2016-09-21 NOTE — Telephone Encounter (Signed)
Will need to make appt

## 2016-09-25 ENCOUNTER — Telehealth: Payer: Self-pay | Admitting: Neurology

## 2016-09-25 DIAGNOSIS — G40219 Localization-related (focal) (partial) symptomatic epilepsy and epileptic syndromes with complex partial seizures, intractable, without status epilepticus: Secondary | ICD-10-CM

## 2016-09-25 MED ORDER — LEVETIRACETAM 500 MG PO TABS: 500.0000 mg | ORAL_TABLET | Freq: Two times a day (BID) | ORAL | 11 refills | Status: DC

## 2016-09-25 NOTE — Telephone Encounter (Signed)
-----   Message from Melvyn Novasarmen Dohmeier, MD sent at 09/25/2016  4:55 PM EST ----- Valproic acid level therapeutic at the day of test, normal metabolic panel. No change in medication .  Patient has frequent seizures, needs a second medication to be added. I recommend to add keppra. 500 mg bid po.   Patient is not allowed to drive . His questions about rehab need to be directed to his rehab physician.  CD

## 2016-09-25 NOTE — Telephone Encounter (Signed)
L/m for patient to call back

## 2016-09-25 NOTE — Telephone Encounter (Signed)
Patient needs to start another medication as the therapeutic level VPA did not prevent seizures.  added keppra 500 mg bid. CD

## 2016-09-26 NOTE — Telephone Encounter (Signed)
The family came by the office thinking that the new medication is here. Front desk advised her that it was sent to the pharmacy. They voiced understanding.

## 2016-09-27 ENCOUNTER — Telehealth: Payer: Self-pay | Admitting: Neurology

## 2016-09-27 NOTE — Telephone Encounter (Signed)
error 

## 2016-09-27 NOTE — Telephone Encounter (Signed)
[facility] to verify if the patient is to continue taking  divalproex (DEPAKOTE ER) 500 MG 24 hr tablet since a new Rx ws sent to them for levETIRAcetam (KEPPRA) 500 MG tablet. Please call and advise.

## 2016-09-27 NOTE — Telephone Encounter (Signed)
I called [name] back and verified his medications with him. Stated that we have faxed his prescription in. He will go by the pharmacy to p/u tomorrow.  I called his pharmacy and left message verifying that patient is taking both medications. Left call back number for further questions.

## 2016-09-27 NOTE — Telephone Encounter (Signed)
Pt called very confused as to what is going on with his meds. Please call him at [phone].

## 2016-10-01 ENCOUNTER — Telehealth: Payer: Self-pay | Admitting: Neurology

## 2016-10-01 NOTE — Telephone Encounter (Signed)
Patient  is calling to discuss going to an activity program until he is able to drive.

## 2016-10-08 ENCOUNTER — Encounter: Payer: Medicare Other | Attending: Physical Medicine & Rehabilitation

## 2016-10-08 ENCOUNTER — Ambulatory Visit (HOSPITAL_BASED_OUTPATIENT_CLINIC_OR_DEPARTMENT_OTHER): Payer: Medicare Other | Admitting: Physical Medicine & Rehabilitation

## 2016-10-08 ENCOUNTER — Encounter: Payer: Self-pay | Admitting: Physical Medicine & Rehabilitation

## 2016-10-08 VITALS — BP 112/75 | HR 84 | Resp 16

## 2016-10-08 DIAGNOSIS — K592 Neurogenic bowel, not elsewhere classified: Secondary | ICD-10-CM

## 2016-10-08 DIAGNOSIS — Z89511 Acquired absence of right leg below knee: Secondary | ICD-10-CM

## 2016-10-08 DIAGNOSIS — N319 Neuromuscular dysfunction of bladder, unspecified: Secondary | ICD-10-CM

## 2016-10-08 DIAGNOSIS — S14157S Other incomplete lesion at C7 level of cervical spinal cord, sequela: Secondary | ICD-10-CM

## 2016-10-08 DIAGNOSIS — Z8249 Family history of ischemic heart disease and other diseases of the circulatory system: Secondary | ICD-10-CM | POA: Diagnosis not present

## 2016-10-08 DIAGNOSIS — Z89512 Acquired absence of left leg below knee: Secondary | ICD-10-CM | POA: Insufficient documentation

## 2016-10-08 DIAGNOSIS — G40909 Epilepsy, unspecified, not intractable, without status epilepticus: Secondary | ICD-10-CM | POA: Insufficient documentation

## 2016-10-08 DIAGNOSIS — G8250 Quadriplegia, unspecified: Secondary | ICD-10-CM

## 2016-10-08 DIAGNOSIS — Z9889 Other specified postprocedural states: Secondary | ICD-10-CM | POA: Insufficient documentation

## 2016-10-08 DIAGNOSIS — F17210 Nicotine dependence, cigarettes, uncomplicated: Secondary | ICD-10-CM | POA: Insufficient documentation

## 2016-10-08 DIAGNOSIS — G800 Spastic quadriplegic cerebral palsy: Secondary | ICD-10-CM | POA: Insufficient documentation

## 2016-10-08 DIAGNOSIS — N312 Flaccid neuropathic bladder, not elsewhere classified: Secondary | ICD-10-CM | POA: Diagnosis not present

## 2016-10-08 NOTE — Progress Notes (Signed)
Subjective:    Patient ID: [name], male    DOB: 1971-09-23, 45 y.o.   MRN: [id number]  HPI Bowels are ok, no laxatives needed, quick response to dig stim Sees Dr Vernie Ammonsttelin for Neurogenic bladder Discharged from Dr Marko Staiohmeiers office for seizure non compliance     Pain Inventory Average Pain 5 Pain Right Now 0 My pain is intermittent and sharp  In the last 24 hours, has pain interfered with the following? General activity 8 Relation with others 0 Enjoyment of life 0 What TIME of day is your pain at its worst? extended sitting, pressure sores Sleep (in general) Good  Pain is worse with: sitting and inactivity Pain improves with: rest and therapy/exercise Relief from Meds: 0  Mobility do you drive?  yes use a wheelchair  Function disabled: date disabled .  Neuro/Psych bowel control problems  Prior Studies Any changes since last visit?  no  Physicians involved in your care Any changes since last visit?  no   Family History  Problem Relation Age of Onset  . Hypertension Mother    Social History   Social History  . Marital status: Single    Spouse name: N/A  . Number of children: N/A  . Years of education: N/A   Occupational History  . Disable    Social History Main Topics  . Smoking status: Current Every Day Smoker    Packs/day: 0.08    Years: 21.00    Types: Cigarettes  . Smokeless tobacco: Never Used  . Alcohol use No  . Drug use: No  . Sexual activity: Not Asked   Other Topics Concern  . None   Social History Narrative  . None   Past Surgical History:  Procedure Laterality Date  . AMPUTATION Bilateral 12/20/2015   Procedure: AMPUTATION BILATERAL BELOW KNEE;  Surgeon: Myrene GalasMichael Handy, MD;  [facility];  Service: Orthopedics;  Laterality: Bilateral;  . APPLICATION OF WOUND VAC Bilateral 12/08/2015   Procedure: APPLICATION OF WOUND VAC BILATERAL LEGS ;  Surgeon: Tarry KosNaiping M Xu, MD;  Location: [facility] OR;  Service: Orthopedics;  Laterality:  Bilateral;  . APPLICATION OF WOUND VAC Bilateral 12/15/2015   Procedure: APPLICATION OF WOUND VAC;  Surgeon: Myrene GalasMichael Handy, MD;  [facility];  Service: Orthopedics;  Laterality: Bilateral;  . BLADDER SURGERY    . EXTERNAL FIXATION LEG Bilateral 12/08/2015   Procedure: EXTERNAL FIXATION BILATERAL TIBIA/FIBULA;  Surgeon: Tarry KosNaiping M Xu, MD;  Location: [facility] OR;  Service: Orthopedics;  Laterality: Bilateral;  . EXTERNAL FIXATION REMOVAL Bilateral 12/15/2015   Procedure: REMOVAL EXTERNAL FIXATION LEG;  Surgeon: Myrene GalasMichael Handy, MD;  [facility];  Service: Orthopedics;  Laterality: Bilateral;  . I&D EXTREMITY Bilateral 12/08/2015   Procedure: IRRIGATION AND DEBRIDEMENT BILATERAL TIBIA/FIBULA;  Surgeon: Tarry KosNaiping M Xu, MD;  Location: [facility] OR;  Service: Orthopedics;  Laterality: Bilateral;  . I&D EXTREMITY Left 12/11/2015   Procedure: IRRIGATION AND DEBRIDEMENT LEG;  Surgeon: Tarry KosNaiping M Xu, MD;  [facility] OR;  Service: Orthopedics;  Laterality: Left;  . INCISION AND DRAINAGE Bilateral 12/15/2015   Procedure: INCISION AND DRAINAGE BILATERAL LEG WOUNDS;  Surgeon: Myrene GalasMichael Handy, MD;  [facility];  Service: Orthopedics;  Laterality: Bilateral;  . INCISION AND DRAINAGE PERIRECTAL ABSCESS    . PERCUTANEOUS PINNING Bilateral 12/15/2015   Procedure: PERCUTANEOUS PINNING EXTREMITY  BILATERAL TIBIAL FRACTURES AND LEFT ANKLE FRACTURE FOR TEMPORARY STABILIZATION;  Surgeon: Myrene GalasMichael Handy, MD;  [facility]: [facility] OR;  Service: Orthopedics;  Laterality: Bilateral;  . SPINE SURGERY  Past Medical History:  Diagnosis Date  . C5 spinal cord injury (HCC)   . 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 Quadriplegia and quadriparesis (HCC) 08/12/2013  . Seizures (HCC)    focal  . Tobacco use 03/20/2012   BP 112/75   Pulse 84   Resp 16   SpO2 97%   Opioid Risk Score:   Fall Risk Score:  `1  Depression screen PHQ 2/9  Depression [facility] 01/02/2016  07/25/2015 01/25/2015  Decreased Interest 1 0 3  Down, Depressed, Hopeless 1 0 1  PHQ - 2 Score 2 0 4  Altered sleeping 2 - 2  Tired, decreased energy 2 - 0  Change in appetite 1 - 0  Feeling bad or failure about yourself  1 - 0  Trouble concentrating 2 - 0  Moving slowly or fidgety/restless 1 - 1  Suicidal thoughts 0 - 0  PHQ-9 Score 11 - 7  Difficult doing work/chores Somewhat difficult - -    Review of Systems  Constitutional: Negative.   HENT: Negative.   Eyes: Negative.   Respiratory: Negative.   Cardiovascular: Negative.   Gastrointestinal: Negative.        Bowel control problems  Endocrine: Negative.   Genitourinary: Negative.   Musculoskeletal: Negative.   Skin: Positive for rash.       Pressure sores  Allergic/Immunologic: Negative.   Neurological: Negative.   Hematological: Negative.   Psychiatric/Behavioral: Negative.   All other systems reviewed and are negative.      Objective:   Physical Exam  Constitutional: He is oriented to person, place, and time. He appears well-developed and well-nourished.  HENT:  Head: Normocephalic and atraumatic.  Eyes: Conjunctivae and EOM are normal. Pupils are equal, round, and reactive to light.  Neurological: He is alert and oriented to person, place, and time.  Psychiatric: He has a normal mood and affect.  Nursing note and vitals reviewed.   Neurological: He is alert and oriented to person, place, and time. He displays atrophy. A sensory deficit is present. Coordination abnormal.  Patient has 3+ finger flexors 4 minus triceps, 5 biceps Decreased truncal stability No active motion in both lower limbs. Skin peri area well healed no open areas, no erythema Well-healed BK stumps    Assessment & Plan:  1. C7 spastic tetraplegic, neurogenic bladder, as well as bowel. Continues on dig stim. Follows with urology for the bladder issues. No new rehabilitation needs. At the current time.  2. Bilateral low knee amputations,  traumatic, well-healed, not a prosthetic candidate due to his paralysis. 3. Seizure disorder. We discussed that he was discharged from his neurologist office for noncompliance, driving without permission. Advised that he establishes care with another neurologist,

## 2016-10-09 NOTE — Telephone Encounter (Signed)
Pt called says Dr Wynn BankerKirsteins advised him Dr Vickey Hugerohmeier would have to refer him for Voc Rehab.

## 2016-10-10 ENCOUNTER — Telehealth: Payer: Self-pay | Admitting: Neurology

## 2016-10-10 NOTE — Telephone Encounter (Signed)
-----   Message from Erick ColaceAndrew E Kirsteins, MD sent at 10/09/2016  4:55 PM EST ----- Porfirio Mylararmen I just saw this patient, and he did not ask me about this yesterday. I think he would be a good candidate for this, although I understand that he is not allowed to drive at this point. The vocational rehabilitation services are more limited these days. I don't think I've had anybody successfully get a job after vocational rehabilitation since around 2004 ----- Message ----- From: Melvyn Novasarmen Cyenna Rebello, MD Sent: 10/09/2016  11:50 AM To: Erick ColaceAndrew E Kirsteins, MD   Dear Greig CastillaAndrew,  Do you not refer to vocational rehab if you find it indicated ? I am not seeing a neurological need for this patient to go through rehab, he requested it and I told him to ask you.

## 2016-10-10 NOTE — Telephone Encounter (Signed)
Please tell patient his rehab doctor thinks he could be a candidate and I will not be referring.  No, he is still not allowed to drive and DOT was asked to suspend his driving privileges CD

## 2016-10-10 NOTE — Telephone Encounter (Signed)
I spoke to patient and gave him the information below.

## 2016-10-25 ENCOUNTER — Telehealth: Payer: Self-pay | Admitting: Neurology

## 2016-10-25 NOTE — Telephone Encounter (Signed)
I called pt. He is confused about his medications. I explained to him again that he should be taking depakote 500mg  tablet by mouth twice daily; 2 tablets in the morning and 3 tablets at bedtime. Pt says that he has been taking 1 tablet 4 times a day. Per Dr. Oliva Bustardohmeier's last note on 10/25/2016 with pt and the medication instructions on the depakote, " PLAN: Increase Depakote to 5 tabs daily 2 in the morning and 3 in the evening". Depakote RX instructions: "Increase to 2 every morning and 3 every hs"  Pt also says that he has [facility] the [facility]. I advised him that we have spoken with them on 09/26/2016 and pt was supposed to go by and pick his medications up.   I called Dawn at [facility] and spoke to [place]. She says that they have applied to [facility] for pt's keppra and are waiting to hear back. She has also explained this to the pt.  I called the pt and explained all of this to him again. Pt verbalized understanding of his depakote dosing instructions and knows that [facility] will call him when his keppra is ready.

## 2016-10-25 NOTE — Telephone Encounter (Signed)
Pt called said since increasing divalproex (DEPAKOTE ER) 500 MG 24 hr tablet to 4 x day he has seen "tremendous improvement" in daily activities.  Pt said he has not started taking levETIRAcetam (KEPPRA) 500 MG tablet . The pt called wanting to know what appt he had on 11/05/16, I relayed EEG. Then he told me about the medication. He is requesting RN to call

## 2016-10-29 ENCOUNTER — Telehealth: Payer: Self-pay | Admitting: Nurse Practitioner

## 2016-10-29 NOTE — Telephone Encounter (Signed)
PT STATES DOING REALLY WELL TAKING DEPAKOTE FIVE TIMES A DAY. PLEASE LET HIM KNOW IF YOU WANT HIM TO TAKE A DIFFERENT DOSAGE.

## 2016-10-29 NOTE — Telephone Encounter (Signed)
I called pt. I explained again to him that he should not be taking the depakote 5 times daily. He should be taking depakote 2 tablets in the morning and 3 tablets at bedtime. Pt confirms that this is what he is doing. Pt reports that his EEG was rescheduled to 11/20/16 at 3:30pm and he should arrive at 3:15pm. Pt verbalized understanding of depakote dosing and of EEG appt date and time.

## 2016-11-05 ENCOUNTER — Other Ambulatory Visit: Payer: Medicare Other

## 2016-11-05 ENCOUNTER — Encounter: Payer: Self-pay | Admitting: Psychology

## 2016-11-06 ENCOUNTER — Telehealth: Payer: Self-pay | Admitting: *Deleted

## 2016-11-06 NOTE — Telephone Encounter (Signed)
Mr [name] has called to ask about when he can drive again.  (also he says the increased depakote has helped tremendously).  Please advise

## 2016-11-06 NOTE — Telephone Encounter (Signed)
I will not be making the driving determination, this is up to neurology.

## 2016-11-09 NOTE — Telephone Encounter (Signed)
Information given to Mr [name].

## 2016-11-13 ENCOUNTER — Ambulatory Visit: Payer: Medicare Other | Admitting: Nurse Practitioner

## 2016-11-15 ENCOUNTER — Encounter: Payer: Self-pay | Admitting: Psychology

## 2016-11-19 ENCOUNTER — Telehealth: Payer: Self-pay | Admitting: Neurology

## 2016-11-19 DIAGNOSIS — R310 Gross hematuria: Secondary | ICD-10-CM | POA: Diagnosis not present

## 2016-11-19 DIAGNOSIS — N3001 Acute cystitis with hematuria: Secondary | ICD-10-CM | POA: Diagnosis not present

## 2016-11-19 NOTE — Telephone Encounter (Signed)
Patient requesting call back from nurse regarding blood in urine. Best call back is [phone]

## 2016-11-19 NOTE — Telephone Encounter (Signed)
I called pt. He reports to me that he woke up this morning and noticed blood in his urine. He has made an appt with his urologist for today at 1:00pm. Pt says that he still plans to come to his EEG tomorrow. I advised him to please discuss with his urologist the blood he noticed in his urine. Pt verbalized understanding.

## 2016-11-20 ENCOUNTER — Telehealth: Payer: Self-pay | Admitting: Neurology

## 2016-11-20 ENCOUNTER — Other Ambulatory Visit: Payer: Self-pay | Admitting: Neurology

## 2016-11-20 ENCOUNTER — Ambulatory Visit (INDEPENDENT_AMBULATORY_CARE_PROVIDER_SITE_OTHER): Payer: Medicare Other | Admitting: Neurology

## 2016-11-20 DIAGNOSIS — Z9119 Patient's noncompliance with other medical treatment and regimen: Secondary | ICD-10-CM

## 2016-11-20 DIAGNOSIS — S069X1S Unspecified intracranial injury with loss of consciousness of 30 minutes or less, sequela: Secondary | ICD-10-CM

## 2016-11-20 DIAGNOSIS — G40411 Other generalized epilepsy and epileptic syndromes, intractable, with status epilepticus: Secondary | ICD-10-CM

## 2016-11-20 DIAGNOSIS — S14109S Unspecified injury at unspecified level of cervical spinal cord, sequela: Secondary | ICD-10-CM

## 2016-11-20 DIAGNOSIS — G40909 Epilepsy, unspecified, not intractable, without status epilepticus: Secondary | ICD-10-CM | POA: Diagnosis not present

## 2016-11-20 DIAGNOSIS — Z91199 Patient's noncompliance with other medical treatment and regimen due to unspecified reason: Secondary | ICD-10-CM

## 2016-11-20 DIAGNOSIS — G8250 Quadriplegia, unspecified: Secondary | ICD-10-CM

## 2016-11-20 NOTE — Telephone Encounter (Signed)
I spoke to Dr. Vickey Hugerohmeier. Pt has already had his lab work drawn today and I do not need to call the pt at this time.

## 2016-11-22 LAB — VALPROIC ACID LEVEL: Valproic Acid Lvl: 94 ug/mL (ref 50–100)

## 2016-11-22 LAB — LEVETIRACETAM LEVEL: Levetiracetam Lvl: NOT DETECTED ug/mL (ref 10.0–40.0)

## 2016-11-23 NOTE — Procedures (Signed)
   [facility]  EEG (ELECTROENCEPHALOGRAM) REPORT   STUDY DATE: 11/20/16 PATIENT NAME: [name] DOB: [birth year] MRN: [id number]  ORDERING CLINICIAN: Melvyn Novasarmen Dohmeier, MD   TECHNOLOGIST: Gearldine ShownLorraine Jones  TECHNIQUE: Electroencephalogram was recorded utilizing standard 10-20 system of lead placement and reformatted into average and bipolar montages.  RECORDING TIME: 30 minutes ACTIVATION: hyperventilation and photic stimulation  CLINICAL INFORMATION: 46 year old male with seizure disorder.  FINDINGS: Intermixed muscle artifact noted. Background rhythms of 9-10 hertz and 30-40 microvolts. No focal, lateralizing, epileptiform activity or seizures are seen. Patient recorded in the awake and drowsy state. EKG channel shows regular rhythm of 80 beats per minute.  IMPRESSION:  Normal EEG in the awake and drowsy states.    INTERPRETING PHYSICIAN:  Suanne MarkerVIKRAM R. Alaa Eyerman, MD Certified in Neurology, Neurophysiology and Neuroimaging  [facility] [address], [address] [place], [place] [address] [phone]

## 2016-11-27 ENCOUNTER — Telehealth: Payer: Self-pay

## 2016-11-27 NOTE — Telephone Encounter (Signed)
I spoke to patient and he is aware of results and recommendations.  
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
## 2016-11-27 NOTE — Telephone Encounter (Signed)
-----   Message from Geronimo RunningKristen Dinkins, RN sent at 11/26/2016  1:12 PM EST -----   ----- Message ----- From: Melvyn Novasarmen Dohmeier, MD Sent: 11/22/2016   5:33 PM To: Geronimo RunningKristen Dinkins, RN  Therapeutic VPA level.  Patient is still not allowed to drive.

## 2016-12-11 ENCOUNTER — Telehealth: Payer: Self-pay | Admitting: Neurology

## 2016-12-11 DIAGNOSIS — G40909 Epilepsy, unspecified, not intractable, without status epilepticus: Secondary | ICD-10-CM

## 2016-12-11 MED ORDER — DIVALPROEX SODIUM ER 500 MG PO TB24: ORAL_TABLET | ORAL | 0 refills | Status: DC

## 2016-12-11 NOTE — Telephone Encounter (Signed)
Patient is calling to get patient assistance with medication divalproex (DEPAKOTE ER) 500 MG 24 hr tablet.

## 2016-12-11 NOTE — Telephone Encounter (Signed)
10 day supply sent in to local pharmacy

## 2016-12-11 NOTE — Addendum Note (Signed)
Addended by: Crisoforo OxfordURNER, DIANA S on: 12/11/2016 11:54 AM   Modules accepted: Orders

## 2016-12-11 NOTE — Telephone Encounter (Signed)
Patient called office in reference to requesting a refill for divalproex (DEPAKOTE ER) 500 MG 24 hr tablet.  Patient states patient assistance does not have his medication ready and if wanting to know if we are able to fill 10 tablets for him to [facility] on [address].  Please call

## 2016-12-12 ENCOUNTER — Telehealth: Payer: Self-pay

## 2016-12-12 NOTE — Telephone Encounter (Signed)
Called and spoke to Patient about patient assistance Patient relayed to me he did not need help at this time with Depakote Patient assistance. I relayed to patient to please call if he needed assistance.

## 2016-12-12 NOTE — Telephone Encounter (Signed)
Called, was asking if we had all information/documation that was needed.

## 2016-12-13 NOTE — Telephone Encounter (Signed)
Called patient back to see which information he is in reference to, left voicemail to call us back

## 2016-12-17 ENCOUNTER — Ambulatory Visit (INDEPENDENT_AMBULATORY_CARE_PROVIDER_SITE_OTHER): Payer: Medicare Other | Admitting: Psychology

## 2016-12-17 ENCOUNTER — Ambulatory Visit: Payer: Self-pay | Admitting: Physical Medicine & Rehabilitation

## 2016-12-17 ENCOUNTER — Encounter: Payer: Self-pay | Admitting: Psychology

## 2016-12-17 DIAGNOSIS — S069X1S Unspecified intracranial injury with loss of consciousness of 30 minutes or less, sequela: Secondary | ICD-10-CM

## 2016-12-17 DIAGNOSIS — S069XAS Unspecified intracranial injury with loss of consciousness status unknown, sequela: Secondary | ICD-10-CM

## 2016-12-17 DIAGNOSIS — F0280 Dementia in other diseases classified elsewhere without behavioral disturbance: Secondary | ICD-10-CM

## 2016-12-17 DIAGNOSIS — G40909 Epilepsy, unspecified, not intractable, without status epilepticus: Secondary | ICD-10-CM
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
## 2016-12-27 ENCOUNTER — Ambulatory Visit (INDEPENDENT_AMBULATORY_CARE_PROVIDER_SITE_OTHER): Payer: Medicare Other | Admitting: Psychology

## 2016-12-27 DIAGNOSIS — S069X1S Unspecified intracranial injury with loss of consciousness of 30 minutes or less, sequela: Secondary | ICD-10-CM

## 2016-12-27 NOTE — Progress Notes (Signed)
   Neuropsychology Note  Russell Forgeherman Wenig returned today for 2 hours of neuropsychological testing with technician, Wallace Kellerana Chamberlain, BS, under the supervision of Dr. Elvis CoilMaryBeth Bailar. The patient did not appear overtly distressed by the testing session, per behavioral observation or via self-report to the technician. Rest breaks were offered. [name] will return within 2 weeks for a feedback session with Dr. Alinda DoomsBailar at which time his test performances, clinical impressions and treatment recommendations will be reviewed in detail. The patient understands he can contact our office should he require our assistance before this time.  Full report to follow.

## 2017-01-01 ENCOUNTER — Telehealth: Payer: Self-pay

## 2017-01-01 NOTE — Telephone Encounter (Signed)
Dr. Vickey Hugerohmeier completed and signed pt's DMV form. Sent to MR to be processed.
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
## 2017-01-03 ENCOUNTER — Encounter: Payer: Self-pay | Admitting: Psychology

## 2017-01-03 ENCOUNTER — Ambulatory Visit (INDEPENDENT_AMBULATORY_CARE_PROVIDER_SITE_OTHER): Payer: Medicare Other | Admitting: Psychology

## 2017-01-03 DIAGNOSIS — F0280 Dementia in other diseases classified elsewhere without behavioral disturbance: Secondary | ICD-10-CM | POA: Diagnosis not present

## 2017-01-03 DIAGNOSIS — IMO0001 Reserved for inherently not codable concepts without codable children: Secondary | ICD-10-CM

## 2017-01-03 DIAGNOSIS — S069X9S Unspecified intracranial injury with loss of consciousness of unspecified duration, sequela: Secondary | ICD-10-CM | POA: Diagnosis not present

## 2017-01-03 NOTE — Patient Instructions (Signed)
1. Based on the level of cognitive dysfunction demonstrated on formal testing, it is likely that the patient requires assistance with all complex ADLs. I recommend that someone else manage his medications and dispense them to him to ensure they are taken correctly. This is particularly important given his seizure disorder. I understand his mother is assisting to some extent with management of finances; she may consider overseeing his spending to insure there are no errors and that appropriate judgment is used. His mother or someone else should also monitor his appointments, and he should have someone with him at all medical appointments if possible.  2. It is my opinion that it is not safe for Mr. [name] to continue driving, due to the level of cognitive dysfunction demonstrated on formal neuropsychological evaluation, and due to history of poor judgment and reduced awareness.  3. It may be prudent for Mr. [name] to consider looking into alternative residential options such as a group home where he has more structure, oversight and opportunities for socialization.  4. He should look into options for transportation programs that can assist him in getting to activities he enjoys including volunteer activities. The [employer] may have more information on this. 5. Mr. [name] will need to continue to utilize compensatory strategies, to the extent possible, to assist with memory and other cognitive functions. For example, keeping a written or voice recorded log and reviewing it regularly is recommended. Alarm reminders for medications is necessary if he does not have someone to administer and dispense medications to him.  6. If there is a concern of a superimposed neurodegenerative disorder, retesting in the future will assist in determining interval decline.

## 2017-01-10 ENCOUNTER — Telehealth: Payer: Self-pay | Admitting: Neurology

## 2017-01-10 NOTE — Telephone Encounter (Signed)
Pt called late yesterday requesting to schedule an appt. I thought he had a balance to pay and told him I would call him back today. Gaynelle clarified today that he does not have a balance.  Also OV 11/29 provider said she was going to refer him to another neurologist, appt with NP was c/a. Please call to discuss scheduling appt

## 2017-01-10 NOTE — Telephone Encounter (Signed)
I will discuss with Dr. Vickey Hugerohmeier.

## 2017-01-14 NOTE — Telephone Encounter (Signed)
I spoke to Dr. Vickey Hugerohmeier. Since she did not formally dismiss pt by sending documentation to [facility], and ordered procedures and medication refills, he is still our patient. She is agreeable to him having a follow up appt.  I called pt. He wants to know the status of his DMV form. I advised him that it was filled out and sent to [facility] to be processed [facility] Pt says that Dr. Wynn BankerKirsteins told him that he could drive, but he needs an appt with us before he can drive officially. An appt was made for pt on 05/01/2017 at 8:30am and pt was reminded to NOT DRIVE until after this appt and clearance from Dr. Vickey Hugerohmeier. Pt verbalized understanding.

## 2017-01-15 ENCOUNTER — Telehealth: Payer: Self-pay | Admitting: Neurology

## 2017-01-15 NOTE — Telephone Encounter (Signed)
No, I do not. This is a social work question.

## 2017-01-15 NOTE — Telephone Encounter (Signed)
Patient is calling to find out if Dr. Vickey Hugerohmeier knows of any volunteer programs he can participate in.

## 2017-01-16 NOTE — Telephone Encounter (Signed)
I called pt, advised him that Dr. Vickey Hugerohmeier does not know of any volunteer programs that pt can participate in. Pt verbalized understanding.

## 2017-01-30 NOTE — Telephone Encounter (Signed)
Pt has called because he has questions re: the letter that he has received from the [facility] and the  Appointment that is set for 7-11

## 2017-02-11 ENCOUNTER — Other Ambulatory Visit: Payer: Self-pay

## 2017-02-11 DIAGNOSIS — G40909 Epilepsy, unspecified, not intractable, without status epilepticus: Secondary | ICD-10-CM

## 2017-02-11 MED ORDER — DIVALPROEX SODIUM ER 500 MG PO TB24: ORAL_TABLET | ORAL | 1 refills | Status: DC

## 2017-02-11 MED ORDER — DIVALPROEX SODIUM ER 500 MG PO TB24: ORAL_TABLET | ORAL | 0 refills | Status: DC

## 2017-02-11 NOTE — Telephone Encounter (Signed)
Dr. Vickey Huger patient. Refill request, are you willing to refill patient's Depakote? Rx will not escribe, the [facility] depatment does not accept e-scribe.

## 2017-02-11 NOTE — Telephone Encounter (Signed)
Not clear if Dr. Vickey Huger will continue to prescribe Rx for VPA. Last office note mentioned a 3 month supply and that he will need to seek follow-up with another neurologist.  I prescribed 30 d Rx.  [name], please clarify.

## 2017-02-21 ENCOUNTER — Telehealth: Payer: Self-pay | Admitting: Neurology

## 2017-02-21 NOTE — Telephone Encounter (Signed)
-----   Message from Erick ColaceAndrew E Kirsteins, MD sent at 02/20/2017 11:39 AM EDT ----- Just for the record I never told this pt to drive since his seizure.  He has not been very honest with his reports of seizure since he very much wants to return to driving ----- Message ----- From: Melvyn Novasarmen Tripton Ned, MD Sent: 01/15/2017   1:14 PM To: Erick ColaceAndrew E Kirsteins, MD  Dr Doroteo BradfordKirstein, any comments for me regarding [name] driving ?. CD

## 2017-02-21 NOTE — Telephone Encounter (Signed)
He has an appt with you on 05/01/2017. Your next available appt is not until August. Will the July appt suffice?

## 2017-02-21 NOTE — Telephone Encounter (Signed)
YES

## 2017-02-21 NOTE — Telephone Encounter (Signed)
I need to meet with Mr [name]. Kizzie BaneHughes to discuss his transfer of care and explain my reasoning.

## 2017-03-11 ENCOUNTER — Other Ambulatory Visit: Payer: Self-pay

## 2017-03-11 DIAGNOSIS — G40909 Epilepsy, unspecified, not intractable, without status epilepticus: Secondary | ICD-10-CM

## 2017-03-11 MED ORDER — DIVALPROEX SODIUM ER 500 MG PO TB24: ORAL_TABLET | ORAL | 0 refills | Status: DC

## 2017-04-04 ENCOUNTER — Telehealth: Payer: Self-pay

## 2017-04-04 NOTE — Telephone Encounter (Signed)
Received [facility] a [facility] because pt is on his last day today. This RX was last refilled on 03/11/2017 for a 30 day supply. Pt should not need a refill until next week.  I left a message for the pharmacist to call me back.

## 2017-04-04 NOTE — Telephone Encounter (Signed)
Dawn, pharmacist called me back. She reports that pt's mother came in yesterday to the pharmacy asking for a refill on pt's depakote because he would be out today. Last RX picked up on 03/14/2017 for a 30 day supply.  Dawn says she called pt, and pt says he doesn't know what his mother is talking about, he has enough depakote to last him through next week and does not need a refill until then.  I asked Dawn to send us a refill request next week for the depakote. Dawn verbalized understanding.

## 2017-04-11 ENCOUNTER — Other Ambulatory Visit: Payer: Self-pay

## 2017-04-11 DIAGNOSIS — G40909 Epilepsy, unspecified, not intractable, without status epilepticus: Secondary | ICD-10-CM

## 2017-04-11 MED ORDER — DIVALPROEX SODIUM ER 500 MG PO TB24: ORAL_TABLET | ORAL | 0 refills | Status: DC

## 2017-04-11 NOTE — Telephone Encounter (Signed)
First refill for depakote was put under Dr. Oliva Bustardohmeier's name, who is out of the office, but printed on RX paper and needs a signature.  Reprinted in Dr. Bonnita HollowSater's name, so he may sign paper RX, but it printed on plain paper.  Reprinted again, on RX paper, gave to Dr. Epimenio FootSater for signature.

## 2017-04-11 NOTE — Telephone Encounter (Signed)
RX for depakote signed by Dr. Epimenio FootSater and [facility]. Received a receipt of confirmation.

## 2017-04-11 NOTE — Addendum Note (Signed)
Addended by: Geronimo RunningINKINS, Zoye Chandra A on: 04/11/2017 10:02 AM   Modules accepted: Orders

## 2017-04-19 ENCOUNTER — Encounter: Payer: Medicare Other | Attending: Physical Medicine & Rehabilitation

## 2017-04-19 ENCOUNTER — Encounter: Payer: Self-pay | Admitting: Physical Medicine & Rehabilitation

## 2017-04-19 ENCOUNTER — Ambulatory Visit (HOSPITAL_BASED_OUTPATIENT_CLINIC_OR_DEPARTMENT_OTHER): Payer: Medicare Other | Admitting: Physical Medicine & Rehabilitation

## 2017-04-19 VITALS — BP 95/63 | HR 78

## 2017-04-19 DIAGNOSIS — G8250 Quadriplegia, unspecified: Secondary | ICD-10-CM | POA: Insufficient documentation

## 2017-04-19 DIAGNOSIS — Z89511 Acquired absence of right leg below knee: Secondary | ICD-10-CM

## 2017-04-19 DIAGNOSIS — S14157S Other incomplete lesion at C7 level of cervical spinal cord, sequela: Secondary | ICD-10-CM | POA: Diagnosis not present

## 2017-04-19 DIAGNOSIS — K592 Neurogenic bowel, not elsewhere classified: Secondary | ICD-10-CM

## 2017-04-19 DIAGNOSIS — N319 Neuromuscular dysfunction of bladder, unspecified: Secondary | ICD-10-CM | POA: Insufficient documentation

## 2017-04-19 DIAGNOSIS — F17210 Nicotine dependence, cigarettes, uncomplicated: Secondary | ICD-10-CM | POA: Diagnosis not present

## 2017-04-19 DIAGNOSIS — Z993 Dependence on wheelchair: Secondary | ICD-10-CM | POA: Diagnosis not present

## 2017-04-19 DIAGNOSIS — S14105S Unspecified injury at C5 level of cervical spinal cord, sequela: Secondary | ICD-10-CM | POA: Insufficient documentation

## 2017-04-19 DIAGNOSIS — G40909 Epilepsy, unspecified, not intractable, without status epilepticus: Secondary | ICD-10-CM | POA: Diagnosis not present

## 2017-04-19 DIAGNOSIS — Z89512 Acquired absence of left leg below knee: Secondary | ICD-10-CM | POA: Diagnosis not present

## 2017-04-19 NOTE — Progress Notes (Signed)
Subjective:    Patient ID: [name], male    DOB: [birth year], 46 y.o.   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  In the last 24 hours, has pain interfered with the following? General activity 7 Relation with others 10 Enjoyment of life 0 What TIME of day is your pain at its worst? . Sleep (in general) .  Pain is worse with: . Pain improves with: . Relief from Meds: 0  Mobility use a wheelchair  Function not employed: date last employed .  Neuro/Psych No problems in this area  Prior Studies Any changes since last visit?  no  Physicians involved in your care Any changes since last visit?  no   Family History  Problem Relation Age of Onset  . Hypertension Mother    Social History   Social History  . Marital status: Single    Spouse name: N/A  . Number of children: N/A  . Years of education: N/A   Occupational History  . Disable    Social History Main Topics  . Smoking status: Current Every Day Smoker    Packs/day: 0.08    Years: 21.00    Types: Cigarettes  . Smokeless tobacco: Never Used  . Alcohol use No  . Drug use: No  . Sexual activity: Not on file   Other Topics Concern  . Not on file   Social History Narrative  . No narrative on file   Past Surgical History:  Procedure Laterality Date  . AMPUTATION Bilateral 12/20/2015   Procedure: AMPUTATION BILATERAL BELOW KNEE;  Surgeon: Myrene GalasMichael Handy, MD;  [facility];  Service: Orthopedics;  Laterality: Bilateral;  . APPLICATION OF WOUND VAC Bilateral 12/08/2015   Procedure: APPLICATION OF WOUND VAC BILATERAL LEGS ;  Surgeon: Tarry KosNaiping M Xu, MD;  [facility];  Service: Orthopedics;  Laterality: Bilateral;  .  APPLICATION OF WOUND VAC Bilateral 12/15/2015   Procedure: APPLICATION OF WOUND VAC;  Surgeon: Myrene GalasMichael Handy, MD;  [facility];  Service: Orthopedics;  Laterality: Bilateral;  . BLADDER SURGERY    . EXTERNAL FIXATION LEG Bilateral 12/08/2015   Procedure: EXTERNAL FIXATION BILATERAL TIBIA/FIBULA;  Surgeon: Tarry KosNaiping M Xu, MD;  [facility] OR;  Service: Orthopedics;  Laterality: Bilateral;  . EXTERNAL FIXATION REMOVAL Bilateral 12/15/2015   Procedure: REMOVAL EXTERNAL FIXATION LEG;  Surgeon: Myrene GalasMichael Handy, MD;  [facility];  Service: Orthopedics;  Laterality: Bilateral;  . I&D EXTREMITY Bilateral 12/08/2015   Procedure: IRRIGATION AND DEBRIDEMENT BILATERAL TIBIA/FIBULA;  Surgeon: Tarry KosNaiping M Xu, MD;  [facility];  Service: Orthopedics;  Laterality: Bilateral;  . I&D EXTREMITY Left 12/11/2015   Procedure: IRRIGATION AND DEBRIDEMENT LEG;  Surgeon: Tarry KosNaiping M Xu, MD;  [facility];  Service: Orthopedics;  Laterality: Left;  . INCISION AND DRAINAGE Bilateral 12/15/2015   Procedure: INCISION AND DRAINAGE BILATERAL LEG WOUNDS;  Surgeon: Myrene GalasMichael Handy, MD;  [facility];  Service: Orthopedics;  Laterality: Bilateral;  . INCISION AND DRAINAGE PERIRECTAL ABSCESS    . PERCUTANEOUS PINNING Bilateral 12/15/2015   Procedure: PERCUTANEOUS PINNING EXTREMITY  BILATERAL TIBIAL FRACTURES AND LEFT ANKLE FRACTURE FOR TEMPORARY STABILIZATION;  Surgeon: Myrene GalasMichael Handy, MD;  [facility];  Service: Orthopedics;  Laterality: Bilateral;  . SPINE SURGERY     Past Medical History:  Diagnosis Date  . C5 spinal cord injury (HCC)   . 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 Quadriplegia and quadriparesis (HCC) 08/12/2013  . Seizures (HCC)    focal  . Tobacco use 03/20/2012   There were no vitals taken for this visit.  Opioid Risk Score:   Fall Risk Score:  `1  Depression screen PHQ 2/9  Depression screen [facility] 2/9 01/02/2016 07/25/2015 01/25/2015    Decreased Interest 1 0 3  Down, Depressed, Hopeless 1 0 1  PHQ - 2 Score 2 0 4  Altered sleeping 2 - 2  Tired, decreased energy 2 - 0  Change in appetite 1 - 0  Feeling bad or failure about yourself  1 - 0  Trouble concentrating 2 - 0  Moving slowly or fidgety/restless 1 - 1  Suicidal thoughts 0 - 0  PHQ-9 Score 11 - 7  Difficult doing work/chores Somewhat difficult - -     Review of Systems  Constitutional: Negative.   HENT: Negative.   Eyes: Negative.   Respiratory: Negative.   Cardiovascular: Negative.   Gastrointestinal: Negative.   Endocrine: Negative.   Genitourinary: Negative.   Musculoskeletal: Negative.   Skin: Negative.   Allergic/Immunologic: Negative.   Neurological: Negative.   Hematological: Negative.   Psychiatric/Behavioral: Negative.   All other systems reviewed and are negative.      Objective:   Physical Exam  Constitutional: He is oriented to person, place, and time. He appears well-developed and well-nourished.  HENT:  Head: Normocephalic and atraumatic.  Eyes: Conjunctivae and EOM are normal. Pupils are equal, round, and reactive to light.  Neurological: He is alert and oriented to person, place, and time.  Psychiatric: He has a normal mood and affect.  Nursing note and vitals reviewed.  Motor strength is 4/5 bilateral deltoid, biceps, trace triceps 2 minus, finger flexors 0 bilateral hip flexors, knee extensors Muscular, skeletal, status post bilateral BKA  Tone patient goes into sustaining clonus. When testing left patellar reflex, 3 beat clonus on the right side No skin breakdown over the below-knee stumps      Assessment & Plan:  1. History of C7 tetraplegia with neurogenic bowel and bladder. Modified independent at a wheelchair level, uses manual chair. Able to transfer independently. No equipment needs. Currently, does not need any type of physical or occupational therapy at current time. 2. Neurogenic bladder. Follow up with  urology. 3. Neurogenic bowel. Continue dig stim program 4. Seizure disorder on Keppra and Depakote, follow-up with neurology. I repeated to him that I am not recommending driving and that these decisions will have to be made by neurology. We discussed that there is a state law that forbids driving after seizures for at least 6 months and that he needs neurologic clearance.

## 2017-04-19 NOTE — Patient Instructions (Signed)
Do not drive unless you get the ok from your Neurologist Dr Dohmeier [place] has a Social workerlaw against driving if you have had seizures within 34month

## 2017-04-23 ENCOUNTER — Telehealth: Payer: Self-pay | Admitting: Neurology

## 2017-04-23 NOTE — Telephone Encounter (Signed)
Due to a meeting the doctor has to attend. Called pt to make aware that we would need to reshedule appointment. We rescheduled him for the next day (12th) at 10:00 am. Pt verbalized understanding.

## 2017-05-01 ENCOUNTER — Ambulatory Visit: Payer: Self-pay | Admitting: Neurology

## 2017-05-02 ENCOUNTER — Ambulatory Visit: Payer: Self-pay | Admitting: Neurology

## 2017-05-10 ENCOUNTER — Telehealth: Payer: Self-pay | Admitting: Neurology

## 2017-05-10 NOTE — Telephone Encounter (Signed)
Will discuss at appointment.

## 2017-05-10 NOTE — Telephone Encounter (Signed)
Pt calling to inform that that Dr Kerscien(Dr who checks his movement) stated he is doing well, pt was told by Dr Jillene BucksKerscien that it will be Dr Vickey Hugerohmeier that will determine if he is able to begin driving again.  Pt would like to know if that will be determined on upcoming appointment on 05-22-2017, please call

## 2017-05-22 ENCOUNTER — Encounter: Payer: Self-pay | Admitting: Neurology

## 2017-05-22 ENCOUNTER — Ambulatory Visit (INDEPENDENT_AMBULATORY_CARE_PROVIDER_SITE_OTHER): Payer: Medicare Other | Admitting: Neurology

## 2017-05-22 DIAGNOSIS — G40909 Epilepsy, unspecified, not intractable, without status epilepticus: Secondary | ICD-10-CM | POA: Diagnosis not present

## 2017-05-22 MED ORDER — DIVALPROEX SODIUM ER 500 MG PO TB24: ORAL_TABLET | ORAL | 3 refills | Status: DC
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
## 2017-05-23 ENCOUNTER — Telehealth: Payer: Self-pay | Admitting: *Deleted

## 2017-05-23 NOTE — Telephone Encounter (Signed)
Patient is asking us to provide a list of volunteer opportunities so that he may be able to go out and help in the community.  I told him that we do not do that specifically here at our clinic and that volunteer opportunities around [place] can be found on the internet.  He explained that he does not have web access at home.  I advised him to go to the [employer].

## 2017-05-27 ENCOUNTER — Telehealth: Payer: Self-pay | Admitting: Neurology

## 2017-05-27 NOTE — Telephone Encounter (Signed)
Patient calling regarding patient assistance for Depakote.

## 2017-05-27 NOTE — Telephone Encounter (Signed)
Russell LyonsCasey here is telephone number for you . [phone].   Russell LyonsCasey I explained to patient that her has been referred out to Dr. Karel JarvisAquino which he has apt in October. Patient is asking for another seizure Medication.

## 2017-05-27 NOTE — Telephone Encounter (Signed)
Dawn with [facility] calling regarding divalproex (DEPAKOTE ER) 500 MG 24 hr tablet. She states patient says he was not given Rx .

## 2017-07-18 ENCOUNTER — Telehealth: Payer: Self-pay

## 2017-07-18 NOTE — Telephone Encounter (Signed)
[facility] was calling about [name] catheters.  I have given them his urologist name: Dr Vernie Ammons.  They need to address with urology office.

## 2017-07-18 NOTE — Telephone Encounter (Signed)
Marie from [facility] requested a call back, no other information left.

## 2017-07-22 ENCOUNTER — Ambulatory Visit: Payer: Self-pay | Admitting: Neurology

## 2017-07-23 ENCOUNTER — Other Ambulatory Visit: Payer: Self-pay | Admitting: Neurology

## 2017-07-23 DIAGNOSIS — G40909 Epilepsy, unspecified, not intractable, without status epilepticus: Secondary | ICD-10-CM

## 2017-07-23 MED ORDER — DIVALPROEX SODIUM ER 500 MG PO TB24: ORAL_TABLET | ORAL | 3 refills | Status: DC

## 2017-08-14 ENCOUNTER — Telehealth: Payer: Self-pay

## 2017-08-14 NOTE — Telephone Encounter (Signed)
Patient called wanting information on what was happening to his urology supplies, IE condom cath's and in and out cath's.  Informed the patient that all his urinary supplies are supposed to be handled by his urology doctors office, informed the patient that he needs to get in contact with that office to figure what is going on with his supplies

## 2017-08-15 ENCOUNTER — Telehealth: Payer: Self-pay | Admitting: *Deleted

## 2017-08-15 NOTE — Telephone Encounter (Signed)
Bruce has handled this already.

## 2017-10-07 ENCOUNTER — Encounter (HOSPITAL_COMMUNITY): Payer: Self-pay | Admitting: Emergency Medicine

## 2017-10-07 ENCOUNTER — Emergency Department (HOSPITAL_COMMUNITY)
Admission: EM | Admit: 2017-10-07 | Discharge: 2017-10-07 | Disposition: A | Discharge status: Home / Self Care | Payer: Medicare Other | Attending: Emergency Medicine | Admitting: Emergency Medicine

## 2017-10-07 DIAGNOSIS — G8253 Quadriplegia, C5-C7 complete: Secondary | ICD-10-CM | POA: Diagnosis not present

## 2017-10-07 DIAGNOSIS — Y999 Unspecified external cause status: Secondary | ICD-10-CM | POA: Diagnosis not present

## 2017-10-07 DIAGNOSIS — Y939 Activity, unspecified: Secondary | ICD-10-CM | POA: Insufficient documentation

## 2017-10-07 DIAGNOSIS — Y929 Unspecified place or not applicable: Secondary | ICD-10-CM | POA: Diagnosis not present

## 2017-10-07 DIAGNOSIS — R51 Headache: Secondary | ICD-10-CM | POA: Diagnosis not present

## 2017-10-07 DIAGNOSIS — S0181XA Laceration without foreign body of other part of head, initial encounter: Secondary | ICD-10-CM

## 2017-10-07 DIAGNOSIS — F17210 Nicotine dependence, cigarettes, uncomplicated: Secondary | ICD-10-CM | POA: Diagnosis not present

## 2017-10-07 DIAGNOSIS — W19XXXA Unspecified fall, initial encounter: Secondary | ICD-10-CM

## 2017-10-07 DIAGNOSIS — W050XXA Fall from non-moving wheelchair, initial encounter: Secondary | ICD-10-CM | POA: Insufficient documentation

## 2017-10-07 DIAGNOSIS — S0191XA Laceration without foreign body of unspecified part of head, initial encounter: Secondary | ICD-10-CM | POA: Diagnosis not present

## 2017-10-07 DIAGNOSIS — S0990XA Unspecified injury of head, initial encounter: Secondary | ICD-10-CM | POA: Diagnosis not present

## 2017-10-07 NOTE — ED Notes (Signed)
Bed: WLPT4 Expected date:  Expected time:  Means of arrival:  Comments: 
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
## 2017-10-07 NOTE — Discharge Instructions (Signed)
Keep Steri-Strips on until they fall off.  Glue will eventually peel off for with time.

## 2017-10-07 NOTE — ED Triage Notes (Signed)
Patient brought in by [facility] from home for falling out of wheelchair and hitting head. Patient has about inch laceration of head and small abrasion to froearm. Denies any LOC or pain. Patient has bilat BKAs and reports that he falls out of wheelchair a lot. Vitals: 134/88, 91Hr, 16R.

## 2017-10-08 DIAGNOSIS — S098XXA Other specified injuries of head, initial encounter: Secondary | ICD-10-CM | POA: Diagnosis not present

## 2017-10-08 DIAGNOSIS — S0181XA Laceration without foreign body of other part of head, initial encounter: Secondary | ICD-10-CM | POA: Diagnosis not present

## 2017-10-31 ENCOUNTER — Ambulatory Visit: Payer: Self-pay | Admitting: Neurology

## 2017-11-20 ENCOUNTER — Ambulatory Visit: Payer: Self-pay | Admitting: Neurology

## 2017-11-21 ENCOUNTER — Ambulatory Visit (INDEPENDENT_AMBULATORY_CARE_PROVIDER_SITE_OTHER): Payer: Medicare Other | Admitting: Neurology

## 2017-11-21 ENCOUNTER — Other Ambulatory Visit: Payer: Self-pay

## 2017-11-21 ENCOUNTER — Encounter: Payer: Self-pay | Admitting: Neurology

## 2017-11-21 ENCOUNTER — Other Ambulatory Visit: Payer: Medicare Other

## 2017-11-21 VITALS — BP 100/62 | HR 62 | Resp 12 | Ht 60.0 in | Wt 140.0 lb

## 2017-11-21 DIAGNOSIS — R569 Unspecified convulsions: Secondary | ICD-10-CM

## 2017-11-21 DIAGNOSIS — G40309 Generalized idiopathic epilepsy and epileptic syndromes, not intractable, without status epilepticus: Secondary | ICD-10-CM

## 2017-11-21 DIAGNOSIS — F0280 Dementia in other diseases classified elsewhere without behavioral disturbance: Secondary | ICD-10-CM

## 2017-11-21 DIAGNOSIS — G40909 Epilepsy, unspecified, not intractable, without status epilepticus: Secondary | ICD-10-CM | POA: Diagnosis not present

## 2017-11-21 DIAGNOSIS — S069X9S Unspecified intracranial injury with loss of consciousness of unspecified duration, sequela: Secondary | ICD-10-CM | POA: Diagnosis not present

## 2017-11-21 DIAGNOSIS — Z1329 Encounter for screening for other suspected endocrine disorder: Secondary | ICD-10-CM | POA: Diagnosis not present

## 2017-11-21 DIAGNOSIS — IMO0001 Reserved for inherently not codable concepts without codable children: Secondary | ICD-10-CM

## 2017-11-21 DIAGNOSIS — G40009 Localization-related (focal) (partial) idiopathic epilepsy and epileptic syndromes with seizures of localized onset, not intractable, without status epilepticus: Secondary | ICD-10-CM | POA: Diagnosis not present

## 2017-11-21 LAB — TSH: TSH: 2.54 mIU/L (ref 0.40–4.50)

## 2017-11-21 MED ORDER — DIVALPROEX SODIUM ER 500 MG PO TB24: ORAL_TABLET | ORAL | 6 refills | Status: DC
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
## 2017-11-21 NOTE — Progress Notes (Signed)
[facility]   Markee Matera, MD

## 2017-11-21 NOTE — Patient Instructions (Addendum)
1. Schedule 1-hour EEG  2. Bloodwork for CBC, CMP, Depakote level  Your provider has requested that you have labwork completed today. Please [facility] ([id number]) on the second floor of this building before leaving the office today. You do not need to check in. If you are not called within 15 minutes please check with the front desk.    3. Continue Depakote 500mg : Take 2 tabs in AM, 3 tabs in PM  4. Recommend contacting a AdministratorDriver Evaluator to do driving assessment Here are the places to contact to make an appointment:  1. [facility] [phone].  2. [facility] Clinical and behind the wheel evaluation To Schedule: [phone] Therapist: Bobbe MedicoKatherine Sobie, OT [address] [place], [place] [address]  3. [facility] Clinical and behind the wheel evaluation To Schedule: [phone] Therapist: Yves DillJill Tucker, OT [address] [place], [place] [address]  4. [employer] [place] [phone]

## 2017-11-22 LAB — VALPROIC ACID LEVEL: Valproic Acid Lvl: 117.4 mg/L — ABNORMAL HIGH (ref 50.0–100.0)

## 2017-11-22 LAB — COMPLETE METABOLIC PANEL WITH GFR
AG Ratio: 1 (calc) (ref 1.0–2.5)
ALT: 7 U/L — ABNORMAL LOW (ref 9–46)
AST: 16 U/L (ref 10–40)
Albumin: 3.2 g/dL — ABNORMAL LOW (ref 3.6–5.1)
Alkaline phosphatase (APISO): 65 U/L (ref 40–115)
BUN: 17 mg/dL (ref 7–25)
CO2: 31 mmol/L (ref 20–32)
Calcium: 8.9 mg/dL (ref 8.6–10.3)
Chloride: 105 mmol/L (ref 98–110)
Creat: 0.78 mg/dL (ref 0.60–1.35)
GFR, Est African American: 126 mL/min/{1.73_m2} (ref >=60)
GFR, Est Non African American: 109 mL/min/{1.73_m2} (ref >=60)
Globulin: 3.3 g/dL (calc) (ref 1.9–3.7)
Glucose, Bld: 75 mg/dL (ref 65–99)
Potassium: 4.2 mmol/L (ref 3.5–5.3)
Sodium: 142 mmol/L (ref 135–146)
Total Bilirubin: 0.6 mg/dL (ref 0.2–1.2)
Total Protein: 6.5 g/dL (ref 6.1–8.1)

## 2017-11-22 LAB — CBC WITH DIFFERENTIAL/PLATELET
Basophils Absolute: 19 cells/uL (ref 0–200)
Basophils Relative: 0.3 %
Eosinophils Absolute: 51 cells/uL (ref 15–500)
Eosinophils Relative: 0.8 %
HCT: 43.2 % (ref 38.5–50.0)
Hemoglobin: 14.8 g/dL (ref 13.2–17.1)
Lymphs Abs: 2618 cells/uL (ref 850–3900)
MCH: 31.6 pg (ref 27.0–33.0)
MCHC: 34.3 g/dL (ref 32.0–36.0)
MCV: 92.3 fL (ref 80.0–100.0)
MPV: 11 fL (ref 7.5–12.5)
Monocytes Relative: 10.5 %
Neutro Abs: 3040 cells/uL (ref 1500–7800)
Neutrophils Relative %: 47.5 %
Platelets: 158 10*3/uL (ref 140–400)
RBC: 4.68 10*6/uL (ref 4.20–5.80)
RDW: 13.1 % (ref 11.0–15.0)
Total Lymphocyte: 40.9 %
WBC mixed population: 672 cells/uL (ref 200–950)
WBC: 6.4 10*3/uL (ref 3.8–10.8)

## 2017-11-25 ENCOUNTER — Telehealth: Payer: Self-pay | Admitting: Neurology

## 2017-11-25 NOTE — Telephone Encounter (Signed)
Patient mother wants to talk to someone about her son doing a driving test. He is having seizures and is suppose to take a driving test please call

## 2017-11-25 NOTE — Telephone Encounter (Signed)
Patient states that we need to fax the paperwork over for the [facility] please call patient cb#[phone]

## 2017-11-26 ENCOUNTER — Telehealth: Payer: Self-pay
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
## 2017-11-26 NOTE — Telephone Encounter (Signed)
Called pt.  No answer.  LMOVM asking for return call to relay message below as well as to ask which driving evaluator he is wanting to see so I can fax papers he called about yesterday.

## 2017-11-26 NOTE — Telephone Encounter (Signed)
-----   Message from Van ClinesKaren M Aquino, MD sent at 11/26/2017 11:28 AM EST ----- Pls let him know bloodwork looked fine, liver and kidney function normal. Any word from his mother about her report of seizures? Thanks

## 2017-12-02 ENCOUNTER — Other Ambulatory Visit: Payer: Self-pay

## 2017-12-05 ENCOUNTER — Telehealth: Payer: Self-pay

## 2017-12-05 NOTE — Telephone Encounter (Signed)
-----   Message from Karen M Aquino, MD sent at 11/26/2017 11:28 AM EST ----- Pls let him know bloodwork looked fine, liver and kidney function normal. Any word from his mother about her report of seizures? Thanks 

## 2017-12-05 NOTE — Telephone Encounter (Signed)
Called pt.  No answer.  LMOVM asking for return call.

## 2017-12-12 ENCOUNTER — Telehealth: Payer: Self-pay | Admitting: Neurology

## 2017-12-12 ENCOUNTER — Other Ambulatory Visit: Payer: Self-pay | Admitting: *Deleted

## 2017-12-12 DIAGNOSIS — G40909 Epilepsy, unspecified, not intractable, without status epilepticus: Secondary | ICD-10-CM

## 2017-12-12 NOTE — Telephone Encounter (Signed)
Called and spoke with Chantel, with [facility].  I let her know that we do not think pt should be allowed to drive, but he is adamant about driving.  Explained that we received call from pt's mother letting us know that he is still having seizures, he is not compliant with his medications and that he was dismissed from [facility] due to non-compliance in regards to driving. Chantel asked if I could send LOV notes - per Dr. Karel JarvisAquino I will need a signed medical release form from pt.  Will call pt and let him know.

## 2017-12-12 NOTE — Telephone Encounter (Signed)
Spoke with pt.  Let him know [facility] [place].  Pt states that he does not have transportation and asked if we could provide transportation.  I advised that this was not a service we provide.  Pt stated that he will try to find someone who can bring him to evaluator.

## 2017-12-12 NOTE — Telephone Encounter (Signed)
Patient called and would like you to please call him. It's regarding the referral that was sent for him - driving rehabilitation services [place]. He is unable to get there next Tuesday due to transportation and family members schedule. He would like something closer. Please Call

## 2017-12-23 ENCOUNTER — Telehealth: Payer: Self-pay | Admitting: Neurology

## 2017-12-23 NOTE — Telephone Encounter (Signed)
Patient wants to talk to someone about why he can not drive please call

## 2017-12-26 NOTE — Telephone Encounter (Signed)
Pt called and wants to know about driving and also wants to find a organization to do some volunteer work, please call and advise

## 2017-12-30 ENCOUNTER — Telehealth: Payer: Self-pay | Admitting: Physical Medicine & Rehabilitation

## 2017-12-30 NOTE — Telephone Encounter (Signed)
Pt would like recommendations for volunteer programs that he could get involved in rather than sitting at home.  Please call to advise.

## 2017-12-31 NOTE — Telephone Encounter (Signed)
Contacted patient gave phone number for [email or web address] (volunteer center of [place]).  I also gave him phone numbers for [employer] transportation.  He was much obliged.

## 2018-01-02 NOTE — Telephone Encounter (Signed)
Pt is already aware of reason he cannot drive.

## 2018-01-09 ENCOUNTER — Encounter: Payer: PRIVATE HEALTH INSURANCE | Attending: Physical Medicine & Rehabilitation

## 2018-01-09 ENCOUNTER — Encounter: Payer: Self-pay | Admitting: Physical Medicine & Rehabilitation

## 2018-01-09 ENCOUNTER — Ambulatory Visit (HOSPITAL_BASED_OUTPATIENT_CLINIC_OR_DEPARTMENT_OTHER): Payer: PRIVATE HEALTH INSURANCE | Admitting: Physical Medicine & Rehabilitation

## 2018-01-09 VITALS — BP 87/60 | HR 82 | Resp 14

## 2018-01-09 DIAGNOSIS — Z993 Dependence on wheelchair: Secondary | ICD-10-CM | POA: Diagnosis not present

## 2018-01-09 DIAGNOSIS — K592 Neurogenic bowel, not elsewhere classified: Secondary | ICD-10-CM | POA: Diagnosis not present

## 2018-01-09 DIAGNOSIS — T148XXA Other injury of unspecified body region, initial encounter: Secondary | ICD-10-CM | POA: Diagnosis not present

## 2018-01-09 DIAGNOSIS — S14157S Other incomplete lesion at C7 level of cervical spinal cord, sequela: Secondary | ICD-10-CM | POA: Diagnosis present

## 2018-01-09 DIAGNOSIS — Z89512 Acquired absence of left leg below knee: Secondary | ICD-10-CM | POA: Insufficient documentation

## 2018-01-09 DIAGNOSIS — N319 Neuromuscular dysfunction of bladder, unspecified: Secondary | ICD-10-CM | POA: Insufficient documentation

## 2018-01-09 DIAGNOSIS — Z89511 Acquired absence of right leg below knee: Secondary | ICD-10-CM | POA: Diagnosis not present

## 2018-01-09 DIAGNOSIS — Z87828 Personal history of other (healed) physical injury and trauma: Secondary | ICD-10-CM | POA: Diagnosis not present

## 2018-01-09 DIAGNOSIS — R569 Unspecified convulsions: Secondary | ICD-10-CM | POA: Insufficient documentation

## 2018-01-09 DIAGNOSIS — G8250 Quadriplegia, unspecified: Secondary | ICD-10-CM

## 2018-01-09 NOTE — Patient Instructions (Signed)
Use triple antibiotic ointment to wound cover with tegaderm use every 3 days
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
## 2018-01-28 ENCOUNTER — Other Ambulatory Visit: Payer: Self-pay | Admitting: Urology

## 2018-02-03 ENCOUNTER — Ambulatory Visit (INDEPENDENT_AMBULATORY_CARE_PROVIDER_SITE_OTHER): Payer: Medicare Other | Admitting: Family Medicine

## 2018-02-03 ENCOUNTER — Encounter: Payer: Self-pay | Admitting: Family Medicine

## 2018-02-03 VITALS — BP 104/68 | HR 80 | Temp 98.8°F | Wt 145.0 lb

## 2018-02-03 DIAGNOSIS — Z7689 Persons encountering health services in other specified circumstances: Secondary | ICD-10-CM

## 2018-02-03 DIAGNOSIS — G40909 Epilepsy, unspecified, not intractable, without status epilepticus: Secondary | ICD-10-CM | POA: Diagnosis not present

## 2018-02-03 DIAGNOSIS — H6123 Impacted cerumen, bilateral: Secondary | ICD-10-CM

## 2018-02-03 DIAGNOSIS — F17210 Nicotine dependence, cigarettes, uncomplicated: Secondary | ICD-10-CM | POA: Diagnosis not present

## 2018-02-03 NOTE — Patient Instructions (Addendum)
Earwax Buildup, Adult The ears produce a substance called earwax that helps keep bacteria out of the ear and protects the skin in the ear canal. Occasionally, earwax can build up in the ear and cause discomfort or hearing loss. What increases the risk? This condition is more likely to develop in people who:  Are male.  Are elderly.  Naturally produce more earwax.  Clean their ears often with cotton swabs.  Use earplugs often.  Use in-ear headphones often.  Wear hearing aids.  Have narrow ear canals.  Have earwax that is overly thick or sticky.  Have eczema.  Are dehydrated.  Have excess hair in the ear canal.  What are the signs or symptoms? Symptoms of this condition include:  Reduced or muffled hearing.  A feeling of fullness in the ear or feeling that the ear is plugged.  Fluid coming from the ear.  Ear pain.  Ear itch.  Ringing in the ear.  Coughing.  An obvious piece of earwax that can be seen inside the ear canal.  How is this diagnosed? This condition may be diagnosed based on:  Your symptoms.  Your medical history.  An ear exam. During the exam, your health care provider will look into your ear with an instrument called an otoscope.  You may have tests, including a hearing test. How is this treated? This condition may be treated by:  Using ear drops to soften the earwax.  Having the earwax removed by a health care provider. The health care provider may: ? Flush the ear with water. ? Use an instrument that has a loop on the end (curette). ? Use a suction device.  Surgery to remove the wax buildup. This may be done in severe cases.  Follow these instructions at home:  Take over-the-counter and prescription medicines only as told by your health care provider.  Do not put any objects, including cotton swabs, into your ear. You can clean the opening of your ear canal with a washcloth or facial tissue.  Follow instructions from your health  care provider about cleaning your ears. Do not over-clean your ears.  Drink enough fluid to keep your urine clear or pale yellow. This will help to thin the earwax.  Keep all follow-up visits as told by your health care provider. If earwax builds up in your ears often or if you use hearing aids, consider seeing your health care provider for routine, preventive ear cleanings. Ask your health care provider how often you should schedule your cleanings.  If you have hearing aids, clean them according to instructions from the manufacturer and your health care provider. Contact a health care provider if:  You have ear pain.  You develop a fever.  You have blood, pus, or other fluid coming from your ear.  You have hearing loss.  You have ringing in your ears that does not go away.  Your symptoms do not improve with treatment.  You feel like the room is spinning (vertigo). Summary  Earwax can build up in the ear and cause discomfort or hearing loss.  The most common symptoms of this condition include reduced or muffled hearing and a feeling of fullness in the ear or feeling that the ear is plugged.  This condition may be diagnosed based on your symptoms, your medical history, and an ear exam.  This condition may be treated by using ear drops to soften the earwax or by having the earwax removed by a health care provider.  Do   not put any objects, including cotton swabs, into your ear. You can clean the opening of your ear canal with a washcloth or facial tissue. This information is not intended to replace advice given to you by your health care provider. Make sure you discuss any questions you have with your health care provider. Document Released: 11/15/2004 Document Revised: 12/19/2016 Document Reviewed: 12/19/2016 Elsevier Interactive Patient Education  2018 [employer].  Coping with Quitting Smoking Quitting smoking is a physical and mental challenge. You will face cravings,  withdrawal symptoms, and temptation. Before quitting, work with your health care provider to make a plan that can help you cope. Preparation can help you quit and keep you from giving in. How can I cope with cravings? Cravings usually last for 5-10 minutes. If you get through it, the craving will pass. Consider taking the following actions to help you cope with cravings:  Keep your mouth busy: ? Chew sugar-free gum. ? Suck on hard candies or a straw. ? Brush your teeth.  Keep your hands and body busy: ? Immediately change to a different activity when you feel a craving. ? Squeeze or play with a ball. ? Do an activity or a hobby, like making bead jewelry, practicing needlepoint, or working with wood. ? Mix up your normal routine. ? Take a short exercise break. Go for a quick walk or run up and down stairs. ? Spend time in public places where smoking is not allowed.  Focus on doing something kind or helpful for someone else.  Call a friend or family member to talk during a craving.  Join a support group.  Call a quit line, such as 1-800-QUIT-NOW.  Talk with your health care provider about medicines that might help you cope with cravings and make quitting easier for you.  How can I deal with withdrawal symptoms? Your body may experience negative effects as it tries to get used to not having nicotine in the system. These effects are called withdrawal symptoms. They may include:  Feeling hungrier than normal.  Trouble concentrating.  Irritability.  Trouble sleeping.  Feeling depressed.  Restlessness and agitation.  Craving a cigarette.  To manage withdrawal symptoms:  Avoid places, people, and activities that trigger your cravings.  Remember why you want to quit.  Get plenty of sleep.  Avoid coffee and other caffeinated drinks. These may worsen some of your symptoms.  How can I handle social situations? Social situations can be difficult when you are quitting  smoking, especially in the first few weeks. To manage this, you can:  Avoid parties, bars, and other social situations where people might be smoking.  Avoid alcohol.  Leave right away if you have the urge to smoke.  Explain to your family and friends that you are quitting smoking. Ask for understanding and support.  Plan activities with friends or family where smoking is not an option.  What are some ways I can cope with stress? Wanting to smoke may cause stress, and stress can make you want to smoke. Find ways to manage your stress. Relaxation techniques can help. For example:  Breathe slowly and deeply, in through your nose and out through your mouth.  Listen to soothing, relaxing music.  Talk with a family member or friend about your stress.  Light a candle.  Soak in a bath or take a shower.  Think about a peaceful place.  What are some ways I can prevent weight gain? Be aware that many people gain weight after  they quit smoking. However, not everyone does. To keep from gaining weight, have a plan in place before you quit and stick to the plan after you quit. Your plan should include:  Having healthy snacks. When you have a craving, it may help to: ? Eat plain popcorn, crunchy carrots, celery, or other cut vegetables. ? Chew sugar-free gum.  Changing how you eat: ? Eat small portion sizes at meals. ? Eat 4-6 small meals throughout the day instead of 1-2 large meals a day. ? Be mindful when you eat. Do not watch television or do other things that might distract you as you eat.  Exercising regularly: ? Make time to exercise each day. If you do not have time for a long workout, do short bouts of exercise for 5-10 minutes several times a day. ? Do some form of strengthening exercise, like weight lifting, and some form of aerobic exercise, like running or swimming.  Drinking plenty of water or other low-calorie or no-calorie drinks. Drink 6-8 glasses of water daily, or as  much as instructed by your health care provider.  Summary  Quitting smoking is a physical and mental challenge. You will face cravings, withdrawal symptoms, and temptation to smoke again. Preparation can help you as you go through these challenges.  You can cope with cravings by keeping your mouth busy (such as by chewing gum), keeping your body and hands busy, and making calls to family, friends, or a helpline for people who want to quit smoking.  You can cope with withdrawal symptoms by avoiding places where people smoke, avoiding drinks with caffeine, and getting plenty of rest.  Ask your health care provider about the different ways to prevent weight gain, avoid stress, and handle social situations. This information is not intended to replace advice given to you by your health care provider. Make sure you discuss any questions you have with your health care provider. Document Released: 10/05/2016 Document Revised: 10/05/2016 Document Reviewed: 10/05/2016 Elsevier Interactive Patient Education  [employer]2018 Elsevier Inc.
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
## 2018-02-04 ENCOUNTER — Encounter: Payer: Self-pay | Admitting: Family Medicine

## 2018-02-19 DIAGNOSIS — R310 Gross hematuria: Secondary | ICD-10-CM | POA: Diagnosis not present

## 2018-02-21 ENCOUNTER — Encounter (HOSPITAL_BASED_OUTPATIENT_CLINIC_OR_DEPARTMENT_OTHER): Payer: Self-pay

## 2018-02-28 ENCOUNTER — Encounter (HOSPITAL_BASED_OUTPATIENT_CLINIC_OR_DEPARTMENT_OTHER): Admission: RE | Payer: Self-pay | Source: Ambulatory Visit

## 2018-02-28 ENCOUNTER — Ambulatory Visit (HOSPITAL_BASED_OUTPATIENT_CLINIC_OR_DEPARTMENT_OTHER): Admission: RE | Admit: 2018-02-28 | Payer: PRIVATE HEALTH INSURANCE | Source: Ambulatory Visit | Admitting: Urology

## 2018-02-28 HISTORY — DX: Other specified health status: Z78.9

## 2018-02-28 HISTORY — DX: Personal history of other medical treatment: Z92.89

## 2018-02-28 HISTORY — DX: Neuromuscular dysfunction of bladder, unspecified: N31.9

## 2018-02-28 HISTORY — DX: Personal history of traumatic brain injury: Z87.820

## 2018-02-28 HISTORY — DX: Neurogenic bowel, not elsewhere classified: K59.2

## 2018-02-28 SURGERY — INSERTION, PENILE PROSTHESIS, INFLATABLE
Anesthesia: General

## 2018-04-10 ENCOUNTER — Ambulatory Visit: Payer: Self-pay | Admitting: Physical Medicine & Rehabilitation

## 2018-04-18 ENCOUNTER — Ambulatory Visit: Payer: Self-pay | Admitting: Physical Medicine & Rehabilitation

## 2018-05-05 ENCOUNTER — Ambulatory Visit: Payer: Medicare Other | Admitting: Family Medicine

## 2018-05-05 DIAGNOSIS — Z0289 Encounter for other administrative examinations: Secondary | ICD-10-CM

## 2018-05-26 ENCOUNTER — Ambulatory Visit (INDEPENDENT_AMBULATORY_CARE_PROVIDER_SITE_OTHER): Payer: Medicare Other | Admitting: Neurology

## 2018-05-26 ENCOUNTER — Encounter: Payer: Self-pay | Admitting: Neurology

## 2018-05-26 ENCOUNTER — Other Ambulatory Visit: Payer: Self-pay

## 2018-05-26 VITALS — BP 106/58 | HR 95 | Wt 145.0 lb

## 2018-05-26 DIAGNOSIS — G40009 Localization-related (focal) (partial) idiopathic epilepsy and epileptic syndromes with seizures of localized onset, not intractable, without status epilepticus: Secondary | ICD-10-CM

## 2018-05-26 DIAGNOSIS — G8250 Quadriplegia, unspecified: Secondary | ICD-10-CM

## 2018-05-26 DIAGNOSIS — IMO0001 Reserved for inherently not codable concepts without codable children: Secondary | ICD-10-CM

## 2018-05-26 DIAGNOSIS — G40909 Epilepsy, unspecified, not intractable, without status epilepticus: Secondary | ICD-10-CM | POA: Diagnosis not present

## 2018-05-26 DIAGNOSIS — F0150 Vascular dementia without behavioral disturbance: Secondary | ICD-10-CM

## 2018-05-26 DIAGNOSIS — F0280 Dementia in other diseases classified elsewhere without behavioral disturbance: Secondary | ICD-10-CM

## 2018-05-26 DIAGNOSIS — S069X9S Unspecified intracranial injury with loss of consciousness of unspecified duration, sequela: Secondary | ICD-10-CM

## 2018-05-26 MED ORDER — DIVALPROEX SODIUM ER 500 MG PO TB24
ORAL_TABLET | ORAL | 11 refills | Status: DC
Start: 2018-05-26 — End: 2018-05-27
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
## 2018-05-26 NOTE — Patient Instructions (Signed)
1. Continue Depakote ER 500mg : Take 2 tabs in AM, 3 tabs in PM 2. Bloodwork for CBC, CMP 3. Discuss pressure sores with Dr. Wynn BankerKirsteins 4. Follow-up in 8 months, call for any changes  Seizure Precautions: 1. If medication has been prescribed for you to prevent seizures, take it exactly as directed.  Do not stop taking the medicine without talking to your doctor first, even if you have not had a seizure in a long time.   2. Avoid activities in which a seizure would cause danger to yourself or to others.  Don't operate dangerous machinery, swim alone, or climb in high or dangerous places, such as on ladders, roofs, or girders.  Do not drive unless your doctor says you may.  3. If you have any warning that you may have a seizure, lay down in a safe place where you can't hurt yourself.    4.  No driving for 6 months from last seizure, as [place] [facility] [place].   Please refer to the following link on the [employer]ile[employer] of [employer]'s website for more information: http://www.epilepsyfoundation.org/answerplace/Social/driving/drivingu.cfm   5.  Maintain good sleep hygiene. Avoid alcohol.  6.  Contact your doctor if you have any problems that may be related to the medicine you are taking.  7.  Call 911 and bring the patient back to the ED if:        A.  The seizure lasts longer than 5 minutes.       B.  The patient doesn't awaken shortly after the seizure  C.  The patient has new problems such as difficulty seeing, speaking or moving  D.  The patient was injured during the seizure  E.  The patient has a temperature over 102 F (39C)  F.  The patient vomited and now is having trouble breathing

## 2018-05-27 ENCOUNTER — Other Ambulatory Visit: Payer: Self-pay

## 2018-05-27 DIAGNOSIS — G40009 Localization-related (focal) (partial) idiopathic epilepsy and epileptic syndromes with seizures of localized onset, not intractable, without status epilepticus: Secondary | ICD-10-CM

## 2018-05-27 MED ORDER — DIVALPROEX SODIUM ER 500 MG PO TB24: ORAL_TABLET | ORAL | 11 refills | Status: DC

## 2018-06-02 ENCOUNTER — Other Ambulatory Visit: Payer: Medicare Other

## 2018-06-03 LAB — CBC WITH DIFFERENTIAL/PLATELET
Basophils Absolute: 38 cells/uL (ref 0–200)
Basophils Relative: 0.5 %
Eosinophils Absolute: 68 cells/uL (ref 15–500)
Eosinophils Relative: 0.9 %
HCT: 39.9 % (ref 38.5–50.0)
Hemoglobin: 13.6 g/dL (ref 13.2–17.1)
Lymphs Abs: 2123 cells/uL (ref 850–3900)
MCH: 31.9 pg (ref 27.0–33.0)
MCHC: 34.1 g/dL (ref 32.0–36.0)
MCV: 93.7 fL (ref 80.0–100.0)
MPV: 11.3 fL (ref 7.5–12.5)
Monocytes Relative: 11.6 %
Neutro Abs: 4403 cells/uL (ref 1500–7800)
Neutrophils Relative %: 58.7 %
Platelets: 155 10*3/uL (ref 140–400)
RBC: 4.26 10*6/uL (ref 4.20–5.80)
RDW: 14.4 % (ref 11.0–15.0)
Total Lymphocyte: 28.3 %
WBC mixed population: 870 cells/uL (ref 200–950)
WBC: 7.5 10*3/uL (ref 3.8–10.8)

## 2018-06-03 LAB — COMPREHENSIVE METABOLIC PANEL
AG Ratio: 0.8 (calc) — ABNORMAL LOW (ref 1.0–2.5)
ALT: 5 U/L — ABNORMAL LOW (ref 9–46)
AST: 15 U/L (ref 10–40)
Albumin: 3.1 g/dL — ABNORMAL LOW (ref 3.6–5.1)
Alkaline phosphatase (APISO): 67 U/L (ref 40–115)
BUN: 15 mg/dL (ref 7–25)
CO2: 28 mmol/L (ref 20–32)
Calcium: 8.8 mg/dL (ref 8.6–10.3)
Chloride: 104 mmol/L (ref 98–110)
Creat: 0.81 mg/dL (ref 0.60–1.35)
Globulin: 4 g/dL (calc) — ABNORMAL HIGH (ref 1.9–3.7)
Glucose, Bld: 67 mg/dL (ref 65–99)
Potassium: 4.6 mmol/L (ref 3.5–5.3)
Sodium: 141 mmol/L (ref 135–146)
Total Bilirubin: 0.5 mg/dL (ref 0.2–1.2)
Total Protein: 7.1 g/dL (ref 6.1–8.1)

## 2018-06-05 ENCOUNTER — Telehealth: Payer: Self-pay

## 2018-06-05 NOTE — Telephone Encounter (Signed)
Called pt. No answer.  LMOM asking for return call to relay message below.  

## 2018-06-05 NOTE — Telephone Encounter (Signed)
-----   Message from Van ClinesKaren M Aquino, MD sent at 06/04/2018  2:57 PM EDT ----- Pls let him know his blood counts, liver, and kidney function are normal, thanks

## 2018-09-15 DIAGNOSIS — N311 Reflex neuropathic bladder, not elsewhere classified: Secondary | ICD-10-CM | POA: Diagnosis not present

## 2018-09-22 DIAGNOSIS — N311 Reflex neuropathic bladder, not elsewhere classified: Secondary | ICD-10-CM | POA: Diagnosis not present

## 2018-12-15 ENCOUNTER — Telehealth: Payer: Self-pay

## 2018-12-15 NOTE — Telephone Encounter (Signed)
Pt called stating [facility] [phone] needs documentation about the provider so he get pressure pads.  I called [facility] and placed on hold. Will try again later.

## 2018-12-16 NOTE — Telephone Encounter (Signed)
Not sure what pt wants , is this a wheelchair cushion?

## 2018-12-18 NOTE — Telephone Encounter (Signed)
Left message for [facility] [place] to call back with more information about the pads he is needing.

## 2019-01-09 ENCOUNTER — Encounter: Payer: Worker's Compensation | Attending: Physical Medicine & Rehabilitation

## 2019-01-09 ENCOUNTER — Ambulatory Visit: Payer: Self-pay | Admitting: Physical Medicine & Rehabilitation

## 2019-01-14 ENCOUNTER — Telehealth: Payer: Self-pay | Admitting: Neurology

## 2019-01-14 NOTE — Telephone Encounter (Signed)
Spoke with patient and verified that they do have a video compatible device, and plan on using a smart phone for the visit. Best number to reach them is [phone]. Email: none

## 2019-01-19 ENCOUNTER — Telehealth (INDEPENDENT_AMBULATORY_CARE_PROVIDER_SITE_OTHER): Payer: Medicare Other | Admitting: Neurology

## 2019-01-19 ENCOUNTER — Other Ambulatory Visit: Payer: Self-pay

## 2019-01-19 DIAGNOSIS — S069X9S Unspecified intracranial injury with loss of consciousness of unspecified duration, sequela: Secondary | ICD-10-CM

## 2019-01-19 DIAGNOSIS — G40009 Localization-related (focal) (partial) idiopathic epilepsy and epileptic syndromes with seizures of localized onset, not intractable, without status epilepticus: Secondary | ICD-10-CM | POA: Diagnosis not present

## 2019-01-19 DIAGNOSIS — F0280 Dementia in other diseases classified elsewhere without behavioral disturbance: Secondary | ICD-10-CM

## 2019-01-19 DIAGNOSIS — IMO0001 Reserved for inherently not codable concepts without codable children: Secondary | ICD-10-CM

## 2019-01-19 MED ORDER — DIVALPROEX SODIUM ER 500 MG PO TB24: ORAL_TABLET | ORAL | 11 refills | Status: DC
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
## 2019-01-23 ENCOUNTER — Ambulatory Visit: Payer: Self-pay | Admitting: Neurology

## 2019-04-16 ENCOUNTER — Encounter (HOSPITAL_COMMUNITY): Payer: Self-pay | Admitting: Emergency Medicine

## 2019-04-16 ENCOUNTER — Ambulatory Visit (HOSPITAL_COMMUNITY)
Admission: EM | Admit: 2019-04-16 | Discharge: 2019-04-16 | Disposition: A | Discharge status: Home / Self Care | Payer: Medicare Other | Attending: Internal Medicine | Admitting: Internal Medicine

## 2019-04-16 ENCOUNTER — Other Ambulatory Visit: Payer: Self-pay

## 2019-04-16 DIAGNOSIS — W57XXXA Bitten or stung by nonvenomous insect and other nonvenomous arthropods, initial encounter: Secondary | ICD-10-CM | POA: Diagnosis not present

## 2019-04-16 DIAGNOSIS — S00561A Insect bite (nonvenomous) of lip, initial encounter: Secondary | ICD-10-CM | POA: Diagnosis not present

## 2019-04-16 MED ORDER — PREDNISONE 10 MG PO TABS: 20.0000 mg | ORAL_TABLET | Freq: Every day | ORAL | 0 refills | Status: AC

## 2019-04-16 NOTE — ED Triage Notes (Signed)
Pt states something stung him on his left upper lip yesterday.  He has some swelling in the area.
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
## 2019-05-04 ENCOUNTER — Telehealth: Payer: Self-pay | Admitting: Physical Medicine & Rehabilitation

## 2019-05-04 NOTE — Telephone Encounter (Signed)
Patient called to set up an appointment with Dr. Letta Pate for updated wheelchair measurements.  Clarise Cruz with [facility] told him he needed to make an appointment with Kirsteins.  I set him up for 05/26/23.  There were some codes that patient said she needed.

## 2019-05-04 NOTE — Telephone Encounter (Signed)
Please set this up with Zella Ball she does most of the wheelchair evaluations.

## 2019-05-05 NOTE — Telephone Encounter (Signed)
[name] doesn't have any available times on Monday 7/20 and you did.  I have given you 30 min for this visit.  The Clarise Cruz with [facility] is going to fax over paperwork, she just said their was some verbiage that needs to make it to your note.  She said it isn't a wheelchair eval.  If we need to change that appointment, it may be the week after before [name] has a 40 min opening.

## 2019-05-08 ENCOUNTER — Other Ambulatory Visit: Payer: Self-pay

## 2019-05-08 ENCOUNTER — Encounter: Payer: Self-pay | Admitting: Registered Nurse

## 2019-05-08 ENCOUNTER — Encounter: Payer: Worker's Compensation | Attending: Registered Nurse | Admitting: Registered Nurse

## 2019-05-08 VITALS — BP 99/65 | HR 90 | Temp 98.7°F | Resp 12

## 2019-05-08 DIAGNOSIS — K592 Neurogenic bowel, not elsewhere classified: Secondary | ICD-10-CM | POA: Insufficient documentation

## 2019-05-08 DIAGNOSIS — Z89512 Acquired absence of left leg below knee: Secondary | ICD-10-CM | POA: Diagnosis present

## 2019-05-08 DIAGNOSIS — Z87828 Personal history of other (healed) physical injury and trauma: Secondary | ICD-10-CM | POA: Insufficient documentation

## 2019-05-08 DIAGNOSIS — Z89511 Acquired absence of right leg below knee: Secondary | ICD-10-CM | POA: Diagnosis present

## 2019-05-08 DIAGNOSIS — N319 Neuromuscular dysfunction of bladder, unspecified: Secondary | ICD-10-CM | POA: Diagnosis not present
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
## 2019-05-08 NOTE — Patient Instructions (Signed)
Please Discuss with Physical Therapist regarding Pressure relief Cushion.

## 2019-05-11 ENCOUNTER — Ambulatory Visit: Payer: Medicare Other | Admitting: Physical Medicine & Rehabilitation

## 2019-05-25 ENCOUNTER — Other Ambulatory Visit: Payer: Self-pay | Admitting: Neurology

## 2019-05-25 DIAGNOSIS — G40009 Localization-related (focal) (partial) idiopathic epilepsy and epileptic syndromes with seizures of localized onset, not intractable, without status epilepticus: Secondary | ICD-10-CM

## 2019-06-25 ENCOUNTER — Other Ambulatory Visit: Payer: Self-pay

## 2019-06-25 ENCOUNTER — Emergency Department (HOSPITAL_COMMUNITY)
Admission: EM | Admit: 2019-06-25 | Discharge: 2019-06-25 | Disposition: A | Discharge status: Home / Self Care | Payer: PRIVATE HEALTH INSURANCE | Attending: Emergency Medicine | Admitting: Emergency Medicine

## 2019-06-25 ENCOUNTER — Encounter (HOSPITAL_COMMUNITY): Payer: Self-pay

## 2019-06-25 DIAGNOSIS — R0902 Hypoxemia: Secondary | ICD-10-CM | POA: Diagnosis not present

## 2019-06-25 DIAGNOSIS — G8250 Quadriplegia, unspecified: Secondary | ICD-10-CM | POA: Insufficient documentation

## 2019-06-25 DIAGNOSIS — G40909 Epilepsy, unspecified, not intractable, without status epilepticus: Secondary | ICD-10-CM | POA: Diagnosis not present

## 2019-06-25 DIAGNOSIS — R410 Disorientation, unspecified: Secondary | ICD-10-CM | POA: Diagnosis not present

## 2019-06-25 DIAGNOSIS — N390 Urinary tract infection, site not specified: Secondary | ICD-10-CM | POA: Diagnosis not present

## 2019-06-25 DIAGNOSIS — R569 Unspecified convulsions: Secondary | ICD-10-CM | POA: Diagnosis not present

## 2019-06-25 DIAGNOSIS — I10 Essential (primary) hypertension: Secondary | ICD-10-CM | POA: Diagnosis not present

## 2019-06-25 DIAGNOSIS — R61 Generalized hyperhidrosis: Secondary | ICD-10-CM | POA: Diagnosis not present

## 2019-06-25 DIAGNOSIS — F17210 Nicotine dependence, cigarettes, uncomplicated: Secondary | ICD-10-CM | POA: Insufficient documentation

## 2019-06-25 DIAGNOSIS — T675XXA Heat exhaustion, unspecified, initial encounter: Secondary | ICD-10-CM | POA: Diagnosis not present

## 2019-06-25 DIAGNOSIS — I959 Hypotension, unspecified: Secondary | ICD-10-CM | POA: Diagnosis not present

## 2019-06-25 DIAGNOSIS — R55 Syncope and collapse: Secondary | ICD-10-CM | POA: Insufficient documentation

## 2019-06-25 LAB — URINALYSIS, ROUTINE W REFLEX MICROSCOPIC
Bilirubin Urine: NEGATIVE
Glucose, UA: NEGATIVE mg/dL
Ketones, ur: NEGATIVE mg/dL
Nitrite: POSITIVE — AB
Protein, ur: NEGATIVE mg/dL
Specific Gravity, Urine: 1.011 (ref 1.005–1.030)
WBC, UA: >50 WBC/hpf — ABNORMAL HIGH (ref 0–5)
pH: 6 (ref 5.0–8.0)

## 2019-06-25 LAB — BASIC METABOLIC PANEL
Anion gap: 11 (ref 5–15)
BUN: 16 mg/dL (ref 6–20)
CO2: 22 mmol/L (ref 22–32)
Calcium: 8.4 mg/dL — ABNORMAL LOW (ref 8.9–10.3)
Chloride: 107 mmol/L (ref 98–111)
Creatinine, Ser: 0.89 mg/dL (ref 0.61–1.24)
GFR calc Af Amer: >60 mL/min (ref >60)
GFR calc non Af Amer: >60 mL/min (ref >60)
Glucose, Bld: 75 mg/dL (ref 70–99)
Potassium: 4 mmol/L (ref 3.5–5.1)
Sodium: 140 mmol/L (ref 135–145)

## 2019-06-25 LAB — CBC
HCT: 41.5 % (ref 39.0–52.0)
Hemoglobin: 13.6 g/dL (ref 13.0–17.0)
MCH: 31.3 pg (ref 26.0–34.0)
MCHC: 32.8 g/dL (ref 30.0–36.0)
MCV: 95.6 fL (ref 80.0–100.0)
Platelets: 146 10*3/uL — ABNORMAL LOW (ref 150–400)
RBC: 4.34 MIL/uL (ref 4.22–5.81)
RDW: 13.6 % (ref 11.5–15.5)
WBC: 4.2 10*3/uL (ref 4.0–10.5)
nRBC: 0 % (ref 0.0–0.2)

## 2019-06-25 LAB — VALPROIC ACID LEVEL: Valproic Acid Lvl: 93 ug/mL (ref 50.0–100.0)

## 2019-06-25 MED ORDER — CEPHALEXIN 500 MG PO CAPS: 500.0000 mg | ORAL_CAPSULE | Freq: Four times a day (QID) | ORAL | 0 refills | Status: DC

## 2019-06-25 MED ORDER — SODIUM CHLORIDE 0.9 % IV SOLN
1.0000 g | Freq: Once | INTRAVENOUS | Status: AC
  Administered 2019-06-25: 1 g via INTRAVENOUS
  Filled 2019-06-25: qty 10
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
## 2019-06-25 NOTE — ED Notes (Signed)
Patient verbalizes understanding of discharge instructions. Opportunity for questioning and answers were provided. Armband removed by staff, pt discharged from ED via wheelchair w/ sister

## 2019-06-25 NOTE — Discharge Instructions (Signed)
It was our pleasure to provide your ER care today - we hope that you feel better.  Drink plenty of fluids. Stay in cool area for the rest of the day today.  The lab tests show a urine infection - take antibiotic as prescribed.  Follow up with your doctor in the next couple days if symptoms fail to fully resolve.  Return to ER if worse, new symptoms, fevers, new or severe pain, vomiting, weak/faint, or other concern.
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
## 2019-06-26 LAB — URINE CULTURE

## 2019-08-05 DIAGNOSIS — R278 Other lack of coordination: Secondary | ICD-10-CM | POA: Diagnosis not present

## 2019-08-05 DIAGNOSIS — Z87828 Personal history of other (healed) physical injury and trauma: Secondary | ICD-10-CM | POA: Diagnosis not present

## 2019-08-05 DIAGNOSIS — G3184 Mild cognitive impairment, so stated: Secondary | ICD-10-CM | POA: Diagnosis not present

## 2019-08-05 DIAGNOSIS — Z7409 Other reduced mobility: Secondary | ICD-10-CM | POA: Diagnosis not present

## 2019-08-05 DIAGNOSIS — R29898 Other symptoms and signs involving the musculoskeletal system: Secondary | ICD-10-CM | POA: Diagnosis not present

## 2019-10-05 ENCOUNTER — Other Ambulatory Visit: Payer: Self-pay | Admitting: Neurology

## 2019-10-05 DIAGNOSIS — G40009 Localization-related (focal) (partial) idiopathic epilepsy and epileptic syndromes with seizures of localized onset, not intractable, without status epilepticus: Secondary | ICD-10-CM

## 2019-10-21 ENCOUNTER — Telehealth: Payer: Self-pay | Admitting: *Deleted

## 2019-10-21 NOTE — Telephone Encounter (Signed)
[name] asked for a call back because he has a question. I called him back and he is wanting to know if there is an orthodontist in the area that takes medicare.  I have told him we do not have that information.

## 2019-11-11 ENCOUNTER — Other Ambulatory Visit: Payer: Self-pay | Admitting: Neurology

## 2019-11-11 DIAGNOSIS — G40009 Localization-related (focal) (partial) idiopathic epilepsy and epileptic syndromes with seizures of localized onset, not intractable, without status epilepticus: Secondary | ICD-10-CM

## 2019-12-02 DIAGNOSIS — R300 Dysuria: Secondary | ICD-10-CM | POA: Diagnosis not present

## 2019-12-14 ENCOUNTER — Encounter: Payer: Self-pay | Admitting: Neurology

## 2019-12-21 ENCOUNTER — Encounter: Payer: Self-pay | Admitting: Neurology

## 2019-12-21 ENCOUNTER — Telehealth (INDEPENDENT_AMBULATORY_CARE_PROVIDER_SITE_OTHER): Payer: Medicare Other | Admitting: Neurology

## 2019-12-21 ENCOUNTER — Other Ambulatory Visit: Payer: Self-pay

## 2019-12-21 DIAGNOSIS — F0280 Dementia in other diseases classified elsewhere without behavioral disturbance: Secondary | ICD-10-CM | POA: Diagnosis not present

## 2019-12-21 DIAGNOSIS — S069X9S Unspecified intracranial injury with loss of consciousness of unspecified duration, sequela: Secondary | ICD-10-CM

## 2019-12-21 DIAGNOSIS — G40009 Localization-related (focal) (partial) idiopathic epilepsy and epileptic syndromes with seizures of localized onset, not intractable, without status epilepticus: Secondary | ICD-10-CM | POA: Diagnosis not present

## 2019-12-21 DIAGNOSIS — IMO0001 Reserved for inherently not codable concepts without codable children: Secondary | ICD-10-CM

## 2019-12-21 MED ORDER — DIVALPROEX SODIUM ER 500 MG PO TB24: ORAL_TABLET | ORAL | 11 refills | Status: DC
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
## 2019-12-29 ENCOUNTER — Other Ambulatory Visit: Payer: Self-pay

## 2019-12-29 ENCOUNTER — Encounter (HOSPITAL_COMMUNITY): Payer: Self-pay | Admitting: Emergency Medicine

## 2019-12-29 ENCOUNTER — Inpatient Hospital Stay (HOSPITAL_COMMUNITY)
Admission: EM | Admit: 2019-12-29 | Discharge: 2020-01-03 | DRG: 463 | Disposition: A | Discharge status: Home Health | Payer: Medicare Other | Attending: Internal Medicine | Admitting: Internal Medicine

## 2019-12-29 ENCOUNTER — Emergency Department (HOSPITAL_COMMUNITY): Payer: Medicare Other

## 2019-12-29 DIAGNOSIS — K592 Neurogenic bowel, not elsewhere classified: Secondary | ICD-10-CM | POA: Diagnosis present

## 2019-12-29 DIAGNOSIS — A419 Sepsis, unspecified organism: Secondary | ICD-10-CM | POA: Diagnosis not present

## 2019-12-29 DIAGNOSIS — L89224 Pressure ulcer of left hip, stage 4: Secondary | ICD-10-CM | POA: Diagnosis not present

## 2019-12-29 DIAGNOSIS — E876 Hypokalemia: Secondary | ICD-10-CM | POA: Diagnosis present

## 2019-12-29 DIAGNOSIS — T8781 Dehiscence of amputation stump: Secondary | ICD-10-CM | POA: Diagnosis not present

## 2019-12-29 DIAGNOSIS — E440 Moderate protein-calorie malnutrition: Secondary | ICD-10-CM | POA: Insufficient documentation

## 2019-12-29 DIAGNOSIS — L089 Local infection of the skin and subcutaneous tissue, unspecified: Secondary | ICD-10-CM | POA: Diagnosis present

## 2019-12-29 DIAGNOSIS — Z20822 Contact with and (suspected) exposure to covid-19: Secondary | ICD-10-CM | POA: Diagnosis present

## 2019-12-29 DIAGNOSIS — Y835 Amputation of limb(s) as the cause of abnormal reaction of the patient, or of later complication, without mention of misadventure at the time of the procedure: Secondary | ICD-10-CM | POA: Diagnosis present

## 2019-12-29 DIAGNOSIS — R52 Pain, unspecified: Secondary | ICD-10-CM

## 2019-12-29 DIAGNOSIS — G40909 Epilepsy, unspecified, not intractable, without status epilepticus: Secondary | ICD-10-CM | POA: Diagnosis present

## 2019-12-29 DIAGNOSIS — R600 Localized edema: Secondary | ICD-10-CM | POA: Diagnosis not present

## 2019-12-29 DIAGNOSIS — G8250 Quadriplegia, unspecified: Secondary | ICD-10-CM | POA: Diagnosis present

## 2019-12-29 DIAGNOSIS — F17210 Nicotine dependence, cigarettes, uncomplicated: Secondary | ICD-10-CM | POA: Diagnosis present

## 2019-12-29 DIAGNOSIS — G8253 Quadriplegia, C5-C7 complete: Secondary | ICD-10-CM | POA: Diagnosis present

## 2019-12-29 DIAGNOSIS — R4189 Other symptoms and signs involving cognitive functions and awareness: Secondary | ICD-10-CM | POA: Diagnosis present

## 2019-12-29 DIAGNOSIS — I96 Gangrene, not elsewhere classified: Secondary | ICD-10-CM | POA: Diagnosis present

## 2019-12-29 DIAGNOSIS — R0689 Other abnormalities of breathing: Secondary | ICD-10-CM | POA: Diagnosis not present

## 2019-12-29 DIAGNOSIS — S14117S Complete lesion at C7 level of cervical spinal cord, sequela: Secondary | ICD-10-CM | POA: Diagnosis not present

## 2019-12-29 DIAGNOSIS — T148XXA Other injury of unspecified body region, initial encounter: Secondary | ICD-10-CM | POA: Diagnosis not present

## 2019-12-29 DIAGNOSIS — L97919 Non-pressure chronic ulcer of unspecified part of right lower leg with unspecified severity: Secondary | ICD-10-CM | POA: Diagnosis not present

## 2019-12-29 DIAGNOSIS — L89223 Pressure ulcer of left hip, stage 3: Secondary | ICD-10-CM

## 2019-12-29 DIAGNOSIS — Z87820 Personal history of traumatic brain injury: Secondary | ICD-10-CM | POA: Diagnosis not present

## 2019-12-29 DIAGNOSIS — Z03818 Encounter for observation for suspected exposure to other biological agents ruled out: Secondary | ICD-10-CM | POA: Diagnosis not present

## 2019-12-29 DIAGNOSIS — R569 Unspecified convulsions: Secondary | ICD-10-CM | POA: Diagnosis not present

## 2019-12-29 DIAGNOSIS — Z6824 Body mass index (BMI) 24.0-24.9, adult: Secondary | ICD-10-CM

## 2019-12-29 DIAGNOSIS — Z87898 Personal history of other specified conditions: Secondary | ICD-10-CM

## 2019-12-29 DIAGNOSIS — Z8249 Family history of ischemic heart disease and other diseases of the circulatory system: Secondary | ICD-10-CM | POA: Diagnosis not present

## 2019-12-29 DIAGNOSIS — N319 Neuromuscular dysfunction of bladder, unspecified: Secondary | ICD-10-CM | POA: Diagnosis not present

## 2019-12-29 DIAGNOSIS — Z89511 Acquired absence of right leg below knee: Secondary | ICD-10-CM

## 2019-12-29 DIAGNOSIS — I959 Hypotension, unspecified: Secondary | ICD-10-CM | POA: Diagnosis not present

## 2019-12-29 DIAGNOSIS — R5381 Other malaise: Secondary | ICD-10-CM | POA: Diagnosis not present

## 2019-12-29 DIAGNOSIS — T8754 Necrosis of amputation stump, left lower extremity: Secondary | ICD-10-CM | POA: Diagnosis not present

## 2019-12-29 DIAGNOSIS — T8744 Infection of amputation stump, left lower extremity: Principal | ICD-10-CM | POA: Diagnosis present

## 2019-12-29 HISTORY — DX: Local infection of the skin and subcutaneous tissue, unspecified: L08.9

## 2019-12-29 LAB — COMPREHENSIVE METABOLIC PANEL
ALT: 11 U/L (ref 0–44)
AST: 18 U/L (ref 15–41)
Albumin: 1.6 g/dL — ABNORMAL LOW (ref 3.5–5.0)
Alkaline Phosphatase: 60 U/L (ref 38–126)
Anion gap: 9 (ref 5–15)
BUN: 17 mg/dL (ref 6–20)
CO2: 25 mmol/L (ref 22–32)
Calcium: 7.7 mg/dL — ABNORMAL LOW (ref 8.9–10.3)
Chloride: 101 mmol/L (ref 98–111)
Creatinine, Ser: 0.88 mg/dL (ref 0.61–1.24)
GFR calc Af Amer: >60 mL/min (ref >60)
GFR calc non Af Amer: >60 mL/min (ref >60)
Glucose, Bld: 79 mg/dL (ref 70–99)
Potassium: 4.1 mmol/L (ref 3.5–5.1)
Sodium: 135 mmol/L (ref 135–145)
Total Bilirubin: 0.9 mg/dL (ref 0.3–1.2)
Total Protein: 6 g/dL — ABNORMAL LOW (ref 6.5–8.1)

## 2019-12-29 LAB — CBC WITH DIFFERENTIAL/PLATELET
Abs Immature Granulocytes: 0.09 10*3/uL — ABNORMAL HIGH (ref 0.00–0.07)
Basophils Absolute: 0 10*3/uL (ref 0.0–0.1)
Basophils Relative: 0 %
Eosinophils Absolute: 0 10*3/uL (ref 0.0–0.5)
Eosinophils Relative: 0 %
HCT: 29.9 % — ABNORMAL LOW (ref 39.0–52.0)
Hemoglobin: 9.8 g/dL — ABNORMAL LOW (ref 13.0–17.0)
Immature Granulocytes: 1 %
Lymphocytes Relative: 9 %
Lymphs Abs: 1.2 10*3/uL (ref 0.7–4.0)
MCH: 31.6 pg (ref 26.0–34.0)
MCHC: 32.8 g/dL (ref 30.0–36.0)
MCV: 96.5 fL (ref 80.0–100.0)
Monocytes Absolute: 2.3 10*3/uL — ABNORMAL HIGH (ref 0.1–1.0)
Monocytes Relative: 19 %
Neutro Abs: 8.7 10*3/uL — ABNORMAL HIGH (ref 1.7–7.7)
Neutrophils Relative %: 71 %
Platelets: 175 10*3/uL (ref 150–400)
RBC: 3.1 MIL/uL — ABNORMAL LOW (ref 4.22–5.81)
RDW: 14.2 % (ref 11.5–15.5)
WBC: 12.3 10*3/uL — ABNORMAL HIGH (ref 4.0–10.5)
nRBC: 0.2 % (ref 0.0–0.2)

## 2019-12-29 LAB — POC SARS CORONAVIRUS 2 AG -  ED: SARS Coronavirus 2 Ag: NEGATIVE

## 2019-12-29 LAB — C-REACTIVE PROTEIN: CRP: 20.7 mg/dL — ABNORMAL HIGH (ref <1.0)

## 2019-12-29 MED ORDER — ACETAMINOPHEN 650 MG RE SUPP: 650.0000 mg | Freq: Four times a day (QID) | RECTAL | Status: DC | PRN

## 2019-12-29 MED ORDER — ACETAMINOPHEN 325 MG PO TABS: 650.0000 mg | ORAL_TABLET | Freq: Four times a day (QID) | ORAL | Status: DC | PRN

## 2019-12-29 MED ORDER — ONDANSETRON HCL 4 MG PO TABS: 4.0000 mg | ORAL_TABLET | Freq: Four times a day (QID) | ORAL | Status: DC | PRN

## 2019-12-29 MED ORDER — PIPERACILLIN-TAZOBACTAM 3.375 G IVPB 30 MIN
3.3750 g | Freq: Once | INTRAVENOUS | Status: DC
  Filled 2019-12-29: qty 50

## 2019-12-29 MED ORDER — VANCOMYCIN HCL IN DEXTROSE 1-5 GM/200ML-% IV SOLN
1000.0000 mg | Freq: Once | INTRAVENOUS | Status: AC
  Administered 2019-12-29: 1000 mg via INTRAVENOUS
  Filled 2019-12-29: qty 200

## 2019-12-29 MED ORDER — DIVALPROEX SODIUM ER 500 MG PO TB24
500.0000 mg | ORAL_TABLET | Freq: Three times a day (TID) | ORAL | Status: DC
  Administered 2019-12-30 – 2020-01-03 (×14): 500 mg via ORAL
  Filled 2019-12-29 (×18): qty 1

## 2019-12-29 MED ORDER — SODIUM CHLORIDE 0.9 % IV SOLN
1.0000 g | INTRAVENOUS | Status: DC
  Administered 2019-12-29 – 2020-01-02 (×5): 1 g via INTRAVENOUS
  Filled 2019-12-29 (×2): qty 1
  Filled 2019-12-29: qty 10
  Filled 2019-12-29 (×2): qty 1
  Filled 2019-12-29: qty 10

## 2019-12-29 MED ORDER — SODIUM CHLORIDE 0.9 % IV SOLN
INTRAVENOUS | Status: DC | PRN
  Administered 2019-12-29: 1000 mL via INTRAVENOUS

## 2019-12-29 MED ORDER — ONDANSETRON HCL 4 MG/2ML IJ SOLN
4.0000 mg | Freq: Four times a day (QID) | INTRAMUSCULAR | Status: DC | PRN
  Administered 2020-01-01: 4 mg via INTRAVENOUS

## 2019-12-29 MED ORDER — VANCOMYCIN VARIABLE DOSE PER UNSTABLE RENAL FUNCTION (PHARMACIST DOSING): Status: DC
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
## 2019-12-29 NOTE — Progress Notes (Signed)
Pharmacy Antibiotic Note  [name] is a 49 y.o. male admitted on 12/29/2019 with Cellulitis/ concern for osteo.  Pharmacy has been consulted for Vancomycin dosing.    No data recorded.  Recent Labs  Lab 12/29/19 1930  WBC 12.3*  CREATININE 0.88    CrCl cannot be calculated (Unknown ideal weight.).    No Known Allergies  Antimicrobials this admission: 3/9 Ctx >>  3/9 Vancomycin >>   Dose adjustments this admission:   Microbiology results: Pending   Plan:  - No loading dose of Vancomycin  - Patient has bilateral below the knee amputations - With bilateral amputations will dose vancomycin based on levels - Empirically believe that vancomycin 1000mg  IV q24hr would be appropriate.  - Will obtain a Vancomycin random level ~12 hours after initial 1000mg  IV dose given in the ED.  - Monitor patients renal function and monitor urine output  Thank you for allowing pharmacy to be a part of this patient's care.  PharmD. BCPS  12/29/2019 11:04 PM

## 2019-12-29 NOTE — ED Triage Notes (Signed)
Per [facility] pt from home. Bilat lower extremity amputation. 2 months ago pt says he hit L suture site and never got it checked. Stump is black, red, and tender. Pt has stage 3 pressure wound to L hip, puss and discharge present.
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
## 2019-12-30 DIAGNOSIS — T148XXA Other injury of unspecified body region, initial encounter: Secondary | ICD-10-CM

## 2019-12-30 DIAGNOSIS — L89223 Pressure ulcer of left hip, stage 3: Secondary | ICD-10-CM

## 2019-12-30 DIAGNOSIS — G8250 Quadriplegia, unspecified: Secondary | ICD-10-CM

## 2019-12-30 DIAGNOSIS — N319 Neuromuscular dysfunction of bladder, unspecified: Secondary | ICD-10-CM

## 2019-12-30 DIAGNOSIS — L089 Local infection of the skin and subcutaneous tissue, unspecified: Secondary | ICD-10-CM

## 2019-12-30 DIAGNOSIS — R569 Unspecified convulsions: Secondary | ICD-10-CM

## 2019-12-30 DIAGNOSIS — T8781 Dehiscence of amputation stump: Secondary | ICD-10-CM

## 2019-12-30 DIAGNOSIS — L89224 Pressure ulcer of left hip, stage 4: Secondary | ICD-10-CM

## 2019-12-30 LAB — VANCOMYCIN, RANDOM
Vancomycin Rm: 6
Vancomycin Rm: 10

## 2019-12-30 LAB — CBC
HCT: 28 % — ABNORMAL LOW (ref 39.0–52.0)
Hemoglobin: 9.3 g/dL — ABNORMAL LOW (ref 13.0–17.0)
MCH: 31.2 pg (ref 26.0–34.0)
MCHC: 33.2 g/dL (ref 30.0–36.0)
MCV: 94 fL (ref 80.0–100.0)
Platelets: 191 10*3/uL (ref 150–400)
RBC: 2.98 MIL/uL — ABNORMAL LOW (ref 4.22–5.81)
RDW: 14 % (ref 11.5–15.5)
WBC: 9.8 10*3/uL (ref 4.0–10.5)
nRBC: 0 % (ref 0.0–0.2)

## 2019-12-30 LAB — VALPROIC ACID LEVEL: Valproic Acid Lvl: 61 ug/mL (ref 50.0–100.0)

## 2019-12-30 LAB — BASIC METABOLIC PANEL
Anion gap: 12 (ref 5–15)
BUN: 15 mg/dL (ref 6–20)
CO2: 25 mmol/L (ref 22–32)
Calcium: 8.1 mg/dL — ABNORMAL LOW (ref 8.9–10.3)
Chloride: 100 mmol/L (ref 98–111)
Creatinine, Ser: 0.72 mg/dL (ref 0.61–1.24)
GFR calc Af Amer: >60 mL/min (ref >60)
GFR calc non Af Amer: >60 mL/min (ref >60)
Glucose, Bld: 77 mg/dL (ref 70–99)
Potassium: 3.4 mmol/L — ABNORMAL LOW (ref 3.5–5.1)
Sodium: 137 mmol/L (ref 135–145)

## 2019-12-30 LAB — SARS CORONAVIRUS 2 (TAT 6-24 HRS): SARS Coronavirus 2: NEGATIVE

## 2019-12-30 LAB — HIV ANTIBODY (ROUTINE TESTING W REFLEX): HIV Screen 4th Generation wRfx: NONREACTIVE

## 2019-12-30 MED ORDER — VANCOMYCIN HCL 750 MG/150ML IV SOLN
750.0000 mg | Freq: Two times a day (BID) | INTRAVENOUS | Status: DC
  Administered 2019-12-30 – 2020-01-03 (×8): 750 mg via INTRAVENOUS
  Filled 2019-12-30 (×9): qty 150

## 2019-12-30 MED ORDER — SENNOSIDES-DOCUSATE SODIUM 8.6-50 MG PO TABS
1.0000 | ORAL_TABLET | Freq: Two times a day (BID) | ORAL | Status: DC
  Administered 2019-12-30: 1 via ORAL
  Filled 2019-12-30: qty 1

## 2019-12-30 NOTE — ED Notes (Signed)
Lab attempted to collect all 5am Labs but was only able to get a few tubes; RN to send what was able to be collected; Patient refused to be stuck again to get remaining blood work-Monique,RN
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
## 2019-12-30 NOTE — Consult Note (Signed)
WOC Nurse Consult Note: Patient receiving care in [facility] 6N[facility]. Reason for Consult: Left BKA stump, left hip wound Wound type: both are unstageable Pressure Injury POA: Yes/No/NA Measurement: Wound bed: Drainage (amount, consistency, odor)  Periwound: Dressing procedure/placement/frequency: I have sent a SecureChat to Dr. Judie Petit. Arrien asking for a surgical consult for possible surgical debridement.  Topical dressings for these highly necrotic, unstageable wounds would likely prove useless in removing the necrotic tissue and promoting healing.  Dr. Ella Jubilee to pursue as he deems appropriate. Thank you for the consult.  Discussed plan of care with the Attending.  WOC nurse will not follow at this time.  Please re-consult the WOC team if needed.  Helmut Muster, RN, MSN, CWOCN, CNS-BC, pager [phone]
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
## 2019-12-30 NOTE — ED Notes (Signed)
[name] he is a slow bleeder he stop bleeding at the site .asked him could I stick him again to collected the cbc he refused and was very rude.nurse is awear.
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
## 2019-12-30 NOTE — Plan of Care (Signed)
  Problem: Education: Goal: Knowledge of General Education information will improve Description: Including pain rating scale, medication(s)/side effects and non-pharmacologic comfort measures Outcome: Progressing   Problem: Clinical Measurements: Goal: Ability to maintain clinical measurements within normal limits will improve Outcome: Progressing Goal: Will remain free from infection Outcome: Progressing Goal: Diagnostic test results will improve Outcome: Progressing   Problem: Activity: Goal: Risk for activity intolerance will decrease Outcome: Progressing   Problem: Nutrition: Goal: Adequate nutrition will be maintained Outcome: Progressing   Problem: Elimination: Goal: Will not experience complications related to urinary retention Outcome: Progressing   Problem: Skin Integrity: Goal: Risk for impaired skin integrity will decrease Outcome: Not Progressing    It was reported to this nurse that patient had disimpacted self, and that he does so each morning for bowel management.  On assessment, it was noted that patient has poor rectal sphincter control / AEB multiple inc. Loose stools.  He states that at home he utilizes a tampon, which he inserts in his rectum, to "tie up" his bowels until the next day.  He asked me to do this as he was having multiple incontinency episodes.  I told him that this practice was discouraged and the rational behind inserting an object into his rectum could ultimately lead to perforation.  He insisted on staff performing this, but I again declined, stating my reason.    We also discussed his dietary habits.  He is quite thin, with an albumin level of 1.6.  I discussed with him that wound healing requires a great deal of protein stores and re-hydration, which dietary would need to address.  I believe a nutritional consult would be of benefit to him.

## 2019-12-30 NOTE — Progress Notes (Signed)
Pharmacy Antibiotic Note  [name] is a 49 y.o. male admitted on 12/29/2019 with Cellulitis/ concern for osteo.  Pharmacy consulted 12/29/19 for Vancomycin dosing.  Vancomycin 1 gm IV x1 given last night 12/29/19. Vancomycin random level = 6  ~ 12 hr post Vanc 1g dose  SCr 0.72,  weight 57 kg , h/o bilateral BKA  3/9 COVID: neg   Height: 5' (152.4 cm) Weight: 125 lb 14.1 oz (57.1 kg) IBW/kg (Calculated) : 50  Temp (24hrs), Avg:98.8 F (37.1 C), Min:98.8 F (37.1 C), Max:98.8 F (37.1 C)  Recent Labs  Lab 12/29/19 1930 12/30/19 0420 12/30/19 0747 12/30/19 0755  WBC 12.3*  --   --  9.8  CREATININE 0.88 0.72  --   --   VANCORANDOM  --   --  6  --     Estimated Creatinine Clearance: 80.7 mL/min (by C-G formula based on SCr of 0.72 mg/dL).    No Known Allergies  Antimicrobials this admission: 3/9 Ceftriaxonex >>  3/9 Vancomycin >>   Dose adjustments this admission:   Microbiology results: 3/9 COVID-negative   Plan:  Vancomycin 750 mg IV Q 12 hrs. Goal AUC 400-550. Expected AUC: 511 SCr used: 0.8 Monitor clinical status, renal function and culture results daily.  Thank you for allowing pharmacy to be a part of this patient's care.  Noah Delaine, RPh Clinical Pharmacist [phone] Please check AMION for all [facility] Pharmacy phone numbers After 10:00 PM, call Main Pharmacy [phone] 12/30/2019 10:23 AM
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
## 2019-12-31 ENCOUNTER — Telehealth: Payer: Self-pay | Admitting: Physical Medicine & Rehabilitation

## 2019-12-31 ENCOUNTER — Other Ambulatory Visit: Payer: Self-pay | Admitting: Physician Assistant

## 2019-12-31 DIAGNOSIS — E440 Moderate protein-calorie malnutrition: Secondary | ICD-10-CM | POA: Insufficient documentation

## 2019-12-31 LAB — CBC WITH DIFFERENTIAL/PLATELET
Abs Immature Granulocytes: 0.06 10*3/uL (ref 0.00–0.07)
Basophils Absolute: 0 10*3/uL (ref 0.0–0.1)
Basophils Relative: 0 %
Eosinophils Absolute: 0 10*3/uL (ref 0.0–0.5)
Eosinophils Relative: 0 %
HCT: 28.5 % — ABNORMAL LOW (ref 39.0–52.0)
Hemoglobin: 9.6 g/dL — ABNORMAL LOW (ref 13.0–17.0)
Immature Granulocytes: 1 %
Lymphocytes Relative: 19 %
Lymphs Abs: 1.7 10*3/uL (ref 0.7–4.0)
MCH: 31.4 pg (ref 26.0–34.0)
MCHC: 33.7 g/dL (ref 30.0–36.0)
MCV: 93.1 fL (ref 80.0–100.0)
Monocytes Absolute: 1.8 10*3/uL — ABNORMAL HIGH (ref 0.1–1.0)
Monocytes Relative: 21 %
Neutro Abs: 5.2 10*3/uL (ref 1.7–7.7)
Neutrophils Relative %: 59 %
Platelets: 188 10*3/uL (ref 150–400)
RBC: 3.06 MIL/uL — ABNORMAL LOW (ref 4.22–5.81)
RDW: 13.9 % (ref 11.5–15.5)
WBC: 8.8 10*3/uL (ref 4.0–10.5)
nRBC: 0 % (ref 0.0–0.2)

## 2019-12-31 LAB — BASIC METABOLIC PANEL
Anion gap: 11 (ref 5–15)
BUN: 14 mg/dL (ref 6–20)
CO2: 25 mmol/L (ref 22–32)
Calcium: 8.2 mg/dL — ABNORMAL LOW (ref 8.9–10.3)
Chloride: 101 mmol/L (ref 98–111)
Creatinine, Ser: 0.68 mg/dL (ref 0.61–1.24)
GFR calc Af Amer: >60 mL/min (ref >60)
GFR calc non Af Amer: >60 mL/min (ref >60)
Glucose, Bld: 78 mg/dL (ref 70–99)
Potassium: 2.7 mmol/L — CL (ref 3.5–5.1)
Sodium: 137 mmol/L (ref 135–145)

## 2019-12-31 LAB — MRSA PCR SCREENING: MRSA by PCR: NEGATIVE

## 2019-12-31 MED ORDER — POTASSIUM CHLORIDE CRYS ER 20 MEQ PO TBCR: 30.0000 meq | EXTENDED_RELEASE_TABLET | Freq: Two times a day (BID) | ORAL | Status: DC

## 2019-12-31 MED ORDER — ADULT MULTIVITAMIN W/MINERALS CH
1.0000 | ORAL_TABLET | Freq: Every day | ORAL | Status: DC
  Filled 2019-12-31 (×2): qty 1

## 2019-12-31 MED ORDER — POTASSIUM CHLORIDE 10 MEQ/100ML IV SOLN
10.0000 meq | INTRAVENOUS | Status: DC
  Administered 2019-12-31 (×2): 10 meq via INTRAVENOUS
  Filled 2019-12-31 (×2): qty 100

## 2019-12-31 MED ORDER — PRO-STAT SUGAR FREE PO LIQD
30.0000 mL | Freq: Two times a day (BID) | ORAL | Status: DC
  Administered 2019-12-31: 30 mL via ORAL
  Filled 2019-12-31 (×4): qty 30

## 2019-12-31 MED ORDER — POTASSIUM CHLORIDE CRYS ER 20 MEQ PO TBCR
40.0000 meq | EXTENDED_RELEASE_TABLET | ORAL | Status: AC
  Administered 2019-12-31 (×2): 40 meq via ORAL
  Filled 2019-12-31 (×2): qty 2

## 2019-12-31 MED ORDER — JUVEN PO PACK
1.0000 | PACK | Freq: Two times a day (BID) | ORAL | Status: DC
  Administered 2019-12-31 – 2020-01-01 (×3): 1 via ORAL
  Filled 2019-12-31 (×4): qty 1

## 2019-12-31 NOTE — Telephone Encounter (Signed)
Patient called to let us know that he was in hospital.  He did have an appt with Dr. Wynn Banker on 3/16, but we have canceled it.
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
## 2019-12-31 NOTE — Progress Notes (Signed)
Patient alert and awake. Discussed revision of BKA tomorrow  and that Plastic Surgery will be addressing hip. Reviewed surgical risks recovery and expectations.
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
## 2019-12-31 NOTE — Progress Notes (Signed)
Paged [name] Right with critical K+ 2.7

## 2019-12-31 NOTE — Progress Notes (Signed)
CRITICAL VALUE ALERT  Critical Value:  K+2.7  Date & Time Notied: 12/31/19 0420  Provider Notified M. Katherina Right NP [phone]  Orders Received/Actions taken:Potassium IV ordered.
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
## 2020-01-01 ENCOUNTER — Inpatient Hospital Stay (HOSPITAL_COMMUNITY): Payer: Medicare Other | Admitting: Certified Registered"

## 2020-01-01 ENCOUNTER — Encounter (HOSPITAL_COMMUNITY)
Admission: EM | Disposition: A | Discharge status: Home Health | Payer: Self-pay | Source: Home / Self Care | Attending: Internal Medicine

## 2020-01-01 ENCOUNTER — Encounter (HOSPITAL_COMMUNITY): Payer: Self-pay | Admitting: Internal Medicine

## 2020-01-01 DIAGNOSIS — E440 Moderate protein-calorie malnutrition: Secondary | ICD-10-CM

## 2020-01-01 DIAGNOSIS — L89224 Pressure ulcer of left hip, stage 4: Secondary | ICD-10-CM

## 2020-01-01 HISTORY — PX: I & D EXTREMITY: SHX5045

## 2020-01-01 HISTORY — PX: STUMP REVISION: SHX6102

## 2020-01-01 LAB — BASIC METABOLIC PANEL
Anion gap: 10 (ref 5–15)
BUN: 18 mg/dL (ref 6–20)
CO2: 27 mmol/L (ref 22–32)
Calcium: 8.2 mg/dL — ABNORMAL LOW (ref 8.9–10.3)
Chloride: 103 mmol/L (ref 98–111)
Creatinine, Ser: 0.59 mg/dL — ABNORMAL LOW (ref 0.61–1.24)
GFR calc Af Amer: >60 mL/min (ref >60)
GFR calc non Af Amer: >60 mL/min (ref >60)
Glucose, Bld: 80 mg/dL (ref 70–99)
Potassium: 3.8 mmol/L (ref 3.5–5.1)
Sodium: 140 mmol/L (ref 135–145)

## 2020-01-01 LAB — POCT I-STAT 7, (LYTES, BLD GAS, ICA,H+H)
Acid-Base Excess: 6 mmol/L — ABNORMAL HIGH (ref 0.0–2.0)
Bicarbonate: 29.5 mmol/L — ABNORMAL HIGH (ref 20.0–28.0)
Calcium, Ion: 1.17 mmol/L (ref 1.15–1.40)
HCT: 26 % — ABNORMAL LOW (ref 39.0–52.0)
Hemoglobin: 8.8 g/dL — ABNORMAL LOW (ref 13.0–17.0)
O2 Saturation: 100 %
Potassium: 3.3 mmol/L — ABNORMAL LOW (ref 3.5–5.1)
Sodium: 139 mmol/L (ref 135–145)
TCO2: 31 mmol/L (ref 22–32)
pCO2 arterial: 36.5 mmHg (ref 32.0–48.0)
pH, Arterial: 7.516 — ABNORMAL HIGH (ref 7.350–7.450)
pO2, Arterial: 353 mmHg — ABNORMAL HIGH (ref 83.0–108.0)

## 2020-01-01 LAB — CBC WITH DIFFERENTIAL/PLATELET
Abs Immature Granulocytes: 0.06 10*3/uL (ref 0.00–0.07)
Basophils Absolute: 0 10*3/uL (ref 0.0–0.1)
Basophils Relative: 1 %
Eosinophils Absolute: 0.1 10*3/uL (ref 0.0–0.5)
Eosinophils Relative: 2 %
HCT: 28.5 % — ABNORMAL LOW (ref 39.0–52.0)
Hemoglobin: 9.2 g/dL — ABNORMAL LOW (ref 13.0–17.0)
Immature Granulocytes: 1 %
Lymphocytes Relative: 24 %
Lymphs Abs: 1.6 10*3/uL (ref 0.7–4.0)
MCH: 30.8 pg (ref 26.0–34.0)
MCHC: 32.3 g/dL (ref 30.0–36.0)
MCV: 95.3 fL (ref 80.0–100.0)
Monocytes Absolute: 1.2 10*3/uL — ABNORMAL HIGH (ref 0.1–1.0)
Monocytes Relative: 18 %
Neutro Abs: 3.7 10*3/uL (ref 1.7–7.7)
Neutrophils Relative %: 54 %
Platelets: 206 10*3/uL (ref 150–400)
RBC: 2.99 MIL/uL — ABNORMAL LOW (ref 4.22–5.81)
RDW: 14.2 % (ref 11.5–15.5)
WBC: 6.7 10*3/uL (ref 4.0–10.5)
nRBC: 0 % (ref 0.0–0.2)

## 2020-01-01 LAB — PREPARE RBC (CROSSMATCH)

## 2020-01-01 SURGERY — REVISION, AMPUTATION SITE
Anesthesia: General | Laterality: Left

## 2020-01-01 MED ORDER — ENSURE PRE-SURGERY PO LIQD
296.0000 mL | Freq: Once | ORAL | Status: AC
  Administered 2020-01-01: 296 mL via ORAL
  Filled 2020-01-01: qty 296

## 2020-01-01 MED ORDER — PROPOFOL 10 MG/ML IV BOLUS
INTRAVENOUS | Status: DC | PRN
  Administered 2020-01-01: 150 mg via INTRAVENOUS

## 2020-01-01 MED ORDER — ONDANSETRON HCL 4 MG/2ML IJ SOLN
INTRAMUSCULAR | Status: AC
  Filled 2020-01-01: qty 2

## 2020-01-01 MED ORDER — PHENYLEPHRINE 40 MCG/ML (10ML) SYRINGE FOR IV PUSH (FOR BLOOD PRESSURE SUPPORT)
PREFILLED_SYRINGE | INTRAVENOUS | Status: DC | PRN
  Administered 2020-01-01: 40 ug via INTRAVENOUS
  Administered 2020-01-01: 120 ug via INTRAVENOUS

## 2020-01-01 MED ORDER — LACTATED RINGERS IV SOLN: INTRAVENOUS | Status: DC | PRN

## 2020-01-01 MED ORDER — OXYCODONE HCL 5 MG PO TABS: 5.0000 mg | ORAL_TABLET | ORAL | Status: DC | PRN

## 2020-01-01 MED ORDER — SODIUM CHLORIDE 0.9 % IV SOLN: INTRAVENOUS | Status: DC

## 2020-01-01 MED ORDER — DEXAMETHASONE SODIUM PHOSPHATE 4 MG/ML IJ SOLN
INTRAMUSCULAR | Status: DC | PRN
  Administered 2020-01-01: 10 mg via INTRAVENOUS

## 2020-01-01 MED ORDER — FENTANYL CITRATE (PF) 100 MCG/2ML IJ SOLN
INTRAMUSCULAR | Status: DC | PRN
  Administered 2020-01-01: 50 ug via INTRAVENOUS

## 2020-01-01 MED ORDER — CEFAZOLIN SODIUM-DEXTROSE 2-4 GM/100ML-% IV SOLN
2.0000 g | INTRAVENOUS | Status: AC
  Administered 2020-01-01: 2 g via INTRAVENOUS
  Filled 2020-01-01: qty 100

## 2020-01-01 MED ORDER — PHENYLEPHRINE HCL-NACL 10-0.9 MG/250ML-% IV SOLN
INTRAVENOUS | Status: DC | PRN
  Administered 2020-01-01: 30 ug/min via INTRAVENOUS

## 2020-01-01 MED ORDER — SODIUM CHLORIDE 0.9 % IR SOLN
Status: DC | PRN
  Administered 2020-01-01: 3000 mL
  Administered 2020-01-01: 1000 mL

## 2020-01-01 MED ORDER — MIDAZOLAM HCL 2 MG/2ML IJ SOLN
INTRAMUSCULAR | Status: AC
  Filled 2020-01-01: qty 2

## 2020-01-01 MED ORDER — CHLORHEXIDINE GLUCONATE 4 % EX LIQD
60.0000 mL | Freq: Once | CUTANEOUS | Status: AC
  Administered 2020-01-01: 4 via TOPICAL
  Filled 2020-01-01: qty 60

## 2020-01-01 MED ORDER — LIDOCAINE HCL (CARDIAC) PF 100 MG/5ML IV SOSY
PREFILLED_SYRINGE | INTRAVENOUS | Status: DC | PRN
  Administered 2020-01-01: 60 mg via INTRAVENOUS

## 2020-01-01 MED ORDER — DEXAMETHASONE SODIUM PHOSPHATE 10 MG/ML IJ SOLN
INTRAMUSCULAR | Status: AC
  Filled 2020-01-01: qty 1

## 2020-01-01 MED ORDER — CEFAZOLIN SODIUM-DEXTROSE 2-4 GM/100ML-% IV SOLN: 2.0000 g | INTRAVENOUS | Status: DC

## 2020-01-01 MED ORDER — LACTATED RINGERS IV SOLN: INTRAVENOUS | Status: DC

## 2020-01-01 MED ORDER — ROCURONIUM BROMIDE 100 MG/10ML IV SOLN
INTRAVENOUS | Status: DC | PRN
  Administered 2020-01-01: 70 mg via INTRAVENOUS

## 2020-01-01 MED ORDER — FENTANYL CITRATE (PF) 100 MCG/2ML IJ SOLN: 25.0000 ug | INTRAMUSCULAR | Status: DC | PRN

## 2020-01-01 MED ORDER — SODIUM CHLORIDE 0.9% IV SOLUTION: Freq: Once | INTRAVENOUS | Status: DC

## 2020-01-01 MED ORDER — DOCUSATE SODIUM 100 MG PO CAPS
100.0000 mg | ORAL_CAPSULE | Freq: Two times a day (BID) | ORAL | Status: DC
  Filled 2020-01-01 (×3): qty 1

## 2020-01-01 MED ORDER — SUGAMMADEX SODIUM 200 MG/2ML IV SOLN
INTRAVENOUS | Status: DC | PRN
  Administered 2020-01-01: 150 mg via INTRAVENOUS

## 2020-01-01 MED ORDER — HYDROMORPHONE HCL 1 MG/ML IJ SOLN: 0.5000 mg | INTRAMUSCULAR | Status: DC | PRN

## 2020-01-01 MED ORDER — PROPOFOL 10 MG/ML IV BOLUS
INTRAVENOUS | Status: AC
  Filled 2020-01-01: qty 40

## 2020-01-01 MED ORDER — POTASSIUM CHLORIDE CRYS ER 20 MEQ PO TBCR
40.0000 meq | EXTENDED_RELEASE_TABLET | ORAL | Status: AC
  Administered 2020-01-01 (×2): 40 meq via ORAL
  Filled 2020-01-01 (×2): qty 2

## 2020-01-01 MED ORDER — FENTANYL CITRATE (PF) 250 MCG/5ML IJ SOLN
INTRAMUSCULAR | Status: AC
  Filled 2020-01-01: qty 5

## 2020-01-01 SURGICAL SUPPLY — 72 items
BLADE SAW RECIP 87.9 MT (BLADE) ×2 IMPLANT
BLADE SURG 21 STRL SS (BLADE) ×3 IMPLANT
BNDG CONFORM 2 STRL LF (GAUZE/BANDAGES/DRESSINGS) IMPLANT
BNDG ELASTIC 4X5.8 VLCR STR LF (GAUZE/BANDAGES/DRESSINGS) ×3 IMPLANT
BNDG GAUZE ELAST 4 BULKY (GAUZE/BANDAGES/DRESSINGS) IMPLANT
CANISTER WOUND CARE 500ML ATS (WOUND CARE) ×3 IMPLANT
CANISTER WOUNDNEG PRESSURE 500 (CANNISTER) ×4 IMPLANT
CORD BIPOLAR FORCEPS 12FT (ELECTRODE) IMPLANT
COVER SURGICAL LIGHT HANDLE (MISCELLANEOUS) ×6 IMPLANT
COVER WAND RF STERILE (DRAPES) ×6 IMPLANT
CUFF TOURN SGL QUICK 18X4 (TOURNIQUET CUFF) IMPLANT
CUFF TOURN SGL QUICK 24 (TOURNIQUET CUFF)
CUFF TRNQT CYL 24X4X16.5-23 (TOURNIQUET CUFF) IMPLANT
DRAPE EXTREMITY T 121X128X90 (DISPOSABLE) ×3 IMPLANT
DRAPE HALF SHEET 40X57 (DRAPES) ×3 IMPLANT
DRAPE INCISE IOBAN 66X45 STRL (DRAPES) ×3 IMPLANT
DRAPE ORTHO SPLIT 77X108 STRL (DRAPES) ×3
DRAPE SURG ORHT 6 SPLT 77X108 (DRAPES) IMPLANT
DRAPE U-SHAPE 47X51 STRL (DRAPES) ×6 IMPLANT
DRESSING PREVENA PLUS CUSTOM (GAUZE/BANDAGES/DRESSINGS) ×1 IMPLANT
DRSG ADAPTIC 3X8 NADH LF (GAUZE/BANDAGES/DRESSINGS) IMPLANT
DRSG MEPITEL 4X7.2 (GAUZE/BANDAGES/DRESSINGS) ×2 IMPLANT
DRSG PREVENA PLUS CUSTOM (GAUZE/BANDAGES/DRESSINGS) ×6
DRSG VAC ATS SM SENSATRAC (GAUZE/BANDAGES/DRESSINGS) ×4 IMPLANT
DURAPREP 26ML APPLICATOR (WOUND CARE) ×3 IMPLANT
ELECT REM PT RETURN 9FT ADLT (ELECTROSURGICAL) ×3
ELECTRODE REM PT RTRN 9FT ADLT (ELECTROSURGICAL) ×1 IMPLANT
GAUZE SPONGE 4X4 12PLY STRL (GAUZE/BANDAGES/DRESSINGS) ×3 IMPLANT
GAUZE XEROFORM 1X8 LF (GAUZE/BANDAGES/DRESSINGS) ×3 IMPLANT
GLOVE BIO SURGEON STRL SZ8 (GLOVE) ×3 IMPLANT
GLOVE BIOGEL M STRL SZ7.5 (GLOVE) ×7 IMPLANT
GLOVE BIOGEL PI IND STRL 8 (GLOVE) IMPLANT
GLOVE BIOGEL PI IND STRL 9 (GLOVE) ×1 IMPLANT
GLOVE BIOGEL PI INDICATOR 8 (GLOVE) ×4
GLOVE BIOGEL PI INDICATOR 9 (GLOVE) ×2
GLOVE SURG ORTHO 9.0 STRL STRW (GLOVE) ×3 IMPLANT
GOWN STRL REUS W/ TWL LRG LVL3 (GOWN DISPOSABLE) ×2 IMPLANT
GOWN STRL REUS W/ TWL XL LVL3 (GOWN DISPOSABLE) ×2 IMPLANT
GOWN STRL REUS W/TWL LRG LVL3 (GOWN DISPOSABLE) ×6
GOWN STRL REUS W/TWL XL LVL3 (GOWN DISPOSABLE) ×6
GRAFT MESHED PRIMATRIX 10X12 (Graft) ×2 IMPLANT
KIT BASIN OR (CUSTOM PROCEDURE TRAY) ×6 IMPLANT
KIT TURNOVER KIT B (KITS) ×6 IMPLANT
MANIFOLD NEPTUNE II (INSTRUMENTS) ×6 IMPLANT
NDL HYPO 25GX1X1/2 BEV (NEEDLE) IMPLANT
NEEDLE HYPO 25GX1X1/2 BEV (NEEDLE) IMPLANT
NS IRRIG 1000ML POUR BTL (IV SOLUTION) ×6 IMPLANT
PACK GENERAL/GYN (CUSTOM PROCEDURE TRAY) ×3 IMPLANT
PACK ORTHO EXTREMITY (CUSTOM PROCEDURE TRAY) ×3 IMPLANT
PAD ABD 8X10 STRL (GAUZE/BANDAGES/DRESSINGS) IMPLANT
PAD ARMBOARD 7.5X6 YLW CONV (MISCELLANEOUS) ×6 IMPLANT
PAD CAST 4YDX4 CTTN HI CHSV (CAST SUPPLIES) IMPLANT
PADDING CAST COTTON 4X4 STRL (CAST SUPPLIES)
PREVENA RESTOR ARTHOFORM 46X30 (CANNISTER) ×3 IMPLANT
SET CYSTO W/LG BORE CLAMP LF (SET/KITS/TRAYS/PACK) IMPLANT
SET INTERPULSE LAVAGE W/TIP (ORTHOPEDIC DISPOSABLE SUPPLIES) ×2 IMPLANT
SOL PREP POV-IOD 4OZ 10% (MISCELLANEOUS) ×6 IMPLANT
SPONGE LAP 4X18 RFD (DISPOSABLE) ×3 IMPLANT
STAPLER VISISTAT 35W (STAPLE) IMPLANT
SUT ETHILON 2 0 PSLX (SUTURE) ×6 IMPLANT
SUT SILK 2 0 (SUTURE)
SUT SILK 2-0 18XBRD TIE 12 (SUTURE) IMPLANT
SUT VIC AB 4-0 PS2 18 (SUTURE) IMPLANT
SWAB CULTURE ESWAB REG 1ML (MISCELLANEOUS) IMPLANT
SYR CONTROL 10ML LL (SYRINGE) IMPLANT
TOWEL GREEN STERILE (TOWEL DISPOSABLE) ×6 IMPLANT
TOWEL GREEN STERILE FF (TOWEL DISPOSABLE) ×3 IMPLANT
TUBE CONNECTING 12'X1/4 (SUCTIONS) ×1
TUBE CONNECTING 12X1/4 (SUCTIONS) ×2 IMPLANT
TUBE SUCT ARGYLE STRL (TUBING) ×2 IMPLANT
WATER STERILE IRR 1000ML POUR (IV SOLUTION) ×3 IMPLANT
YANKAUER SUCT BULB TIP NO VENT (SUCTIONS) ×5 IMPLANT

## 2020-01-01 NOTE — Op Note (Signed)
Operative Note   DATE OF OPERATION: 01/01/2020  SURGICAL DEPARTMENT: Plastic Surgery  PREOPERATIVE DIAGNOSES: Left trochanteric pressure ulcer  POSTOPERATIVE DIAGNOSES:  same  PROCEDURE: 1.  Debridement of left trochanteric pressure ulcer 9 x 9 x 3.5 cm.  This was an excisional debridement using the pulse lavage irrigator. 2.  Placement of prime matrix AG to left pressure ulcer 9 x 9 x 3.5 cm 3.  Placement of wound VAC to left trochanteric pressure ulcer wound 9 x 9 x 3.5 cm  SURGEON: Ancil Linsey, MD  ASSISTANT: Zadie Cleverly, PA The advanced practice practitioner (APP) assisted throughout the case.  The APP was essential in retraction and counter traction when needed to make the case progress smoothly.  This retraction and assistance made it possible to see the tissue plans for the procedure.  The assistance was needed for blood control, tissue re-approximation and assisted with closure of the incision site.  ANESTHESIA:  General.   COMPLICATIONS: None.   INDICATIONS FOR PROCEDURE:  The patient, [name] is a 49 y.o. male born on 18-Nov-1970, is here for treatment of left trochanteric pressure ulcer MRN: [id number]  CONSENT:  Informed consent was obtained directly from the patient. Risks, benefits and alternatives were fully discussed. Specific risks including but not limited to bleeding, infection, hematoma, seroma, scarring, pain, contracture, asymmetry, wound healing problems, and need for further surgery were all discussed. The patient did have an ample opportunity to have questions answered to satisfaction.   DESCRIPTION OF PROCEDURE:  The patient was taken to the operating room. SCDs were placed and Ancef antibiotics were given.  General anesthesia was administered.  The patient's operative site was prepped and draped in a sterile fashion. A time out was performed and all information was confirmed to be correct.  First the wound was explored.  There was obviously  necrotic tissue around the edges and at the wound base.  An excisional debridement was performed.  The necrotic tissue was excised with pickups scissors, and a curette was used for the surrounding area that was undermined.  This got back to bleeding healthy tissue and ended up going all the way down to the greater trochanter which appeared healthy.  Minimal purulence was encountered.  A tissue culture was sent.  At this point pulse lavage irrigation was used on the healthy wound surface.  Hemostasis was obtained with cautery.  Meshed prime matrix AG sheet was brought onto the field and rinsed in saline.  It was then secured in place at the base of the wound and along the edges with 4-0 Vicryl suture.  A small black wound VAC sponge was then brought onto the field and Mepitel was placed along the bottom edge.  This was inserted into the wound and fit nicely.  The VAC was then set up and a good seal was obtained.  The patient tolerated the procedure well.  There were no complications. The patient was allowed to wake from anesthesia, extubated and taken to the recovery room in satisfactory condition.

## 2020-01-01 NOTE — Interval H&P Note (Signed)
History and Physical Interval Note:  01/01/2020 6:53 AM  [name]  has presented today for surgery, with the diagnosis of Gangrene Left Below Knee Amputation.  The various methods of treatment have been discussed with the patient and family. After consideration of risks, benefits and other options for treatment, the patient has consented to  Procedure(s): REVISION LEFT BELOW KNEE AMPUTATION (Left) DEBRIDEMENT OF LEFT HIP WOUND WITH PLACEMENT OF PRIMATRIX A G WITH WOUND VAC (Left) as a surgical intervention.  The patient's history has been reviewed, patient examined, no change in status, stable for surgery.  I have reviewed the patient's chart and labs.  Questions were answered to the patient's satisfaction.     [name]

## 2020-01-01 NOTE — Interval H&P Note (Signed)
Pt seen and examined. Agree with plan for surgery today. Risks and benefits discussed.

## 2020-01-01 NOTE — Progress Notes (Signed)
PROGRESS NOTE    Rashi Giuliani  BPZ:[id number] DOB: 1970-10-29 DOA: 12/29/2019 PCP: Patient, No Pcp Per    Brief Narrative:  Patient was admitted to the hospital with left stump deep unable to stage wound/left hip stage III pressure ulcer infection/cellulitis.  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 Patient is status post bilateral BKA.  Apparently patient sustained a traumatic injury to his left lower extremity and declined to be brought to the hospital. Over the following weeks he had worsening wound appearance and his family finally convinced him to seek medical attention. On his initial physical examination blood pressure 172/82, heart rate 77, respiratory rate 18, oxygen saturation 90%, he had moist mucous membranes, lungs were clear to auscultation bilaterally, heart S1-2 present rhythm, soft abdomen, no lower extremity edema. Patient had a large unstageable ulcer left stump.Bilateral BKA. Positive left hip stage 3 pressure ulcer with purulent drainage.  Started on broad spectrum antibiotic therapy with vancomycin and ceftriaxone, with no leukocytosis or fever. Patient hemodynamically stable.   Patient evaluated by orthopedic/ plastics surgery, plan for wounds revision on 01/01/20.    Assessment & Plan:   Principal Problem:   Wound infection Active Problems:   Seizures ([place])   Quadriplegia and quadriparesis ([facility])   Neurogenic bladder   Decubitus ulcer of left hip, stage 4 ([facility])   Dehiscence of amputation stump (HCC)   Malnutrition of moderate degree   1. Left stump full skin thickness unable to stage and left hip stage 3 pressure ulcers/ present on admission. Wbc at 6,7, wound pain continue well controlled with analgesics. Tolerating well antibiotic therapyVancomycin and ceftriaxone. Continue  nutritional supplements.    For surgical intervention today.   2. Seizure disorder.  Clinically continue to be well controlled,  continue with depakote per his home regimen.   3. Quadriplegia. Continue with fall and wound precautions.   4. Moderate calorie protein malnutrition. On nutritional supplements, follow nutrition recommendations.   5. Hypokalemia. Improved K up to 3,8 with preserved renal function with serum cr at 0.59. Follow K is 3,3, will check Mg in am. Continue K correction with Kcl 40 meq x2.   DVT prophylaxis:enoxaparin Code Status:full Family Communication:I spoke with patient's mother at the bedside, we talked in detail about patient's condition, plan of care and prognosis and all questions were addressed.  Disposition Plan/ discharge barriers:patient from home, barrier for discharge, active wound infections, needing advance care, surgical intervention and IV antibiotic therapy.   Nutrition Status: Nutrition Problem: Moderate Malnutrition Etiology: chronic illness(TBI with spinal cord injury) Signs/Symptoms: energy intake < or equal to 75% for > or equal to 1 month, percent weight loss, mild fat depletion, moderate fat depletion, mild muscle depletion, moderate muscle depletion Percent weight loss: 10 % Interventions: MVI, Juven, Magic cup, Prostat, Refer to RD note for recommendations     Skin Documentation:   Consultants:   Orthopedics  Plastic surgery   Procedures:   Wound revision   Antimicrobials:   Vancomycin   Ceftriaxone     Subjective: Patient with no nausea or vomiting, no chest pain or dyspnea, no significant pain at the wounds site.   Objective: Vitals:   12/31/19 0555 12/31/19 1407 12/31/19 2046 01/01/20 0556  BP: 99/62 99/67 103/62 110/74  Pulse: 99 89 94 81  Resp: 18 18 18 18   Temp: 98.4 F (36.9 C) 98.7 F (37.1 C) 99.3 F (  37.4 C) 98.4 F (36.9 C)  TempSrc: Oral Oral Oral Oral  SpO2: 98% 98% 100% 100%   Weight:      Height:        Intake/Output Summary (Last 24 hours) at 01/01/2020 0809 Last data filed at 01/01/2020 0600 Gross per 24 hour  Intake 880 ml  Output 1200 ml  Net -320 ml   Filed Weights   12/30/19 0500  Weight: 57.1 kg    Examination:   General: Not in pain or dyspnea.  Neurology: Awake and alert, non focal  E ENT: no pallor, no icterus, oral mucosa moist Cardiovascular: No JVD. S1-S2 present, rhythmic, no gallops, rubs, or murmurs. No lower extremity edema. Pulmonary: positive breath sounds bilaterally, adequate air movement, no wheezing, rhonchi or rales. Gastrointestinal. Abdomen with, no organomegaly, non tender, no rebound or guarding Skin. Left stump wound and left hip pressure ulcer with dressing in place.  Musculoskeletal: bilateral BKA Bladder catheter.     Data Reviewed: I have personally reviewed following labs and imaging studies  CBC: Recent Labs  Lab 12/29/19 1930 12/30/19 0755 12/31/19 0303 01/01/20 0217  WBC 12.3* 9.8 8.8 6.7  NEUTROABS 8.7*  --  5.2 3.7  HGB 9.8* 9.3* 9.6* 9.2*  HCT 29.9* 28.0* 28.5* 28.5*  MCV 96.5 94.0 93.1 95.3  PLT 175 191 188 206   Basic Metabolic Panel: Recent Labs  Lab 12/29/19 1930 12/30/19 0420 12/31/19 0303 01/01/20 0217  NA 135 137 137 140  K 4.1 3.4* 2.7* 3.8  CL 101 100 101 103  CO2 25 25 25 27   GLUCOSE 79 77 78 80  BUN 17 15 14 18   CREATININE 0.88 0.72 0.68 0.59*  CALCIUM 7.7* 8.1* 8.2* 8.2*   GFR: Estimated Creatinine Clearance: 80.7 mL/min (A) (by C-G formula based on SCr of 0.59 mg/dL (L)). Liver Function Tests: Recent Labs  Lab 12/29/19 1930  AST 18  ALT 11  ALKPHOS 60  BILITOT 0.9  PROT 6.0*  ALBUMIN 1.6*   No results for input(s): LIPASE, AMYLASE in the last 168 hours. No results for input(s): AMMONIA in the last 168 hours. Coagulation Profile: No results for input(s): INR, PROTIME in the last 168 hours. Cardiac Enzymes: No results for input(s): CKTOTAL, CKMB, CKMBINDEX,  TROPONINI in the last 168 hours. BNP (last 3 results) No results for input(s): PROBNP in the last 8760 hours. HbA1C: No results for input(s): HGBA1C in the last 72 hours. CBG: No results for input(s): GLUCAP in the last 168 hours. Lipid Profile: No results for input(s): CHOL, HDL, LDLCALC, TRIG, CHOLHDL, LDLDIRECT in the last 72 hours. Thyroid Function Tests: No results for input(s): TSH, T4TOTAL, FREET4, T3FREE, THYROIDAB in the last 72 hours. Anemia Panel: No results for input(s): VITAMINB12, FOLATE, FERRITIN, TIBC, IRON, RETICCTPCT in the last 72 hours.    Radiology Studies: I have reviewed all of the imaging during this hospital visit personally     Scheduled Meds: . divalproex  500 mg Oral TID  . feeding supplement (PRO-STAT SUGAR FREE 64)  30 mL Oral BID  . multivitamin with minerals  1 tablet Oral Daily  . nutrition supplement (JUVEN)  1 packet Oral BID BM   Continuous Infusions: . sodium chloride 1,000 mL (12/29/19 2240)  .  ceFAZolin (ANCEF) IV    . cefTRIAXone (ROCEPHIN)  IV 1 g (12/31/19 2141)  . vancomycin 750 mg (01/01/20 0022)     LOS: 3 days        Davonte Siebenaler [id number], MD

## 2020-01-01 NOTE — Care Management (Signed)
French Ana with [facility] has started home [facility] authorization. Ronny Flurry RN

## 2020-01-01 NOTE — Progress Notes (Signed)
Patient does not need blood transfusion at this time. RN confirmed with  M. Scheeler and Dr. Lajoyce Corners. Discontinue order for transfusion.

## 2020-01-01 NOTE — Anesthesia Procedure Notes (Signed)
Arterial Line Insertion Start/End3/09/2020 11:50 AM, 01/01/2020 11:55 AM Performed by: Elmer Picker, MD, anesthesiologist  Preanesthetic checklist: patient identified, IV checked, site marked, risks and benefits discussed, surgical consent, monitors and equipment checked, pre-op evaluation, timeout performed and anesthesia consent Left, radial was placed Catheter size: 20 G Hand hygiene performed  and maximum sterile barriers used  Allen's test indicative of satisfactory collateral circulation Attempts: 1 Procedure performed without using ultrasound guided technique. Following insertion, dressing applied and Biopatch. Post procedure assessment: normal  Patient tolerated the procedure well with no immediate complications.

## 2020-01-01 NOTE — Plan of Care (Signed)

## 2020-01-01 NOTE — Op Note (Signed)
01/01/2020  2:34 PM  PATIENT:  [name]    PRE-OPERATIVE DIAGNOSIS:  Gangrene Left Below Knee Amputation  POST-OPERATIVE DIAGNOSIS:  Same  PROCEDURE:  REVISION LEFT BELOW KNEE AMPUTATION Application Prevena wound VAC with customizable Merton Border form dressing  SURGEON:  [name], MD  PHYSICIAN ASSISTANT:None ANESTHESIA:   General  PREOPERATIVE INDICATIONS:  [name] is a  49 y.o. male with a diagnosis of Gangrene Left Below Knee Amputation who failed conservative measures and elected for surgical management.    The risks benefits and alternatives were discussed with the patient preoperatively including but not limited to the risks of infection, bleeding, nerve injury, cardiopulmonary complications, the need for revision surgery, among others, and the patient was willing to proceed.  OPERATIVE IMPLANTS: Praveena wound VAC with Merton Border form and customizable dressing  @ENCIMAGES @  OPERATIVE FINDINGS: Good bleeding muscle with good color and contractility.  OPERATIVE PROCEDURE: Patient was brought the operating room and underwent a general anesthetic.  After adequate levels anesthesia were obtained patient's left lower extremity was prepped using DuraPrep draped into a sterile field a timeout was called.  Patient had a large necrotic ulcer stage IV decubitus ulcer of the left hip with exposed greater trochanter bone and this was draped out of the sterile field and this was to proceed with plastic surgery for treatment of this ulcer after amputation revision surgery was completed.  Patient is a C7 quadriplegic and has a gangrenous ulcer on the left transtibial amputation of the stage IV ulcer over the greater trochanter left hip with exposed bone.  A fishmouth incision was made proximal to the gangrenous necrotic ulcer.  This was carried sharply down to bone and the distal 2 cm of the tibia and fibula were resected.  The muscle had good color contractility and  consistency.  All necrotic tissue was excised.  The wound was irrigated with normal saline the deep and superficial fascia layers were closed using 2-0 nylon.  A Praveena customizable and form wound VAC dressing were applied this had a good suction fit patient was then transition to the care of plastic surgery for treatment for the left hip decubitus ulcer.   DISCHARGE PLANNING:  Antibiotic duration: Continue IV antibiotics as per recommendations from plastic surgery no further antibiotics necessary for the transtibial amputation  Weightbearing: Not applicable  Pain medication: Not applicable  Dressing care/ Wound VAC: Continue wound VAC for 1 week  Ambulatory devices: Not applicable  Discharge to: Anticipate discharge to home patient has been independent.  Follow-up: In the office 1 week post operative.

## 2020-01-01 NOTE — Anesthesia Postprocedure Evaluation (Signed)
Anesthesia Post Note  Patient: [name]  Procedure(s) Performed: REVISION LEFT BELOW KNEE AMPUTATION (Left ) DEBRIDEMENT OF LEFT HIP WOUND WITH PLACEMENT OF PRIMATRIX A G WITH WOUND VAC (Left )     Patient location during evaluation: PACU Anesthesia Type: General Level of consciousness: awake and alert Pain management: pain level controlled Vital Signs Assessment: post-procedure vital signs reviewed and stable Respiratory status: spontaneous breathing, nonlabored ventilation and respiratory function stable Cardiovascular status: blood pressure returned to baseline and stable Postop Assessment: no apparent nausea or vomiting Anesthetic complications: no    Last Vitals:  Vitals:   01/01/20 1413 01/01/20 1419  BP: (!) 88/61 92/64  Pulse: 70 61  Resp: 11 17  Temp:    SpO2: 99% 100%    Last Pain:  Vitals:   01/01/20 1406  TempSrc:   PainSc: 0-No pain                 Vandora Jaskulski A.

## 2020-01-01 NOTE — Plan of Care (Signed)
  Problem: Education: Goal: Knowledge of General Education information will improve Description Including pain rating scale, medication(s)/side effects and non-pharmacologic comfort measures Outcome: Progressing   

## 2020-01-01 NOTE — Transfer of Care (Signed)
Immediate Anesthesia Transfer of Care Note  Patient: [name]  Procedure(s) Performed: REVISION LEFT BELOW KNEE AMPUTATION (Left ) DEBRIDEMENT OF LEFT HIP WOUND WITH PLACEMENT OF PRIMATRIX A G WITH WOUND VAC (Left )  Patient Location: PACU  Anesthesia Type:General  Level of Consciousness: awake, alert  and oriented  Airway & Oxygen Therapy: Patient Spontanous Breathing  Post-op Assessment: Report given to RN and Post -op Vital signs reviewed and stable  Post vital signs: Reviewed and stable  Last Vitals:  Vitals Value Taken Time  BP 99/68 01/01/20 1334  Temp    Pulse 74 01/01/20 1339  Resp 15 01/01/20 1339  SpO2 100 % 01/01/20 1339  Vitals shown include unvalidated device data.  Last Pain:  Vitals:   01/01/20 1018  TempSrc: Oral  PainSc:          Complications: No apparent anesthesia complications

## 2020-01-01 NOTE — Progress Notes (Signed)
Patient is a 49 year old male status post debridement of left greater trochanteric pressure ulcer with Dr. Arita Miss.  Meshed Primatrix in place. Mepitel dressing placed over primatrix, wound VAC in place.  Good seal noted.   Plan to leave wound VAC in place for 1 week, change on 01/08/2020. WOC consult placed for assistance with VAC change.  Would like to be present during change.   After first change on 01/08/2020, continue with twice weekly wound VAC changes.  [facility] home health VAC ordered for when pt is d/c.  Tissue culture sent, will continue to follow. Call with any questions or concerns  Keenan Bachelor, PA-C [facility] [phone]

## 2020-01-01 NOTE — Anesthesia Preprocedure Evaluation (Addendum)
Anesthesia Evaluation  Patient identified by MRN, date of birth, ID band Patient awake    Reviewed: Allergy & Precautions, NPO status , Patient's Chart, lab work & pertinent test results  Airway Mallampati: II  TM Distance: >3 FB Neck ROM: Full    Dental no notable dental hx. (+) Teeth Intact   Pulmonary neg pulmonary ROS, Current Smoker,    Pulmonary exam normal breath sounds clear to auscultation       Cardiovascular negative cardio ROS Normal cardiovascular exam Rhythm:Regular Rate:Normal     Neuro/Psych Seizures - (last seizire prior to arrival this hospital admission, on depakote), Well Controlled,  C7 spinal cord injury c/b spastic quadriparesis and neurogenic bladder negative psych ROS   GI/Hepatic negative GI ROS, Neg liver ROS,   Endo/Other  negative endocrine ROS  Renal/GU negative Renal ROS   Neurogenic bladder, self catheterizes negative genitourinary   Musculoskeletal negative musculoskeletal ROS (+)   Abdominal   Peds  Hematology  (+) Blood dyscrasia (Hgb 9,2), anemia ,   Anesthesia Other Findings   Reproductive/Obstetrics                          Anesthesia Physical Anesthesia Plan  ASA: III  Anesthesia Plan: General   Post-op Pain Management:    Induction: Intravenous  PONV Risk Score and Plan: 1 and Ondansetron, Dexamethasone and Midazolam  Airway Management Planned: Oral ETT  Additional Equipment:   Intra-op Plan:   Post-operative Plan: Extubation in OR  Informed Consent: I have reviewed the patients History and Physical, chart, labs and discussed the procedure including the risks, benefits and alternatives for the proposed anesthesia with the patient or authorized representative who has indicated his/her understanding and acceptance.     Dental advisory given  Plan Discussed with: CRNA  Anesthesia Plan Comments:        Anesthesia Quick  Evaluation

## 2020-01-01 NOTE — Anesthesia Procedure Notes (Signed)
Procedure Name: Intubation Date/Time: 01/01/2020 11:31 AM Performed by: Griffin Dakin, CRNA Pre-anesthesia Checklist: Patient identified, Emergency Drugs available, Suction available and Patient being monitored Patient Re-evaluated:Patient Re-evaluated prior to induction Oxygen Delivery Method: Circle system utilized Preoxygenation: Pre-oxygenation with 100% oxygen Induction Type: IV induction Ventilation: Mask ventilation without difficulty and Oral airway inserted - appropriate to patient size Laryngoscope Size: Mac and 3 Grade View: Grade I Tube type: Oral Tube size: 7.5 mm Number of attempts: 1 Airway Equipment and Method: Stylet and Oral airway Placement Confirmation: ETT inserted through vocal cords under direct vision,  positive ETCO2 and breath sounds checked- equal and bilateral Secured at: 23 cm Tube secured with: Tape Dental Injury: Teeth and Oropharynx as per pre-operative assessment
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
## 2020-01-02 LAB — MAGNESIUM: Magnesium: 1.6 mg/dL — ABNORMAL LOW (ref 1.7–2.4)

## 2020-01-02 LAB — BASIC METABOLIC PANEL
Anion gap: 8 (ref 5–15)
BUN: 21 mg/dL — ABNORMAL HIGH (ref 6–20)
CO2: 25 mmol/L (ref 22–32)
Calcium: 8 mg/dL — ABNORMAL LOW (ref 8.9–10.3)
Chloride: 107 mmol/L (ref 98–111)
Creatinine, Ser: 0.68 mg/dL (ref 0.61–1.24)
GFR calc Af Amer: >60 mL/min (ref >60)
GFR calc non Af Amer: >60 mL/min (ref >60)
Glucose, Bld: 124 mg/dL — ABNORMAL HIGH (ref 70–99)
Potassium: 4.9 mmol/L (ref 3.5–5.1)
Sodium: 140 mmol/L (ref 135–145)

## 2020-01-02 LAB — CBC WITH DIFFERENTIAL/PLATELET
Abs Immature Granulocytes: 0.08 10*3/uL — ABNORMAL HIGH (ref 0.00–0.07)
Basophils Absolute: 0 10*3/uL (ref 0.0–0.1)
Basophils Relative: 0 %
Eosinophils Absolute: 0 10*3/uL (ref 0.0–0.5)
Eosinophils Relative: 0 %
HCT: 26.1 % — ABNORMAL LOW (ref 39.0–52.0)
Hemoglobin: 8.5 g/dL — ABNORMAL LOW (ref 13.0–17.0)
Immature Granulocytes: 1 %
Lymphocytes Relative: 13 %
Lymphs Abs: 1 10*3/uL (ref 0.7–4.0)
MCH: 31.5 pg (ref 26.0–34.0)
MCHC: 32.6 g/dL (ref 30.0–36.0)
MCV: 96.7 fL (ref 80.0–100.0)
Monocytes Absolute: 0.7 10*3/uL (ref 0.1–1.0)
Monocytes Relative: 8 %
Neutro Abs: 6.4 10*3/uL (ref 1.7–7.7)
Neutrophils Relative %: 78 %
Platelets: 191 10*3/uL (ref 150–400)
RBC: 2.7 MIL/uL — ABNORMAL LOW (ref 4.22–5.81)
RDW: 14.2 % (ref 11.5–15.5)
WBC: 8.2 10*3/uL (ref 4.0–10.5)
nRBC: 0 % (ref 0.0–0.2)

## 2020-01-02 MED ORDER — MAGNESIUM SULFATE 2 GM/50ML IV SOLN
2.0000 g | Freq: Once | INTRAVENOUS | Status: AC
  Administered 2020-01-02: 2 g via INTRAVENOUS
  Filled 2020-01-02: qty 50

## 2020-01-02 NOTE — Progress Notes (Signed)
OT Cancellation Note  Patient Details Name: [name] MRN: [id number] DOB: [birth year]   Cancelled Treatment:    Reason Eval/Treat Not Completed: OT screened, no needs identified, will sign off. Per conversation with PT, pt is currently functioning close to/at his baseline with basic ADLs and transfers.   Raynald Kemp, OT Acute Rehabilitation Services Pager: [phone] Office: [phone]  01/02/2020, 10:28 AM
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
## 2020-01-02 NOTE — Progress Notes (Signed)
Patient ID: [name], male   DOB: October 01, 1971, 49 y.o.   MRN: [id number] Patient is postoperative day 1 revision left transtibial amputation.  There is no drainage in the wound VAC canister patient has no complaints.  Patient may discharge to home at any time from the amputation standpoint with the portable Praveena wound VAC pump.  Anticipate the rate limiting step in discharge will be the hip decubitus ulcer.

## 2020-01-02 NOTE — Progress Notes (Signed)
PROGRESS NOTE    [name]  UDJ:[id number] DOB: Sep 07, 1971 DOA: 12/29/2019 PCP: Patient, No Pcp Per    Brief Narrative:  Patient was admitted to the hospital with left stump deep unable to stage wound/left hip stage III pressure ulcer infection/cellulitis.  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 Patient is status post bilateral BKA.  Apparently patient sustained a traumatic injury to his left lower extremity and declined to be brought to the hospital. Over the following weeks he had worsening wound appearance and his family finally convinced him to seek medical attention. On his initial physical examination blood pressure 172/82, heart rate 77, respiratory rate 18, oxygen saturation 90%, he had moist mucous membranes, lungs were clear to auscultation bilaterally, heart S1-2 present rhythm, soft abdomen, no lower extremity edema. Patient had a large unstageable ulcer left stump.Bilateral BKA. Positive left hip stage 3 pressure ulcer with purulent drainage.  Started on broad spectrum antibiotic therapy with vancomycin and ceftriaxone, with no leukocytosis or fever. Patient hemodynamically stable.   Patient evaluated by orthopedic/ plastics surgery. Patient underwent revision left below the knee amputation and application of wound vac. Debridement of left trochanteric pressure ulcer 9x9x3.5 cm and wound vac placement.    Assessment & Plan:   Principal Problem:   Wound infection Active Problems:   Seizures (HCC)   Quadriplegia and quadriparesis (HCC)   Neurogenic bladder   Decubitus ulcer of left hip, stage 4 (HCC)   Dehiscence of amputation stump (HCC)   Malnutrition of moderate degree   1. Left stumpfull skin thickness unable to stageand left hipstage 3pressure ulcers/ present on admission.Wbc at  8,2. Patient underwent surgical procedures with no major complications, now has 2 wound vacs in place. No nausea or vomiting, no fever or chills.  Will follow on wound cultures from hip, for now will continue with IV vancomycin and ceftriaxone.     Will need outpatient follow up for wound care. Will plan for dc home in am.   2. Seizure disorder.On depakotewith good toleration, per his home regimen.  3. Quadriplegia.Fall and wound precautions. Patient will not need home PT or OT.   4. Moderate calorie protein malnutrition. Continue with nutritional supplements.   5. Hypokalemia/ hypomagnesemia. K up to 4,9 with serum Mg at 1,6, renal function stable with serum cr at 0,68 and serum bicarbonate at 25. Will correct Mg with IV Mg sulfate and will follow renal panel in am.   DVT prophylaxis:enoxaparin Code Status:full Family Communication:I spoke with patient'smotherat the bedside, we talked in detail about patient's condition, plan of care and prognosis and all questions were addressed.  Disposition Plan/ discharge barriers:patient from home, plan for dc home in am, with wound culture results and likely oral antibiotic therapy.   Nutrition Status: Nutrition Problem: Moderate Malnutrition Etiology: chronic illness(TBI with spinal cord injury) Signs/Symptoms: energy intake < or equal to 75% for > or equal to 1 month, percent weight loss, mild fat depletion, moderate fat depletion, mild muscle depletion, moderate muscle depletion Percent weight loss: 10 % Interventions: MVI, Juven, Magic cup, Prostat, Refer to RD note for recommendations   Consultants:   Orthopedics  Plastic surgery   Procedures:   Wound revision   Antimicrobials:   Vancomycin   Ceftriaxone     Subjective: Patient is feeling well, no significant pain at the surgical site, no nausea or vomiting, continue to have loose stools,  no dyspnea or chest pain.   Objective: Vitals:   01/01/20 1426  01/01/20 1454 01/01/20 2157 01/02/20 0548  BP: 94/62 92/67 96/66  101/77  Pulse: 62 63 77 77  Resp: 17 18 17 17   Temp:  (!) 97.5 F (36.4 C) 97.9 F (36.6 C) 97.7 F (36.5 C)  TempSrc:  Axillary Oral Oral  SpO2: 99% 100% 100% 100%  Weight:      Height:        Intake/Output Summary (Last 24 hours) at 01/02/2020 1141 Last data filed at 01/02/2020 0800 Gross per 24 hour  Intake 2516.7 ml  Output 1180 ml  Net 1336.7 ml   Filed Weights   12/30/19 0500  Weight: 57.1 kg    Examination:   General: Not in pain or dyspnea, deconditioned  Neurology: Awake and alert, non focal  E ENT: mild pallor, no icterus, oral mucosa moist Cardiovascular: No JVD. S1-S2 present, rhythmic, no gallops, rubs, or murmurs. No lower extremity edema. Pulmonary: positive breath sounds bilaterally, adequate air movement, no wheezing, rhonchi or rales. Gastrointestinal. Abdomen with no organomegaly, non tender, no rebound or guarding Skin. No rashes Musculoskeletal: no joint deformities     Data Reviewed: I have personally reviewed following labs and imaging studies  CBC: Recent Labs  Lab 12/29/19 1930 12/29/19 1930 12/30/19 0755 12/31/19 0303 01/01/20 0217 01/01/20 1214 01/02/20 0224  WBC 12.3*  --  9.8 8.8 6.7  --  8.2  NEUTROABS 8.7*  --   --  5.2 3.7  --  6.4  HGB 9.8*   < > 9.3* 9.6* 9.2* 8.8* 8.5*  HCT 29.9*   < > 28.0* 28.5* 28.5* 26.0* 26.1*  MCV 96.5  --  94.0 93.1 95.3  --  96.7  PLT 175  --  191 188 206  --  191   < > = values in this interval not displayed.   Basic Metabolic Panel: Recent Labs  Lab 12/29/19 1930 12/29/19 1930 12/30/19 0420 12/31/19 0303 01/01/20 0217 01/01/20 1214 01/02/20 0224  NA 135   < > 137 137 140 139 140  K 4.1   < > 3.4* 2.7* 3.8 3.3* 4.9  CL 101  --  100 101 103  --  107  CO2 25  --  25 25 27   --  25  GLUCOSE 79  --  77 78 80  --  124*  BUN 17  --  15 14 18   --  21*  CREATININE 0.88  --  0.72 0.68 0.59*  --  0.68  CALCIUM 7.7*  --  8.1* 8.2*  8.2*  --  8.0*  MG  --   --   --   --   --   --  1.6*   < > = values in this interval not displayed.   GFR: Estimated Creatinine Clearance: 80.7 mL/min (by C-G formula based on SCr of 0.68 mg/dL). Liver Function Tests: Recent Labs  Lab 12/29/19 1930  AST 18  ALT 11  ALKPHOS 60  BILITOT 0.9  PROT 6.0*  ALBUMIN 1.6*   No results for input(s): LIPASE, AMYLASE in the last 168 hours. No results for input(s): AMMONIA in the last 168 hours. Coagulation Profile: No results for input(s): INR, PROTIME in the last 168 hours. Cardiac Enzymes: No results for input(s): CKTOTAL, CKMB, CKMBINDEX, TROPONINI in the last 168 hours. BNP (last 3 results) No results for input(s): PROBNP in the last 8760 hours. HbA1C: No results for input(s): HGBA1C in the last 72  hours. CBG: No results for input(s): GLUCAP in the last 168 hours. Lipid Profile: No results for input(s): CHOL, HDL, LDLCALC, TRIG, CHOLHDL, LDLDIRECT in the last 72 hours. Thyroid Function Tests: No results for input(s): TSH, T4TOTAL, FREET4, T3FREE, THYROIDAB in the last 72 hours. Anemia Panel: No results for input(s): VITAMINB12, FOLATE, FERRITIN, TIBC, IRON, RETICCTPCT in the last 72 hours.    Radiology Studies: I have reviewed all of the imaging during this hospital visit personally     Scheduled Meds: . sodium chloride   Intravenous Once  . divalproex  500 mg Oral TID  . docusate sodium  100 mg Oral BID  . feeding supplement (PRO-STAT SUGAR FREE 64)  30 mL Oral BID  . multivitamin with minerals  1 tablet Oral Daily  . nutrition supplement (JUVEN)  1 packet Oral BID BM   Continuous Infusions: . sodium chloride 1,000 mL (12/29/19 2240)  . sodium chloride 75 mL/hr at 01/02/20 0704  . cefTRIAXone (ROCEPHIN)  IV Stopped (01/01/20 2249)  . lactated ringers 10 mL/hr at 01/01/20 1116  . vancomycin Stopped (01/02/20 0514)     LOS: 4 days        Rober Skeels Annett Gula, MD

## 2020-01-02 NOTE — Progress Notes (Signed)
Pharmacy Antibiotic Note  [name] is a 49 y.o. male admitted on 12/29/2019 with Cellulitis/ concern for osteo.  Pharmacy consulted 12/29/19 for Vancomycin dosing. Scr slight improvement 0.88 on admission, now 0.68. h/o bilateral BKA. Weight 57 kilos. 3/9 COVID: neg WBC WNL now at 8.2, down from 12.3 on admission. Patient on ceftriaxone per MD.    Plan:  Continue Vancomycin 750 mg IV Q 12 hrs.  Monitor clinical status, renal function and culture results daily.  Thank you for allowing pharmacy to be a part of this patient's care.  Height: 5' (152.4 cm) Weight: 125 lb 14.1 oz (57.1 kg) IBW/kg (Calculated) : 50  Temp (24hrs), Avg:97.7 F (36.5 C), Min:97.5 F (36.4 C), Max:97.9 F (36.6 C)  Recent Labs  Lab 12/29/19 1930 12/30/19 0420 12/30/19 0747 12/30/19 0755 12/30/19 1900 12/31/19 0303 01/01/20 0217 01/02/20 0224  WBC 12.3*  --   --  9.8  --  8.8 6.7 8.2  CREATININE 0.88 0.72  --   --   --  0.68 0.59* 0.68  VANCORANDOM  --   --  6  --  10  --   --   --     Estimated Creatinine Clearance: 80.7 mL/min (by C-G formula based on SCr of 0.68 mg/dL).    No Known Allergies  Antimicrobials this admission: 3/9 Ceftriaxone >>  3/9 Vancomycin >>   Dose adjustments this admission:   Microbiology results: 3/9 COVID-negative    Thank you for the interesting consult and for involving pharmacy in this patient's care.  Bradley Ferris, PharmD, BCPS 01/02/2020 9:31 AM PGY-2 Pharmacy Administration Resident Please check [email or web address] for unit-specific pharmacist phone numbers

## 2020-01-03 LAB — CBC WITH DIFFERENTIAL/PLATELET
Abs Immature Granulocytes: 0.11 10*3/uL — ABNORMAL HIGH (ref 0.00–0.07)
Basophils Absolute: 0 10*3/uL (ref 0.0–0.1)
Basophils Relative: 1 %
Eosinophils Absolute: 0.1 10*3/uL (ref 0.0–0.5)
Eosinophils Relative: 1 %
HCT: 23.5 % — ABNORMAL LOW (ref 39.0–52.0)
Hemoglobin: 7.5 g/dL — ABNORMAL LOW (ref 13.0–17.0)
Immature Granulocytes: 2 %
Lymphocytes Relative: 31 %
Lymphs Abs: 2.3 10*3/uL (ref 0.7–4.0)
MCH: 31.3 pg (ref 26.0–34.0)
MCHC: 31.9 g/dL (ref 30.0–36.0)
MCV: 97.9 fL (ref 80.0–100.0)
Monocytes Absolute: 1.1 10*3/uL — ABNORMAL HIGH (ref 0.1–1.0)
Monocytes Relative: 14 %
Neutro Abs: 4 10*3/uL (ref 1.7–7.7)
Neutrophils Relative %: 51 %
Platelets: 212 10*3/uL (ref 150–400)
RBC: 2.4 MIL/uL — ABNORMAL LOW (ref 4.22–5.81)
RDW: 14.5 % (ref 11.5–15.5)
WBC: 7.5 10*3/uL (ref 4.0–10.5)
nRBC: 0.4 % — ABNORMAL HIGH (ref 0.0–0.2)

## 2020-01-03 LAB — BASIC METABOLIC PANEL
Anion gap: 5 (ref 5–15)
BUN: 18 mg/dL (ref 6–20)
CO2: 24 mmol/L (ref 22–32)
Calcium: 7.6 mg/dL — ABNORMAL LOW (ref 8.9–10.3)
Chloride: 111 mmol/L (ref 98–111)
Creatinine, Ser: 0.57 mg/dL — ABNORMAL LOW (ref 0.61–1.24)
GFR calc Af Amer: >60 mL/min (ref >60)
GFR calc non Af Amer: >60 mL/min (ref >60)
Glucose, Bld: 77 mg/dL (ref 70–99)
Potassium: 4 mmol/L (ref 3.5–5.1)
Sodium: 140 mmol/L (ref 135–145)

## 2020-01-03 MED ORDER — ADULT MULTIVITAMIN W/MINERALS CH: 1.0000 | ORAL_TABLET | Freq: Every day | ORAL | 0 refills | Status: AC

## 2020-01-03 MED ORDER — PRO-STAT SUGAR FREE PO LIQD: 30.0000 mL | Freq: Two times a day (BID) | ORAL | 0 refills | Status: AC

## 2020-01-03 MED ORDER — CEPHALEXIN 500 MG PO CAPS
500.0000 mg | ORAL_CAPSULE | Freq: Three times a day (TID) | ORAL | Status: DC
  Administered 2020-01-03: 500 mg via ORAL
  Filled 2020-01-03: qty 1

## 2020-01-03 MED ORDER — CEPHALEXIN 500 MG PO CAPS: 500.0000 mg | ORAL_CAPSULE | Freq: Three times a day (TID) | ORAL | 0 refills | Status: AC

## 2020-01-03 MED ORDER — JUVEN PO PACK: 1.0000 | PACK | Freq: Two times a day (BID) | ORAL | 0 refills | Status: AC

## 2020-01-03 NOTE — Discharge Summary (Addendum)
Physician Discharge Summary  [name] SEG:[id number] DOB: 1971/05/08 DOA: 12/29/2019  PCP: Patient, No Pcp Per  Admit date: 12/29/2019 Discharge date: 01/03/2020  Admitted From: Home  Disposition:  Home   Recommendations for Outpatient Follow-up and new medication changes:  1. Follow up with primary care in 7 days.  2. Continue with cephalexin for 5 more days.  3. Continue wound care/ wound vac 4. Left hip:   Plan to leave wound VAC in place for 1 week, change on 01/08/2020. WOC consult placed for assistance with VAC change.  Would like to be present during change.  After first change on 01/08/2020, continue with twice weekly wound VAC changes.  [facility] home health VAC ordered for when pt is d/c.  5. Left stump:  Dressing care/ Wound VAC: Continue wound VAC for 1 week Follow-up: In the office 1 week post operative.   Home Health: wound care   Equipment/Devices: wound vac  Discharge Condition: stable  CODE STATUS: full  Diet recommendation: heart healthy   Brief/Interim Summary: Patient was admitted to the hospital with left stumpdeep (unable to stage)wound/ left hip stage III/ IV pressure ulcer infection/cellulitis.  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 Patient is status post bilateral BKA.Apparently patient sustained a traumatic injury to his left lower extremity stump and declined to be brought to the hospital. Over the following weeks he had worsening wound appearance and his family finally convinced him to seek medical attention.On his initial physical examination blood pressure 172/82, heart rate 77, respiratory rate18, oxygen saturation 100%, he had moist mucous membranes, lungs were clear to auscultation bilaterally, heart S1-2 present rhythm, soft abdomen, no lower extremity edema.  Patient had a large unstageable ulcer at his left stump.Bilateral BKA. Positive left hipstage 3pressure ulcer with purulent drainage. Sodium 135, potassium 4.1, chloride 101, bicarb 25, glucose 79, BUN 17, creatinine 0.88 count 12.3, hemoglobin 9.8, hematocrit 29.9, platelets 175.  SARS COVID-19 was negative.  Patient was started on broad spectrum IV antibiotic therapy with vancomycin and ceftriaxone, with improving leukocytosis and no fever. Patient hemodynamically stable.  Patient evaluated by orthopedic/ plasticssurgery. Patient underwent revision of left below the knee amputation and application of wound vac. Debridement of left trochanteric pressure ulcer 9x9x3.5 cm and wound vac placement.    1.  Left stump full skin thickness (unable to stage) and left hip stage III/ IV pressure ulcer, present on admission.  Patient was admitted to the medical ward, he received broad-spectrum antibiotic therapy with IV vancomycin and ceftriaxone.  Patient was evaluated by wound care and orthopedic surgery.  Patient underwent revision of the left below-knee amputation wound and debridement of left trochanteric pressure ulcer.  Leukocytosis improved, his cultures including left trochanteric wound have been no growth.  Antibiotic therapy was deescalated to cephalexin to continue for 5 more days.  Patient will be discharged home on p.o., follow-up as an outpatient.  2.  Postoperative anemia.  His hemoglobin ranged between 8.5 and 7.5, patient will follow-up as an outpatient, hold PRBC transfusion unless hemoglobin less than 7.  3.  Seizures.  Patient continue Depakote.  4.  Hypokalemia/hypomagnesemia.  Electrolytes were corrected, his kidney function remained stable.  5.  Quadriparesis/neurogenic bladder..  Patient was seen by Occupational Therapy and physical therapy, no follow-up recommended.  Patient remained with condom catheter.  6.  Moderate calorie protein malnutrition.  Patient has been placed  on  nutritional supplements.  Will need close follow-up as an outpatient.  Discharge Diagnoses:  Principal Problem:   Wound infection Active Problems:   Seizures (HCC)   Quadriplegia and quadriparesis (HCC)   Neurogenic bladder   Decubitus ulcer of left hip, stage 4 (HCC)   Dehiscence of amputation stump (HCC)   Malnutrition of moderate degree    Discharge Instructions  Discharge Instructions    Negative Pressure Wound Therapy - Incisional   Complete by: As directed    Show patient how to attach prevena vac     Allergies as of 01/03/2020   No Known Allergies     Medication List    TAKE these medications   cephALEXin 500 MG capsule Commonly known as: KEFLEX Take 1 capsule (500 mg total) by mouth every 8 (eight) hours for 5 days. What changed:   when to take this  additional instructions   divalproex 500 MG 24 hr tablet Commonly known as: Depakote ER Take 1 tablet three times a day   feeding supplement (PRO-STAT SUGAR FREE 64) Liqd Take 30 mLs by mouth 2 (two) times daily.   multivitamin with minerals Tabs tablet Take 1 tablet by mouth daily. Start taking on: January 04, 2020   nutrition supplement (JUVEN) Pack Take 1 packet by mouth 2 (two) times daily between meals.      Follow-up Information    [name], MD In 1 week.   Specialty: Orthopedic Surgery Contact information: [address] [place] [address] [phone]          No Known Allergies  Consultations:  Orthopedic surgery   Plastic surgery    Procedures/Studies: DG Tibia/Fibula Right  Result Date: 12/29/2019 CLINICAL DATA:  Ulceration and concern for osteomyelitis.  Sepsis. EXAM: RIGHT TIBIA AND FIBULA - 2 VIEW COMPARISON:  December 14, 2015 FINDINGS: Diffuse nonspecific soft tissue edema or cellulitis and trace joint fluid without osseous erosion. A 2 cm ovoid intra-articular loose body in the tibiofemoral lateral compartment. Remote below the knee amputation. Chronic small  cortical avulsion from the medial femoral condyle. Sequela of prior hardware removal from the distal femoral shaft. IMPRESSION: Diffuse soft tissue edema or cellulitis with trace joint fluid, but no bony erosion. Electronically Signed   By: Laurence Ferrari   On: 12/29/2019 20:02      Procedures:  PROCEDURE:  REVISION LEFT BELOW KNEE AMPUTATION Application Prevena wound VAC with customizable Arthur form dressing  1.  Debridement of left trochanteric pressure ulcer 9 x 9 x 3.5 cm.  This was an excisional debridement using the pulse lavage irrigator. 2.  Placement of prime matrix AG to left pressure ulcer 9 x 9 x 3.5 cm 3.  Placement of wound VAC to left trochanteric pressure ulcer wound 9 x 9 x 3.5 cm  Subjective: Patient is feeling well, no nausea or vomiting, no chest pain or dyspnea,   Discharge Exam: Vitals:   01/02/20 2049 01/03/20 0356  BP: 109/86 (!) 102/56  Pulse: 93 86  Resp: 18 18  Temp: 98.5 F (36.9 C) 97.8 F (36.6 C)  SpO2: 91% 100%   Vitals:   01/02/20 0548 01/02/20 1510 01/02/20 2049 01/03/20 0356  BP: 101/77 (!) 98/54 109/86 (!) 102/56  Pulse: 77 85 93 86  Resp: 17 18 18 18   Temp: 97.7 F (36.5 C) 97.6 F (36.4 C) 98.5 F (36.9 C) 97.8 F (36.6 C)  TempSrc: Oral Oral Oral Oral  SpO2: 100% 100% 91% 100%  Weight:  Height:        General: Not in pain or dyspnea.  Neurology: Awake and alert, non focal  E ENT: no pallor, no icterus, oral mucosa moist Cardiovascular: No JVD. S1-S2 present, rhythmic, no gallops, rubs, or murmurs. No lower extremity edema. Pulmonary: positive breath sounds bilaterally, adequate air movement, no wheezing, rhonchi or rales. Gastrointestinal. Abdomen with no organomegaly, non tender, no rebound or guarding Skin: left throchanteric wound with dressing in place/ left stump, wound vac in place.  Musculoskeletal: bilateral BKA   The results of significant diagnostics from this hospitalization (including imaging, microbiology,  ancillary and laboratory) are listed below for reference.     Microbiology: Recent Results (from the past 240 hour(s))  SARS CORONAVIRUS 2 (TAT 6-24 HRS) Nasopharyngeal Nasopharyngeal Swab     Status: None   Collection Time: 12/29/19 10:41 PM   Specimen: Nasopharyngeal Swab  Result Value Ref Range Status   SARS Coronavirus 2 NEGATIVE NEGATIVE Final    Comment: (NOTE) SARS-CoV-2 target nucleic acids are NOT DETECTED. The SARS-CoV-2 RNA is generally detectable in upper and lower respiratory specimens during the acute phase of infection. Negative results do not preclude SARS-CoV-2 infection, do not rule out co-infections with other pathogens, and should not be used as the sole basis for treatment or other patient management decisions. Negative results must be combined with clinical observations, patient history, and epidemiological information. The expected result is Negative. Fact Sheet for Patients: HairSlick.nohttps://www.fda.gov/media/138098/download Fact Sheet for Healthcare Providers: quierodirigir.comhttps://www.fda.gov/media/138095/download This test is not yet approved or cleared by the [place] FDA and  has been authorized for detection and/or diagnosis of SARS-CoV-2 by FDA under an Emergency Use Authorization (EUA). This EUA will remain  in effect (meaning this test can be used) for the duration of the COVID-19 declaration under Section 56 4(b)(1) of the Act, 21 U.S.C. section 360bbb-3(b)(1), unless the authorization is terminated or revoked sooner. Performed at [facility], [address] [place], [place] [address]   MRSA PCR Screening     Status: None   Collection Time: 12/31/19  6:56 PM   Specimen: Nasal Mucosa; Nasopharyngeal  Result Value Ref Range Status   MRSA by PCR NEGATIVE NEGATIVE Final    Comment:        The GeneXpert MRSA Assay (FDA approved for NASAL specimens only), is one component of a comprehensive MRSA colonization surveillance program. It is not intended to  diagnose MRSA infection nor to guide or monitor treatment for MRSA infections. Performed at [facility], [address] [place], [place] [address]   Aerobic/Anaerobic Culture (surgical/deep wound)     Status: None (Preliminary result)   Collection Time: 01/01/20 12:38 PM   Specimen: Soft Tissue, Other  Result Value Ref Range Status   Specimen Description TISSUE LEFT HIP  Final   Special Requests A  Final   Gram Stain   Final    FEW WBC PRESENT, PREDOMINANTLY PMN MODERATE GRAM NEGATIVE RODS RARE GRAM POSITIVE COCCI    Culture   Final    NO GROWTH < 24 HOURS Performed at [facility], [address] [place], [place] [address]    Report Status PENDING  Incomplete     Labs: BNP (last 3 results) No results for input(s): BNP in the last 8760 hours. Basic Metabolic Panel: Recent Labs  Lab 12/30/19 0420 12/30/19 0420 12/31/19 0303 01/01/20 0217 01/01/20 1214 01/02/20 0224 01/03/20 0442  NA 137   < > 137 140 139 140 140  K 3.4*   < > 2.7* 3.8 3.3* 4.9 4.0  CL 100  --  101 103  --  107 111  CO2 25  --  25 27  --  25 24  GLUCOSE 77  --  78 80  --  124* 77  BUN 15  --  14 18  --  21* 18  CREATININE 0.72  --  0.68 0.59*  --  0.68 0.57*  CALCIUM 8.1*  --  8.2* 8.2*  --  8.0* 7.6*  MG  --   --   --   --   --  1.6*  --    < > = values in this interval not displayed.   Liver Function Tests: Recent Labs  Lab 12/29/19 1930  AST 18  ALT 11  ALKPHOS 60  BILITOT 0.9  PROT 6.0*  ALBUMIN 1.6*   No results for input(s): LIPASE, AMYLASE in the last 168 hours. No results for input(s): AMMONIA in the last 168 hours. CBC: Recent Labs  Lab 12/29/19 1930 12/29/19 1930 12/30/19 0755 12/30/19 0755 12/31/19 0303 01/01/20 0217 01/01/20 1214 01/02/20 0224 01/03/20 0442  WBC 12.3*   < > 9.8  --  8.8 6.7  --  8.2 7.5  NEUTROABS 8.7*  --   --   --  5.2 3.7  --  6.4 4.0  HGB 9.8*   < > 9.3*   < > 9.6* 9.2* 8.8* 8.5* 7.5*  HCT 29.9*   < > 28.0*   < > 28.5* 28.5*  26.0* 26.1* 23.5*  MCV 96.5   < > 94.0  --  93.1 95.3  --  96.7 97.9  PLT 175   < > 191  --  188 206  --  191 212   < > = values in this interval not displayed.   Cardiac Enzymes: No results for input(s): CKTOTAL, CKMB, CKMBINDEX, TROPONINI in the last 168 hours. BNP: Invalid input(s): POCBNP CBG: No results for input(s): GLUCAP in the last 168 hours. D-Dimer No results for input(s): DDIMER in the last 72 hours. Hgb A1c No results for input(s): HGBA1C in the last 72 hours. Lipid Profile No results for input(s): CHOL, HDL, LDLCALC, TRIG, CHOLHDL, LDLDIRECT in the last 72 hours. Thyroid function studies No results for input(s): TSH, T4TOTAL, T3FREE, THYROIDAB in the last 72 hours.  Invalid input(s): FREET3 Anemia work up No results for input(s): VITAMINB12, FOLATE, FERRITIN, TIBC, IRON, RETICCTPCT in the last 72 hours. Urinalysis    Component Value Date/Time   COLORURINE YELLOW 06/25/2019 1601   APPEARANCEUR CLOUDY (A) 06/25/2019 1601   LABSPEC 1.011 06/25/2019 1601   PHURINE 6.0 06/25/2019 1601   GLUCOSEU NEGATIVE 06/25/2019 1601   HGBUR SMALL (A) 06/25/2019 1601   BILIRUBINUR NEGATIVE 06/25/2019 1601   KETONESUR NEGATIVE 06/25/2019 1601   PROTEINUR NEGATIVE 06/25/2019 1601   UROBILINOGEN 1.0 06/07/2013 1918   NITRITE POSITIVE (A) 06/25/2019 1601   LEUKOCYTESUR LARGE (A) 06/25/2019 1601   Sepsis Labs Invalid input(s): PROCALCITONIN,  WBC,  LACTICIDVEN Microbiology Recent Results (from the past 240 hour(s))  SARS CORONAVIRUS 2 (TAT 6-24 HRS) Nasopharyngeal Nasopharyngeal Swab     Status: None   Collection Time: 12/29/19 10:41 PM   Specimen: Nasopharyngeal Swab  Result Value Ref Range Status   SARS Coronavirus 2 NEGATIVE NEGATIVE Final    Comment: (NOTE) SARS-CoV-2 target nucleic acids are NOT DETECTED. The SARS-CoV-2 RNA is generally detectable in upper and lower respiratory specimens during the acute phase of infection. Negative  results do not preclude SARS-CoV-2  infection, do not rule out co-infections with other pathogens, and should not be used as the sole basis for treatment or other patient management decisions. Negative results must be combined with clinical observations, patient history, and epidemiological information. The expected result is Negative. Fact Sheet for Patients: HairSlick.no Fact Sheet for Healthcare Providers: quierodirigir.com This test is not yet approved or cleared by the [place] FDA and  has been authorized for detection and/or diagnosis of SARS-CoV-2 by FDA under an Emergency Use Authorization (EUA). This EUA will remain  in effect (meaning this test can be used) for the duration of the COVID-19 declaration under Section 56 4(b)(1) of the Act, 21 U.S.C. section 360bbb-3(b)(1), unless the authorization is terminated or revoked sooner. Performed at [facility], [address], [place], [place] [address]   MRSA PCR Screening     Status: None   Collection Time: 12/31/19  6:56 PM   Specimen: Nasal Mucosa; Nasopharyngeal  Result Value Ref Range Status   MRSA by PCR NEGATIVE NEGATIVE Final    Comment:        The GeneXpert MRSA Assay (FDA approved for NASAL specimens only), is one component of a comprehensive MRSA colonization surveillance program. It is not intended to diagnose MRSA infection nor to guide or monitor treatment for MRSA infections. Performed at [facility], [address] [place], [place] [address]   Aerobic/Anaerobic Culture (surgical/deep wound)     Status: None (Preliminary result)   Collection Time: 01/01/20 12:38 PM   Specimen: Soft Tissue, Other  Result Value Ref Range Status   Specimen Description TISSUE LEFT HIP  Final   Special Requests A  Final   Gram Stain   Final    FEW WBC PRESENT, PREDOMINANTLY PMN MODERATE GRAM NEGATIVE RODS RARE GRAM POSITIVE COCCI    Culture   Final    NO GROWTH < 24 HOURS Performed  at [facility], [address], [place], [place] [address]    Report Status PENDING  Incomplete     Time coordinating discharge: 45 minutes  SIGNED:   Coralie Keens, MD  [facility] 01/03/2020, 8:45 AM

## 2020-01-03 NOTE — TOC Transition Note (Signed)
Transition of Care [facility]) - CM/SW Discharge Note   Patient Details  Name: Garvis Downum MRN: [id number] Date of Birth: 1971-01-06  Transition of Care [facility] CM/SW Contact:  Deveron Furlong, RN 01/03/2020, 1:23 PM   Clinical Narrative:    Discussed dc plan with patient's mother.  She or patient have no preference of HH agency.  Becky Sax with [facility] accepted referral.   [facility] wound vac delivered yesterday for left hip.  Burnice Logan will be obtained and changed over today by nursing staff.     Final next level of care: Home w Home Health Services Barriers to Discharge: No Barriers Identified   Patient Goals and CMS Choice Patient states their goals for this hospitalization and ongoing recovery are:: to return to home CMS Medicare.gov Compare Post Acute Care list provided to:: Patient Choice offered to / list presented to : Patient, Parent   Discharge Plan and Services     Post Acute Care Choice: Home Health          DME Arranged: Vac DME Agency: [facility] Date DME Agency Contacted: 01/01/20 Time DME Agency Contacted: [phone] Representative spoke with at DME Agency: [place] [facility] has been released HH Arranged: RN HH Agency: [facility] Date [facility] Agency Contacted: 01/03/20 Time HH Agency Contacted: [phone] Representative spoke with at [facility] Agency: Becky Sax

## 2020-01-04 LAB — BPAM RBC
Blood Product Expiration Date: 202103282359
Blood Product Expiration Date: 202103302359
ISSUE DATE / TIME: 202103020702
Unit Type and Rh: 9500
Unit Type and Rh: 9500

## 2020-01-04 LAB — TYPE AND SCREEN
ABO/RH(D): O NEG
Antibody Screen: NEGATIVE
Unit division: 0
Unit division: 0

## 2020-01-05 ENCOUNTER — Encounter: Payer: Medicare Other | Admitting: Physical Medicine & Rehabilitation

## 2020-01-05 DIAGNOSIS — Z89512 Acquired absence of left leg below knee: Secondary | ICD-10-CM | POA: Diagnosis not present

## 2020-01-05 DIAGNOSIS — K592 Neurogenic bowel, not elsewhere classified: Secondary | ICD-10-CM | POA: Diagnosis not present

## 2020-01-05 DIAGNOSIS — Z466 Encounter for fitting and adjustment of urinary device: Secondary | ICD-10-CM | POA: Diagnosis not present

## 2020-01-05 DIAGNOSIS — T8781 Dehiscence of amputation stump: Secondary | ICD-10-CM | POA: Diagnosis not present

## 2020-01-05 DIAGNOSIS — G40909 Epilepsy, unspecified, not intractable, without status epilepticus: Secondary | ICD-10-CM | POA: Diagnosis not present

## 2020-01-05 DIAGNOSIS — L89220 Pressure ulcer of left hip, unstageable: Secondary | ICD-10-CM | POA: Diagnosis not present

## 2020-01-05 DIAGNOSIS — N319 Neuromuscular dysfunction of bladder, unspecified: Secondary | ICD-10-CM | POA: Diagnosis not present

## 2020-01-05 DIAGNOSIS — E440 Moderate protein-calorie malnutrition: Secondary | ICD-10-CM | POA: Diagnosis not present

## 2020-01-05 DIAGNOSIS — Z89521 Acquired absence of right knee: Secondary | ICD-10-CM | POA: Diagnosis not present

## 2020-01-05 DIAGNOSIS — G8250 Quadriplegia, unspecified: Secondary | ICD-10-CM | POA: Diagnosis not present

## 2020-01-06 LAB — AEROBIC/ANAEROBIC CULTURE W GRAM STAIN (SURGICAL/DEEP WOUND)

## 2020-01-08 ENCOUNTER — Other Ambulatory Visit: Payer: Self-pay

## 2020-01-08 ENCOUNTER — Telehealth: Payer: Self-pay

## 2020-01-08 ENCOUNTER — Encounter: Payer: Self-pay | Admitting: Physician Assistant

## 2020-01-08 ENCOUNTER — Ambulatory Visit (INDEPENDENT_AMBULATORY_CARE_PROVIDER_SITE_OTHER): Payer: Medicare Other | Admitting: Physician Assistant

## 2020-01-08 VITALS — Ht 60.0 in | Wt 125.0 lb

## 2020-01-08 DIAGNOSIS — N319 Neuromuscular dysfunction of bladder, unspecified: Secondary | ICD-10-CM | POA: Diagnosis not present

## 2020-01-08 DIAGNOSIS — T8781 Dehiscence of amputation stump: Secondary | ICD-10-CM | POA: Diagnosis not present

## 2020-01-08 DIAGNOSIS — Z89512 Acquired absence of left leg below knee: Secondary | ICD-10-CM

## 2020-01-08 DIAGNOSIS — E440 Moderate protein-calorie malnutrition: Secondary | ICD-10-CM | POA: Diagnosis not present

## 2020-01-08 DIAGNOSIS — L89220 Pressure ulcer of left hip, unstageable: Secondary | ICD-10-CM | POA: Diagnosis not present

## 2020-01-08 DIAGNOSIS — Z89511 Acquired absence of right leg below knee: Secondary | ICD-10-CM

## 2020-01-08 DIAGNOSIS — G8250 Quadriplegia, unspecified: Secondary | ICD-10-CM | POA: Diagnosis not present

## 2020-01-08 DIAGNOSIS — K592 Neurogenic bowel, not elsewhere classified: Secondary | ICD-10-CM | POA: Diagnosis not present
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
## 2020-01-08 NOTE — Telephone Encounter (Signed)
Recieved call from [place] [phone] from [facility] letting us know that patient was in need of a wound vac change. Per Keenan Bachelor, PA-C, he requested the vac be changed today as well as Tuesday, 3/23 and Friday, 3/26. The patient will follow up in clinic with Dr. Arita Miss on Thursday, 3/25. This information was relayed to the nurse who provided the patient with the appointment information.

## 2020-01-12 ENCOUNTER — Ambulatory Visit: Payer: Medicare Other | Admitting: Orthopedic Surgery

## 2020-01-12 DIAGNOSIS — E440 Moderate protein-calorie malnutrition: Secondary | ICD-10-CM | POA: Diagnosis not present

## 2020-01-12 DIAGNOSIS — G8250 Quadriplegia, unspecified: Secondary | ICD-10-CM | POA: Diagnosis not present

## 2020-01-12 DIAGNOSIS — K592 Neurogenic bowel, not elsewhere classified: Secondary | ICD-10-CM | POA: Diagnosis not present

## 2020-01-12 DIAGNOSIS — L89220 Pressure ulcer of left hip, unstageable: Secondary | ICD-10-CM | POA: Diagnosis not present

## 2020-01-12 DIAGNOSIS — N319 Neuromuscular dysfunction of bladder, unspecified: Secondary | ICD-10-CM | POA: Diagnosis not present

## 2020-01-12 DIAGNOSIS — T8781 Dehiscence of amputation stump: Secondary | ICD-10-CM | POA: Diagnosis not present

## 2020-01-14 ENCOUNTER — Ambulatory Visit: Payer: Medicare Other | Admitting: Plastic Surgery

## 2020-01-15 DIAGNOSIS — T8781 Dehiscence of amputation stump: Secondary | ICD-10-CM | POA: Diagnosis not present

## 2020-01-15 DIAGNOSIS — G8250 Quadriplegia, unspecified: Secondary | ICD-10-CM | POA: Diagnosis not present

## 2020-01-15 DIAGNOSIS — K592 Neurogenic bowel, not elsewhere classified: Secondary | ICD-10-CM | POA: Diagnosis not present

## 2020-01-15 DIAGNOSIS — E440 Moderate protein-calorie malnutrition: Secondary | ICD-10-CM | POA: Diagnosis not present

## 2020-01-15 DIAGNOSIS — N319 Neuromuscular dysfunction of bladder, unspecified: Secondary | ICD-10-CM | POA: Diagnosis not present

## 2020-01-15 DIAGNOSIS — L89220 Pressure ulcer of left hip, unstageable: Secondary | ICD-10-CM | POA: Diagnosis not present

## 2020-01-19 ENCOUNTER — Ambulatory Visit (INDEPENDENT_AMBULATORY_CARE_PROVIDER_SITE_OTHER): Payer: Medicare Other | Admitting: Physician Assistant

## 2020-01-19 ENCOUNTER — Other Ambulatory Visit: Payer: Self-pay

## 2020-01-19 ENCOUNTER — Encounter: Payer: Self-pay | Admitting: Physician Assistant

## 2020-01-19 DIAGNOSIS — L89220 Pressure ulcer of left hip, unstageable: Secondary | ICD-10-CM | POA: Diagnosis not present

## 2020-01-19 DIAGNOSIS — N319 Neuromuscular dysfunction of bladder, unspecified: Secondary | ICD-10-CM | POA: Diagnosis not present

## 2020-01-19 DIAGNOSIS — E440 Moderate protein-calorie malnutrition: Secondary | ICD-10-CM | POA: Diagnosis not present

## 2020-01-19 DIAGNOSIS — Z89511 Acquired absence of right leg below knee: Secondary | ICD-10-CM

## 2020-01-19 DIAGNOSIS — G8250 Quadriplegia, unspecified: Secondary | ICD-10-CM | POA: Diagnosis not present

## 2020-01-19 DIAGNOSIS — K592 Neurogenic bowel, not elsewhere classified: Secondary | ICD-10-CM | POA: Diagnosis not present

## 2020-01-19 DIAGNOSIS — Z89512 Acquired absence of left leg below knee: Secondary | ICD-10-CM

## 2020-01-19 DIAGNOSIS — T8781 Dehiscence of amputation stump: Secondary | ICD-10-CM | POA: Diagnosis not present
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
## 2020-01-20 ENCOUNTER — Encounter: Payer: Self-pay | Admitting: Plastic Surgery

## 2020-01-20 ENCOUNTER — Ambulatory Visit (INDEPENDENT_AMBULATORY_CARE_PROVIDER_SITE_OTHER): Payer: Medicare Other | Admitting: Plastic Surgery

## 2020-01-20 VITALS — BP 146/105

## 2020-01-20 DIAGNOSIS — L89224 Pressure ulcer of left hip, stage 4: Secondary | ICD-10-CM | POA: Diagnosis not present

## 2020-01-20 NOTE — Progress Notes (Signed)
   Referring Provider No referring provider defined for this encounter.   CC:  Chief Complaint  Patient presents with  . Post-op Follow-up    SX: 01/01/20 left trochanteric pressure ulcer      [name] is an 49 y.o. male.   HPI: Patient presents in follow-up for debridement and application of prime matrix to the left trochanteric pressure ulcer.  He feels like things to be going fine and home health has been coming up to do his wound VAC changes.  He has not had any fevers or chills.  Review of Systems General: Denies fevers or chills.  Physical Exam Vitals with BMI 01/20/2020 01/08/2020 01/03/2020  Height - 5\' 0"  -  Weight - 125 lbs -  BMI - 24.41 -  Systolic 146 - 102  Diastolic - 56  Pulse - - 86    General:  No acute distress,  Alert and oriented, Non-Toxic, Normal speech and affect The wound VAC on his left trochanteric pressure ulcer was removed revealing a healthy wound bed.  There is no purulence or drainage.  There is no surrounding erythema.  A new wound VAC was applied with black sponge.  Assessment/Plan Patient is doing well after debridement and wound VAC application to his trochanteric pressure ulcer.  He is about 2 weeks postop at this point.  I plan to continue the wound VAC for a while.  He is not a very good flap candidate as he still smokes.  I will plan to see him again in 2 months.  He knows if he has any trouble in the meantime to call and we can see him sooner.  161 01/20/2020, 11:09 AM

## 2020-01-21 DIAGNOSIS — E440 Moderate protein-calorie malnutrition: Secondary | ICD-10-CM | POA: Diagnosis not present

## 2020-01-21 DIAGNOSIS — K592 Neurogenic bowel, not elsewhere classified: Secondary | ICD-10-CM | POA: Diagnosis not present

## 2020-01-21 DIAGNOSIS — G8250 Quadriplegia, unspecified: Secondary | ICD-10-CM | POA: Diagnosis not present

## 2020-01-21 DIAGNOSIS — N319 Neuromuscular dysfunction of bladder, unspecified: Secondary | ICD-10-CM | POA: Diagnosis not present

## 2020-01-21 DIAGNOSIS — L89220 Pressure ulcer of left hip, unstageable: Secondary | ICD-10-CM | POA: Diagnosis not present

## 2020-01-21 DIAGNOSIS — T8781 Dehiscence of amputation stump: Secondary | ICD-10-CM | POA: Diagnosis not present

## 2020-01-26 ENCOUNTER — Telehealth: Payer: Self-pay

## 2020-01-26 ENCOUNTER — Telehealth: Payer: Self-pay | Admitting: Radiology

## 2020-01-26 DIAGNOSIS — G8250 Quadriplegia, unspecified: Secondary | ICD-10-CM | POA: Diagnosis not present

## 2020-01-26 DIAGNOSIS — N319 Neuromuscular dysfunction of bladder, unspecified: Secondary | ICD-10-CM | POA: Diagnosis not present

## 2020-01-26 DIAGNOSIS — T8781 Dehiscence of amputation stump: Secondary | ICD-10-CM | POA: Diagnosis not present

## 2020-01-26 DIAGNOSIS — K592 Neurogenic bowel, not elsewhere classified: Secondary | ICD-10-CM | POA: Diagnosis not present

## 2020-01-26 DIAGNOSIS — L89220 Pressure ulcer of left hip, unstageable: Secondary | ICD-10-CM | POA: Diagnosis not present

## 2020-01-26 DIAGNOSIS — E440 Moderate protein-calorie malnutrition: Secondary | ICD-10-CM | POA: Diagnosis not present

## 2020-01-26 NOTE — Telephone Encounter (Signed)
Tried to call [facility] to advise ok for orders as requested no answer and mailbox full. Will hold and try again later.

## 2020-01-26 NOTE — Telephone Encounter (Signed)
Returned call, LM on VM that Dr. Arita Miss okay'd to go from 2 weeks to 3 weeks for wound vac change.

## 2020-01-26 NOTE — Telephone Encounter (Signed)
HH Nurse called, states just left the patients home and states that the stump has drainage and odor to it.  She would like to increase the # of visits from 2 x per week to 3 x per week. I advised that I would call to get him an appt this week (he is scheduled for 02/02/2020) but the cell number there was no answer and the home number I left a message on the machine for hi to call the office back.  The states that she doesn't know how often the family is actually changing the dressing when they aren't there.

## 2020-01-26 NOTE — Telephone Encounter (Signed)
Received call from nurse with [facility] [phone], requesting verbal orders to increase visits from 2 times weekly to 3 times weekly for wound vac changes.

## 2020-01-28 DIAGNOSIS — K592 Neurogenic bowel, not elsewhere classified: Secondary | ICD-10-CM | POA: Diagnosis not present

## 2020-01-28 DIAGNOSIS — N319 Neuromuscular dysfunction of bladder, unspecified: Secondary | ICD-10-CM | POA: Diagnosis not present

## 2020-01-28 DIAGNOSIS — E440 Moderate protein-calorie malnutrition: Secondary | ICD-10-CM | POA: Diagnosis not present

## 2020-01-28 DIAGNOSIS — G8250 Quadriplegia, unspecified: Secondary | ICD-10-CM | POA: Diagnosis not present

## 2020-01-28 DIAGNOSIS — T8781 Dehiscence of amputation stump: Secondary | ICD-10-CM | POA: Diagnosis not present

## 2020-01-28 DIAGNOSIS — L89220 Pressure ulcer of left hip, unstageable: Secondary | ICD-10-CM | POA: Diagnosis not present

## 2020-01-29 NOTE — Telephone Encounter (Signed)
Unable to reach pt will update orders at visit on Tuesday.

## 2020-01-30 DIAGNOSIS — L89220 Pressure ulcer of left hip, unstageable: Secondary | ICD-10-CM | POA: Diagnosis not present

## 2020-01-30 DIAGNOSIS — G8250 Quadriplegia, unspecified: Secondary | ICD-10-CM | POA: Diagnosis not present

## 2020-01-30 DIAGNOSIS — T8781 Dehiscence of amputation stump: Secondary | ICD-10-CM | POA: Diagnosis not present

## 2020-01-30 DIAGNOSIS — K592 Neurogenic bowel, not elsewhere classified: Secondary | ICD-10-CM | POA: Diagnosis not present

## 2020-01-30 DIAGNOSIS — N319 Neuromuscular dysfunction of bladder, unspecified: Secondary | ICD-10-CM | POA: Diagnosis not present

## 2020-01-30 DIAGNOSIS — E440 Moderate protein-calorie malnutrition: Secondary | ICD-10-CM | POA: Diagnosis not present

## 2020-02-01 ENCOUNTER — Telehealth: Payer: Self-pay | Admitting: Radiology

## 2020-02-01 DIAGNOSIS — K592 Neurogenic bowel, not elsewhere classified: Secondary | ICD-10-CM | POA: Diagnosis not present

## 2020-02-01 DIAGNOSIS — E440 Moderate protein-calorie malnutrition: Secondary | ICD-10-CM | POA: Diagnosis not present

## 2020-02-01 DIAGNOSIS — L89220 Pressure ulcer of left hip, unstageable: Secondary | ICD-10-CM | POA: Diagnosis not present

## 2020-02-01 DIAGNOSIS — G8250 Quadriplegia, unspecified: Secondary | ICD-10-CM | POA: Diagnosis not present

## 2020-02-01 DIAGNOSIS — T8781 Dehiscence of amputation stump: Secondary | ICD-10-CM | POA: Diagnosis not present

## 2020-02-01 DIAGNOSIS — N319 Neuromuscular dysfunction of bladder, unspecified: Secondary | ICD-10-CM | POA: Diagnosis not present

## 2020-02-01 NOTE — Telephone Encounter (Signed)
Davine with [facility] called regarding patient's wound. She saw the patient over the weekend for dry dressing change and noticed significant changes to the patient's wound. She states that there is eschar and slough all through wound and she can almost put her finger down in it. She feels that it may at least need debridement. Patient is scheduled for follow up appointment tomorrow, but she wanted someone to be aware of changes.    CB for Noralee Chars is [phone], however she is weekend nurse. She requests any change in orders be sent to [facility].

## 2020-02-01 NOTE — Telephone Encounter (Signed)
Will hold this message till after office visit to discuss changes in wound care orders should there be any with [facility].

## 2020-02-02 ENCOUNTER — Other Ambulatory Visit: Payer: Self-pay

## 2020-02-02 ENCOUNTER — Ambulatory Visit (INDEPENDENT_AMBULATORY_CARE_PROVIDER_SITE_OTHER): Payer: Medicare Other | Admitting: Family

## 2020-02-02 DIAGNOSIS — Z89512 Acquired absence of left leg below knee: Secondary | ICD-10-CM

## 2020-02-02 DIAGNOSIS — Z89511 Acquired absence of right leg below knee: Secondary | ICD-10-CM

## 2020-02-02 DIAGNOSIS — T8781 Dehiscence of amputation stump: Secondary | ICD-10-CM

## 2020-02-03 DIAGNOSIS — G8250 Quadriplegia, unspecified: Secondary | ICD-10-CM | POA: Diagnosis not present

## 2020-02-03 DIAGNOSIS — E440 Moderate protein-calorie malnutrition: Secondary | ICD-10-CM | POA: Diagnosis not present

## 2020-02-03 DIAGNOSIS — L89220 Pressure ulcer of left hip, unstageable: Secondary | ICD-10-CM | POA: Diagnosis not present

## 2020-02-03 DIAGNOSIS — K592 Neurogenic bowel, not elsewhere classified: Secondary | ICD-10-CM | POA: Diagnosis not present

## 2020-02-03 DIAGNOSIS — N319 Neuromuscular dysfunction of bladder, unspecified: Secondary | ICD-10-CM | POA: Diagnosis not present

## 2020-02-03 DIAGNOSIS — T8781 Dehiscence of amputation stump: Secondary | ICD-10-CM | POA: Diagnosis not present

## 2020-02-03 NOTE — Telephone Encounter (Signed)
I called and lm on vm to advise that we did see the pt yesterday and that he is being scheduled for revision of BKA on the left. He will have surgery this week and so he will not need any updated wound care orders. Will be d/c with a prevena wound vac and will leave this intact until his first post op appt. To call with any questions.

## 2020-02-03 NOTE — Telephone Encounter (Signed)
Right, just dry dressing until surgery friday

## 2020-02-03 NOTE — Telephone Encounter (Signed)
Any updated orders for this pt? Is he added on for surgery?

## 2020-02-04 DIAGNOSIS — T8781 Dehiscence of amputation stump: Secondary | ICD-10-CM | POA: Diagnosis not present

## 2020-02-04 DIAGNOSIS — E440 Moderate protein-calorie malnutrition: Secondary | ICD-10-CM | POA: Diagnosis not present

## 2020-02-04 DIAGNOSIS — G8250 Quadriplegia, unspecified: Secondary | ICD-10-CM | POA: Diagnosis not present

## 2020-02-04 DIAGNOSIS — L89220 Pressure ulcer of left hip, unstageable: Secondary | ICD-10-CM | POA: Diagnosis not present

## 2020-02-04 DIAGNOSIS — N319 Neuromuscular dysfunction of bladder, unspecified: Secondary | ICD-10-CM | POA: Diagnosis not present

## 2020-02-04 DIAGNOSIS — Z466 Encounter for fitting and adjustment of urinary device: Secondary | ICD-10-CM | POA: Diagnosis not present

## 2020-02-04 DIAGNOSIS — Z89521 Acquired absence of right knee: Secondary | ICD-10-CM | POA: Diagnosis not present

## 2020-02-04 DIAGNOSIS — K592 Neurogenic bowel, not elsewhere classified: Secondary | ICD-10-CM | POA: Diagnosis not present

## 2020-02-04 DIAGNOSIS — G40909 Epilepsy, unspecified, not intractable, without status epilepticus: Secondary | ICD-10-CM | POA: Diagnosis not present

## 2020-02-04 DIAGNOSIS — Z89512 Acquired absence of left leg below knee: Secondary | ICD-10-CM | POA: Diagnosis not present

## 2020-02-05 ENCOUNTER — Telehealth: Payer: Self-pay | Admitting: Orthopedic Surgery

## 2020-02-05 ENCOUNTER — Telehealth: Payer: Self-pay

## 2020-02-05 ENCOUNTER — Other Ambulatory Visit: Payer: Self-pay | Admitting: Physician Assistant

## 2020-02-05 ENCOUNTER — Encounter: Payer: Self-pay | Admitting: Family

## 2020-02-05 ENCOUNTER — Other Ambulatory Visit: Payer: Self-pay

## 2020-02-05 DIAGNOSIS — T8781 Dehiscence of amputation stump: Secondary | ICD-10-CM | POA: Diagnosis not present

## 2020-02-05 DIAGNOSIS — K592 Neurogenic bowel, not elsewhere classified: Secondary | ICD-10-CM | POA: Diagnosis not present

## 2020-02-05 DIAGNOSIS — N319 Neuromuscular dysfunction of bladder, unspecified: Secondary | ICD-10-CM | POA: Diagnosis not present

## 2020-02-05 DIAGNOSIS — G8250 Quadriplegia, unspecified: Secondary | ICD-10-CM | POA: Diagnosis not present

## 2020-02-05 DIAGNOSIS — L89220 Pressure ulcer of left hip, unstageable: Secondary | ICD-10-CM | POA: Diagnosis not present

## 2020-02-05 DIAGNOSIS — E440 Moderate protein-calorie malnutrition: Secondary | ICD-10-CM | POA: Diagnosis not present

## 2020-02-05 MED ORDER — DOXYCYCLINE HYCLATE 100 MG PO CAPS: 100.0000 mg | ORAL_CAPSULE | Freq: Two times a day (BID) | ORAL | 0 refills | Status: DC

## 2020-02-05 NOTE — Telephone Encounter (Signed)
I called and lm on vm for the pt to advise that the [facility] called today with concerns of redness at the site and an ABX was called in for him due to increased signs of infection. Dr. Lajoyce Corners is very conservative with surgery and would only make that recommendation if that was the last alternative. I advised he would be happy to discuss with pt and he can call if he would like to do this or has any other questions.

## 2020-02-05 NOTE — Telephone Encounter (Signed)
Called and lm on vm to advise to call back and whom ever answers the phone he can leave a detailed message so that I can relay tht to the provider and call back with answers.
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
## 2020-02-05 NOTE — Telephone Encounter (Signed)
I called and lm on vm to advise that rx has been sent to pharm.  

## 2020-02-05 NOTE — Telephone Encounter (Signed)
Patient called. He would like to speak with someone about his leg. His call back number [phone]

## 2020-02-05 NOTE — Telephone Encounter (Signed)
HHN called and states that the wound BKA is red and concerned about infection. Advised that we can call in Doxy bid per NP and that  The pt is sch for a revision BKA on Wednesday to monitor for any other changes and I will call the pt and notify of the ABX

## 2020-02-05 NOTE — Telephone Encounter (Signed)
Patient called back. Says the nurses that sees him says that his leg does not need to be amputated and that there is an antibiodic  that he can take. He would like to talk to someone about it. His call back number is [phone]

## 2020-02-06 ENCOUNTER — Other Ambulatory Visit (HOSPITAL_COMMUNITY)
Admission: RE | Admit: 2020-02-06 | Discharge: 2020-02-06 | Disposition: A | Discharge status: Home / Self Care | Payer: Medicare Other | Source: Ambulatory Visit | Attending: Orthopedic Surgery | Admitting: Orthopedic Surgery

## 2020-02-06 DIAGNOSIS — Z20822 Contact with and (suspected) exposure to covid-19: Secondary | ICD-10-CM | POA: Insufficient documentation

## 2020-02-06 DIAGNOSIS — Z01812 Encounter for preprocedural laboratory examination: Secondary | ICD-10-CM | POA: Diagnosis not present

## 2020-02-06 LAB — SARS CORONAVIRUS 2 (TAT 6-24 HRS): SARS Coronavirus 2: NEGATIVE

## 2020-02-08 ENCOUNTER — Telehealth: Payer: Self-pay | Admitting: Orthopedic Surgery

## 2020-02-08 DIAGNOSIS — K592 Neurogenic bowel, not elsewhere classified: Secondary | ICD-10-CM | POA: Diagnosis not present

## 2020-02-08 DIAGNOSIS — G8250 Quadriplegia, unspecified: Secondary | ICD-10-CM | POA: Diagnosis not present

## 2020-02-08 DIAGNOSIS — T8781 Dehiscence of amputation stump: Secondary | ICD-10-CM | POA: Diagnosis not present

## 2020-02-08 DIAGNOSIS — L89220 Pressure ulcer of left hip, unstageable: Secondary | ICD-10-CM | POA: Diagnosis not present

## 2020-02-08 DIAGNOSIS — N319 Neuromuscular dysfunction of bladder, unspecified: Secondary | ICD-10-CM | POA: Diagnosis not present

## 2020-02-08 DIAGNOSIS — E440 Moderate protein-calorie malnutrition: Secondary | ICD-10-CM | POA: Diagnosis not present

## 2020-02-08 NOTE — Telephone Encounter (Signed)
I called and sw pt's mother that an ABX had been called in onfriday and that I had left two voicemail's to notify the pt and also advised the HHN. Per Dr. Lajoyce Corners if they are able to pick it up and start it today that would be good. Pt's POA voiced understanding and will call with any other questions.

## 2020-02-08 NOTE — Telephone Encounter (Signed)
Patient's mother [name] called asked if dr Lajoyce Corners can give her a call concerning patient's leg. [name] asked if patient should have been given some antibiotics. The number to contact [name] is [phone]

## 2020-02-09 ENCOUNTER — Telehealth: Payer: Self-pay

## 2020-02-09 ENCOUNTER — Encounter (HOSPITAL_COMMUNITY): Payer: Self-pay | Admitting: Orthopedic Surgery

## 2020-02-09 ENCOUNTER — Other Ambulatory Visit: Payer: Self-pay

## 2020-02-09 NOTE — Progress Notes (Signed)
Anesthesia Chart Review: Same day workup  Medical history significant for history of cognitive impairment s/p TBI, complete C7 spinal cord injury, spastic quadriparesis, neurogenic bowel/bladder, seizure disorder, bilateral BKA . Recently underwent revision of L BKA 01/01/20 due to pressure ulcer, now requires AKA due to issues healing.   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 Last seen 12/21/19. Recommended continue depakote and f/u in 8 months.   Will need DOS labs and eval.   EKG 06/25/19: Sinus rhythm. Rate 73.Borderline short PR interval   Zannie Cove [facility] Short Stay Center/Anesthesiology Phone [phone] 02/09/2020 2:20 PM

## 2020-02-09 NOTE — Progress Notes (Signed)
Pt denies SOB, chest pain, and being under the care of a cardiologist. Pt stated that Dr. Gordy Savers is his PCP. Pt denies having a stress test, echo and cardiac cath. Pt denies having a chest x ray in the last year. Pt denies recent labs. Pt made aware to stop taking Aspirin (unless otherwise advised by surgeon), vitamins, fish oil and herbal medications. Do not take any NSAIDs ie: Ibuprofen, Advil, Naproxen (Aleve), Motrin, BC and Goody Powder. Pt reminded to quarantine. Pt verbalized understanding of all pre-op instructions. Pt mother, Aurea Graff [facility]) also provided with pre-op instructions.

## 2020-02-09 NOTE — Anesthesia Preprocedure Evaluation (Addendum)
Anesthesia Evaluation  Patient identified by MRN, date of birth, ID band Patient awake    Reviewed: Allergy & Precautions, NPO status , Patient's Chart, lab work & pertinent test results  History of Anesthesia Complications Negative for: history of anesthetic complications  Airway Mallampati: III  TM Distance: >3 FB Neck ROM: Full  Mouth opening: Limited Mouth Opening  Dental  (+) Dental Advisory Given   Pulmonary Current Smoker and Patient abstained from smoking.,    breath sounds clear to auscultation       Cardiovascular + Peripheral Vascular Disease   Rhythm:Regular     Neuro/Psych Seizures -,  Complete c7, tbi negative psych ROS   GI/Hepatic negative GI ROS, Neg liver ROS,   Endo/Other  negative endocrine ROS  Renal/GU negative Renal ROS     Musculoskeletal   Abdominal   Peds  Hematology  (+) Blood dyscrasia, anemia ,   Anesthesia Other Findings   Reproductive/Obstetrics                            Anesthesia Physical Anesthesia Plan  ASA: III  Anesthesia Plan: General   Post-op Pain Management:    Induction: Intravenous  PONV Risk Score and Plan: 1 and Treatment may vary due to age or medical condition and Ondansetron  Airway Management Planned: Oral ETT  Additional Equipment: None  Intra-op Plan:   Post-operative Plan: Extubation in OR  Informed Consent: I have reviewed the patients History and Physical, chart, labs and discussed the procedure including the risks, benefits and alternatives for the proposed anesthesia with the patient or authorized representative who has indicated his/her understanding and acceptance.     Dental advisory given  Plan Discussed with: CRNA and Surgeon  Anesthesia Plan Comments: (Medical history significant for history of cognitive impairment s/p TBI, complete C7 spinal cord injury, spastic quadriparesis, neurogenic bowel/bladder,  seizure disorder, bilateral BKA . Recently underwent revision of L BKA 01/01/20 due to pressure ulcer, now requires AKA due to issues healing.   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 Last seen 12/21/19. Recommended continue depakote and f/u in 8 months.   Will need DOS labs and eval.   EKG 06/25/19: Sinus rhythm. Rate 73.Borderline short PR interval )       Anesthesia Quick Evaluation

## 2020-02-09 NOTE — Telephone Encounter (Signed)
Patient called stating that he found another medical supplier for cath. [employer] [phone]

## 2020-02-10 ENCOUNTER — Ambulatory Visit (HOSPITAL_COMMUNITY): Payer: Medicare Other | Admitting: Physician Assistant

## 2020-02-10 ENCOUNTER — Encounter (HOSPITAL_COMMUNITY): Payer: Self-pay | Admitting: Orthopedic Surgery

## 2020-02-10 ENCOUNTER — Encounter (HOSPITAL_COMMUNITY)
Admission: RE | Disposition: A | Discharge status: Home / Self Care | Payer: Self-pay | Source: Home / Self Care | Attending: Orthopedic Surgery

## 2020-02-10 ENCOUNTER — Ambulatory Visit (HOSPITAL_COMMUNITY)
Admission: RE | Admit: 2020-02-10 | Discharge: 2020-02-10 | Disposition: A | Discharge status: Home / Self Care | Payer: Medicare Other | Attending: Orthopedic Surgery | Admitting: Orthopedic Surgery

## 2020-02-10 ENCOUNTER — Other Ambulatory Visit: Payer: Self-pay

## 2020-02-10 DIAGNOSIS — G40909 Epilepsy, unspecified, not intractable, without status epilepticus: Secondary | ICD-10-CM | POA: Diagnosis not present

## 2020-02-10 DIAGNOSIS — I70248 Atherosclerosis of native arteries of left leg with ulceration of other part of lower left leg: Secondary | ICD-10-CM | POA: Diagnosis not present

## 2020-02-10 DIAGNOSIS — S14107S Unspecified injury at C7 level of cervical spinal cord, sequela: Secondary | ICD-10-CM | POA: Diagnosis not present

## 2020-02-10 DIAGNOSIS — D649 Anemia, unspecified: Secondary | ICD-10-CM | POA: Diagnosis not present

## 2020-02-10 DIAGNOSIS — G8250 Quadriplegia, unspecified: Secondary | ICD-10-CM | POA: Insufficient documentation

## 2020-02-10 DIAGNOSIS — Z79899 Other long term (current) drug therapy: Secondary | ICD-10-CM | POA: Diagnosis not present

## 2020-02-10 DIAGNOSIS — L97829 Non-pressure chronic ulcer of other part of left lower leg with unspecified severity: Secondary | ICD-10-CM | POA: Insufficient documentation

## 2020-02-10 DIAGNOSIS — D62 Acute posthemorrhagic anemia: Secondary | ICD-10-CM | POA: Diagnosis not present

## 2020-02-10 DIAGNOSIS — Y835 Amputation of limb(s) as the cause of abnormal reaction of the patient, or of later complication, without mention of misadventure at the time of the procedure: Secondary | ICD-10-CM | POA: Diagnosis not present

## 2020-02-10 DIAGNOSIS — F17210 Nicotine dependence, cigarettes, uncomplicated: Secondary | ICD-10-CM | POA: Diagnosis not present

## 2020-02-10 DIAGNOSIS — Z89511 Acquired absence of right leg below knee: Secondary | ICD-10-CM | POA: Insufficient documentation

## 2020-02-10 DIAGNOSIS — T8781 Dehiscence of amputation stump: Secondary | ICD-10-CM | POA: Diagnosis not present

## 2020-02-10 DIAGNOSIS — X58XXXS Exposure to other specified factors, sequela: Secondary | ICD-10-CM | POA: Diagnosis not present

## 2020-02-10 HISTORY — DX: Disruption of wound, unspecified, initial encounter: T81.30XA

## 2020-02-10 HISTORY — PX: AMPUTATION: SHX166

## 2020-02-10 LAB — BASIC METABOLIC PANEL
Anion gap: 12 (ref 5–15)
BUN: 24 mg/dL — ABNORMAL HIGH (ref 6–20)
CO2: 25 mmol/L (ref 22–32)
Calcium: 8.6 mg/dL — ABNORMAL LOW (ref 8.9–10.3)
Chloride: 98 mmol/L (ref 98–111)
Creatinine, Ser: 0.92 mg/dL (ref 0.61–1.24)
GFR calc Af Amer: >60 mL/min (ref >60)
GFR calc non Af Amer: >60 mL/min (ref >60)
Glucose, Bld: 78 mg/dL (ref 70–99)
Potassium: 4.2 mmol/L (ref 3.5–5.1)
Sodium: 135 mmol/L (ref 135–145)

## 2020-02-10 LAB — CBC
HCT: 35.9 % — ABNORMAL LOW (ref 39.0–52.0)
Hemoglobin: 10.9 g/dL — ABNORMAL LOW (ref 13.0–17.0)
MCH: 29.6 pg (ref 26.0–34.0)
MCHC: 30.4 g/dL (ref 30.0–36.0)
MCV: 97.6 fL (ref 80.0–100.0)
Platelets: 366 10*3/uL (ref 150–400)
RBC: 3.68 MIL/uL — ABNORMAL LOW (ref 4.22–5.81)
RDW: 14.7 % (ref 11.5–15.5)
WBC: 8.4 10*3/uL (ref 4.0–10.5)
nRBC: 0.2 % (ref 0.0–0.2)

## 2020-02-10 SURGERY — AMPUTATION, ABOVE KNEE
Anesthesia: General | Site: Knee | Laterality: Left

## 2020-02-10 MED ORDER — LIDOCAINE 2% (20 MG/ML) 5 ML SYRINGE
INTRAMUSCULAR | Status: AC
  Filled 2020-02-10: qty 5

## 2020-02-10 MED ORDER — OXYCODONE HCL 5 MG/5ML PO SOLN: 5.0000 mg | Freq: Once | ORAL | Status: DC | PRN

## 2020-02-10 MED ORDER — FENTANYL CITRATE (PF) 250 MCG/5ML IJ SOLN
INTRAMUSCULAR | Status: AC
  Filled 2020-02-10: qty 5

## 2020-02-10 MED ORDER — OXYCODONE HCL 5 MG PO TABS: 5.0000 mg | ORAL_TABLET | ORAL | 0 refills | Status: DC | PRN

## 2020-02-10 MED ORDER — SUGAMMADEX SODIUM 200 MG/2ML IV SOLN
INTRAVENOUS | Status: DC | PRN
  Administered 2020-02-10: 200 mg via INTRAVENOUS

## 2020-02-10 MED ORDER — PHENYLEPHRINE 40 MCG/ML (10ML) SYRINGE FOR IV PUSH (FOR BLOOD PRESSURE SUPPORT)
PREFILLED_SYRINGE | INTRAVENOUS | Status: DC | PRN
  Administered 2020-02-10: 200 ug via INTRAVENOUS
  Administered 2020-02-10: 120 ug via INTRAVENOUS

## 2020-02-10 MED ORDER — FENTANYL CITRATE (PF) 250 MCG/5ML IJ SOLN
INTRAMUSCULAR | Status: DC | PRN
  Administered 2020-02-10: 50 ug via INTRAVENOUS
  Administered 2020-02-10: 100 ug via INTRAVENOUS
  Administered 2020-02-10: 50 ug via INTRAVENOUS

## 2020-02-10 MED ORDER — LACTATED RINGERS IV SOLN: INTRAVENOUS | Status: DC | PRN

## 2020-02-10 MED ORDER — PROPOFOL 10 MG/ML IV BOLUS
INTRAVENOUS | Status: DC | PRN
  Administered 2020-02-10: 110 mg via INTRAVENOUS

## 2020-02-10 MED ORDER — OXYCODONE HCL 5 MG PO TABS: 5.0000 mg | ORAL_TABLET | Freq: Once | ORAL | Status: DC | PRN

## 2020-02-10 MED ORDER — ONDANSETRON HCL 4 MG/2ML IJ SOLN
INTRAMUSCULAR | Status: DC | PRN
  Administered 2020-02-10: 4 mg via INTRAVENOUS

## 2020-02-10 MED ORDER — LIDOCAINE HCL (CARDIAC) PF 100 MG/5ML IV SOSY
PREFILLED_SYRINGE | INTRAVENOUS | Status: DC | PRN
  Administered 2020-02-10: 60 mg via INTRATRACHEAL

## 2020-02-10 MED ORDER — ROCURONIUM BROMIDE 100 MG/10ML IV SOLN
INTRAVENOUS | Status: DC | PRN
  Administered 2020-02-10: 50 mg via INTRAVENOUS

## 2020-02-10 MED ORDER — PROPOFOL 10 MG/ML IV BOLUS
INTRAVENOUS | Status: AC
  Filled 2020-02-10: qty 20

## 2020-02-10 MED ORDER — ACETAMINOPHEN 10 MG/ML IV SOLN: 1000.0000 mg | Freq: Once | INTRAVENOUS | Status: DC | PRN

## 2020-02-10 MED ORDER — ROCURONIUM BROMIDE 10 MG/ML (PF) SYRINGE
PREFILLED_SYRINGE | INTRAVENOUS | Status: AC
  Filled 2020-02-10: qty 10

## 2020-02-10 MED ORDER — DEXAMETHASONE SODIUM PHOSPHATE 10 MG/ML IJ SOLN
INTRAMUSCULAR | Status: DC | PRN
  Administered 2020-02-10: 10 mg via INTRAVENOUS

## 2020-02-10 MED ORDER — MIDAZOLAM HCL 2 MG/2ML IJ SOLN
INTRAMUSCULAR | Status: DC | PRN
  Administered 2020-02-10: 2 mg via INTRAVENOUS

## 2020-02-10 MED ORDER — 0.9 % SODIUM CHLORIDE (POUR BTL) OPTIME
TOPICAL | Status: DC | PRN
  Administered 2020-02-10: 1000 mL

## 2020-02-10 MED ORDER — CEFAZOLIN SODIUM-DEXTROSE 2-4 GM/100ML-% IV SOLN
2.0000 g | INTRAVENOUS | Status: AC
  Administered 2020-02-10: 2 g via INTRAVENOUS
  Filled 2020-02-10: qty 100

## 2020-02-10 MED ORDER — ACETAMINOPHEN 500 MG PO TABS: 1000.0000 mg | ORAL_TABLET | Freq: Once | ORAL | Status: DC | PRN

## 2020-02-10 MED ORDER — FENTANYL CITRATE (PF) 100 MCG/2ML IJ SOLN: 25.0000 ug | INTRAMUSCULAR | Status: DC | PRN

## 2020-02-10 MED ORDER — ACETAMINOPHEN 160 MG/5ML PO SOLN: 1000.0000 mg | Freq: Once | ORAL | Status: DC | PRN

## 2020-02-10 MED ORDER — MIDAZOLAM HCL 2 MG/2ML IJ SOLN
INTRAMUSCULAR | Status: AC
  Filled 2020-02-10: qty 2

## 2020-02-10 SURGICAL SUPPLY — 41 items
BLADE SAW RECIP 87.9 MT (BLADE) ×3 IMPLANT
BLADE SURG 21 STRL SS (BLADE) ×3 IMPLANT
BNDG COHESIVE 6X5 TAN STRL LF (GAUZE/BANDAGES/DRESSINGS) ×3 IMPLANT
CANISTER WOUND CARE 500ML ATS (WOUND CARE) IMPLANT
COVER SURGICAL LIGHT HANDLE (MISCELLANEOUS) ×3 IMPLANT
COVER WAND RF STERILE (DRAPES) IMPLANT
CUFF TOURN SGL QUICK 34 (TOURNIQUET CUFF)
CUFF TRNQT CYL 34X4.125X (TOURNIQUET CUFF) IMPLANT
DRAPE INCISE IOBAN 66X45 STRL (DRAPES) ×4 IMPLANT
DRAPE U-SHAPE 47X51 STRL (DRAPES) ×3 IMPLANT
DRESSING PREVENA PLUS CUSTOM (GAUZE/BANDAGES/DRESSINGS) ×1 IMPLANT
DRSG PREVENA PLUS CUSTOM (GAUZE/BANDAGES/DRESSINGS) ×3
DURAPREP 26ML APPLICATOR (WOUND CARE) ×3 IMPLANT
ELECT REM PT RETURN 9FT ADLT (ELECTROSURGICAL) ×3
ELECTRODE REM PT RTRN 9FT ADLT (ELECTROSURGICAL) ×1 IMPLANT
GLOVE BIOGEL PI IND STRL 7.5 (GLOVE) ×1 IMPLANT
GLOVE BIOGEL PI IND STRL 9 (GLOVE) ×1 IMPLANT
GLOVE BIOGEL PI INDICATOR 7.5 (GLOVE) ×2
GLOVE BIOGEL PI INDICATOR 9 (GLOVE) ×2
GLOVE SURG ORTHO 9.0 STRL STRW (GLOVE) ×3 IMPLANT
GLOVE SURG SS PI 6.5 STRL IVOR (GLOVE) ×3 IMPLANT
GOWN STRL REUS W/ TWL LRG LVL3 (GOWN DISPOSABLE) ×1 IMPLANT
GOWN STRL REUS W/ TWL XL LVL3 (GOWN DISPOSABLE) ×2 IMPLANT
GOWN STRL REUS W/TWL LRG LVL3 (GOWN DISPOSABLE) ×3
GOWN STRL REUS W/TWL XL LVL3 (GOWN DISPOSABLE) ×12
KIT BASIN OR (CUSTOM PROCEDURE TRAY) ×3 IMPLANT
KIT TURNOVER KIT B (KITS) ×3 IMPLANT
MANIFOLD NEPTUNE II (INSTRUMENTS) ×3 IMPLANT
NS IRRIG 1000ML POUR BTL (IV SOLUTION) ×3 IMPLANT
PACK ORTHO EXTREMITY (CUSTOM PROCEDURE TRAY) ×3 IMPLANT
PAD ARMBOARD 7.5X6 YLW CONV (MISCELLANEOUS) ×3 IMPLANT
PREVENA RESTOR ARTHOFORM 46X30 (CANNISTER) ×3 IMPLANT
STAPLER VISISTAT 35W (STAPLE) ×3 IMPLANT
STOCKINETTE IMPERVIOUS LG (DRAPES) ×2 IMPLANT
SUT ETHILON 2 0 PSLX (SUTURE) ×6 IMPLANT
SUT SILK 2 0 (SUTURE) ×3
SUT SILK 2-0 18XBRD TIE 12 (SUTURE) ×1 IMPLANT
TOWEL GREEN STERILE FF (TOWEL DISPOSABLE) ×3 IMPLANT
TUBE CONNECTING 20'X1/4 (TUBING) ×1
TUBE CONNECTING 20X1/4 (TUBING) ×2 IMPLANT
YANKAUER SUCT BULB TIP NO VENT (SUCTIONS) ×3 IMPLANT

## 2020-02-10 NOTE — Discharge Instructions (Signed)
DISCHARGE PLANNING:  Antibiotic duration: Preoperative antibiotics  Weightbearing: Nonweightbearing on the left  Pain medication: As needed  Dressing care/ Wound VAC: Continue wound VAC for 1 week  Ambulatory devices: Not applicable  Discharge to: Home.  Follow-up: In the office 1 week post operative.
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
## 2020-02-10 NOTE — Op Note (Signed)
02/10/2020  9:25 AM  PATIENT:  [name]    PRE-OPERATIVE DIAGNOSIS:  Dehiscence Left Below Knee Amputation   POST-OPERATIVE DIAGNOSIS:  Same  PROCEDURE:  LEFT ABOVE KNEE AMPUTATION Application Prevena customizable and Arthur form wound VAC   SURGEON:  [name], MD  PHYSICIAN ASSISTANT:None ANESTHESIA:   General  PREOPERATIVE INDICATIONS:  [name] is a  49 y.o. male with a diagnosis of Dehiscence Left Below Knee Amputation who failed conservative measures and elected for surgical management.    The risks benefits and alternatives were discussed with the patient preoperatively including but not limited to the risks of infection, bleeding, nerve injury, cardiopulmonary complications, the need for revision surgery, among others, and the patient was willing to proceed.  OPERATIVE IMPLANTS: Praveena wound VAC sponges x2  @ENCIMAGES @  OPERATIVE FINDINGS: Good bleeding contractile muscle at the amputation site  OPERATIVE PROCEDURE: Patient was brought the operating room and underwent a general anesthetic.  After adequate levels anesthesia were obtained patient's left lower extremity was prepped using DuraPrep draped into a sterile field a timeout was called.  The dehisced residual limb was wrapped with impervious stockinette and wrapped out of the surgical field.  A fishmouth incision was made just proximal to the quad tendon.  This was carried down to the intermuscular septum medially the vascular bundle was clamped and suture ligated with 2-0 silk.  The amputation was completed a reciprocating saw was used to perform the amputation through the distal femur.  Electrocautery was used to perform further hemostasis.  The deep and superficial fascia layers were closed using #1 Vicryl the skin was closed using 2-0 nylon and staples.  The Praveena customizable and form wound VAC was applied this had a good suction fit patient was extubated taken the PACU in  stable condition   DISCHARGE PLANNING:  Antibiotic duration: Preoperative antibiotics  Weightbearing: Nonweightbearing on the left  Pain medication: As needed  Dressing care/ Wound VAC: Continue wound VAC for 1 week  Ambulatory devices: Not applicable  Discharge to: Home.  Follow-up: In the office 1 week post operative.

## 2020-02-10 NOTE — Transfer of Care (Signed)
Immediate Anesthesia Transfer of Care Note  Patient: [name]  Procedure(s) Performed: LEFT ABOVE KNEE AMPUTATION (Left Knee)  Patient Location: PACU  Anesthesia Type:General  Level of Consciousness: drowsy and patient cooperative  Airway & Oxygen Therapy: Patient Spontanous Breathing  Post-op Assessment: Report given to RN and Post -op Vital signs reviewed and stable  Post vital signs: Reviewed and stable  Last Vitals:  Vitals Value Taken Time  BP 128/87 02/10/20 0926  Temp    Pulse 95 02/10/20 0927  Resp 14 02/10/20 0927  SpO2 99 % 02/10/20 0927  Vitals shown include unvalidated device data.  Last Pain:  Vitals:   02/10/20 0701  PainSc: 0-No pain      Patients Stated Pain Goal: 5 (02/10/20 0701)  Complications: No apparent anesthesia complications

## 2020-02-10 NOTE — Anesthesia Procedure Notes (Signed)
Procedure Name: Intubation Date/Time: 02/10/2020 8:47 AM Performed by: Rosiland Oz, CRNA Pre-anesthesia Checklist: Patient identified, Emergency Drugs available, Suction available, Patient being monitored and Timeout performed Patient Re-evaluated:Patient Re-evaluated prior to induction Oxygen Delivery Method: Circle system utilized Preoxygenation: Pre-oxygenation with 100% oxygen Induction Type: IV induction Ventilation: Mask ventilation without difficulty Laryngoscope Size: Miller and 3 Grade View: Grade I Tube type: Oral Tube size: 7.5 mm Number of attempts: 1 Airway Equipment and Method: Stylet Placement Confirmation: ETT inserted through vocal cords under direct vision,  positive ETCO2 and breath sounds checked- equal and bilateral Secured at: 22 cm Tube secured with: Tape Dental Injury: Teeth and Oropharynx as per pre-operative assessment

## 2020-02-11 DIAGNOSIS — K592 Neurogenic bowel, not elsewhere classified: Secondary | ICD-10-CM | POA: Diagnosis not present

## 2020-02-11 DIAGNOSIS — N319 Neuromuscular dysfunction of bladder, unspecified: Secondary | ICD-10-CM | POA: Diagnosis not present

## 2020-02-11 DIAGNOSIS — E440 Moderate protein-calorie malnutrition: Secondary | ICD-10-CM | POA: Diagnosis not present

## 2020-02-11 DIAGNOSIS — G8250 Quadriplegia, unspecified: Secondary | ICD-10-CM | POA: Diagnosis not present

## 2020-02-11 DIAGNOSIS — T8781 Dehiscence of amputation stump: Secondary | ICD-10-CM | POA: Diagnosis not present

## 2020-02-11 DIAGNOSIS — L89220 Pressure ulcer of left hip, unstageable: Secondary | ICD-10-CM | POA: Diagnosis not present

## 2020-02-12 DIAGNOSIS — G8250 Quadriplegia, unspecified: Secondary | ICD-10-CM | POA: Diagnosis not present

## 2020-02-12 DIAGNOSIS — E440 Moderate protein-calorie malnutrition: Secondary | ICD-10-CM | POA: Diagnosis not present

## 2020-02-12 DIAGNOSIS — N319 Neuromuscular dysfunction of bladder, unspecified: Secondary | ICD-10-CM | POA: Diagnosis not present

## 2020-02-12 DIAGNOSIS — K592 Neurogenic bowel, not elsewhere classified: Secondary | ICD-10-CM | POA: Diagnosis not present

## 2020-02-12 DIAGNOSIS — T8781 Dehiscence of amputation stump: Secondary | ICD-10-CM | POA: Diagnosis not present

## 2020-02-12 DIAGNOSIS — L89220 Pressure ulcer of left hip, unstageable: Secondary | ICD-10-CM | POA: Diagnosis not present

## 2020-02-12 LAB — SURGICAL PATHOLOGY

## 2020-02-15 DIAGNOSIS — G8250 Quadriplegia, unspecified: Secondary | ICD-10-CM | POA: Diagnosis not present

## 2020-02-15 DIAGNOSIS — E440 Moderate protein-calorie malnutrition: Secondary | ICD-10-CM | POA: Diagnosis not present

## 2020-02-15 DIAGNOSIS — L89220 Pressure ulcer of left hip, unstageable: Secondary | ICD-10-CM | POA: Diagnosis not present

## 2020-02-15 DIAGNOSIS — T8781 Dehiscence of amputation stump: Secondary | ICD-10-CM | POA: Diagnosis not present

## 2020-02-15 DIAGNOSIS — N319 Neuromuscular dysfunction of bladder, unspecified: Secondary | ICD-10-CM | POA: Diagnosis not present

## 2020-02-15 DIAGNOSIS — K592 Neurogenic bowel, not elsewhere classified: Secondary | ICD-10-CM | POA: Diagnosis not present

## 2020-02-15 NOTE — Anesthesia Postprocedure Evaluation (Signed)
Anesthesia Post Note  Patient: [name]  Procedure(s) Performed: LEFT ABOVE KNEE AMPUTATION (Left Knee)     Patient location during evaluation: PACU Anesthesia Type: General Level of consciousness: awake and alert Pain management: pain level controlled Vital Signs Assessment: post-procedure vital signs reviewed and stable Respiratory status: spontaneous breathing, nonlabored ventilation, respiratory function stable and patient connected to nasal cannula oxygen Cardiovascular status: stable and blood pressure returned to baseline Postop Assessment: no apparent nausea or vomiting Anesthetic complications: no    Last Vitals:  Vitals:   02/10/20 0940 02/10/20 0955  BP: 109/67 103/64  Pulse: 97 89  Resp: (!) 22 18  Temp:  36.6 C  SpO2: 98% 98%    Last Pain:  Vitals:   02/10/20 0955  PainSc: 0-No pain                 Adit Riddles

## 2020-02-17 ENCOUNTER — Ambulatory Visit (INDEPENDENT_AMBULATORY_CARE_PROVIDER_SITE_OTHER): Payer: Medicare Other | Admitting: Physician Assistant

## 2020-02-17 ENCOUNTER — Other Ambulatory Visit: Payer: Self-pay

## 2020-02-17 ENCOUNTER — Encounter: Payer: Self-pay | Admitting: Physician Assistant

## 2020-02-17 VITALS — Ht 60.0 in | Wt 125.0 lb

## 2020-02-17 DIAGNOSIS — T8781 Dehiscence of amputation stump: Secondary | ICD-10-CM

## 2020-02-17 DIAGNOSIS — G8250 Quadriplegia, unspecified: Secondary | ICD-10-CM | POA: Diagnosis not present

## 2020-02-17 DIAGNOSIS — L89220 Pressure ulcer of left hip, unstageable: Secondary | ICD-10-CM | POA: Diagnosis not present

## 2020-02-17 DIAGNOSIS — E440 Moderate protein-calorie malnutrition: Secondary | ICD-10-CM | POA: Diagnosis not present

## 2020-02-17 DIAGNOSIS — N319 Neuromuscular dysfunction of bladder, unspecified: Secondary | ICD-10-CM | POA: Diagnosis not present

## 2020-02-17 DIAGNOSIS — K592 Neurogenic bowel, not elsewhere classified: Secondary | ICD-10-CM | POA: Diagnosis not present

## 2020-02-17 MED ORDER — METHOCARBAMOL 500 MG PO TABS: 500.0000 mg | ORAL_TABLET | Freq: Four times a day (QID) | ORAL | 0 refills | Status: DC | PRN
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
## 2020-02-19 DIAGNOSIS — N319 Neuromuscular dysfunction of bladder, unspecified: Secondary | ICD-10-CM | POA: Diagnosis not present

## 2020-02-19 DIAGNOSIS — K592 Neurogenic bowel, not elsewhere classified: Secondary | ICD-10-CM | POA: Diagnosis not present

## 2020-02-19 DIAGNOSIS — T8781 Dehiscence of amputation stump: Secondary | ICD-10-CM | POA: Diagnosis not present

## 2020-02-19 DIAGNOSIS — E440 Moderate protein-calorie malnutrition: Secondary | ICD-10-CM | POA: Diagnosis not present

## 2020-02-19 DIAGNOSIS — L89220 Pressure ulcer of left hip, unstageable: Secondary | ICD-10-CM | POA: Diagnosis not present

## 2020-02-19 DIAGNOSIS — G8250 Quadriplegia, unspecified: Secondary | ICD-10-CM | POA: Diagnosis not present

## 2020-02-24 ENCOUNTER — Encounter: Payer: Self-pay | Admitting: Physician Assistant

## 2020-02-24 ENCOUNTER — Other Ambulatory Visit: Payer: Self-pay

## 2020-02-24 ENCOUNTER — Ambulatory Visit (INDEPENDENT_AMBULATORY_CARE_PROVIDER_SITE_OTHER): Payer: Medicare HMO | Admitting: Physician Assistant

## 2020-02-24 VITALS — Ht 60.0 in | Wt 125.0 lb

## 2020-02-24 DIAGNOSIS — Z89612 Acquired absence of left leg above knee: Secondary | ICD-10-CM
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
## 2020-03-02 ENCOUNTER — Ambulatory Visit (INDEPENDENT_AMBULATORY_CARE_PROVIDER_SITE_OTHER): Payer: Medicare HMO | Admitting: Physician Assistant

## 2020-03-02 ENCOUNTER — Encounter: Payer: Self-pay | Admitting: Physician Assistant

## 2020-03-02 ENCOUNTER — Other Ambulatory Visit: Payer: Self-pay

## 2020-03-02 DIAGNOSIS — Z89612 Acquired absence of left leg above knee: Secondary | ICD-10-CM
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
## 2020-03-03 ENCOUNTER — Encounter: Payer: Self-pay | Admitting: Plastic Surgery

## 2020-03-03 ENCOUNTER — Ambulatory Visit (INDEPENDENT_AMBULATORY_CARE_PROVIDER_SITE_OTHER): Payer: Medicare HMO | Admitting: Plastic Surgery

## 2020-03-03 VITALS — BP 72/52 | HR 93 | Temp 97.1°F

## 2020-03-03 DIAGNOSIS — L89224 Pressure ulcer of left hip, stage 4: Secondary | ICD-10-CM | POA: Diagnosis not present

## 2020-03-03 NOTE — Progress Notes (Signed)
   Referring Provider No referring provider defined for this encounter.   CC:  Chief Complaint  Patient presents with  . Follow-up    wound not healing (L) hip      [name] is an 49 y.o. male.  HPI: Patient is 2 months out from a debridement of a left trochanteric pressure ulcer.  He has been getting the wound VAC and making slow progress.  He wants to check with the [facility] plastic surgery group to see if anything else could be done and he has that appointment coming up next week.  Review of Systems General: Denies fevers or chills  Physical Exam Vitals with BMI 03/03/2020 02/24/2020 02/17/2020  Height - 5\' 0"  5\' 0"   Weight (No Data) 125 lbs 125 lbs  BMI - 24.41 24.41  Systolic 72 - -  Diastolic 52 - -  Pulse 93 - -    General:  No acute distress,  Alert and oriented, Non-Toxic, Normal speech and affect On examination the wound is slowly contracting and and shows healthy tissue across the wound bed.  There is little bit of tunneling posteriorly and laterally but this appears to be closing down slowly.  Assessment/Plan Patient is showing slow signs of progress with wound VAC treatment of the left trochanteric pressure ulcer.  He is a smoker and in my opinion not a candidate for flap reconstruction.  I recommended continuing the wound VAC and he is amenable to that.  If the [facility] group ends up assuming care for him will discontinue follow-up with .  For the moment we will plan on seeing him again in 6 weeks.  30 minutes was spent with the patient doing wound care that includes taking his wound VAC off and putting it back on along with his face-to-face encounter with me.  [facility] 03/03/2020, 9:57 AM

## 2020-03-23 ENCOUNTER — Ambulatory Visit (INDEPENDENT_AMBULATORY_CARE_PROVIDER_SITE_OTHER): Payer: Medicare HMO | Admitting: Family

## 2020-03-23 ENCOUNTER — Other Ambulatory Visit: Payer: Self-pay

## 2020-03-23 ENCOUNTER — Ambulatory Visit: Payer: Medicare Other | Admitting: Plastic Surgery

## 2020-03-23 ENCOUNTER — Encounter: Payer: Self-pay | Admitting: Family

## 2020-03-23 VITALS — Ht 60.0 in | Wt 125.0 lb

## 2020-03-23 DIAGNOSIS — Z89612 Acquired absence of left leg above knee: Secondary | ICD-10-CM
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
## 2020-04-21 ENCOUNTER — Encounter: Payer: Self-pay | Admitting: Plastic Surgery

## 2020-04-21 ENCOUNTER — Ambulatory Visit (INDEPENDENT_AMBULATORY_CARE_PROVIDER_SITE_OTHER): Payer: Medicare Other | Admitting: Plastic Surgery

## 2020-04-21 ENCOUNTER — Other Ambulatory Visit: Payer: Self-pay

## 2020-04-21 VITALS — BP 128/76 | HR 60 | Temp 97.6°F

## 2020-04-21 DIAGNOSIS — L89224 Pressure ulcer of left hip, stage 4: Secondary | ICD-10-CM

## 2020-04-21 NOTE — Progress Notes (Signed)
   Referring Provider No referring provider defined for this encounter.   CC: No chief complaint on file. Left trochanteric pressure wound  [name] is an 49 y.o. male.  HPI: Patient presents in follow-up for left trochanteric pressure wound.  We have been managing this with a wound VAC after I initially debrided it back in March.  He feels like he is tolerating the wound VAC changes just fine and home health comes out 3 times a week to do it for him.  He was going to go to [facility] for an appointment for flap and missed his appointment.  Review of Systems General: Denies fevers or chills  Physical Exam Vitals with BMI 04/21/2020 03/23/2020 03/03/2020  Height - 5\' 0"  -  Weight - 125 lbs (No Data)  BMI - 24.41 -  Systolic 128 - 72  Diastolic 76 - 52  Pulse 60 - 93    General:  No acute distress,  Alert and oriented, Non-Toxic, Normal speech and affect On examination the wound bed looks healthy and it seems like the wound is closing in from the perimeter.  Assessment/Plan Patient presents in follow-up for a trochanteric pressure wound.  He is not a flap candidate because he still smoking.  We will continue with the wound VAC and he is content with that plan.  We changed his wound VAC today in the office.  I will plan to see him again in 2 months.  Over 30 minutes were spent with the patient discussing his care and changing his wound VAC.  04/21/2020, 9:39 AM

## 2020-04-22 DIAGNOSIS — L89224 Pressure ulcer of left hip, stage 4: Secondary | ICD-10-CM | POA: Diagnosis not present

## 2020-04-22 DIAGNOSIS — G40909 Epilepsy, unspecified, not intractable, without status epilepticus: Secondary | ICD-10-CM | POA: Diagnosis not present

## 2020-04-22 DIAGNOSIS — G8250 Quadriplegia, unspecified: Secondary | ICD-10-CM | POA: Diagnosis not present

## 2020-04-22 DIAGNOSIS — Z89521 Acquired absence of right knee: Secondary | ICD-10-CM | POA: Diagnosis not present

## 2020-04-22 DIAGNOSIS — Z466 Encounter for fitting and adjustment of urinary device: Secondary | ICD-10-CM | POA: Diagnosis not present

## 2020-04-22 DIAGNOSIS — E440 Moderate protein-calorie malnutrition: Secondary | ICD-10-CM | POA: Diagnosis not present

## 2020-04-22 DIAGNOSIS — K592 Neurogenic bowel, not elsewhere classified: Secondary | ICD-10-CM | POA: Diagnosis not present

## 2020-04-22 DIAGNOSIS — N319 Neuromuscular dysfunction of bladder, unspecified: Secondary | ICD-10-CM | POA: Diagnosis not present

## 2020-04-22 DIAGNOSIS — Z89512 Acquired absence of left leg below knee: Secondary | ICD-10-CM | POA: Diagnosis not present

## 2020-04-25 DIAGNOSIS — L89224 Pressure ulcer of left hip, stage 4: Secondary | ICD-10-CM | POA: Diagnosis not present

## 2020-04-25 DIAGNOSIS — N319 Neuromuscular dysfunction of bladder, unspecified: Secondary | ICD-10-CM | POA: Diagnosis not present

## 2020-04-25 DIAGNOSIS — E440 Moderate protein-calorie malnutrition: Secondary | ICD-10-CM | POA: Diagnosis not present

## 2020-04-25 DIAGNOSIS — G8250 Quadriplegia, unspecified: Secondary | ICD-10-CM | POA: Diagnosis not present

## 2020-04-25 DIAGNOSIS — K592 Neurogenic bowel, not elsewhere classified: Secondary | ICD-10-CM | POA: Diagnosis not present

## 2020-04-25 DIAGNOSIS — G40909 Epilepsy, unspecified, not intractable, without status epilepticus: Secondary | ICD-10-CM | POA: Diagnosis not present

## 2020-04-27 DIAGNOSIS — G8250 Quadriplegia, unspecified: Secondary | ICD-10-CM | POA: Diagnosis not present

## 2020-04-27 DIAGNOSIS — G40909 Epilepsy, unspecified, not intractable, without status epilepticus: Secondary | ICD-10-CM | POA: Diagnosis not present

## 2020-04-27 DIAGNOSIS — E440 Moderate protein-calorie malnutrition: Secondary | ICD-10-CM | POA: Diagnosis not present

## 2020-04-27 DIAGNOSIS — N319 Neuromuscular dysfunction of bladder, unspecified: Secondary | ICD-10-CM | POA: Diagnosis not present

## 2020-04-27 DIAGNOSIS — K592 Neurogenic bowel, not elsewhere classified: Secondary | ICD-10-CM | POA: Diagnosis not present

## 2020-04-27 DIAGNOSIS — L89224 Pressure ulcer of left hip, stage 4: Secondary | ICD-10-CM | POA: Diagnosis not present

## 2020-04-29 DIAGNOSIS — E440 Moderate protein-calorie malnutrition: Secondary | ICD-10-CM | POA: Diagnosis not present

## 2020-04-29 DIAGNOSIS — G8250 Quadriplegia, unspecified: Secondary | ICD-10-CM | POA: Diagnosis not present

## 2020-04-29 DIAGNOSIS — K592 Neurogenic bowel, not elsewhere classified: Secondary | ICD-10-CM | POA: Diagnosis not present

## 2020-04-29 DIAGNOSIS — L89224 Pressure ulcer of left hip, stage 4: Secondary | ICD-10-CM | POA: Diagnosis not present

## 2020-04-29 DIAGNOSIS — G40909 Epilepsy, unspecified, not intractable, without status epilepticus: Secondary | ICD-10-CM | POA: Diagnosis not present

## 2020-04-29 DIAGNOSIS — N319 Neuromuscular dysfunction of bladder, unspecified: Secondary | ICD-10-CM | POA: Diagnosis not present

## 2020-05-02 DIAGNOSIS — G40909 Epilepsy, unspecified, not intractable, without status epilepticus: Secondary | ICD-10-CM | POA: Diagnosis not present

## 2020-05-02 DIAGNOSIS — L89224 Pressure ulcer of left hip, stage 4: Secondary | ICD-10-CM | POA: Diagnosis not present

## 2020-05-02 DIAGNOSIS — K592 Neurogenic bowel, not elsewhere classified: Secondary | ICD-10-CM | POA: Diagnosis not present

## 2020-05-02 DIAGNOSIS — G8250 Quadriplegia, unspecified: Secondary | ICD-10-CM | POA: Diagnosis not present

## 2020-05-02 DIAGNOSIS — N319 Neuromuscular dysfunction of bladder, unspecified: Secondary | ICD-10-CM | POA: Diagnosis not present

## 2020-05-02 DIAGNOSIS — E440 Moderate protein-calorie malnutrition: Secondary | ICD-10-CM | POA: Diagnosis not present

## 2020-05-03 ENCOUNTER — Other Ambulatory Visit: Payer: Self-pay | Admitting: Neurology

## 2020-05-03 DIAGNOSIS — G40009 Localization-related (focal) (partial) idiopathic epilepsy and epileptic syndromes with seizures of localized onset, not intractable, without status epilepticus: Secondary | ICD-10-CM

## 2020-05-04 DIAGNOSIS — E440 Moderate protein-calorie malnutrition: Secondary | ICD-10-CM | POA: Diagnosis not present

## 2020-05-04 DIAGNOSIS — N319 Neuromuscular dysfunction of bladder, unspecified: Secondary | ICD-10-CM | POA: Diagnosis not present

## 2020-05-04 DIAGNOSIS — K592 Neurogenic bowel, not elsewhere classified: Secondary | ICD-10-CM | POA: Diagnosis not present

## 2020-05-04 DIAGNOSIS — G8250 Quadriplegia, unspecified: Secondary | ICD-10-CM | POA: Diagnosis not present

## 2020-05-04 DIAGNOSIS — G40909 Epilepsy, unspecified, not intractable, without status epilepticus: Secondary | ICD-10-CM | POA: Diagnosis not present

## 2020-05-04 DIAGNOSIS — L89224 Pressure ulcer of left hip, stage 4: Secondary | ICD-10-CM | POA: Diagnosis not present

## 2020-05-06 DIAGNOSIS — K592 Neurogenic bowel, not elsewhere classified: Secondary | ICD-10-CM | POA: Diagnosis not present

## 2020-05-06 DIAGNOSIS — E440 Moderate protein-calorie malnutrition: Secondary | ICD-10-CM | POA: Diagnosis not present

## 2020-05-06 DIAGNOSIS — G40909 Epilepsy, unspecified, not intractable, without status epilepticus: Secondary | ICD-10-CM | POA: Diagnosis not present

## 2020-05-06 DIAGNOSIS — G8250 Quadriplegia, unspecified: Secondary | ICD-10-CM | POA: Diagnosis not present

## 2020-05-06 DIAGNOSIS — L89224 Pressure ulcer of left hip, stage 4: Secondary | ICD-10-CM | POA: Diagnosis not present

## 2020-05-06 DIAGNOSIS — N319 Neuromuscular dysfunction of bladder, unspecified: Secondary | ICD-10-CM | POA: Diagnosis not present

## 2020-05-07 DIAGNOSIS — E440 Moderate protein-calorie malnutrition: Secondary | ICD-10-CM | POA: Diagnosis not present

## 2020-05-07 DIAGNOSIS — N319 Neuromuscular dysfunction of bladder, unspecified: Secondary | ICD-10-CM | POA: Diagnosis not present

## 2020-05-07 DIAGNOSIS — G8250 Quadriplegia, unspecified: Secondary | ICD-10-CM | POA: Diagnosis not present

## 2020-05-07 DIAGNOSIS — L89224 Pressure ulcer of left hip, stage 4: Secondary | ICD-10-CM | POA: Diagnosis not present

## 2020-05-07 DIAGNOSIS — G40909 Epilepsy, unspecified, not intractable, without status epilepticus: Secondary | ICD-10-CM | POA: Diagnosis not present

## 2020-05-07 DIAGNOSIS — K592 Neurogenic bowel, not elsewhere classified: Secondary | ICD-10-CM | POA: Diagnosis not present

## 2020-05-09 DIAGNOSIS — N319 Neuromuscular dysfunction of bladder, unspecified: Secondary | ICD-10-CM | POA: Diagnosis not present

## 2020-05-09 DIAGNOSIS — G8250 Quadriplegia, unspecified: Secondary | ICD-10-CM | POA: Diagnosis not present

## 2020-05-09 DIAGNOSIS — G40909 Epilepsy, unspecified, not intractable, without status epilepticus: Secondary | ICD-10-CM | POA: Diagnosis not present

## 2020-05-09 DIAGNOSIS — K592 Neurogenic bowel, not elsewhere classified: Secondary | ICD-10-CM | POA: Diagnosis not present

## 2020-05-09 DIAGNOSIS — L89224 Pressure ulcer of left hip, stage 4: Secondary | ICD-10-CM | POA: Diagnosis not present

## 2020-05-09 DIAGNOSIS — E440 Moderate protein-calorie malnutrition: Secondary | ICD-10-CM | POA: Diagnosis not present

## 2020-05-11 DIAGNOSIS — E440 Moderate protein-calorie malnutrition: Secondary | ICD-10-CM | POA: Diagnosis not present

## 2020-05-11 DIAGNOSIS — K592 Neurogenic bowel, not elsewhere classified: Secondary | ICD-10-CM | POA: Diagnosis not present

## 2020-05-11 DIAGNOSIS — G8253 Quadriplegia, C5-C7 complete: Secondary | ICD-10-CM | POA: Diagnosis not present

## 2020-05-11 DIAGNOSIS — Z89512 Acquired absence of left leg below knee: Secondary | ICD-10-CM | POA: Diagnosis not present

## 2020-05-11 DIAGNOSIS — N319 Neuromuscular dysfunction of bladder, unspecified: Secondary | ICD-10-CM | POA: Diagnosis not present

## 2020-05-11 DIAGNOSIS — Z89511 Acquired absence of right leg below knee: Secondary | ICD-10-CM | POA: Diagnosis not present

## 2020-05-11 DIAGNOSIS — Z87820 Personal history of traumatic brain injury: Secondary | ICD-10-CM | POA: Diagnosis not present

## 2020-05-11 DIAGNOSIS — G40909 Epilepsy, unspecified, not intractable, without status epilepticus: Secondary | ICD-10-CM | POA: Diagnosis not present

## 2020-05-11 DIAGNOSIS — L89224 Pressure ulcer of left hip, stage 4: Secondary | ICD-10-CM | POA: Diagnosis not present

## 2020-05-13 DIAGNOSIS — G40909 Epilepsy, unspecified, not intractable, without status epilepticus: Secondary | ICD-10-CM | POA: Diagnosis not present

## 2020-05-13 DIAGNOSIS — N319 Neuromuscular dysfunction of bladder, unspecified: Secondary | ICD-10-CM | POA: Diagnosis not present

## 2020-05-13 DIAGNOSIS — K592 Neurogenic bowel, not elsewhere classified: Secondary | ICD-10-CM | POA: Diagnosis not present

## 2020-05-13 DIAGNOSIS — G8253 Quadriplegia, C5-C7 complete: Secondary | ICD-10-CM | POA: Diagnosis not present

## 2020-05-13 DIAGNOSIS — E440 Moderate protein-calorie malnutrition: Secondary | ICD-10-CM | POA: Diagnosis not present

## 2020-05-13 DIAGNOSIS — L89224 Pressure ulcer of left hip, stage 4: Secondary | ICD-10-CM | POA: Diagnosis not present

## 2020-05-16 DIAGNOSIS — G8253 Quadriplegia, C5-C7 complete: Secondary | ICD-10-CM | POA: Diagnosis not present

## 2020-05-16 DIAGNOSIS — G40909 Epilepsy, unspecified, not intractable, without status epilepticus: Secondary | ICD-10-CM | POA: Diagnosis not present

## 2020-05-16 DIAGNOSIS — L89224 Pressure ulcer of left hip, stage 4: Secondary | ICD-10-CM | POA: Diagnosis not present

## 2020-05-16 DIAGNOSIS — N319 Neuromuscular dysfunction of bladder, unspecified: Secondary | ICD-10-CM | POA: Diagnosis not present

## 2020-05-16 DIAGNOSIS — K592 Neurogenic bowel, not elsewhere classified: Secondary | ICD-10-CM | POA: Diagnosis not present

## 2020-05-16 DIAGNOSIS — E440 Moderate protein-calorie malnutrition: Secondary | ICD-10-CM | POA: Diagnosis not present

## 2020-05-18 DIAGNOSIS — G8253 Quadriplegia, C5-C7 complete: Secondary | ICD-10-CM | POA: Diagnosis not present

## 2020-05-18 DIAGNOSIS — G40909 Epilepsy, unspecified, not intractable, without status epilepticus: Secondary | ICD-10-CM | POA: Diagnosis not present

## 2020-05-18 DIAGNOSIS — K592 Neurogenic bowel, not elsewhere classified: Secondary | ICD-10-CM | POA: Diagnosis not present

## 2020-05-18 DIAGNOSIS — E440 Moderate protein-calorie malnutrition: Secondary | ICD-10-CM | POA: Diagnosis not present

## 2020-05-18 DIAGNOSIS — L89224 Pressure ulcer of left hip, stage 4: Secondary | ICD-10-CM | POA: Diagnosis not present

## 2020-05-18 DIAGNOSIS — N319 Neuromuscular dysfunction of bladder, unspecified: Secondary | ICD-10-CM | POA: Diagnosis not present

## 2020-05-20 DIAGNOSIS — L89224 Pressure ulcer of left hip, stage 4: Secondary | ICD-10-CM | POA: Diagnosis not present

## 2020-05-20 DIAGNOSIS — N319 Neuromuscular dysfunction of bladder, unspecified: Secondary | ICD-10-CM | POA: Diagnosis not present

## 2020-05-20 DIAGNOSIS — K592 Neurogenic bowel, not elsewhere classified: Secondary | ICD-10-CM | POA: Diagnosis not present

## 2020-05-20 DIAGNOSIS — G8253 Quadriplegia, C5-C7 complete: Secondary | ICD-10-CM | POA: Diagnosis not present

## 2020-05-20 DIAGNOSIS — E440 Moderate protein-calorie malnutrition: Secondary | ICD-10-CM | POA: Diagnosis not present

## 2020-05-20 DIAGNOSIS — G40909 Epilepsy, unspecified, not intractable, without status epilepticus: Secondary | ICD-10-CM | POA: Diagnosis not present

## 2020-05-23 DIAGNOSIS — G8253 Quadriplegia, C5-C7 complete: Secondary | ICD-10-CM | POA: Diagnosis not present

## 2020-05-23 DIAGNOSIS — G40909 Epilepsy, unspecified, not intractable, without status epilepticus: Secondary | ICD-10-CM | POA: Diagnosis not present

## 2020-05-23 DIAGNOSIS — N319 Neuromuscular dysfunction of bladder, unspecified: Secondary | ICD-10-CM | POA: Diagnosis not present

## 2020-05-23 DIAGNOSIS — K592 Neurogenic bowel, not elsewhere classified: Secondary | ICD-10-CM | POA: Diagnosis not present

## 2020-05-23 DIAGNOSIS — E440 Moderate protein-calorie malnutrition: Secondary | ICD-10-CM | POA: Diagnosis not present

## 2020-05-23 DIAGNOSIS — L89224 Pressure ulcer of left hip, stage 4: Secondary | ICD-10-CM | POA: Diagnosis not present

## 2020-05-25 DIAGNOSIS — N319 Neuromuscular dysfunction of bladder, unspecified: Secondary | ICD-10-CM | POA: Diagnosis not present

## 2020-05-25 DIAGNOSIS — G40909 Epilepsy, unspecified, not intractable, without status epilepticus: Secondary | ICD-10-CM | POA: Diagnosis not present

## 2020-05-25 DIAGNOSIS — L89224 Pressure ulcer of left hip, stage 4: Secondary | ICD-10-CM | POA: Diagnosis not present

## 2020-05-25 DIAGNOSIS — K592 Neurogenic bowel, not elsewhere classified: Secondary | ICD-10-CM | POA: Diagnosis not present

## 2020-05-25 DIAGNOSIS — E440 Moderate protein-calorie malnutrition: Secondary | ICD-10-CM | POA: Diagnosis not present

## 2020-05-25 DIAGNOSIS — G8253 Quadriplegia, C5-C7 complete: Secondary | ICD-10-CM | POA: Diagnosis not present

## 2020-05-27 DIAGNOSIS — G40909 Epilepsy, unspecified, not intractable, without status epilepticus: Secondary | ICD-10-CM | POA: Diagnosis not present

## 2020-05-27 DIAGNOSIS — N319 Neuromuscular dysfunction of bladder, unspecified: Secondary | ICD-10-CM | POA: Diagnosis not present

## 2020-05-27 DIAGNOSIS — K592 Neurogenic bowel, not elsewhere classified: Secondary | ICD-10-CM | POA: Diagnosis not present

## 2020-05-27 DIAGNOSIS — E440 Moderate protein-calorie malnutrition: Secondary | ICD-10-CM | POA: Diagnosis not present

## 2020-05-27 DIAGNOSIS — G8253 Quadriplegia, C5-C7 complete: Secondary | ICD-10-CM | POA: Diagnosis not present

## 2020-05-27 DIAGNOSIS — L89224 Pressure ulcer of left hip, stage 4: Secondary | ICD-10-CM | POA: Diagnosis not present

## 2020-05-30 DIAGNOSIS — G40909 Epilepsy, unspecified, not intractable, without status epilepticus: Secondary | ICD-10-CM | POA: Diagnosis not present

## 2020-05-30 DIAGNOSIS — L89224 Pressure ulcer of left hip, stage 4: Secondary | ICD-10-CM | POA: Diagnosis not present

## 2020-05-30 DIAGNOSIS — G8253 Quadriplegia, C5-C7 complete: Secondary | ICD-10-CM | POA: Diagnosis not present

## 2020-05-30 DIAGNOSIS — K592 Neurogenic bowel, not elsewhere classified: Secondary | ICD-10-CM | POA: Diagnosis not present

## 2020-05-30 DIAGNOSIS — E440 Moderate protein-calorie malnutrition: Secondary | ICD-10-CM | POA: Diagnosis not present

## 2020-05-30 DIAGNOSIS — N319 Neuromuscular dysfunction of bladder, unspecified: Secondary | ICD-10-CM | POA: Diagnosis not present

## 2020-06-01 DIAGNOSIS — G40909 Epilepsy, unspecified, not intractable, without status epilepticus: Secondary | ICD-10-CM | POA: Diagnosis not present

## 2020-06-01 DIAGNOSIS — E440 Moderate protein-calorie malnutrition: Secondary | ICD-10-CM | POA: Diagnosis not present

## 2020-06-01 DIAGNOSIS — N319 Neuromuscular dysfunction of bladder, unspecified: Secondary | ICD-10-CM | POA: Diagnosis not present

## 2020-06-01 DIAGNOSIS — K592 Neurogenic bowel, not elsewhere classified: Secondary | ICD-10-CM | POA: Diagnosis not present

## 2020-06-01 DIAGNOSIS — G8253 Quadriplegia, C5-C7 complete: Secondary | ICD-10-CM | POA: Diagnosis not present

## 2020-06-01 DIAGNOSIS — L89224 Pressure ulcer of left hip, stage 4: Secondary | ICD-10-CM | POA: Diagnosis not present

## 2020-06-03 DIAGNOSIS — E440 Moderate protein-calorie malnutrition: Secondary | ICD-10-CM | POA: Diagnosis not present

## 2020-06-03 DIAGNOSIS — K592 Neurogenic bowel, not elsewhere classified: Secondary | ICD-10-CM | POA: Diagnosis not present

## 2020-06-03 DIAGNOSIS — G40909 Epilepsy, unspecified, not intractable, without status epilepticus: Secondary | ICD-10-CM | POA: Diagnosis not present

## 2020-06-03 DIAGNOSIS — N319 Neuromuscular dysfunction of bladder, unspecified: Secondary | ICD-10-CM | POA: Diagnosis not present

## 2020-06-03 DIAGNOSIS — L89224 Pressure ulcer of left hip, stage 4: Secondary | ICD-10-CM | POA: Diagnosis not present

## 2020-06-03 DIAGNOSIS — G8253 Quadriplegia, C5-C7 complete: Secondary | ICD-10-CM | POA: Diagnosis not present

## 2020-06-06 DIAGNOSIS — G40909 Epilepsy, unspecified, not intractable, without status epilepticus: Secondary | ICD-10-CM | POA: Diagnosis not present

## 2020-06-06 DIAGNOSIS — N319 Neuromuscular dysfunction of bladder, unspecified: Secondary | ICD-10-CM | POA: Diagnosis not present

## 2020-06-06 DIAGNOSIS — K592 Neurogenic bowel, not elsewhere classified: Secondary | ICD-10-CM | POA: Diagnosis not present

## 2020-06-06 DIAGNOSIS — E440 Moderate protein-calorie malnutrition: Secondary | ICD-10-CM | POA: Diagnosis not present

## 2020-06-06 DIAGNOSIS — G8253 Quadriplegia, C5-C7 complete: Secondary | ICD-10-CM | POA: Diagnosis not present

## 2020-06-06 DIAGNOSIS — L89224 Pressure ulcer of left hip, stage 4: Secondary | ICD-10-CM | POA: Diagnosis not present

## 2020-06-08 DIAGNOSIS — G40909 Epilepsy, unspecified, not intractable, without status epilepticus: Secondary | ICD-10-CM | POA: Diagnosis not present

## 2020-06-08 DIAGNOSIS — G8253 Quadriplegia, C5-C7 complete: Secondary | ICD-10-CM | POA: Diagnosis not present

## 2020-06-08 DIAGNOSIS — N319 Neuromuscular dysfunction of bladder, unspecified: Secondary | ICD-10-CM | POA: Diagnosis not present

## 2020-06-08 DIAGNOSIS — K592 Neurogenic bowel, not elsewhere classified: Secondary | ICD-10-CM | POA: Diagnosis not present

## 2020-06-08 DIAGNOSIS — E440 Moderate protein-calorie malnutrition: Secondary | ICD-10-CM | POA: Diagnosis not present

## 2020-06-08 DIAGNOSIS — L89224 Pressure ulcer of left hip, stage 4: Secondary | ICD-10-CM | POA: Diagnosis not present

## 2020-06-10 DIAGNOSIS — Z89511 Acquired absence of right leg below knee: Secondary | ICD-10-CM | POA: Diagnosis not present

## 2020-06-10 DIAGNOSIS — K592 Neurogenic bowel, not elsewhere classified: Secondary | ICD-10-CM | POA: Diagnosis not present

## 2020-06-10 DIAGNOSIS — G40909 Epilepsy, unspecified, not intractable, without status epilepticus: Secondary | ICD-10-CM | POA: Diagnosis not present

## 2020-06-10 DIAGNOSIS — E440 Moderate protein-calorie malnutrition: Secondary | ICD-10-CM | POA: Diagnosis not present

## 2020-06-10 DIAGNOSIS — L89224 Pressure ulcer of left hip, stage 4: Secondary | ICD-10-CM | POA: Diagnosis not present

## 2020-06-10 DIAGNOSIS — Z89512 Acquired absence of left leg below knee: Secondary | ICD-10-CM | POA: Diagnosis not present

## 2020-06-10 DIAGNOSIS — G8253 Quadriplegia, C5-C7 complete: Secondary | ICD-10-CM | POA: Diagnosis not present

## 2020-06-10 DIAGNOSIS — N319 Neuromuscular dysfunction of bladder, unspecified: Secondary | ICD-10-CM | POA: Diagnosis not present

## 2020-06-10 DIAGNOSIS — Z87820 Personal history of traumatic brain injury: Secondary | ICD-10-CM | POA: Diagnosis not present

## 2020-06-13 ENCOUNTER — Other Ambulatory Visit: Payer: Self-pay | Admitting: Neurology

## 2020-06-13 DIAGNOSIS — G40009 Localization-related (focal) (partial) idiopathic epilepsy and epileptic syndromes with seizures of localized onset, not intractable, without status epilepticus: Secondary | ICD-10-CM

## 2020-06-14 DIAGNOSIS — N319 Neuromuscular dysfunction of bladder, unspecified: Secondary | ICD-10-CM | POA: Diagnosis not present

## 2020-06-14 DIAGNOSIS — K592 Neurogenic bowel, not elsewhere classified: Secondary | ICD-10-CM | POA: Diagnosis not present

## 2020-06-14 DIAGNOSIS — L89224 Pressure ulcer of left hip, stage 4: Secondary | ICD-10-CM | POA: Diagnosis not present

## 2020-06-14 DIAGNOSIS — G8253 Quadriplegia, C5-C7 complete: Secondary | ICD-10-CM | POA: Diagnosis not present

## 2020-06-14 DIAGNOSIS — G40909 Epilepsy, unspecified, not intractable, without status epilepticus: Secondary | ICD-10-CM | POA: Diagnosis not present

## 2020-06-14 DIAGNOSIS — E440 Moderate protein-calorie malnutrition: Secondary | ICD-10-CM | POA: Diagnosis not present

## 2020-06-16 ENCOUNTER — Ambulatory Visit (INDEPENDENT_AMBULATORY_CARE_PROVIDER_SITE_OTHER): Payer: Medicare Other | Admitting: Plastic Surgery

## 2020-06-16 ENCOUNTER — Encounter: Payer: Self-pay | Admitting: Plastic Surgery

## 2020-06-16 ENCOUNTER — Other Ambulatory Visit: Payer: Self-pay

## 2020-06-16 VITALS — BP 72/35 | HR 90 | Temp 98.3°F

## 2020-06-16 DIAGNOSIS — L89224 Pressure ulcer of left hip, stage 4: Secondary | ICD-10-CM | POA: Diagnosis not present

## 2020-06-16 DIAGNOSIS — G8253 Quadriplegia, C5-C7 complete: Secondary | ICD-10-CM | POA: Diagnosis not present

## 2020-06-16 DIAGNOSIS — G40909 Epilepsy, unspecified, not intractable, without status epilepticus: Secondary | ICD-10-CM | POA: Diagnosis not present

## 2020-06-16 DIAGNOSIS — E440 Moderate protein-calorie malnutrition: Secondary | ICD-10-CM | POA: Diagnosis not present

## 2020-06-16 DIAGNOSIS — N319 Neuromuscular dysfunction of bladder, unspecified: Secondary | ICD-10-CM | POA: Diagnosis not present

## 2020-06-16 DIAGNOSIS — K592 Neurogenic bowel, not elsewhere classified: Secondary | ICD-10-CM | POA: Diagnosis not present

## 2020-06-16 NOTE — Progress Notes (Signed)
   Referring Provider No referring provider defined for this encounter.   CC:  Chief Complaint  Patient presents with  . Follow-up      [name] is an 49 y.o. male.  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  We have been doing the wound VAC for several months now.  Patient feels like he has not made a lot of progress.  Also feels like the trochanteric area on the right side is getting worse.  He still smoking.  Review of Systems General: Denies fevers or chills  Physical Exam Vitals with BMI 06/16/2020 04/21/2020 03/23/2020  Height - - 5\' 0"   Weight - - 125 lbs  BMI - - 24.41  Systolic 72 128 -  Diastolic 35 76 -  Pulse 90 60 -    General:  No acute distress,  Alert and oriented, Non-Toxic, Normal speech and affect Trochanteric wound on the left side that I previously debrided looks about the same.  There is no necrotic tissue.  The base of the wound is healthy granulation tissue.  His wound VAC that he had on today was not actually turned on. Trochanteric wound on the right side I am seeing for the 1st time and it looks to be at least stage III.  There is some necrotic debris at the base of it.  There is no purulence.  Assessment/Plan Patient presents with bilateral trochanteric pressure wound.  I am uncertain if the wound VAC is staying on all the time but in any event the wound has not made much progress in the past several months.  Patient is frustrated with the lack of progress but he is not a flap candidate at this time as he is still smoking.  I explained if we did the flap under suboptimal circumstances and it died or dehisced then we would lose that option for local coverage of the area.  I am also uncertain if he has the social support required to offload postoperatively.  Furthermore he has a right hip ulcer that would have to be off offloaded as well during his recovery.  Patient has a  friend who had a good experience at [facility] with pressure wound coverage and would like to be seen there.  He previously had an appointment with them but he says he missed his appointment.  I am happy to facilitate a referral and will see how things go after that.  All of his questions were answered.  He did express a desire to stop doing the wound VAC because it is getting in his way with activities and I think that is fine at this point.  We will transition him to wet-to-dry dressings of the left hip wound to be changed once or twice a day.  06/16/2020, 10:58 AM

## 2020-06-18 DIAGNOSIS — G40909 Epilepsy, unspecified, not intractable, without status epilepticus: Secondary | ICD-10-CM | POA: Diagnosis not present

## 2020-06-18 DIAGNOSIS — E440 Moderate protein-calorie malnutrition: Secondary | ICD-10-CM | POA: Diagnosis not present

## 2020-06-18 DIAGNOSIS — L89224 Pressure ulcer of left hip, stage 4: Secondary | ICD-10-CM | POA: Diagnosis not present

## 2020-06-18 DIAGNOSIS — K592 Neurogenic bowel, not elsewhere classified: Secondary | ICD-10-CM | POA: Diagnosis not present

## 2020-06-18 DIAGNOSIS — G8253 Quadriplegia, C5-C7 complete: Secondary | ICD-10-CM | POA: Diagnosis not present

## 2020-06-18 DIAGNOSIS — N319 Neuromuscular dysfunction of bladder, unspecified: Secondary | ICD-10-CM | POA: Diagnosis not present

## 2020-06-20 DIAGNOSIS — G40909 Epilepsy, unspecified, not intractable, without status epilepticus: Secondary | ICD-10-CM | POA: Diagnosis not present

## 2020-06-20 DIAGNOSIS — K592 Neurogenic bowel, not elsewhere classified: Secondary | ICD-10-CM | POA: Diagnosis not present

## 2020-06-20 DIAGNOSIS — L89224 Pressure ulcer of left hip, stage 4: Secondary | ICD-10-CM | POA: Diagnosis not present

## 2020-06-20 DIAGNOSIS — N319 Neuromuscular dysfunction of bladder, unspecified: Secondary | ICD-10-CM | POA: Diagnosis not present

## 2020-06-20 DIAGNOSIS — E440 Moderate protein-calorie malnutrition: Secondary | ICD-10-CM | POA: Diagnosis not present

## 2020-06-20 DIAGNOSIS — G8253 Quadriplegia, C5-C7 complete: Secondary | ICD-10-CM | POA: Diagnosis not present

## 2020-06-22 DIAGNOSIS — G8253 Quadriplegia, C5-C7 complete: Secondary | ICD-10-CM | POA: Diagnosis not present

## 2020-06-22 DIAGNOSIS — G40909 Epilepsy, unspecified, not intractable, without status epilepticus: Secondary | ICD-10-CM | POA: Diagnosis not present

## 2020-06-22 DIAGNOSIS — E440 Moderate protein-calorie malnutrition: Secondary | ICD-10-CM | POA: Diagnosis not present

## 2020-06-22 DIAGNOSIS — K592 Neurogenic bowel, not elsewhere classified: Secondary | ICD-10-CM | POA: Diagnosis not present

## 2020-06-22 DIAGNOSIS — N319 Neuromuscular dysfunction of bladder, unspecified: Secondary | ICD-10-CM | POA: Diagnosis not present

## 2020-06-22 DIAGNOSIS — L89224 Pressure ulcer of left hip, stage 4: Secondary | ICD-10-CM | POA: Diagnosis not present

## 2020-06-24 DIAGNOSIS — G8253 Quadriplegia, C5-C7 complete: Secondary | ICD-10-CM | POA: Diagnosis not present

## 2020-06-24 DIAGNOSIS — K592 Neurogenic bowel, not elsewhere classified: Secondary | ICD-10-CM | POA: Diagnosis not present

## 2020-06-24 DIAGNOSIS — G40909 Epilepsy, unspecified, not intractable, without status epilepticus: Secondary | ICD-10-CM | POA: Diagnosis not present

## 2020-06-24 DIAGNOSIS — E440 Moderate protein-calorie malnutrition: Secondary | ICD-10-CM | POA: Diagnosis not present

## 2020-06-24 DIAGNOSIS — L89224 Pressure ulcer of left hip, stage 4: Secondary | ICD-10-CM | POA: Diagnosis not present

## 2020-06-24 DIAGNOSIS — N319 Neuromuscular dysfunction of bladder, unspecified: Secondary | ICD-10-CM | POA: Diagnosis not present

## 2020-06-27 DIAGNOSIS — K592 Neurogenic bowel, not elsewhere classified: Secondary | ICD-10-CM | POA: Diagnosis not present

## 2020-06-27 DIAGNOSIS — N319 Neuromuscular dysfunction of bladder, unspecified: Secondary | ICD-10-CM | POA: Diagnosis not present

## 2020-06-27 DIAGNOSIS — L89224 Pressure ulcer of left hip, stage 4: Secondary | ICD-10-CM | POA: Diagnosis not present

## 2020-06-27 DIAGNOSIS — E440 Moderate protein-calorie malnutrition: Secondary | ICD-10-CM | POA: Diagnosis not present

## 2020-06-27 DIAGNOSIS — G40909 Epilepsy, unspecified, not intractable, without status epilepticus: Secondary | ICD-10-CM | POA: Diagnosis not present

## 2020-06-27 DIAGNOSIS — G8253 Quadriplegia, C5-C7 complete: Secondary | ICD-10-CM | POA: Diagnosis not present

## 2020-06-29 DIAGNOSIS — G40909 Epilepsy, unspecified, not intractable, without status epilepticus: Secondary | ICD-10-CM | POA: Diagnosis not present

## 2020-06-29 DIAGNOSIS — L89224 Pressure ulcer of left hip, stage 4: Secondary | ICD-10-CM | POA: Diagnosis not present

## 2020-06-29 DIAGNOSIS — G8253 Quadriplegia, C5-C7 complete: Secondary | ICD-10-CM | POA: Diagnosis not present

## 2020-06-29 DIAGNOSIS — N319 Neuromuscular dysfunction of bladder, unspecified: Secondary | ICD-10-CM | POA: Diagnosis not present

## 2020-06-29 DIAGNOSIS — E440 Moderate protein-calorie malnutrition: Secondary | ICD-10-CM | POA: Diagnosis not present

## 2020-06-29 DIAGNOSIS — K592 Neurogenic bowel, not elsewhere classified: Secondary | ICD-10-CM | POA: Diagnosis not present

## 2020-07-01 DIAGNOSIS — E440 Moderate protein-calorie malnutrition: Secondary | ICD-10-CM | POA: Diagnosis not present

## 2020-07-01 DIAGNOSIS — G40909 Epilepsy, unspecified, not intractable, without status epilepticus: Secondary | ICD-10-CM | POA: Diagnosis not present

## 2020-07-01 DIAGNOSIS — N319 Neuromuscular dysfunction of bladder, unspecified: Secondary | ICD-10-CM | POA: Diagnosis not present

## 2020-07-01 DIAGNOSIS — K592 Neurogenic bowel, not elsewhere classified: Secondary | ICD-10-CM | POA: Diagnosis not present

## 2020-07-01 DIAGNOSIS — L89224 Pressure ulcer of left hip, stage 4: Secondary | ICD-10-CM | POA: Diagnosis not present

## 2020-07-01 DIAGNOSIS — G8253 Quadriplegia, C5-C7 complete: Secondary | ICD-10-CM | POA: Diagnosis not present

## 2020-07-04 DIAGNOSIS — G40909 Epilepsy, unspecified, not intractable, without status epilepticus: Secondary | ICD-10-CM | POA: Diagnosis not present

## 2020-07-04 DIAGNOSIS — K592 Neurogenic bowel, not elsewhere classified: Secondary | ICD-10-CM | POA: Diagnosis not present

## 2020-07-04 DIAGNOSIS — N319 Neuromuscular dysfunction of bladder, unspecified: Secondary | ICD-10-CM | POA: Diagnosis not present

## 2020-07-04 DIAGNOSIS — E440 Moderate protein-calorie malnutrition: Secondary | ICD-10-CM | POA: Diagnosis not present

## 2020-07-04 DIAGNOSIS — G8253 Quadriplegia, C5-C7 complete: Secondary | ICD-10-CM | POA: Diagnosis not present

## 2020-07-04 DIAGNOSIS — L89224 Pressure ulcer of left hip, stage 4: Secondary | ICD-10-CM | POA: Diagnosis not present

## 2020-07-06 DIAGNOSIS — G40909 Epilepsy, unspecified, not intractable, without status epilepticus: Secondary | ICD-10-CM | POA: Diagnosis not present

## 2020-07-06 DIAGNOSIS — G8253 Quadriplegia, C5-C7 complete: Secondary | ICD-10-CM | POA: Diagnosis not present

## 2020-07-06 DIAGNOSIS — K592 Neurogenic bowel, not elsewhere classified: Secondary | ICD-10-CM | POA: Diagnosis not present

## 2020-07-06 DIAGNOSIS — N319 Neuromuscular dysfunction of bladder, unspecified: Secondary | ICD-10-CM | POA: Diagnosis not present

## 2020-07-06 DIAGNOSIS — L89224 Pressure ulcer of left hip, stage 4: Secondary | ICD-10-CM | POA: Diagnosis not present

## 2020-07-06 DIAGNOSIS — E440 Moderate protein-calorie malnutrition: Secondary | ICD-10-CM | POA: Diagnosis not present

## 2020-07-08 DIAGNOSIS — G8253 Quadriplegia, C5-C7 complete: Secondary | ICD-10-CM | POA: Diagnosis not present

## 2020-07-08 DIAGNOSIS — N319 Neuromuscular dysfunction of bladder, unspecified: Secondary | ICD-10-CM | POA: Diagnosis not present

## 2020-07-08 DIAGNOSIS — K592 Neurogenic bowel, not elsewhere classified: Secondary | ICD-10-CM | POA: Diagnosis not present

## 2020-07-08 DIAGNOSIS — L89224 Pressure ulcer of left hip, stage 4: Secondary | ICD-10-CM | POA: Diagnosis not present

## 2020-07-08 DIAGNOSIS — G40909 Epilepsy, unspecified, not intractable, without status epilepticus: Secondary | ICD-10-CM | POA: Diagnosis not present

## 2020-07-08 DIAGNOSIS — E440 Moderate protein-calorie malnutrition: Secondary | ICD-10-CM | POA: Diagnosis not present

## 2020-07-10 DIAGNOSIS — Z87820 Personal history of traumatic brain injury: Secondary | ICD-10-CM | POA: Diagnosis not present

## 2020-07-10 DIAGNOSIS — G40909 Epilepsy, unspecified, not intractable, without status epilepticus: Secondary | ICD-10-CM | POA: Diagnosis not present

## 2020-07-10 DIAGNOSIS — N319 Neuromuscular dysfunction of bladder, unspecified: Secondary | ICD-10-CM | POA: Diagnosis not present

## 2020-07-10 DIAGNOSIS — G8253 Quadriplegia, C5-C7 complete: Secondary | ICD-10-CM | POA: Diagnosis not present

## 2020-07-10 DIAGNOSIS — E440 Moderate protein-calorie malnutrition: Secondary | ICD-10-CM | POA: Diagnosis not present

## 2020-07-10 DIAGNOSIS — K592 Neurogenic bowel, not elsewhere classified: Secondary | ICD-10-CM | POA: Diagnosis not present

## 2020-07-10 DIAGNOSIS — L89224 Pressure ulcer of left hip, stage 4: Secondary | ICD-10-CM | POA: Diagnosis not present

## 2020-07-10 DIAGNOSIS — Z89511 Acquired absence of right leg below knee: Secondary | ICD-10-CM | POA: Diagnosis not present

## 2020-07-10 DIAGNOSIS — Z89512 Acquired absence of left leg below knee: Secondary | ICD-10-CM | POA: Diagnosis not present

## 2020-07-11 DIAGNOSIS — K592 Neurogenic bowel, not elsewhere classified: Secondary | ICD-10-CM | POA: Diagnosis not present

## 2020-07-11 DIAGNOSIS — E440 Moderate protein-calorie malnutrition: Secondary | ICD-10-CM | POA: Diagnosis not present

## 2020-07-11 DIAGNOSIS — N319 Neuromuscular dysfunction of bladder, unspecified: Secondary | ICD-10-CM | POA: Diagnosis not present

## 2020-07-11 DIAGNOSIS — L89224 Pressure ulcer of left hip, stage 4: Secondary | ICD-10-CM | POA: Diagnosis not present

## 2020-07-11 DIAGNOSIS — G8253 Quadriplegia, C5-C7 complete: Secondary | ICD-10-CM | POA: Diagnosis not present

## 2020-07-11 DIAGNOSIS — G40909 Epilepsy, unspecified, not intractable, without status epilepticus: Secondary | ICD-10-CM | POA: Diagnosis not present

## 2020-07-13 DIAGNOSIS — L89224 Pressure ulcer of left hip, stage 4: Secondary | ICD-10-CM | POA: Diagnosis not present

## 2020-07-13 DIAGNOSIS — G8253 Quadriplegia, C5-C7 complete: Secondary | ICD-10-CM | POA: Diagnosis not present

## 2020-07-13 DIAGNOSIS — G40909 Epilepsy, unspecified, not intractable, without status epilepticus: Secondary | ICD-10-CM | POA: Diagnosis not present

## 2020-07-13 DIAGNOSIS — N319 Neuromuscular dysfunction of bladder, unspecified: Secondary | ICD-10-CM | POA: Diagnosis not present

## 2020-07-13 DIAGNOSIS — K592 Neurogenic bowel, not elsewhere classified: Secondary | ICD-10-CM | POA: Diagnosis not present

## 2020-07-13 DIAGNOSIS — E440 Moderate protein-calorie malnutrition: Secondary | ICD-10-CM | POA: Diagnosis not present

## 2020-07-15 DIAGNOSIS — G40909 Epilepsy, unspecified, not intractable, without status epilepticus: Secondary | ICD-10-CM | POA: Diagnosis not present

## 2020-07-15 DIAGNOSIS — N319 Neuromuscular dysfunction of bladder, unspecified: Secondary | ICD-10-CM | POA: Diagnosis not present

## 2020-07-15 DIAGNOSIS — E440 Moderate protein-calorie malnutrition: Secondary | ICD-10-CM | POA: Diagnosis not present

## 2020-07-15 DIAGNOSIS — G8253 Quadriplegia, C5-C7 complete: Secondary | ICD-10-CM | POA: Diagnosis not present

## 2020-07-15 DIAGNOSIS — K592 Neurogenic bowel, not elsewhere classified: Secondary | ICD-10-CM | POA: Diagnosis not present

## 2020-07-15 DIAGNOSIS — L89224 Pressure ulcer of left hip, stage 4: Secondary | ICD-10-CM | POA: Diagnosis not present

## 2020-07-18 DIAGNOSIS — G40909 Epilepsy, unspecified, not intractable, without status epilepticus: Secondary | ICD-10-CM | POA: Diagnosis not present

## 2020-07-18 DIAGNOSIS — E440 Moderate protein-calorie malnutrition: Secondary | ICD-10-CM | POA: Diagnosis not present

## 2020-07-18 DIAGNOSIS — L89224 Pressure ulcer of left hip, stage 4: Secondary | ICD-10-CM | POA: Diagnosis not present

## 2020-07-18 DIAGNOSIS — N319 Neuromuscular dysfunction of bladder, unspecified: Secondary | ICD-10-CM | POA: Diagnosis not present

## 2020-07-18 DIAGNOSIS — G8253 Quadriplegia, C5-C7 complete: Secondary | ICD-10-CM | POA: Diagnosis not present

## 2020-07-18 DIAGNOSIS — K592 Neurogenic bowel, not elsewhere classified: Secondary | ICD-10-CM | POA: Diagnosis not present

## 2020-07-20 DIAGNOSIS — L89224 Pressure ulcer of left hip, stage 4: Secondary | ICD-10-CM | POA: Diagnosis not present

## 2020-07-20 DIAGNOSIS — N319 Neuromuscular dysfunction of bladder, unspecified: Secondary | ICD-10-CM | POA: Diagnosis not present

## 2020-07-20 DIAGNOSIS — E440 Moderate protein-calorie malnutrition: Secondary | ICD-10-CM | POA: Diagnosis not present

## 2020-07-20 DIAGNOSIS — G40909 Epilepsy, unspecified, not intractable, without status epilepticus: Secondary | ICD-10-CM | POA: Diagnosis not present

## 2020-07-20 DIAGNOSIS — K592 Neurogenic bowel, not elsewhere classified: Secondary | ICD-10-CM | POA: Diagnosis not present

## 2020-07-20 DIAGNOSIS — G8253 Quadriplegia, C5-C7 complete: Secondary | ICD-10-CM | POA: Diagnosis not present

## 2020-07-22 DIAGNOSIS — G8253 Quadriplegia, C5-C7 complete: Secondary | ICD-10-CM | POA: Diagnosis not present

## 2020-07-22 DIAGNOSIS — N319 Neuromuscular dysfunction of bladder, unspecified: Secondary | ICD-10-CM | POA: Diagnosis not present

## 2020-07-22 DIAGNOSIS — L89224 Pressure ulcer of left hip, stage 4: Secondary | ICD-10-CM | POA: Diagnosis not present

## 2020-07-22 DIAGNOSIS — G40909 Epilepsy, unspecified, not intractable, without status epilepticus: Secondary | ICD-10-CM | POA: Diagnosis not present

## 2020-07-22 DIAGNOSIS — E440 Moderate protein-calorie malnutrition: Secondary | ICD-10-CM | POA: Diagnosis not present

## 2020-07-22 DIAGNOSIS — K592 Neurogenic bowel, not elsewhere classified: Secondary | ICD-10-CM | POA: Diagnosis not present

## 2020-07-25 ENCOUNTER — Telehealth: Payer: Self-pay

## 2020-07-25 DIAGNOSIS — K592 Neurogenic bowel, not elsewhere classified: Secondary | ICD-10-CM | POA: Diagnosis not present

## 2020-07-25 DIAGNOSIS — G40909 Epilepsy, unspecified, not intractable, without status epilepticus: Secondary | ICD-10-CM | POA: Diagnosis not present

## 2020-07-25 DIAGNOSIS — E440 Moderate protein-calorie malnutrition: Secondary | ICD-10-CM | POA: Diagnosis not present

## 2020-07-25 DIAGNOSIS — N319 Neuromuscular dysfunction of bladder, unspecified: Secondary | ICD-10-CM | POA: Diagnosis not present

## 2020-07-25 DIAGNOSIS — L89224 Pressure ulcer of left hip, stage 4: Secondary | ICD-10-CM | POA: Diagnosis not present

## 2020-07-25 DIAGNOSIS — G8253 Quadriplegia, C5-C7 complete: Secondary | ICD-10-CM | POA: Diagnosis not present

## 2020-07-25 NOTE — Telephone Encounter (Signed)
I called and sw HHN to advise that I spoke with the pt and offered an appt here in the office to be seen and he declined. He states that [facility] and that he has an appt on Monday and that he did not need to bee seen. He voiced understanding that if anything changed we are happy to see him at  any time. I advised that HHN that I would just document this in his chart and if she needed anything to please call the office as well.

## 2020-07-25 NOTE — Telephone Encounter (Signed)
[facility]cia [facility]  called in to notify dr duda that as she been seeing patient and treating him for wound since June , patient advised her today he has a wound on right hip and would not let her see new wound

## 2020-07-25 NOTE — Telephone Encounter (Signed)
I called and sw the pt and he advised that [facility] on his right hip and that he explained this to the [facility] and that if he needs anything he will call us to make an appt.

## 2020-07-25 NOTE — Telephone Encounter (Signed)
[phone] is the call back number for kecia per last note

## 2020-07-27 DIAGNOSIS — L89224 Pressure ulcer of left hip, stage 4: Secondary | ICD-10-CM | POA: Diagnosis not present

## 2020-07-27 DIAGNOSIS — N319 Neuromuscular dysfunction of bladder, unspecified: Secondary | ICD-10-CM | POA: Diagnosis not present

## 2020-07-27 DIAGNOSIS — G8253 Quadriplegia, C5-C7 complete: Secondary | ICD-10-CM | POA: Diagnosis not present

## 2020-07-27 DIAGNOSIS — K592 Neurogenic bowel, not elsewhere classified: Secondary | ICD-10-CM | POA: Diagnosis not present

## 2020-07-27 DIAGNOSIS — G40909 Epilepsy, unspecified, not intractable, without status epilepticus: Secondary | ICD-10-CM | POA: Diagnosis not present

## 2020-07-27 DIAGNOSIS — E440 Moderate protein-calorie malnutrition: Secondary | ICD-10-CM | POA: Diagnosis not present

## 2020-07-29 ENCOUNTER — Encounter: Payer: Medicare Other | Admitting: Physical Medicine & Rehabilitation

## 2020-07-29 ENCOUNTER — Other Ambulatory Visit: Payer: Self-pay

## 2020-07-29 ENCOUNTER — Encounter: Payer: Self-pay | Admitting: Physical Medicine and Rehabilitation

## 2020-07-29 ENCOUNTER — Encounter: Payer: Medicare Other | Attending: Physical Medicine & Rehabilitation | Admitting: Physical Medicine and Rehabilitation

## 2020-07-29 DIAGNOSIS — L89224 Pressure ulcer of left hip, stage 4: Secondary | ICD-10-CM | POA: Diagnosis not present

## 2020-07-29 DIAGNOSIS — E440 Moderate protein-calorie malnutrition: Secondary | ICD-10-CM | POA: Diagnosis not present

## 2020-07-29 DIAGNOSIS — K592 Neurogenic bowel, not elsewhere classified: Secondary | ICD-10-CM | POA: Diagnosis not present

## 2020-07-29 DIAGNOSIS — N319 Neuromuscular dysfunction of bladder, unspecified: Secondary | ICD-10-CM | POA: Diagnosis not present

## 2020-07-29 DIAGNOSIS — G8253 Quadriplegia, C5-C7 complete: Secondary | ICD-10-CM | POA: Diagnosis not present

## 2020-07-29 DIAGNOSIS — G40909 Epilepsy, unspecified, not intractable, without status epilepticus: Secondary | ICD-10-CM | POA: Diagnosis not present
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
## 2020-08-01 ENCOUNTER — Telehealth: Payer: Self-pay | Admitting: Orthopedic Surgery

## 2020-08-01 DIAGNOSIS — E440 Moderate protein-calorie malnutrition: Secondary | ICD-10-CM | POA: Diagnosis not present

## 2020-08-01 DIAGNOSIS — L89226 Pressure-induced deep tissue damage of left hip: Secondary | ICD-10-CM | POA: Diagnosis not present

## 2020-08-01 DIAGNOSIS — G40909 Epilepsy, unspecified, not intractable, without status epilepticus: Secondary | ICD-10-CM | POA: Diagnosis not present

## 2020-08-01 DIAGNOSIS — L89224 Pressure ulcer of left hip, stage 4: Secondary | ICD-10-CM | POA: Diagnosis not present

## 2020-08-01 DIAGNOSIS — K592 Neurogenic bowel, not elsewhere classified: Secondary | ICD-10-CM | POA: Diagnosis not present

## 2020-08-01 DIAGNOSIS — N319 Neuromuscular dysfunction of bladder, unspecified: Secondary | ICD-10-CM | POA: Diagnosis not present

## 2020-08-01 DIAGNOSIS — G8253 Quadriplegia, C5-C7 complete: Secondary | ICD-10-CM | POA: Diagnosis not present

## 2020-08-01 NOTE — Telephone Encounter (Signed)
I called and spoke with [name]. Per chart, patient is being treated by [facility] and she will have to get orders from them. Currently sees Dr. Ronda Fairly there.  [name] states that they are unable to get the catheter that the patient likes so he wants to discontinue their services. She will call [facility] for orders.

## 2020-08-01 NOTE — Telephone Encounter (Signed)
Demetria with [facility] called stating the pt wants to be discharged but then there will be no one to take care of his wound, so [facility] would like a CB with any more orders for the pt wound so I guess they cann start to discharge him  [facility] [phone]

## 2020-08-03 DIAGNOSIS — G40909 Epilepsy, unspecified, not intractable, without status epilepticus: Secondary | ICD-10-CM | POA: Diagnosis not present

## 2020-08-03 DIAGNOSIS — N319 Neuromuscular dysfunction of bladder, unspecified: Secondary | ICD-10-CM | POA: Diagnosis not present

## 2020-08-03 DIAGNOSIS — G8253 Quadriplegia, C5-C7 complete: Secondary | ICD-10-CM | POA: Diagnosis not present

## 2020-08-03 DIAGNOSIS — L89224 Pressure ulcer of left hip, stage 4: Secondary | ICD-10-CM | POA: Diagnosis not present

## 2020-08-03 DIAGNOSIS — K592 Neurogenic bowel, not elsewhere classified: Secondary | ICD-10-CM | POA: Diagnosis not present

## 2020-08-03 DIAGNOSIS — E440 Moderate protein-calorie malnutrition: Secondary | ICD-10-CM | POA: Diagnosis not present

## 2020-08-05 DIAGNOSIS — E440 Moderate protein-calorie malnutrition: Secondary | ICD-10-CM | POA: Diagnosis not present

## 2020-08-05 DIAGNOSIS — G40909 Epilepsy, unspecified, not intractable, without status epilepticus: Secondary | ICD-10-CM | POA: Diagnosis not present

## 2020-08-05 DIAGNOSIS — N319 Neuromuscular dysfunction of bladder, unspecified: Secondary | ICD-10-CM | POA: Diagnosis not present

## 2020-08-05 DIAGNOSIS — L89224 Pressure ulcer of left hip, stage 4: Secondary | ICD-10-CM | POA: Diagnosis not present

## 2020-08-05 DIAGNOSIS — K592 Neurogenic bowel, not elsewhere classified: Secondary | ICD-10-CM | POA: Diagnosis not present

## 2020-08-05 DIAGNOSIS — G8253 Quadriplegia, C5-C7 complete: Secondary | ICD-10-CM | POA: Diagnosis not present

## 2020-08-06 ENCOUNTER — Emergency Department (HOSPITAL_COMMUNITY)
Admission: EM | Admit: 2020-08-06 | Discharge: 2020-08-06 | Discharge status: Absent without leave | Payer: Medicare Other

## 2020-08-06 NOTE — ED Notes (Signed)
Pt was at registration desk checking in to be seen. Registration lady informed the pt the triage process and how long the wait will be. He stated he no longer wanted to be seen and left. RN notified.

## 2020-08-07 ENCOUNTER — Other Ambulatory Visit: Payer: Self-pay

## 2020-08-07 ENCOUNTER — Encounter (HOSPITAL_BASED_OUTPATIENT_CLINIC_OR_DEPARTMENT_OTHER): Payer: Self-pay | Admitting: Emergency Medicine

## 2020-08-07 ENCOUNTER — Emergency Department (HOSPITAL_BASED_OUTPATIENT_CLINIC_OR_DEPARTMENT_OTHER)
Admission: EM | Admit: 2020-08-07 | Discharge: 2020-08-07 | Disposition: A | Discharge status: Home / Self Care | Payer: Medicare Other | Attending: Emergency Medicine | Admitting: Emergency Medicine

## 2020-08-07 ENCOUNTER — Emergency Department (HOSPITAL_BASED_OUTPATIENT_CLINIC_OR_DEPARTMENT_OTHER): Payer: Medicare Other

## 2020-08-07 DIAGNOSIS — F17210 Nicotine dependence, cigarettes, uncomplicated: Secondary | ICD-10-CM | POA: Diagnosis not present

## 2020-08-07 DIAGNOSIS — L03116 Cellulitis of left lower limb: Secondary | ICD-10-CM

## 2020-08-07 DIAGNOSIS — L97829 Non-pressure chronic ulcer of other part of left lower leg with unspecified severity: Secondary | ICD-10-CM | POA: Insufficient documentation

## 2020-08-07 DIAGNOSIS — Z89512 Acquired absence of left leg below knee: Secondary | ICD-10-CM | POA: Diagnosis not present

## 2020-08-07 LAB — CBC WITH DIFFERENTIAL/PLATELET
Abs Immature Granulocytes: 0.04 10*3/uL (ref 0.00–0.07)
Basophils Absolute: 0 10*3/uL (ref 0.0–0.1)
Basophils Relative: 0 %
Eosinophils Absolute: 0.1 10*3/uL (ref 0.0–0.5)
Eosinophils Relative: 1 %
HCT: 37.1 % — ABNORMAL LOW (ref 39.0–52.0)
Hemoglobin: 11.4 g/dL — ABNORMAL LOW (ref 13.0–17.0)
Immature Granulocytes: 1 %
Lymphocytes Relative: 26 %
Lymphs Abs: 1.9 10*3/uL (ref 0.7–4.0)
MCH: 27.5 pg (ref 26.0–34.0)
MCHC: 30.7 g/dL (ref 30.0–36.0)
MCV: 89.4 fL (ref 80.0–100.0)
Monocytes Absolute: 0.9 10*3/uL (ref 0.1–1.0)
Monocytes Relative: 12 %
Neutro Abs: 4.3 10*3/uL (ref 1.7–7.7)
Neutrophils Relative %: 60 %
Platelets: 329 10*3/uL (ref 150–400)
RBC: 4.15 MIL/uL — ABNORMAL LOW (ref 4.22–5.81)
RDW: 15.2 % (ref 11.5–15.5)
WBC: 7.2 10*3/uL (ref 4.0–10.5)
nRBC: 0 % (ref 0.0–0.2)

## 2020-08-07 LAB — COMPREHENSIVE METABOLIC PANEL
ALT: 7 U/L (ref 0–44)
AST: 19 U/L (ref 15–41)
Albumin: 2.1 g/dL — ABNORMAL LOW (ref 3.5–5.0)
Alkaline Phosphatase: 61 U/L (ref 38–126)
Anion gap: 9 (ref 5–15)
BUN: 22 mg/dL — ABNORMAL HIGH (ref 6–20)
CO2: 25 mmol/L (ref 22–32)
Calcium: 8.6 mg/dL — ABNORMAL LOW (ref 8.9–10.3)
Chloride: 101 mmol/L (ref 98–111)
Creatinine, Ser: 0.89 mg/dL (ref 0.61–1.24)
GFR, Estimated: >60 mL/min (ref >60)
Glucose, Bld: 76 mg/dL (ref 70–99)
Potassium: 5.2 mmol/L — ABNORMAL HIGH (ref 3.5–5.1)
Sodium: 135 mmol/L (ref 135–145)
Total Bilirubin: 0.3 mg/dL (ref 0.3–1.2)
Total Protein: 8.9 g/dL — ABNORMAL HIGH (ref 6.5–8.1)

## 2020-08-07 LAB — CBG MONITORING, ED: Glucose-Capillary: 61 mg/dL — ABNORMAL LOW (ref 70–99)

## 2020-08-07 LAB — LACTIC ACID, PLASMA: Lactic Acid, Venous: 1.3 mmol/L (ref 0.5–1.9)

## 2020-08-07 MED ORDER — DOXYCYCLINE HYCLATE 100 MG PO CAPS: 100.0000 mg | ORAL_CAPSULE | Freq: Two times a day (BID) | ORAL | 0 refills | Status: DC

## 2020-08-07 NOTE — Discharge Instructions (Signed)
Your work-up today did not show evidence of systemic infection however clinically were concerned about an infection at the your left stump wound.  I spoke with orthopedics who looked at the images and felt that they want to see you in clinic tomorrow rather than have [place] explore it here.  X-ray did not show bony infection however I am concerned that the infection is near the bone.  Please follow-up with Ortho care tomorrow.  Please take the antibiotics.  Please rest and stay hydrated.  If any symptoms change or worsen, please return to the nearest emergency department.

## 2020-08-07 NOTE — ED Triage Notes (Addendum)
Pt c/o unable to sleep last night. Pt reports that he had an amputation, 6 months ago, to left knee and noticed the wound opening x 2 1/2 weeks. Pt denies pain to the area.

## 2020-08-07 NOTE — ED Notes (Signed)
Peanut butter crackers and milk provided to pt after clearing with EDP
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
## 2020-08-07 NOTE — ED Notes (Signed)
Pt in Xray for lab collection.

## 2020-08-08 DIAGNOSIS — G40909 Epilepsy, unspecified, not intractable, without status epilepticus: Secondary | ICD-10-CM | POA: Diagnosis not present

## 2020-08-08 DIAGNOSIS — E440 Moderate protein-calorie malnutrition: Secondary | ICD-10-CM | POA: Diagnosis not present

## 2020-08-08 DIAGNOSIS — L89224 Pressure ulcer of left hip, stage 4: Secondary | ICD-10-CM | POA: Diagnosis not present

## 2020-08-08 DIAGNOSIS — K592 Neurogenic bowel, not elsewhere classified: Secondary | ICD-10-CM | POA: Diagnosis not present

## 2020-08-08 DIAGNOSIS — G8253 Quadriplegia, C5-C7 complete: Secondary | ICD-10-CM | POA: Diagnosis not present

## 2020-08-08 DIAGNOSIS — N319 Neuromuscular dysfunction of bladder, unspecified: Secondary | ICD-10-CM | POA: Diagnosis not present

## 2020-08-09 DIAGNOSIS — L89224 Pressure ulcer of left hip, stage 4: Secondary | ICD-10-CM | POA: Diagnosis not present

## 2020-08-11 ENCOUNTER — Ambulatory Visit (INDEPENDENT_AMBULATORY_CARE_PROVIDER_SITE_OTHER): Payer: Medicare Other | Admitting: Orthopedic Surgery

## 2020-08-11 ENCOUNTER — Encounter: Payer: Self-pay | Admitting: Physician Assistant

## 2020-08-11 VITALS — Ht 60.0 in | Wt 125.0 lb

## 2020-08-11 DIAGNOSIS — T8781 Dehiscence of amputation stump: Secondary | ICD-10-CM | POA: Diagnosis not present

## 2020-08-11 DIAGNOSIS — Z89612 Acquired absence of left leg above knee: Secondary | ICD-10-CM | POA: Diagnosis not present

## 2020-08-11 MED ORDER — DOXYCYCLINE HYCLATE 100 MG PO CAPS: 100.0000 mg | ORAL_CAPSULE | Freq: Two times a day (BID) | ORAL | 0 refills | Status: DC
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
## 2020-08-25 ENCOUNTER — Ambulatory Visit (INDEPENDENT_AMBULATORY_CARE_PROVIDER_SITE_OTHER): Payer: Medicare Other | Admitting: Physician Assistant

## 2020-08-25 ENCOUNTER — Encounter: Payer: Self-pay | Admitting: Orthopedic Surgery

## 2020-08-25 DIAGNOSIS — Z89612 Acquired absence of left leg above knee: Secondary | ICD-10-CM
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
## 2020-08-29 ENCOUNTER — Telehealth: Payer: Self-pay

## 2020-08-29 NOTE — Telephone Encounter (Signed)
Matthias Hughs, HHN with [facility] would like a call back concerning patient.  Cb# [phone].  Please advise.  Thank you.

## 2020-08-29 NOTE — Telephone Encounter (Signed)
I called and sw HHn and she advised that the pt has a new area on his left elbow tht he will not allow them to eval or treat. Pt states that he is taking care of it himself and just needed to make [place] aware. To call with any questions also advised that he had an order for a wound vac pick up because there was not a supply order. I advised that we are not treating his legs with a vac and she advised that someone faxed in an order it did not come from our office. She will investigate further and call if there is anything that we can do to help.

## 2020-08-31 ENCOUNTER — Encounter
Payer: Medicare Other | Attending: Physical Medicine and Rehabilitation | Admitting: Physical Medicine and Rehabilitation

## 2020-08-31 ENCOUNTER — Telehealth: Payer: Self-pay

## 2020-08-31 ENCOUNTER — Encounter: Payer: Self-pay | Admitting: Physical Medicine and Rehabilitation

## 2020-08-31 ENCOUNTER — Other Ambulatory Visit: Payer: Self-pay

## 2020-08-31 ENCOUNTER — Telehealth: Payer: Self-pay | Admitting: Plastic Surgery

## 2020-08-31 VITALS — BP 104/62 | HR 98

## 2020-08-31 DIAGNOSIS — Z89511 Acquired absence of right leg below knee: Secondary | ICD-10-CM | POA: Diagnosis not present

## 2020-08-31 DIAGNOSIS — G8250 Quadriplegia, unspecified: Secondary | ICD-10-CM | POA: Diagnosis not present

## 2020-08-31 DIAGNOSIS — Z89612 Acquired absence of left leg above knee: Secondary | ICD-10-CM | POA: Insufficient documentation

## 2020-08-31 DIAGNOSIS — L089 Local infection of the skin and subcutaneous tissue, unspecified: Secondary | ICD-10-CM | POA: Diagnosis not present

## 2020-08-31 DIAGNOSIS — L89223 Pressure ulcer of left hip, stage 3: Secondary | ICD-10-CM | POA: Diagnosis not present

## 2020-08-31 DIAGNOSIS — T148XXA Other injury of unspecified body region, initial encounter: Secondary | ICD-10-CM | POA: Insufficient documentation

## 2020-08-31 NOTE — Telephone Encounter (Signed)
[name] with [facility] would like a call back.  Stated that she spoke with you about [employer] concerning wound vac supplies.  Cb# [phone].  Please advise.  Thank you.

## 2020-08-31 NOTE — Patient Instructions (Signed)
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 pressure ulcer and wound VAC on it- closed to little circle- here for evaluation.    1. Can't do baclofen due to seizures -never do baclofen for spasticity- can reduce seizure threshold   2. We discussed Dantrolene- for spasticity- so R leg will bend. So hopefully wont have to wear strap- think about it!   3. To talk to wound care doctor-  Suggest if needs to go higher on L AKA- above the knee amputation- no more than 3-4 inches, so doesn't put more pressure on ischium on butt! If doesn't have leg to put pressure on, t puts more pressure on ischium, esp on Left.  - has technology to do prosthesis for L leg  4.   Will have to check liver function 2x/year (normally 49 yr old should have that checked 1x/year already).   5. F/U  3-4 months- double visit for SCI

## 2020-08-31 NOTE — Telephone Encounter (Signed)
Matthias Hughs, Nurse with [facility] home health, called to advise she was trying to order the patient more wound care supplies, but someone told [employer] wound vac is no longer needed. She stated patient does still need the wound vac and more supplies cannot be ordered if the wound vac is cancelled. I reviewed the notes and saw that patient desired to stop using the wound vac. Matthias Hughs is stating he still needs it for time being. He only has enough for 2 days. Please call her back if there are any questions #[phone]. Or just update the wound vac order with [employer].
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
## 2020-08-31 NOTE — Telephone Encounter (Signed)
Returned [employer] call.  LMVM that patient is now under the care [facility] and they are planning to send him to [facility] or [facility] for a 2nd opinion

## 2020-09-02 ENCOUNTER — Ambulatory Visit: Payer: PRIVATE HEALTH INSURANCE | Admitting: Neurology

## 2020-09-02 DIAGNOSIS — L89226 Pressure-induced deep tissue damage of left hip: Secondary | ICD-10-CM | POA: Diagnosis not present

## 2020-09-02 DIAGNOSIS — L97122 Non-pressure chronic ulcer of left thigh with fat layer exposed: Secondary | ICD-10-CM | POA: Diagnosis not present

## 2020-09-02 DIAGNOSIS — Z993 Dependence on wheelchair: Secondary | ICD-10-CM | POA: Diagnosis not present

## 2020-09-02 DIAGNOSIS — T8789 Other complications of amputation stump: Secondary | ICD-10-CM | POA: Diagnosis not present

## 2020-09-02 DIAGNOSIS — F17210 Nicotine dependence, cigarettes, uncomplicated: Secondary | ICD-10-CM | POA: Diagnosis not present

## 2020-09-02 DIAGNOSIS — L89224 Pressure ulcer of left hip, stage 4: Secondary | ICD-10-CM | POA: Diagnosis not present

## 2020-09-04 ENCOUNTER — Other Ambulatory Visit: Payer: Self-pay | Admitting: Physician Assistant

## 2020-09-05 ENCOUNTER — Telehealth: Payer: Self-pay

## 2020-09-05 NOTE — Telephone Encounter (Addendum)
Returned [name]. Advised her that patient has not been seen in our office since 06/16/20 with no follow up appointments in the future. Indicated per notes in  EMR, he has been seen at [name] on 08/01/2020. However, they could not make dressing recommendations as patient refused to let them take off wound vac to look at wound. He does not wish to be manage by Dr. Arita Miss and would like to continue [name] with [name]. He was recently seen again on 09/02/2020 at [name] for a follow-up. During visit, he did not want them to evaluate the pressure ulceration of left hip. He wanted a second opinion for management of left AKA ulceration. He had been offered a revision of AKA in the past, but declined.

## 2020-09-05 NOTE — Telephone Encounter (Signed)
I called and sw HHN to advise that Dr. Merry Proud with plastic surgery is managing the pt's hip wound and we are managing the amputations. The pt has declined revision of the left. Gave contact information for Dr. Thomos Lemons office and to call with any questions.

## 2020-09-05 NOTE — Telephone Encounter (Signed)
Davene called from [facility] to let us know that there has been a change with the patient's wound.  Now the patient appears to have undermining and tunneling to the hip wound.  Davene would like to see if Dr. Arita Miss is ok with signing the wound vac orders for Home Health to continue three times a week (Monday, Wednesday, Friday).  Since there has been a change to the wound, she wants to see if we feel that the patient needs to come back in to be seen by Dr. Arita Miss.  Please call.

## 2020-09-05 NOTE — Telephone Encounter (Signed)
Davene from [employer] called she stated she was checking the patients wound over the weekend and she noticed the patient has a wound vac on his hip Davene stated the wound looked "odd" and there was tunneling in 4 areas and undermining around the wound. Call [phone]

## 2020-09-05 NOTE — Telephone Encounter (Signed)
See other note

## 2020-09-07 ENCOUNTER — Telehealth: Payer: Self-pay | Admitting: Plastic Surgery

## 2020-09-07 NOTE — Telephone Encounter (Signed)
Returned [employer]. Advised patient has not seen Dr Arita Miss sine 06/16/20 with no future follow up appointments. He has been going to the Wound Care at [facility]. He indicated on his visit 08/01/2020 that he did not wish to return, or be treated by Dr. Arita Miss. Keane Scrape to call the Wound Care Center at [facility]

## 2020-09-07 NOTE — Telephone Encounter (Signed)
Please call Lawson Fiscal, Nurse with [facility] health, with updated verbal order for patient. [phone]

## 2020-09-08 ENCOUNTER — Telehealth: Payer: Self-pay

## 2020-09-08 ENCOUNTER — Telehealth: Payer: Self-pay | Admitting: Plastic Surgery

## 2020-09-08 DIAGNOSIS — L89224 Pressure ulcer of left hip, stage 4: Secondary | ICD-10-CM | POA: Diagnosis not present

## 2020-09-08 NOTE — Telephone Encounter (Signed)
Mr. [name] needs an order for home health for continued  wound care. Patient stated he has a wound on his left hip. Per patient he does not have a PCP.  Per patient he was being serviced by [facility] (ph [phone]).  I called and spoke with Demetria RN with [facility]. He has multiple wounds and is not very compliant. But he needs care. He was seen by Dr. Ronda Fairly at [facility] on 09/02/2020 for wound care. (Office note on my desk).   The visit did not go very well.   Please review and advise.  Thank you

## 2020-09-08 NOTE — Telephone Encounter (Signed)
Patient lvm to find out when next appt is. Advd patient that we do not have any follow up appts due to him being seen by another provider at [facility]. Advd patient that his home health nurse has called and has been informed that patient is being treated by [facility] and he declined continuing his care with our office. Patient said he is not being seen by anyone else and his nurse told him he needs to make an appt with [place]. Advd patient we would check with the Provider. Please call patient to advise.

## 2020-09-09 ENCOUNTER — Ambulatory Visit (INDEPENDENT_AMBULATORY_CARE_PROVIDER_SITE_OTHER): Payer: Medicare Other | Admitting: Physician Assistant

## 2020-09-09 ENCOUNTER — Encounter: Payer: Self-pay | Admitting: Physician Assistant

## 2020-09-09 DIAGNOSIS — Z89612 Acquired absence of left leg above knee: Secondary | ICD-10-CM | POA: Diagnosis not present
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
## 2020-09-09 NOTE — Telephone Encounter (Addendum)
When saw pt, he kept "describing the wound" but didn't want me to assess any of them.  Kept them covered with his clothes and dressings-   As the SCI physician, if I can FOLLOW the wound, I'm happy to write for wound care, however pt refused to allow me to see any pf them, and also refused to let Dr Ronda Fairly see as well-   Didn't let anyone address wounds at ALL from [facility]- none of the nursing has been allowed to look at wounds, or change wound VAC or take it off, as they feel it should be done.   I spoke directly with the H/h agency as well as Dr Ronda Fairly.    The one with wound VAC is tunneling- per H/H And inappropriate with wound VAC since it's likely infected/and tunneling wound shouldn't have wound VAC. Spoke to Dr Ronda Fairly at length- pt Nutrition is POOR and won't stop smoking- explained from Dr Kathaleen Grinder point of view, he needs to allow them to see wounds, or they cannot follow them.   I also will not write H/H orders for someone who wil not allow me to follow wounds, and Dr Ronda Fairly is the best wound care physician I've worked with in a long time- he's ideal to follow all wounds- again, pt refusing.  I suggested/and happy to write the order that the wound VAC needs to be removed from pt's L hip- since it's tunneling-   It sounds, behaviorally, as pt is spiraling downwards and refusing medical help, nursing help and any wound care he doesn't agree with-   I'm happy to continue to see pt, but wil not follow his wounds- He needs to work with Dr Ronda Fairly to follow ALL his wounds, and quit smoking- or he will likely get osteomyelitis of the L Hip and his L AKA- and be hospitalized.   He also needs a PCP- I will not do that role for him. Medicare also requires it for referrals.

## 2020-09-21 ENCOUNTER — Telehealth: Payer: Self-pay

## 2020-09-21 NOTE — Telephone Encounter (Signed)
I called patient to schedule an appointment with Dr. Arita Miss per request from home health nurse.  Patient refused appointment.

## 2020-09-22 ENCOUNTER — Other Ambulatory Visit: Payer: Self-pay | Admitting: Physician Assistant

## 2020-09-27 DIAGNOSIS — L89226 Pressure-induced deep tissue damage of left hip: Secondary | ICD-10-CM | POA: Diagnosis not present

## 2020-09-27 DIAGNOSIS — L89224 Pressure ulcer of left hip, stage 4: Secondary | ICD-10-CM | POA: Diagnosis not present

## 2020-09-27 DIAGNOSIS — L97122 Non-pressure chronic ulcer of left thigh with fat layer exposed: Secondary | ICD-10-CM | POA: Diagnosis not present

## 2020-09-27 DIAGNOSIS — T8789 Other complications of amputation stump: Secondary | ICD-10-CM | POA: Diagnosis not present

## 2020-09-29 ENCOUNTER — Ambulatory Visit: Payer: Medicare Other | Admitting: Orthopedic Surgery

## 2020-09-29 ENCOUNTER — Telehealth: Payer: Self-pay | Admitting: Physical Medicine and Rehabilitation

## 2020-09-29 NOTE — Telephone Encounter (Signed)
Rosey Bath with [facility] needs a call back about supplies.  Patient has ordered some supplies and they need a verbal order from the doctor.  Please call them back at [phone].

## 2020-10-03 NOTE — Telephone Encounter (Signed)
I can order them I guess- I do for SOME of the SCI patients- but I haven't for him- it's fine to give a verbal order- ML

## 2020-10-03 NOTE — Telephone Encounter (Signed)
Are you going to be responsible for his supplies or should they be ordered by urologist?

## 2020-10-04 NOTE — Telephone Encounter (Signed)
I have given verbal order for his requests: Skin barrier,  12 fr 16 in straigth caths,  male external caths.  They will fax the orders to be signed.

## 2020-10-13 ENCOUNTER — Other Ambulatory Visit: Payer: Self-pay | Admitting: Physician Assistant

## 2020-10-17 ENCOUNTER — Telehealth: Payer: Self-pay | Admitting: Orthopedic Surgery

## 2020-10-17 NOTE — Telephone Encounter (Signed)
I called patient and advised. 

## 2020-10-17 NOTE — Telephone Encounter (Signed)
Please advise 

## 2020-10-17 NOTE — Telephone Encounter (Signed)
Pt needs a refill on doxycycline. Please give him a call please if you can do the prescription today [phone]

## 2020-10-17 NOTE — Telephone Encounter (Signed)
done

## 2020-10-24 ENCOUNTER — Other Ambulatory Visit: Payer: Self-pay | Admitting: Physician Assistant

## 2020-11-02 ENCOUNTER — Telehealth: Payer: Self-pay

## 2020-11-02 NOTE — Telephone Encounter (Signed)
Pt not in office since 08/2020 please call for appt first.

## 2020-11-02 NOTE — Telephone Encounter (Signed)
Patient called he is requesting rx refill for doxycycline. CB:[phone]

## 2020-11-03 ENCOUNTER — Other Ambulatory Visit: Payer: Self-pay | Admitting: Physician Assistant

## 2020-11-04 ENCOUNTER — Ambulatory Visit (INDEPENDENT_AMBULATORY_CARE_PROVIDER_SITE_OTHER): Payer: Medicare Other | Admitting: Physician Assistant

## 2020-11-04 ENCOUNTER — Encounter: Payer: Self-pay | Admitting: Physician Assistant

## 2020-11-04 ENCOUNTER — Other Ambulatory Visit: Payer: Self-pay

## 2020-11-04 DIAGNOSIS — Z89612 Acquired absence of left leg above knee: Secondary | ICD-10-CM

## 2020-11-04 MED ORDER — DOXYCYCLINE HYCLATE 100 MG PO CAPS: ORAL_CAPSULE | ORAL | 0 refills | Status: DC
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
## 2020-11-09 ENCOUNTER — Ambulatory Visit: Payer: Medicare Other | Admitting: Physician Assistant

## 2020-11-21 ENCOUNTER — Telehealth: Payer: Self-pay

## 2020-11-21 NOTE — Telephone Encounter (Signed)
He needs to discuss this with those following him for wound care at [facility].  I know he was also looking for a referral for a second opinion on his amputation.  I did speak with Dr. Lajoyce Corners about this and he recommended asking who in wound care  [facility] they would send him to for expertise.  Dr. Lajoyce Corners does not know anyone specific in the [facility] system.

## 2020-11-21 NOTE — Telephone Encounter (Signed)
Pt called and would like a refill on his doxycycline

## 2020-11-21 NOTE — Telephone Encounter (Signed)
You saw pt in office 11/04/20 and he is requesting a refill on abx. Please advise.

## 2020-11-21 NOTE — Telephone Encounter (Signed)
I called and lm on vm to advise of message below. To call with any additional questions.

## 2020-11-23 ENCOUNTER — Telehealth: Payer: Self-pay | Admitting: Orthopedic Surgery

## 2020-11-23 NOTE — Telephone Encounter (Signed)
I called and sw pt to advise per [facility] last office visit and phone message to cal the office that he is currently receiving wound care from at [facility]. To call with any other questions.

## 2020-11-23 NOTE — Telephone Encounter (Signed)
Pt called and was asking about his medication refill and said he only had 6 pills left and wanted to know if you guys were going to refill it or not. I seen something was said about him asking whoever he is seeing at [facility]. But he would like to speak with someone about it here. CB [phone]

## 2020-11-29 ENCOUNTER — Ambulatory Visit: Payer: Medicare Other | Admitting: Physician Assistant

## 2020-12-02 ENCOUNTER — Telehealth: Payer: Self-pay | Admitting: Physician Assistant

## 2020-12-02 NOTE — Telephone Encounter (Signed)
LMOM of the below message  

## 2020-12-02 NOTE — Telephone Encounter (Signed)
Pt called stating he had a infection and was given antibiotics but since has been cleared of any infection. Pt states he has about 15 pills left and wants to know if he can stop taking them or does he need to finish the rx through? He also wanted to know if he should be making F/U appts to keep an eye on a suture area that's still a little open?

## 2020-12-02 NOTE — Telephone Encounter (Signed)
He has been followed by wound care in [place].  He does not want any further surgery by Dr. Lajoyce Corners which is he which is what Dr. Lajoyce Corners has recommended.  He was not happy with Dr. Lajoyce Corners and said he was going to have surgery with another surgeon.  We have talked to him about following up with wound care with regards to antibiotics etc. as they are now following him.

## 2020-12-08 ENCOUNTER — Ambulatory Visit (INDEPENDENT_AMBULATORY_CARE_PROVIDER_SITE_OTHER): Payer: Medicare Other | Admitting: Orthopedic Surgery

## 2020-12-08 ENCOUNTER — Encounter: Payer: Self-pay | Admitting: Orthopedic Surgery

## 2020-12-08 DIAGNOSIS — Z89612 Acquired absence of left leg above knee: Secondary | ICD-10-CM | POA: Diagnosis not present

## 2020-12-08 DIAGNOSIS — T8781 Dehiscence of amputation stump: Secondary | ICD-10-CM | POA: Diagnosis not present

## 2020-12-09 ENCOUNTER — Encounter: Payer: Self-pay | Admitting: Orthopedic Surgery

## 2020-12-09 ENCOUNTER — Telehealth: Payer: Self-pay | Admitting: *Deleted

## 2020-12-09 NOTE — Telephone Encounter (Signed)
Mr Renfrew called and asks that Dr Berline Chough please call him. [phone]
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
## 2020-12-09 NOTE — Telephone Encounter (Signed)
Called pt back- he's concerned he doesn't need revision of L AKA- I explained, based on Dr Audrie Lia note, I truly feel he needs to undergo this surgical revision- could actually be life saving.  He voiced no other concerns.

## 2020-12-26 ENCOUNTER — Other Ambulatory Visit: Payer: Self-pay

## 2020-12-26 ENCOUNTER — Encounter: Payer: Self-pay | Admitting: Physical Medicine and Rehabilitation

## 2020-12-26 ENCOUNTER — Encounter
Payer: Medicare Other | Attending: Physical Medicine and Rehabilitation | Admitting: Physical Medicine and Rehabilitation

## 2020-12-26 VITALS — BP 107/75 | HR 92 | Temp 98.6°F | Ht 60.0 in

## 2020-12-26 DIAGNOSIS — L89223 Pressure ulcer of left hip, stage 3: Secondary | ICD-10-CM | POA: Insufficient documentation

## 2020-12-26 DIAGNOSIS — R569 Unspecified convulsions: Secondary | ICD-10-CM | POA: Insufficient documentation

## 2020-12-26 DIAGNOSIS — G8250 Quadriplegia, unspecified: Secondary | ICD-10-CM | POA: Diagnosis present

## 2020-12-26 DIAGNOSIS — Z89612 Acquired absence of left leg above knee: Secondary | ICD-10-CM | POA: Diagnosis not present

## 2020-12-26 DIAGNOSIS — Z89511 Acquired absence of right leg below knee: Secondary | ICD-10-CM | POA: Insufficient documentation

## 2020-12-26 NOTE — Patient Instructions (Signed)
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 pressure ulcer and wound VAC on it- closed to little circle- here for evaluation.   1.  Got supplies from [employer]- [employer] made a mistake and switched back to [place] for catheter supplies.   2.  Wants to not do Dantrolene- also can't have baclofen due to seizures.   3. We discussed LAKA revision- about Dr Lajoyce Corners doing surgery to help with osteomyelitis of L AKA [phone].   4. Suggest checking Depakote level at next lab check by PCP since on Depakote for seizures.   5. Also check CMP/Liver function tests every 6-12 months. Last check 10/21- so is good for now.   6. Discussed how smoking even 3-4 cigarettes/day can reduce blood flow by 50% for 6 hours for 1 cigarette.  Explained why it's essential- suggest not smoking until it's completely healed by Dr Lajoyce Corners.  I would choose neither, but if have to do one, would VAPE over SMOKING cigarettes.   7.  F/U in 3 months- is interested in prosthesis to help with balance on LLE and prevent skin breakdown.
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
## 2020-12-29 ENCOUNTER — Other Ambulatory Visit: Payer: Self-pay | Admitting: Physician Assistant

## 2021-01-02 ENCOUNTER — Telehealth: Payer: Self-pay | Admitting: Neurology

## 2021-01-04 ENCOUNTER — Telehealth: Payer: Self-pay | Admitting: Orthopedic Surgery

## 2021-01-04 NOTE — Telephone Encounter (Signed)
Left a message asking patient to return my call to discuss scheduling left AKA revision.  He did not return my previous messages left in Feb.  According to his chart, he is scheduled to come in to the office this week for a return office visit.

## 2021-01-05 ENCOUNTER — Other Ambulatory Visit: Payer: Self-pay

## 2021-01-05 ENCOUNTER — Ambulatory Visit (INDEPENDENT_AMBULATORY_CARE_PROVIDER_SITE_OTHER): Payer: Medicare Other | Admitting: Orthopedic Surgery

## 2021-01-05 ENCOUNTER — Encounter: Payer: Self-pay | Admitting: Orthopedic Surgery

## 2021-01-05 DIAGNOSIS — Z89612 Acquired absence of left leg above knee: Secondary | ICD-10-CM

## 2021-01-05 DIAGNOSIS — T8781 Dehiscence of amputation stump: Secondary | ICD-10-CM | POA: Diagnosis not present

## 2021-01-05 MED ORDER — DOXYCYCLINE HYCLATE 100 MG PO CAPS: ORAL_CAPSULE | ORAL | 0 refills | Status: DC
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
## 2021-01-07 ENCOUNTER — Inpatient Hospital Stay (HOSPITAL_COMMUNITY): Admission: RE | Admit: 2021-01-07 | Payer: Medicare Other | Source: Ambulatory Visit

## 2021-01-09 ENCOUNTER — Other Ambulatory Visit: Payer: Self-pay | Admitting: Physician Assistant

## 2021-01-09 ENCOUNTER — Other Ambulatory Visit: Payer: Self-pay | Admitting: Neurology

## 2021-01-09 ENCOUNTER — Other Ambulatory Visit (HOSPITAL_COMMUNITY)
Admission: RE | Admit: 2021-01-09 | Discharge: 2021-01-09 | Disposition: A | Discharge status: Home / Self Care | Payer: Medicare Other | Source: Ambulatory Visit | Attending: Orthopedic Surgery | Admitting: Orthopedic Surgery

## 2021-01-09 DIAGNOSIS — Z01812 Encounter for preprocedural laboratory examination: Secondary | ICD-10-CM | POA: Insufficient documentation

## 2021-01-09 DIAGNOSIS — Z20822 Contact with and (suspected) exposure to covid-19: Secondary | ICD-10-CM | POA: Diagnosis not present

## 2021-01-09 DIAGNOSIS — G40009 Localization-related (focal) (partial) idiopathic epilepsy and epileptic syndromes with seizures of localized onset, not intractable, without status epilepticus: Secondary | ICD-10-CM

## 2021-01-09 LAB — SARS CORONAVIRUS 2 (TAT 6-24 HRS): SARS Coronavirus 2: NEGATIVE

## 2021-01-10 ENCOUNTER — Other Ambulatory Visit: Payer: Self-pay

## 2021-01-10 ENCOUNTER — Encounter (HOSPITAL_COMMUNITY): Payer: Self-pay | Admitting: Physician Assistant

## 2021-01-10 ENCOUNTER — Encounter (HOSPITAL_COMMUNITY): Payer: Self-pay | Admitting: Orthopedic Surgery

## 2021-01-10 NOTE — Progress Notes (Signed)
Anesthesia Chart Review: Same day workup  Medical history significant forhistory of cognitive impairment s/p TBI, complete C7 spinal cord injury, spastic quadriparesis, neurogenic bowel/bladder, seizure disorder, bilateral BKA - required revision of L BKA and ultimately underwent AKA 02/10/20 due to non healing.    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 Last seen 12/21/19. Recommended continue depakote and f/u in 8 months.   Will need DOS labs and eval.   EKG 06/25/19: Sinus rhythm. Rate 73. Borderline short PR interval.  Zannie Cove [facility] Short Stay Center/Anesthesiology Phone [phone] 01/10/2021 4:16 PM

## 2021-01-10 NOTE — Progress Notes (Signed)
PCP Wynn Banker, MD Cardiologist -   Chest x-ray -  EKG -  Stress Test -  ECHO -  Cardiac Cath -   COVID TEST- 01/09/21  Anesthesia review: yes   -------------  SDW INSTRUCTIONS:  Your procedure is scheduled on 01/11/21. Please report to [facility] Main Entrance "A" at 08:30 A.M., and check in at the Admitting office. Call this number if you have problems the morning of surgery: [phone]   Remember: Do not eat after midnight the night before your surgery  Clear liquids until 0800 AM morning of surgery.   Medications to take morning of surgery with a sip of water include: divalproex (DEPAKOTE ER)  doxycycline (VIBRAMYCIN)  As of today, STOP taking any Aspirin (unless otherwise instructed by your surgeon), Aleve, Naproxen, Ibuprofen, Motrin, Advil, Goody's, BC's, all herbal medications, fish oil, and all vitamins.    The Morning of Surgery Do not wear jewelry Do not wear lotions, powders, colognes, or deodorant Do not shave 48 hours prior to surgery.   Men may shave face and neck. Do not bring valuables to the hospital. [facility] is not responsible for any belongings or valuables. If you are a smoker, DO NOT Smoke 24 hours prior to surgery If you wear a CPAP at night please bring your mask the morning of surgery  Remember that you must have someone to transport you home after your surgery, and remain with you for 24 hours if you are discharged the same day. Please bring cases for contacts, glasses, hearing aids, dentures or bridgework because it cannot be worn into surgery.   Patients discharged the day of surgery will not be allowed to drive home.   Please shower the NIGHT BEFORE SURGERY and the MORNING OF SURGERY with DIAL Soap. Wear comfortable clothes the morning of surgery. Oral Hygiene is also important to reduce your risk of infection.  Remember - BRUSH YOUR TEETH THE MORNING OF SURGERY WITH YOUR REGULAR TOOTHPASTE  Patient denies shortness of breath, fever,  cough and chest pain.

## 2021-01-10 NOTE — Anesthesia Preprocedure Evaluation (Deleted)
Anesthesia Evaluation  Patient identified by MRN, date of birth, ID band Patient awake    Reviewed: Allergy & Precautions, H&P , NPO status , Patient's Chart, lab work & pertinent test results  Airway Mallampati: II   Neck ROM: full    Dental   Pulmonary Current Smoker,    breath sounds clear to auscultation       Cardiovascular negative cardio ROS   Rhythm:regular Rate:Normal     Neuro/Psych Seizures -,  Harle [employer]; C5 spinal injury  Neuromuscular disease    GI/Hepatic   Endo/Other    Renal/GU      Musculoskeletal   Abdominal   Peds  Hematology   Anesthesia Other Findings   Reproductive/Obstetrics                            Anesthesia Physical Anesthesia Plan  ASA: II  Anesthesia Plan: General   Post-op Pain Management:    Induction: Intravenous  PONV Risk Score and Plan: 1 and Ondansetron, Dexamethasone, Midazolam and Treatment may vary due to age or medical condition  Airway Management Planned: LMA  Additional Equipment:   Intra-op Plan:   Post-operative Plan: Extubation in OR  Informed Consent: I have reviewed the patients History and Physical, chart, labs and discussed the procedure including the risks, benefits and alternatives for the proposed anesthesia with the patient or authorized representative who has indicated his/her understanding and acceptance.     Dental advisory given  Plan Discussed with: CRNA, Anesthesiologist and Surgeon  Anesthesia Plan Comments: (PAT note by Antionette Poles, PA-C: Medical history significant forhistory of cognitive impairment s/p TBI, complete C7 spinal cord injury, spastic quadriparesis, neurogenic bowel/bladder, seizure disorder, bilateral BKA - required revision of L BKA and ultimately underwent AKA 02/10/20 due to non healing.    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 Last seen 12/21/19.  Recommended continue depakote and f/u in 8 months.   Will need DOS labs and eval.   EKG 06/25/19: Sinus rhythm. Rate 73. Borderline short PR interval. )       Anesthesia Quick Evaluation

## 2021-01-11 ENCOUNTER — Ambulatory Visit (HOSPITAL_COMMUNITY)
Admission: RE | Admit: 2021-01-11 | Discharge: 2021-01-11 | Disposition: A | Discharge status: Home / Self Care | Payer: Medicare Other | Attending: Orthopedic Surgery | Admitting: Orthopedic Surgery

## 2021-01-11 ENCOUNTER — Encounter (HOSPITAL_COMMUNITY)
Admission: RE | Disposition: A | Discharge status: Home / Self Care | Payer: Self-pay | Source: Home / Self Care | Attending: Orthopedic Surgery

## 2021-01-11 ENCOUNTER — Encounter (HOSPITAL_COMMUNITY): Payer: Self-pay | Admitting: Orthopedic Surgery

## 2021-01-11 DIAGNOSIS — F17210 Nicotine dependence, cigarettes, uncomplicated: Secondary | ICD-10-CM | POA: Insufficient documentation

## 2021-01-11 DIAGNOSIS — Y835 Amputation of limb(s) as the cause of abnormal reaction of the patient, or of later complication, without mention of misadventure at the time of the procedure: Secondary | ICD-10-CM | POA: Diagnosis not present

## 2021-01-11 DIAGNOSIS — T8781 Dehiscence of amputation stump: Secondary | ICD-10-CM | POA: Insufficient documentation

## 2021-01-11 DIAGNOSIS — G40909 Epilepsy, unspecified, not intractable, without status epilepticus: Secondary | ICD-10-CM | POA: Insufficient documentation

## 2021-01-11 DIAGNOSIS — G8250 Quadriplegia, unspecified: Secondary | ICD-10-CM | POA: Diagnosis not present

## 2021-01-11 DIAGNOSIS — Z881 Allergy status to other antibiotic agents status: Secondary | ICD-10-CM | POA: Diagnosis not present

## 2021-01-11 DIAGNOSIS — Z5329 Procedure and treatment not carried out because of patient's decision for other reasons: Secondary | ICD-10-CM | POA: Insufficient documentation

## 2021-01-11 DIAGNOSIS — Z89612 Acquired absence of left leg above knee: Secondary | ICD-10-CM | POA: Diagnosis not present

## 2021-01-11 SURGERY — REVISION, AMPUTATION SITE
Anesthesia: Choice | Laterality: Left

## 2021-01-11 MED ORDER — ORAL CARE MOUTH RINSE: 15.0000 mL | Freq: Once | OROMUCOSAL | Status: DC

## 2021-01-11 MED ORDER — LACTATED RINGERS IV SOLN: INTRAVENOUS | Status: DC

## 2021-01-11 MED ORDER — CEFAZOLIN SODIUM-DEXTROSE 2-4 GM/100ML-% IV SOLN
2.0000 g | INTRAVENOUS | Status: DC
  Filled 2021-01-11: qty 100

## 2021-01-11 MED ORDER — CHLORHEXIDINE GLUCONATE 0.12 % MT SOLN
15.0000 mL | Freq: Once | OROMUCOSAL | Status: DC
  Filled 2021-01-11: qty 15

## 2021-01-11 NOTE — Progress Notes (Signed)
Pt stated that he consumed half a cup of milk with his meds at 0800. Dr. Chaney Malling made aware and stated that pt would have to be delayed 6 hours which would make surgery time 1400. Pt stated that he can not wait that long and would like to reschedule his surgery. OR desk, anesthesia, and Dr. Lajoyce Corners aware. Pt advised to call office regarding rescheduling. Pt also educated that in the future milk products are not acceptable prior to surgery. Pt verbalized understanding. Pt discharged to home with ride.   Viviano Simas, RN

## 2021-01-11 NOTE — Interval H&P Note (Signed)
History and Physical Interval Note:  01/11/2021 9:31 AM  [name]  has presented today for surgery, with the diagnosis of Osteomyelitis Left Above Knee Amputation.  The various methods of treatment have been discussed with the patient and family. After consideration of risks, benefits and other options for treatment, the patient has consented to  Procedure(s): REVISION LEFT ABOVE KNEE AMPUTATION (Left) as a surgical intervention.  The patient's history has been reviewed, patient examined, no change in status, stable for surgery.  I have reviewed the patient's chart and labs.  Questions were answered to the patient's satisfaction.     [name]
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
## 2021-01-16 ENCOUNTER — Other Ambulatory Visit (HOSPITAL_COMMUNITY)
Admission: RE | Admit: 2021-01-16 | Discharge: 2021-01-16 | Disposition: A | Discharge status: Home / Self Care | Payer: PRIVATE HEALTH INSURANCE | Source: Ambulatory Visit | Attending: Orthopedic Surgery | Admitting: Orthopedic Surgery

## 2021-01-16 ENCOUNTER — Other Ambulatory Visit: Payer: Self-pay | Admitting: Physician Assistant

## 2021-01-16 DIAGNOSIS — Z01812 Encounter for preprocedural laboratory examination: Secondary | ICD-10-CM | POA: Insufficient documentation

## 2021-01-16 DIAGNOSIS — Z20822 Contact with and (suspected) exposure to covid-19: Secondary | ICD-10-CM | POA: Insufficient documentation

## 2021-01-16 LAB — SARS CORONAVIRUS 2 (TAT 6-24 HRS): SARS Coronavirus 2: NEGATIVE

## 2021-01-17 NOTE — Progress Notes (Addendum)
I instructed to arrive at 0900 ,enter at the Main Entrance . Entrance 'A', there you will be directed to the Admitting office.   Do not eat or drink after midnight, no gumming gum, hard candy. You may have clear liquids until 0830, 3 hours prior to surgery.  Clear liquids include:  Water, Carbonated beverages, Black coffee with sugar or sweeter, tea- no cream, Gatorade, plain Popsicle, plain Jello.  Shower/ wash up well  with antibiotic soap. Wash your surgical site thoroughly, rinse, dry off with a clean towel. Do not put on lotions, powders, cologne, deodorant. Do not wear jewelry or piercing's.   If you wear glasses, contacts, hearing aids, dentures or partials- you may not wear them into surgery. Bring a case for glasses or contacts, hearing aids. The hospital will provide a denture cup.  [facility] is not responsible for valuables or personal belongings.   If you are being discharged the day of surgery, you will need someone to drive you home and to stay with you for 24 hours prior to surgery.  Men may shave face and  neck; women -no shaving for 48 hours prior to surgery.  Any questions  problems or if you are running late call - [phone]. (there may not be anyone at the office number I am calling from for days.

## 2021-01-18 ENCOUNTER — Inpatient Hospital Stay (HOSPITAL_COMMUNITY): Payer: PRIVATE HEALTH INSURANCE | Admitting: Anesthesiology

## 2021-01-18 ENCOUNTER — Inpatient Hospital Stay (HOSPITAL_COMMUNITY)
Admission: RE | Admit: 2021-01-18 | Discharge: 2021-01-19 | DRG: 463 | Disposition: A | Discharge status: Home / Self Care | Payer: PRIVATE HEALTH INSURANCE | Attending: Orthopedic Surgery | Admitting: Orthopedic Surgery

## 2021-01-18 ENCOUNTER — Other Ambulatory Visit: Payer: Self-pay

## 2021-01-18 ENCOUNTER — Encounter (HOSPITAL_COMMUNITY): Payer: Self-pay | Admitting: Orthopedic Surgery

## 2021-01-18 ENCOUNTER — Encounter (HOSPITAL_COMMUNITY)
Admission: RE | Disposition: A | Discharge status: Home / Self Care | Payer: Self-pay | Source: Home / Self Care | Attending: Orthopedic Surgery

## 2021-01-18 DIAGNOSIS — Z888 Allergy status to other drugs, medicaments and biological substances status: Secondary | ICD-10-CM | POA: Diagnosis not present

## 2021-01-18 DIAGNOSIS — Z89612 Acquired absence of left leg above knee: Secondary | ICD-10-CM | POA: Diagnosis not present

## 2021-01-18 DIAGNOSIS — T8130XA Disruption of wound, unspecified, initial encounter: Secondary | ICD-10-CM | POA: Diagnosis present

## 2021-01-18 DIAGNOSIS — Z87820 Personal history of traumatic brain injury: Secondary | ICD-10-CM | POA: Diagnosis not present

## 2021-01-18 DIAGNOSIS — Y835 Amputation of limb(s) as the cause of abnormal reaction of the patient, or of later complication, without mention of misadventure at the time of the procedure: Secondary | ICD-10-CM | POA: Diagnosis present

## 2021-01-18 DIAGNOSIS — F17210 Nicotine dependence, cigarettes, uncomplicated: Secondary | ICD-10-CM | POA: Diagnosis present

## 2021-01-18 DIAGNOSIS — G8250 Quadriplegia, unspecified: Secondary | ICD-10-CM | POA: Diagnosis present

## 2021-01-18 DIAGNOSIS — T8781 Dehiscence of amputation stump: Secondary | ICD-10-CM | POA: Diagnosis present

## 2021-01-18 DIAGNOSIS — S14105S Unspecified injury at C5 level of cervical spinal cord, sequela: Secondary | ICD-10-CM | POA: Diagnosis not present

## 2021-01-18 DIAGNOSIS — Z9049 Acquired absence of other specified parts of digestive tract: Secondary | ICD-10-CM

## 2021-01-18 DIAGNOSIS — Z89512 Acquired absence of left leg below knee: Secondary | ICD-10-CM | POA: Diagnosis not present

## 2021-01-18 DIAGNOSIS — M869 Osteomyelitis, unspecified: Secondary | ICD-10-CM | POA: Diagnosis present

## 2021-01-18 DIAGNOSIS — Z20822 Contact with and (suspected) exposure to covid-19: Secondary | ICD-10-CM | POA: Diagnosis present

## 2021-01-18 DIAGNOSIS — G40909 Epilepsy, unspecified, not intractable, without status epilepticus: Secondary | ICD-10-CM | POA: Diagnosis present

## 2021-01-18 DIAGNOSIS — Z89511 Acquired absence of right leg below knee: Secondary | ICD-10-CM | POA: Diagnosis not present

## 2021-01-18 HISTORY — PX: STUMP REVISION: SHX6102

## 2021-01-18 LAB — POCT I-STAT, CHEM 8
BUN: 24 mg/dL — ABNORMAL HIGH (ref 6–20)
Calcium, Ion: 1.16 mmol/L (ref 1.15–1.40)
Chloride: 105 mmol/L (ref 98–111)
Creatinine, Ser: 0.6 mg/dL — ABNORMAL LOW (ref 0.61–1.24)
Glucose, Bld: 86 mg/dL (ref 70–99)
HCT: 43 % (ref 39.0–52.0)
Hemoglobin: 14.6 g/dL (ref 13.0–17.0)
Potassium: 3.7 mmol/L (ref 3.5–5.1)
Sodium: 140 mmol/L (ref 135–145)
TCO2: 25 mmol/L (ref 22–32)

## 2021-01-18 LAB — GLUCOSE, CAPILLARY: Glucose-Capillary: 182 mg/dL — ABNORMAL HIGH (ref 70–99)

## 2021-01-18 SURGERY — REVISION, AMPUTATION SITE
Anesthesia: General | Laterality: Left

## 2021-01-18 MED ORDER — LACTATED RINGERS IV SOLN: INTRAVENOUS | Status: DC

## 2021-01-18 MED ORDER — MIDAZOLAM HCL 2 MG/2ML IJ SOLN
INTRAMUSCULAR | Status: AC
  Filled 2021-01-18: qty 2

## 2021-01-18 MED ORDER — DIVALPROEX SODIUM ER 500 MG PO TB24: 1000.0000 mg | ORAL_TABLET | ORAL | Status: DC

## 2021-01-18 MED ORDER — VITAMIN D (ERGOCALCIFEROL) 1.25 MG (50000 UNIT) PO CAPS
50000.0000 [IU] | ORAL_CAPSULE | Freq: Once | ORAL | Status: DC
  Filled 2021-01-18: qty 1

## 2021-01-18 MED ORDER — DIVALPROEX SODIUM 500 MG PO DR TAB
500.0000 mg | DELAYED_RELEASE_TABLET | Freq: Once | ORAL | Status: AC
  Administered 2021-01-18: 500 mg via ORAL
  Filled 2021-01-18: qty 1

## 2021-01-18 MED ORDER — ACETAMINOPHEN 500 MG PO TABS
1000.0000 mg | ORAL_TABLET | Freq: Once | ORAL | Status: DC
  Filled 2021-01-18: qty 2

## 2021-01-18 MED ORDER — SODIUM CHLORIDE 0.9 % IR SOLN
Status: DC | PRN
  Administered 2021-01-18: 1000 mL

## 2021-01-18 MED ORDER — CHLORHEXIDINE GLUCONATE 0.12 % MT SOLN: 15.0000 mL | Freq: Once | OROMUCOSAL | Status: AC

## 2021-01-18 MED ORDER — LABETALOL HCL 5 MG/ML IV SOLN: 10.0000 mg | INTRAVENOUS | Status: DC | PRN

## 2021-01-18 MED ORDER — SACCHAROMYCES BOULARDII 250 MG PO CAPS
250.0000 mg | ORAL_CAPSULE | Freq: Two times a day (BID) | ORAL | Status: DC
  Administered 2021-01-18 – 2021-01-19 (×2): 250 mg via ORAL
  Filled 2021-01-18 (×2): qty 1

## 2021-01-18 MED ORDER — ACETAMINOPHEN 325 MG PO TABS
325.0000 mg | ORAL_TABLET | Freq: Four times a day (QID) | ORAL | Status: DC | PRN
Start: 2021-01-19 — End: 2021-01-19

## 2021-01-18 MED ORDER — FENTANYL CITRATE (PF) 250 MCG/5ML IJ SOLN
INTRAMUSCULAR | Status: AC
  Filled 2021-01-18: qty 5

## 2021-01-18 MED ORDER — PROPOFOL 10 MG/ML IV BOLUS
INTRAVENOUS | Status: DC | PRN
  Administered 2021-01-18: 30 mg via INTRAVENOUS
  Administered 2021-01-18: 140 mg via INTRAVENOUS

## 2021-01-18 MED ORDER — ACETAMINOPHEN 500 MG PO TABS: 1000.0000 mg | ORAL_TABLET | Freq: Once | ORAL | Status: AC

## 2021-01-18 MED ORDER — FENTANYL CITRATE (PF) 100 MCG/2ML IJ SOLN: 25.0000 ug | INTRAMUSCULAR | Status: DC | PRN

## 2021-01-18 MED ORDER — CEFAZOLIN SODIUM-DEXTROSE 2-4 GM/100ML-% IV SOLN
2.0000 g | Freq: Three times a day (TID) | INTRAVENOUS | Status: AC
  Administered 2021-01-18 – 2021-01-19 (×2): 2 g via INTRAVENOUS
  Filled 2021-01-18 (×2): qty 100

## 2021-01-18 MED ORDER — ALUM & MAG HYDROXIDE-SIMETH 200-200-20 MG/5ML PO SUSP
15.0000 mL | ORAL | Status: DC | PRN
Start: 2021-01-18 — End: 2021-01-19

## 2021-01-18 MED ORDER — DIVALPROEX SODIUM 500 MG PO DR TAB
1500.0000 mg | DELAYED_RELEASE_TABLET | Freq: Every day | ORAL | Status: DC
  Filled 2021-01-18: qty 3

## 2021-01-18 MED ORDER — MAGNESIUM SULFATE 2 GM/50ML IV SOLN
2.0000 g | Freq: Every day | INTRAVENOUS | Status: DC | PRN
Start: 2021-01-18 — End: 2021-01-19
  Filled 2021-01-18: qty 50

## 2021-01-18 MED ORDER — DOCUSATE SODIUM 100 MG PO CAPS
100.0000 mg | ORAL_CAPSULE | Freq: Every day | ORAL | Status: DC
  Filled 2021-01-18: qty 1

## 2021-01-18 MED ORDER — ROCURONIUM BROMIDE 100 MG/10ML IV SOLN
INTRAVENOUS | Status: DC | PRN
  Administered 2021-01-18: 50 mg via INTRAVENOUS

## 2021-01-18 MED ORDER — HYDRALAZINE HCL 20 MG/ML IJ SOLN
5.0000 mg | INTRAMUSCULAR | Status: DC | PRN
Start: 2021-01-18 — End: 2021-01-19

## 2021-01-18 MED ORDER — KCL IN DEXTROSE-NACL 20-5-0.45 MEQ/L-%-% IV SOLN
INTRAVENOUS | Status: DC
  Filled 2021-01-18 (×2): qty 1000

## 2021-01-18 MED ORDER — ORAL CARE MOUTH RINSE: 15.0000 mL | Freq: Once | OROMUCOSAL | Status: AC

## 2021-01-18 MED ORDER — PROPOFOL 10 MG/ML IV BOLUS
INTRAVENOUS | Status: AC
  Filled 2021-01-18: qty 40

## 2021-01-18 MED ORDER — ONDANSETRON HCL 4 MG/2ML IJ SOLN
INTRAMUSCULAR | Status: AC
  Filled 2021-01-18: qty 2

## 2021-01-18 MED ORDER — DEXAMETHASONE SODIUM PHOSPHATE 10 MG/ML IJ SOLN
INTRAMUSCULAR | Status: DC | PRN
  Administered 2021-01-18: 10 mg via INTRAVENOUS

## 2021-01-18 MED ORDER — SUGAMMADEX SODIUM 200 MG/2ML IV SOLN
INTRAVENOUS | Status: DC | PRN
  Administered 2021-01-18: 250 mg via INTRAVENOUS

## 2021-01-18 MED ORDER — HYDROMORPHONE HCL 1 MG/ML IJ SOLN: 0.5000 mg | INTRAMUSCULAR | Status: DC | PRN

## 2021-01-18 MED ORDER — KETOROLAC TROMETHAMINE 15 MG/ML IJ SOLN
15.0000 mg | Freq: Four times a day (QID) | INTRAMUSCULAR | Status: DC
  Administered 2021-01-19 (×2): 15 mg via INTRAVENOUS
  Filled 2021-01-18 (×3): qty 1

## 2021-01-18 MED ORDER — LIDOCAINE 2% (20 MG/ML) 5 ML SYRINGE
INTRAMUSCULAR | Status: AC
  Filled 2021-01-18: qty 5

## 2021-01-18 MED ORDER — MIDAZOLAM HCL 5 MG/5ML IJ SOLN
INTRAMUSCULAR | Status: DC | PRN
  Administered 2021-01-18: 2 mg via INTRAVENOUS

## 2021-01-18 MED ORDER — DIVALPROEX SODIUM 500 MG PO DR TAB
500.0000 mg | DELAYED_RELEASE_TABLET | Freq: Once | ORAL | Status: DC
  Filled 2021-01-18: qty 1

## 2021-01-18 MED ORDER — CHLORHEXIDINE GLUCONATE 0.12 % MT SOLN
OROMUCOSAL | Status: AC
  Administered 2021-01-18: 15 mL via OROMUCOSAL
  Filled 2021-01-18: qty 15

## 2021-01-18 MED ORDER — OXYCODONE HCL 5 MG PO TABS
5.0000 mg | ORAL_TABLET | ORAL | Status: DC | PRN
Start: 2021-01-18 — End: 2021-01-19

## 2021-01-18 MED ORDER — TRANEXAMIC ACID-NACL 1000-0.7 MG/100ML-% IV SOLN
1000.0000 mg | Freq: Once | INTRAVENOUS | Status: AC
  Administered 2021-01-18: 1000 mg via INTRAVENOUS
  Filled 2021-01-18: qty 100

## 2021-01-18 MED ORDER — POTASSIUM CHLORIDE CRYS ER 20 MEQ PO TBCR
20.0000 meq | EXTENDED_RELEASE_TABLET | Freq: Every day | ORAL | Status: DC | PRN
Start: 2021-01-18 — End: 2021-01-19

## 2021-01-18 MED ORDER — DIVALPROEX SODIUM 500 MG PO DR TAB
1000.0000 mg | DELAYED_RELEASE_TABLET | Freq: Every day | ORAL | Status: DC
  Administered 2021-01-19: 1000 mg via ORAL
  Filled 2021-01-18: qty 2

## 2021-01-18 MED ORDER — LIDOCAINE 2% (20 MG/ML) 5 ML SYRINGE
INTRAMUSCULAR | Status: DC | PRN
  Administered 2021-01-18: 60 mg via INTRAVENOUS

## 2021-01-18 MED ORDER — PHENOL 1.4 % MT LIQD: 1.0000 | OROMUCOSAL | Status: DC | PRN

## 2021-01-18 MED ORDER — METOPROLOL TARTRATE 5 MG/5ML IV SOLN: 2.0000 mg | INTRAVENOUS | Status: DC | PRN

## 2021-01-18 MED ORDER — HYDROMORPHONE HCL 1 MG/ML IJ SOLN
INTRAMUSCULAR | Status: AC
  Filled 2021-01-18: qty 0.5

## 2021-01-18 MED ORDER — HYDROMORPHONE HCL 1 MG/ML IJ SOLN
INTRAMUSCULAR | Status: DC | PRN
  Administered 2021-01-18: .5 mg via INTRAVENOUS

## 2021-01-18 MED ORDER — ONDANSETRON HCL 4 MG/2ML IJ SOLN: 4.0000 mg | Freq: Four times a day (QID) | INTRAMUSCULAR | Status: DC | PRN

## 2021-01-18 MED ORDER — ONDANSETRON HCL 4 MG/2ML IJ SOLN: 4.0000 mg | Freq: Once | INTRAMUSCULAR | Status: DC | PRN

## 2021-01-18 MED ORDER — DEXAMETHASONE SODIUM PHOSPHATE 10 MG/ML IJ SOLN
INTRAMUSCULAR | Status: AC
  Filled 2021-01-18: qty 1

## 2021-01-18 MED ORDER — DIVALPROEX SODIUM 500 MG PO DR TAB
1000.0000 mg | DELAYED_RELEASE_TABLET | ORAL | Status: AC
  Administered 2021-01-18: 500 mg via ORAL
  Filled 2021-01-18: qty 2

## 2021-01-18 MED ORDER — PHENYLEPHRINE HCL-NACL 10-0.9 MG/250ML-% IV SOLN
INTRAVENOUS | Status: DC | PRN
  Administered 2021-01-18: 50 ug/min via INTRAVENOUS

## 2021-01-18 MED ORDER — PANTOPRAZOLE SODIUM 40 MG PO TBEC
40.0000 mg | DELAYED_RELEASE_TABLET | Freq: Every day | ORAL | Status: DC
  Filled 2021-01-18: qty 1

## 2021-01-18 MED ORDER — CEFAZOLIN SODIUM-DEXTROSE 2-4 GM/100ML-% IV SOLN
2.0000 g | INTRAVENOUS | Status: AC
  Administered 2021-01-18: 2 g via INTRAVENOUS
  Filled 2021-01-18: qty 100

## 2021-01-18 MED ORDER — ACETAMINOPHEN 500 MG PO TABS
ORAL_TABLET | ORAL | Status: AC
  Administered 2021-01-18: 1000 mg via ORAL
  Filled 2021-01-18: qty 2

## 2021-01-18 MED ORDER — ROCURONIUM BROMIDE 10 MG/ML (PF) SYRINGE
PREFILLED_SYRINGE | INTRAVENOUS | Status: AC
  Filled 2021-01-18: qty 10

## 2021-01-18 MED ORDER — ONDANSETRON HCL 4 MG/2ML IJ SOLN
INTRAMUSCULAR | Status: DC | PRN
  Administered 2021-01-18: 4 mg via INTRAVENOUS

## 2021-01-18 MED ORDER — GUAIFENESIN-DM 100-10 MG/5ML PO SYRP: 15.0000 mL | ORAL_SOLUTION | ORAL | Status: DC | PRN

## 2021-01-18 MED ORDER — FENTANYL CITRATE (PF) 250 MCG/5ML IJ SOLN
INTRAMUSCULAR | Status: DC | PRN
  Administered 2021-01-18: 100 ug via INTRAVENOUS
  Administered 2021-01-18: 50 ug via INTRAVENOUS

## 2021-01-18 SURGICAL SUPPLY — 37 items
BLADE SAW RECIP 87.9 MT (BLADE) ×2 IMPLANT
BLADE SURG 21 STRL SS (BLADE) ×3 IMPLANT
CANISTER WOUND CARE 500ML ATS (WOUND CARE) ×3 IMPLANT
CANISTER WOUNDNEG PRESSURE 500 (CANNISTER) ×2 IMPLANT
COVER SURGICAL LIGHT HANDLE (MISCELLANEOUS) ×3 IMPLANT
COVER WAND RF STERILE (DRAPES) ×3 IMPLANT
DRAPE DERMATAC (DRAPES) ×3 IMPLANT
DRAPE EXTREMITY T 121X128X90 (DISPOSABLE) ×3 IMPLANT
DRAPE HALF SHEET 40X57 (DRAPES) ×3 IMPLANT
DRAPE INCISE IOBAN 66X45 STRL (DRAPES) ×5 IMPLANT
DRAPE U-SHAPE 47X51 STRL (DRAPES) ×6 IMPLANT
DRESSING MEPILEX FLEX 4X4 (GAUZE/BANDAGES/DRESSINGS) ×1 IMPLANT
DRESSING PREVENA PLUS CUSTOM (GAUZE/BANDAGES/DRESSINGS) ×1 IMPLANT
DRSG MEPILEX FLEX 4X4 (GAUZE/BANDAGES/DRESSINGS) ×3
DRSG MEPILEX SACRM 8.7X9.8 (GAUZE/BANDAGES/DRESSINGS) ×2 IMPLANT
DRSG PREVENA PLUS CUSTOM (GAUZE/BANDAGES/DRESSINGS) ×3
DURAPREP 26ML APPLICATOR (WOUND CARE) ×3 IMPLANT
ELECT REM PT RETURN 9FT ADLT (ELECTROSURGICAL) ×3
ELECTRODE REM PT RTRN 9FT ADLT (ELECTROSURGICAL) ×1 IMPLANT
GLOVE BIOGEL PI IND STRL 9 (GLOVE) ×1 IMPLANT
GLOVE BIOGEL PI INDICATOR 9 (GLOVE) ×2
GLOVE SURG ORTHO 9.0 STRL STRW (GLOVE) ×3 IMPLANT
GOWN STRL REUS W/ TWL XL LVL3 (GOWN DISPOSABLE) ×2 IMPLANT
GOWN STRL REUS W/TWL XL LVL3 (GOWN DISPOSABLE) ×6
KIT BASIN OR (CUSTOM PROCEDURE TRAY) ×3 IMPLANT
KIT TURNOVER KIT B (KITS) ×3 IMPLANT
MANIFOLD NEPTUNE II (INSTRUMENTS) ×3 IMPLANT
NS IRRIG 1000ML POUR BTL (IV SOLUTION) ×3 IMPLANT
PACK GENERAL/GYN (CUSTOM PROCEDURE TRAY) ×3 IMPLANT
PAD ARMBOARD 7.5X6 YLW CONV (MISCELLANEOUS) ×3 IMPLANT
PREVENA RESTOR ARTHOFORM 46X30 (CANNISTER) ×3 IMPLANT
PREVENA RESTOR AXIOFORM 29X28 (GAUZE/BANDAGES/DRESSINGS) ×2 IMPLANT
STAPLER VISISTAT 35W (STAPLE) IMPLANT
SUT ETHILON 2 0 PSLX (SUTURE) ×3 IMPLANT
SUT SILK 2 0 (SUTURE) ×3
SUT SILK 2-0 18XBRD TIE 12 (SUTURE) ×1 IMPLANT
TOWEL GREEN STERILE (TOWEL DISPOSABLE) ×3 IMPLANT

## 2021-01-18 NOTE — Op Note (Signed)
01/18/2021  1:25 PM  PATIENT:  [name]    PRE-OPERATIVE DIAGNOSIS:  Osteomyelitis Left Above Knee Amputation With stump dehiscence  POST-OPERATIVE DIAGNOSIS:  Same  PROCEDURE:  REVISION LEFT ABOVE KNEE AMPUTATION With application of Prevena customizable and Arthur form wound VAC.  SURGEON:  [name], MD  PHYSICIAN ASSISTANT:None ANESTHESIA:   General  PREOPERATIVE INDICATIONS:  [name] is a  50 y.o. male with a diagnosis of Osteomyelitis Left Above Knee Amputation who failed conservative measures and elected for surgical management.    The risks benefits and alternatives were discussed with the patient preoperatively including but not limited to the risks of infection, bleeding, nerve injury, cardiopulmonary complications, the need for revision surgery, among others, and the patient was willing to proceed.  OPERATIVE IMPLANTS: Praveena customizable and Arthur form wound VAC.  @ENCIMAGES @  OPERATIVE FINDINGS: Patient had exposed femur approximately 2 cm of femur was resected there was no deep abscess at the surgical margins.  OPERATIVE PROCEDURE: Patient was brought the operating room and underwent a general anesthetic.  After adequate levels anesthesia were obtained patient's left lower extremity was prepped using DuraPrep draped into a sterile field a timeout was called.  A fishmouth incision was made around the ulcerative tissue this was carried sharply down to bone the femoral vessels were clamped and suture ligated with 2-0 silk.  A reciprocating saw was used to resect the distal 2 cm of femur.  The amputation was completed the tissue margins were clear electrocautery was used for hemostasis the wound was irrigated with normal saline.  Deep and superficial fascia layers and skin was closed using 2-0 nylon.  The skin was closed with staples Praveena customizable and form wound VAC was applied this had a good suction fit.  Patient was extubated  taken the PACU in stable condition.  Debridement type: Excisional Debridement  Side: left  Body Location: thigh   Tools used for debridement: scalpel and rongeur  Pre-debridement Wound size (cm):   Length: 1        Width: 1     Depth: 1   Post-debridement Wound size (cm):   Length: 15        Width: 5     Depth: 5   Debridement depth beyond dead/damaged tissue down to healthy viable tissue: yes  Tissue layer involved: skin, subcutaneous tissue, muscle / fascia, bone  Nature of tissue removed: Slough, Necrotic, Devitalized Tissue and Non-viable tissue  Irrigation volume: 1 liter     Irrigation fluid type: Normal Saline       DISCHARGE PLANNING:  Antibiotic duration: 24 hours antibiotics  Weightbearing: Nonweightbearing on the left patient has a right transtibial amputation  Pain medication: Opioid pathway  Dressing care/ Wound VAC: Continue wound VAC for 1 week  Ambulatory devices: Wheelchair or walker  Discharge to: Anticipate admission for overnight and discharge to home tomorrow  Follow-up: In the office 1 week post operative.
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
## 2021-01-18 NOTE — Plan of Care (Signed)

## 2021-01-18 NOTE — Interval H&P Note (Signed)
History and Physical Interval Note:  01/18/2021 12:29 PM  [name]  has presented today for surgery, with the diagnosis of Osteomyelitis Left Above Knee Amputation.  The various methods of treatment have been discussed with the patient and family. After consideration of risks, benefits and other options for treatment, the patient has consented to  Procedure(s): REVISION LEFT ABOVE KNEE AMPUTATION (Left) as a surgical intervention.  The patient's history has been reviewed, patient examined, no change in status, stable for surgery.  I have reviewed the patient's chart and labs.  Questions were answered to the patient's satisfaction.     [name]

## 2021-01-18 NOTE — Anesthesia Postprocedure Evaluation (Signed)
Anesthesia Post Note  Patient: [name]  Procedure(s) Performed: REVISION LEFT ABOVE KNEE AMPUTATION (Left )     Patient location during evaluation: PACU Anesthesia Type: General Level of consciousness: awake and alert Pain management: pain level controlled Vital Signs Assessment: post-procedure vital signs reviewed and stable Respiratory status: spontaneous breathing, nonlabored ventilation and respiratory function stable Cardiovascular status: blood pressure returned to baseline and stable Postop Assessment: no apparent nausea or vomiting Anesthetic complications: no   No complications documented.  Last Vitals:  Vitals:   01/18/21 1500 01/18/21 1515  BP: 121/82 119/85  Pulse: 83 76  Resp: 14 19  Temp: 36.9 C   SpO2: 97% 97%    Last Pain:  Vitals:   01/18/21 1500  TempSrc:   PainSc: 0-No pain                 Cecile Hearing

## 2021-01-18 NOTE — Anesthesia Procedure Notes (Signed)
Procedure Name: Intubation Date/Time: 01/18/2021 12:40 PM Performed by: Hendricks Limes, CRNA Pre-anesthesia Checklist: Patient identified, Emergency Drugs available, Suction available and Patient being monitored Patient Re-evaluated:Patient Re-evaluated prior to induction Oxygen Delivery Method: Circle system utilized Preoxygenation: Pre-oxygenation with 100% oxygen Induction Type: IV induction Ventilation: Mask ventilation without difficulty Laryngoscope Size: Mac and 3 (extremely tight oral opening, hard to open mouth. Able to fit MAC 3 in with head lift ) Grade View: Grade I Tube type: Oral Tube size: 7.5 mm Number of attempts: 1 Airway Equipment and Method: Stylet Placement Confirmation: ETT inserted through vocal cords under direct vision,  positive ETCO2 and breath sounds checked- equal and bilateral Secured at: 22 cm Tube secured with: Tape Dental Injury: Teeth and Oropharynx as per pre-operative assessment

## 2021-01-18 NOTE — Transfer of Care (Signed)
Immediate Anesthesia Transfer of Care Note  Patient: [name]  Procedure(s) Performed: REVISION LEFT ABOVE KNEE AMPUTATION (Left )  Patient Location: PACU  Anesthesia Type:General  Level of Consciousness: awake, alert  and oriented  Airway & Oxygen Therapy: Patient Spontanous Breathing and Patient connected to face mask oxygen  Post-op Assessment: Report given to RN, Post -op Vital signs reviewed and stable and Patient moving all extremities  Post vital signs: Reviewed and stable  Last Vitals:  Vitals Value Taken Time  BP 120/94 01/18/21 1328  Temp    Pulse 76 01/18/21 1328  Resp 13 01/18/21 1328  SpO2 100 % 01/18/21 1328  Vitals shown include unvalidated device data.  Last Pain:  Vitals:   01/18/21 0944  TempSrc:   PainSc: 0-No pain         Complications: No complications documented.
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
## 2021-01-19 ENCOUNTER — Encounter (HOSPITAL_COMMUNITY): Payer: Self-pay | Admitting: Orthopedic Surgery

## 2021-01-19 LAB — CBC
HCT: 41 % (ref 39.0–52.0)
Hemoglobin: 13.4 g/dL (ref 13.0–17.0)
MCH: 29.1 pg (ref 26.0–34.0)
MCHC: 32.7 g/dL (ref 30.0–36.0)
MCV: 89.1 fL (ref 80.0–100.0)
Platelets: 243 10*3/uL (ref 150–400)
RBC: 4.6 MIL/uL (ref 4.22–5.81)
RDW: 14.4 % (ref 11.5–15.5)
WBC: 12.8 10*3/uL — ABNORMAL HIGH (ref 4.0–10.5)
nRBC: 0 % (ref 0.0–0.2)

## 2021-01-19 LAB — BASIC METABOLIC PANEL
Anion gap: 8 (ref 5–15)
BUN: 18 mg/dL (ref 6–20)
CO2: 26 mmol/L (ref 22–32)
Calcium: 8.9 mg/dL (ref 8.9–10.3)
Chloride: 108 mmol/L (ref 98–111)
Creatinine, Ser: 0.85 mg/dL (ref 0.61–1.24)
GFR, Estimated: >60 mL/min (ref >60)
Glucose, Bld: 117 mg/dL — ABNORMAL HIGH (ref 70–99)
Potassium: 4.8 mmol/L (ref 3.5–5.1)
Sodium: 142 mmol/L (ref 135–145)

## 2021-01-19 MED ORDER — OXYCODONE HCL 5 MG PO TABS: 5.0000 mg | ORAL_TABLET | ORAL | 0 refills | Status: DC | PRN

## 2021-01-19 MED ORDER — SODIUM CHLORIDE 0.9 % IV BOLUS
300.0000 mL | Freq: Once | INTRAVENOUS | Status: AC
  Administered 2021-01-19: 300 mL via INTRAVENOUS
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
## 2021-01-19 NOTE — Progress Notes (Addendum)
0205 Pt BP 90/56, P 89, RR 18,. Pt denies any symptoms.RN will continue to monitor pt. Dr Ophelia Charter notified. Awaiting response.  1275 RN received orders.

## 2021-01-19 NOTE — Progress Notes (Signed)
PT Cancellation Note  Patient Details Name: [name] MRN: [id number] DOB: [birth year]   Cancelled Treatment:    Reason Eval/Treat Not Completed: Other (comment).  Pt was attempted but reports he is fine with transfers, does not need any help.  Retry at another time if dc is not done today.   Ivar Drape 01/19/2021, 1:24 PM   Samul Dada, PT MS Acute Rehab Dept. Number: [facility] [phone] and [facility] [phone]

## 2021-01-19 NOTE — Progress Notes (Signed)
OT Cancellation Note  Patient Details Name: [name] MRN: [id number] DOB: November 13, 1970   Cancelled Treatment:    Reason Eval/Treat Not Completed: OT screened, no needs identified, will sign off. Pt with hx of quadriparesis and has assistance with all mobility and ADL tasks. Pt close to baseline level of functioning.   Thornell Mule, OT/L   Acute OT Clinical Specialist Acute Rehabilitation Services Pager [phone] Office [phone]  01/19/2021, 2:47 PM

## 2021-01-19 NOTE — Progress Notes (Signed)
Discharge instructions addressed; Pt in stable condition; on prevena wound vac.Pt was picked up by a family friend at the [employer] entrance.

## 2021-01-19 NOTE — Progress Notes (Signed)
IP rehab admissions - received rehab consult d/t revision of L AKA.  PT/OT evaluations are pending.  Noted DC summary placed today.  Patient reports that he has no rehab needs.  Will follow along until evals completed and/or patient discharged.  Call for questions.  [phone]

## 2021-01-19 NOTE — Discharge Summary (Signed)
Discharge Diagnoses:  Active Problems:   Wound dehiscence   Surgeries: Procedure(s): REVISION LEFT ABOVE KNEE AMPUTATION on 01/18/2021    Consultants:   Discharged Condition: Improved  Hospital Course: Edouard Gikas is an 50 y.o. 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  Patient was brought to the operating room on 01/18/2021 and underwent Procedure(s): REVISION LEFT ABOVE KNEE AMPUTATION.    Patient was given perioperative antibiotics:  Anti-infectives (From admission, onward)   Start     Dose/Rate Route Frequency Ordered Stop   01/18/21 2100  ceFAZolin (ANCEF) IVPB 2g/100 mL premix        2 g 200 mL/hr over 30 Minutes Intravenous Every 8 hours 01/18/21 1602 01/19/21 0459   01/18/21 0930  ceFAZolin (ANCEF) IVPB 2g/100 mL premix        2 g 200 mL/hr over 30 Minutes Intravenous On call to O.R. 01/18/21 0919 01/18/21 1251    .  Patient was given sequential compression devices, early ambulation, and aspirin for DVT prophylaxis.  Recent vital signs:  Patient Vitals for the past 24 hrs:  BP Temp Temp src Pulse Resp SpO2 Height Weight  01/19/21 0507 124/88 98.4 F (36.9 C) Oral 65 18 100 % -- --  01/19/21 0136 (!) 90/56 97.9 F (36.6 C) Oral 89 18 -- -- --  01/19/21 0124 (!) 90/56 97.9 F (36.6 C) -- 89 18 -- -- --  01/18/21 2221 (!) 131/96 98.7 F (37.1 C) Oral 88 18 98 % -- --  01/18/21 1900 104/71 98.7 F (37.1 C) Oral 83 17 96 % -- --  01/18/21 1616 127/76 97.9 F (36.6 C) Oral 88 17 97 % -- --  01/18/21 1600 -- (!) 97.2 F (36.2 C) -- 83 15 96 % -- --  01/18/21 1559 101/64 -- -- 81 11 97 % -- --  01/18/21 1545 111/75 -- -- 81 19 97 % -- --  01/18/21 1530 (!) 123/92 -- -- 77 13 97 % -- --  01/18/21 1515 119/85 -- -- 76 19 97 % -- --  01/18/21 1500 121/82 98.4 F (36.9 C) -- 83 14 97 % -- --  01/18/21 1445 (!) 134/95 -- -- 86 20 98 % -- --   01/18/21 1430 122/80 -- -- 72 12 96 % -- --  01/18/21 1415 124/86 -- -- 75 12 95 % -- --  01/18/21 1400 107/62 -- -- 74 16 95 % -- --  01/18/21 1345 107/66 -- -- 77 13 96 % -- --  01/18/21 1328 (!) 120/94 97.7 F (36.5 C) -- 81 12 100 % -- --  01/18/21 0909 101/64 98.3 F (36.8 C) Oral 83 18 100 % 5\' 5"  (1.651 m) 61.2 kg  .  Recent laboratory studies: No results found.  Discharge Medications:   Allergies as of 01/19/2021      Reactions   Baclofen Other (See Comments)   Can cause seizures, since has epilepsy- lowers seizure threshold      Medication List    STOP taking these medications   doxycycline 100 MG capsule Commonly known as: VIBRAMYCIN     TAKE these medications   divalproex 500 MG 24 hr tablet Commonly known as: Depakote ER TAKE 2 TABLETS BY MOUTH EVERY MORNING AND 3 TABLETS AT NIGHT. What changed:   how much to take  how to take this  when to take this  additional instructions  oxyCODONE 5 MG immediate release tablet Commonly known as: Oxy IR/ROXICODONE Take 1-2 tablets (5-10 mg total) by mouth every 4 (four) hours as needed for moderate pain (pain score 4-6).       Diagnostic Studies: No results found.  Patient benefited maximally from their hospital stay and there were no complications.     Disposition: Discharge disposition: 01-Home or Self Care      Discharge Instructions    Call MD / Call 911   Complete by: As directed    If you experience chest pain or shortness of breath, CALL 911 and be transported to the hospital emergency room.  If you develope a fever above 101 F, pus (white drainage) or increased drainage or redness at the wound, or calf pain, call your surgeon's office.   Constipation Prevention   Complete by: As directed    Drink plenty of fluids.  Prune juice may be helpful.  You may use a stool softener, such as Colace (over the counter) 100 mg twice a day.  Use MiraLax (over the counter) for constipation as needed.   Diet -  low sodium heart healthy   Complete by: As directed    Increase activity slowly as tolerated   Complete by: As directed    Negative Pressure Wound Therapy - Incisional   Complete by: As directed    Show patient how to attach preveena vac      Follow-up Information    Adonis Huguenin, NP In 1 week.   Specialty: Orthopedic Surgery Contact information: [address] [place] [address] [phone]                Signed: [place] Eljay Lave 01/19/2021, 7:24 AM

## 2021-01-20 ENCOUNTER — Ambulatory Visit (INDEPENDENT_AMBULATORY_CARE_PROVIDER_SITE_OTHER): Payer: Medicare Other | Admitting: Physician Assistant

## 2021-01-20 ENCOUNTER — Telehealth: Payer: Self-pay

## 2021-01-20 ENCOUNTER — Encounter: Payer: Self-pay | Admitting: Physician Assistant

## 2021-01-20 DIAGNOSIS — Z89612 Acquired absence of left leg above knee: Secondary | ICD-10-CM
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
## 2021-01-20 NOTE — Telephone Encounter (Signed)
Patient would like to know if he can use a certain soap to clean around the stitches as well?  Patient had left AKA surgery on 01/18/2021.  Cb# [phone].  Please advise.  Thank you.

## 2021-01-23 NOTE — Telephone Encounter (Signed)
Called pt and advised that he can use dial soap in the shower not to submerge the limb in water. Pt also wanted to make follow up appt. Offered to f/u this Friday pt asked for Monday instead appt sch for next Monday at 10am to call with any questions.

## 2021-01-30 ENCOUNTER — Encounter: Payer: Self-pay | Admitting: Orthopedic Surgery

## 2021-01-30 ENCOUNTER — Ambulatory Visit (INDEPENDENT_AMBULATORY_CARE_PROVIDER_SITE_OTHER): Payer: Medicare HMO | Admitting: Orthopedic Surgery

## 2021-01-30 ENCOUNTER — Telehealth: Payer: Self-pay | Admitting: Orthopedic Surgery

## 2021-01-30 DIAGNOSIS — Z89612 Acquired absence of left leg above knee: Secondary | ICD-10-CM
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
## 2021-01-30 NOTE — Telephone Encounter (Signed)
Patient called. He would like someone to call him. Has some questions.

## 2021-01-31 ENCOUNTER — Telehealth: Payer: Self-pay

## 2021-01-31 NOTE — Telephone Encounter (Signed)
[name] has requested a referral to [facility]. He stated, "I would like someone there to look at my leg."  Patient is concerned about  his left amputated leg. Per patient, he is tried of always having someone always cutting on it.  Please refer or advise.    Call back phone number [phone].

## 2021-02-01 ENCOUNTER — Telehealth: Payer: Self-pay | Admitting: Orthopedic Surgery

## 2021-02-01 NOTE — Telephone Encounter (Signed)
Received auth today. Will process

## 2021-02-01 NOTE — Telephone Encounter (Signed)
Received medical records release form from patient    Records need to be faxed to [facility]

## 2021-02-01 NOTE — Telephone Encounter (Signed)
I had called this pt earlier today and he advised that he was told he could come and pick up his medical records. That someone had advised that they would be ready today? Just sending as a heads up. I did not tell the pt this he is wanting to pick up his ecords to go to [facility]. Let me know if I can help.

## 2021-02-01 NOTE — Telephone Encounter (Signed)
Called pt- he said he got everything "taken care of" -didn't want to explain exactly how, but he's comfortable with his plan.  No additional needs.

## 2021-02-06 ENCOUNTER — Encounter: Payer: Self-pay | Admitting: Orthopedic Surgery

## 2021-02-06 ENCOUNTER — Ambulatory Visit (INDEPENDENT_AMBULATORY_CARE_PROVIDER_SITE_OTHER): Payer: Medicare HMO | Admitting: Physician Assistant

## 2021-02-06 DIAGNOSIS — Z89612 Acquired absence of left leg above knee: Secondary | ICD-10-CM

## 2021-02-06 MED ORDER — DOXYCYCLINE HYCLATE 100 MG PO TABS: 100.0000 mg | ORAL_TABLET | Freq: Two times a day (BID) | ORAL | 0 refills | Status: DC
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
## 2021-02-13 ENCOUNTER — Encounter: Payer: Self-pay | Admitting: Orthopedic Surgery

## 2021-02-13 ENCOUNTER — Ambulatory Visit (INDEPENDENT_AMBULATORY_CARE_PROVIDER_SITE_OTHER): Payer: Medicare HMO | Admitting: Physician Assistant

## 2021-02-13 VITALS — Ht 65.0 in | Wt 135.0 lb

## 2021-02-13 DIAGNOSIS — Z89612 Acquired absence of left leg above knee: Secondary | ICD-10-CM
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
## 2021-02-20 ENCOUNTER — Ambulatory Visit: Payer: Medicare HMO | Admitting: Neurology

## 2021-02-27 ENCOUNTER — Ambulatory Visit (INDEPENDENT_AMBULATORY_CARE_PROVIDER_SITE_OTHER): Payer: Medicare HMO | Admitting: Orthopedic Surgery

## 2021-02-27 ENCOUNTER — Encounter: Payer: Self-pay | Admitting: Orthopedic Surgery

## 2021-02-27 DIAGNOSIS — Z89511 Acquired absence of right leg below knee: Secondary | ICD-10-CM

## 2021-02-27 DIAGNOSIS — Z89612 Acquired absence of left leg above knee: Secondary | ICD-10-CM
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
## 2021-03-21 ENCOUNTER — Telehealth: Payer: Self-pay

## 2021-03-21 NOTE — Telephone Encounter (Signed)
Patient called stating that he is currently at [facility] and needs a Rx faxed for prosthetic for left AKA.  Fax# [phone]. CB# [phone]. Please advise.  Thank you.

## 2021-03-22 NOTE — Telephone Encounter (Signed)
done

## 2021-03-22 NOTE — Telephone Encounter (Signed)
Can you please write rx as requested below.

## 2021-03-27 ENCOUNTER — Encounter: Payer: Medicare HMO | Attending: Physical Medicine and Rehabilitation | Admitting: Physical Medicine and Rehabilitation

## 2021-03-27 ENCOUNTER — Other Ambulatory Visit: Payer: Self-pay

## 2021-03-27 ENCOUNTER — Encounter: Payer: Self-pay | Admitting: Physical Medicine and Rehabilitation

## 2021-03-27 VITALS — BP 100/71 | HR 90 | Temp 99.2°F | Ht 65.0 in | Wt 136.0 lb

## 2021-03-27 DIAGNOSIS — G8250 Quadriplegia, unspecified: Secondary | ICD-10-CM | POA: Diagnosis not present

## 2021-03-27 DIAGNOSIS — Z89612 Acquired absence of left leg above knee: Secondary | ICD-10-CM

## 2021-03-27 DIAGNOSIS — Z89511 Acquired absence of right leg below knee: Secondary | ICD-10-CM | POA: Diagnosis not present
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
## 2021-03-28 NOTE — Progress Notes (Unsigned)
This encounter was created in error - please disregard.

## 2021-04-18 ENCOUNTER — Telehealth: Payer: Self-pay

## 2021-04-18 NOTE — Telephone Encounter (Signed)
Patient called he is requesting orders for prosthetic to be faxed to [facility] patient is there now and is requesting order to be faxed by 1:00 call back:[phone]

## 2021-04-19 NOTE — Telephone Encounter (Signed)
Can you please write rx for pt for [facility]

## 2021-04-25 ENCOUNTER — Telehealth: Payer: Self-pay | Admitting: Orthopedic Surgery

## 2021-04-25 NOTE — Telephone Encounter (Signed)
02/27/21 ov note faxed to [facility]-advised pt calling in 6/28 requesting order for new prosthetic

## 2021-05-01 ENCOUNTER — Ambulatory Visit: Payer: Medicare HMO | Admitting: Orthopedic Surgery

## 2021-05-02 ENCOUNTER — Ambulatory Visit: Payer: Medicare HMO | Admitting: Family

## 2021-05-15 ENCOUNTER — Ambulatory Visit: Payer: Medicare Other | Admitting: Neurology

## 2021-08-23 ENCOUNTER — Telehealth: Payer: Self-pay

## 2021-08-23 NOTE — Telephone Encounter (Signed)
[name] wanted to know if you know of any volunteer programs he can join?    Call back phone [phone].   (I advised him contact his Child psychotherapist. He stated he does not have a Child psychotherapist).

## 2021-08-24 NOTE — Telephone Encounter (Signed)
Patient informed. 

## 2021-09-27 ENCOUNTER — Encounter: Payer: Medicare HMO | Attending: Physical Medicine and Rehabilitation | Admitting: Physical Medicine and Rehabilitation

## 2021-09-27 ENCOUNTER — Encounter: Payer: Self-pay | Admitting: Physical Medicine and Rehabilitation

## 2021-09-27 ENCOUNTER — Other Ambulatory Visit: Payer: Self-pay

## 2021-09-27 VITALS — BP 92/59 | HR 83

## 2021-09-27 DIAGNOSIS — G8250 Quadriplegia, unspecified: Secondary | ICD-10-CM | POA: Diagnosis present

## 2021-09-27 DIAGNOSIS — L89223 Pressure ulcer of left hip, stage 3: Secondary | ICD-10-CM

## 2021-09-27 DIAGNOSIS — Z89511 Acquired absence of right leg below knee: Secondary | ICD-10-CM | POA: Diagnosis present

## 2021-09-27 DIAGNOSIS — Z993 Dependence on wheelchair: Secondary | ICD-10-CM | POA: Diagnosis present

## 2021-09-27 DIAGNOSIS — Z89612 Acquired absence of left leg above knee: Secondary | ICD-10-CM | POA: Diagnosis present
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
## 2021-10-18 ENCOUNTER — Telehealth: Payer: Self-pay

## 2021-10-18 ENCOUNTER — Other Ambulatory Visit: Payer: Self-pay | Admitting: Neurology

## 2021-10-18 ENCOUNTER — Telehealth: Payer: Self-pay | Admitting: Neurology

## 2021-10-18 DIAGNOSIS — G40009 Localization-related (focal) (partial) idiopathic epilepsy and epileptic syndromes with seizures of localized onset, not intractable, without status epilepticus: Secondary | ICD-10-CM

## 2021-10-18 NOTE — Telephone Encounter (Signed)
Refill sent to health dept, and they told [name] they need the paper work for the assistant program for depakote

## 2021-10-18 NOTE — Telephone Encounter (Signed)
Called patient and he stated that he was calling about a refill for Depakote and it was already sent. Patient stated that he didn't have a question about the Depakote assistance program but wanted to know when he was getting his medication. I informed patient that we have sent it to the pharmacy and that he can call his pharmacy to inquire about when he will get it. Patient is scheduled for a f/u on 10/31/21. Patient had no further questions or concerns.

## 2021-10-18 NOTE — Telephone Encounter (Signed)
Patient called and stated he called [facility] and they stated they do not do wheelchair parts anymore. He wants to know if there is somewhere else you were referring to that will come to his home. Please advise.

## 2021-10-31 ENCOUNTER — Ambulatory Visit (INDEPENDENT_AMBULATORY_CARE_PROVIDER_SITE_OTHER): Payer: Medicare Other | Admitting: Neurology

## 2021-10-31 ENCOUNTER — Other Ambulatory Visit: Payer: Self-pay

## 2021-10-31 ENCOUNTER — Encounter: Payer: Self-pay | Admitting: Neurology

## 2021-10-31 VITALS — BP 81/62 | HR 84

## 2021-10-31 DIAGNOSIS — G40009 Localization-related (focal) (partial) idiopathic epilepsy and epileptic syndromes with seizures of localized onset, not intractable, without status epilepticus: Secondary | ICD-10-CM | POA: Diagnosis not present

## 2021-10-31 MED ORDER — DIVALPROEX SODIUM ER 500 MG PO TB24: ORAL_TABLET | ORAL | 11 refills | Status: AC

## 2021-10-31 NOTE — Progress Notes (Signed)
Pt bp is low at visit MD informed, pt skips lunch every day but stated he does drink water, he said is home health nurse told him that pts in a wheel chair needs to skip a meal so they will not get fat. He stated that has has been doing it for years so he will not gain weight after being told that. Pt advised he needs to eat 3 meals but health meals and he would be ok because his blood pressure is low,
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
## 2021-11-13 ENCOUNTER — Other Ambulatory Visit: Payer: Self-pay | Admitting: Neurology

## 2021-11-13 DIAGNOSIS — G40009 Localization-related (focal) (partial) idiopathic epilepsy and epileptic syndromes with seizures of localized onset, not intractable, without status epilepticus: Secondary | ICD-10-CM

## 2021-12-17 ENCOUNTER — Emergency Department (HOSPITAL_COMMUNITY): Payer: Medicare Other

## 2021-12-17 ENCOUNTER — Encounter (HOSPITAL_COMMUNITY): Payer: Self-pay | Admitting: Emergency Medicine

## 2021-12-17 ENCOUNTER — Inpatient Hospital Stay (HOSPITAL_COMMUNITY)
Admission: EM | Admit: 2021-12-17 | Discharge: 2021-12-19 | DRG: 981 | Disposition: A | Discharge status: Home / Self Care | Payer: Medicare Other | Attending: Family Medicine | Admitting: Family Medicine

## 2021-12-17 ENCOUNTER — Other Ambulatory Visit: Payer: Self-pay

## 2021-12-17 DIAGNOSIS — D649 Anemia, unspecified: Secondary | ICD-10-CM | POA: Diagnosis not present

## 2021-12-17 DIAGNOSIS — L89154 Pressure ulcer of sacral region, stage 4: Secondary | ICD-10-CM | POA: Diagnosis present

## 2021-12-17 DIAGNOSIS — L89324 Pressure ulcer of left buttock, stage 4: Secondary | ICD-10-CM | POA: Diagnosis present

## 2021-12-17 DIAGNOSIS — L0231 Cutaneous abscess of buttock: Secondary | ICD-10-CM | POA: Diagnosis present

## 2021-12-17 DIAGNOSIS — Z20822 Contact with and (suspected) exposure to covid-19: Secondary | ICD-10-CM | POA: Diagnosis present

## 2021-12-17 DIAGNOSIS — L89209 Pressure ulcer of unspecified hip, unspecified stage: Secondary | ICD-10-CM

## 2021-12-17 DIAGNOSIS — Z89511 Acquired absence of right leg below knee: Secondary | ICD-10-CM | POA: Diagnosis not present

## 2021-12-17 DIAGNOSIS — E440 Moderate protein-calorie malnutrition: Secondary | ICD-10-CM | POA: Diagnosis present

## 2021-12-17 DIAGNOSIS — Z9049 Acquired absence of other specified parts of digestive tract: Secondary | ICD-10-CM

## 2021-12-17 DIAGNOSIS — G40009 Localization-related (focal) (partial) idiopathic epilepsy and epileptic syndromes with seizures of localized onset, not intractable, without status epilepticus: Secondary | ICD-10-CM | POA: Diagnosis present

## 2021-12-17 DIAGNOSIS — L89223 Pressure ulcer of left hip, stage 3: Secondary | ICD-10-CM | POA: Diagnosis present

## 2021-12-17 DIAGNOSIS — Z993 Dependence on wheelchair: Secondary | ICD-10-CM

## 2021-12-17 DIAGNOSIS — R569 Unspecified convulsions: Secondary | ICD-10-CM | POA: Diagnosis not present

## 2021-12-17 DIAGNOSIS — Z8249 Family history of ischemic heart disease and other diseases of the circulatory system: Secondary | ICD-10-CM | POA: Diagnosis not present

## 2021-12-17 DIAGNOSIS — F17210 Nicotine dependence, cigarettes, uncomplicated: Secondary | ICD-10-CM | POA: Diagnosis present

## 2021-12-17 DIAGNOSIS — F0280 Dementia in other diseases classified elsewhere without behavioral disturbance: Secondary | ICD-10-CM | POA: Diagnosis not present

## 2021-12-17 DIAGNOSIS — N319 Neuromuscular dysfunction of bladder, unspecified: Secondary | ICD-10-CM | POA: Diagnosis present

## 2021-12-17 DIAGNOSIS — I96 Gangrene, not elsewhere classified: Principal | ICD-10-CM | POA: Diagnosis present

## 2021-12-17 DIAGNOSIS — W12XXXS Fall on and from scaffolding, sequela: Secondary | ICD-10-CM | POA: Diagnosis present

## 2021-12-17 DIAGNOSIS — K592 Neurogenic bowel, not elsewhere classified: Secondary | ICD-10-CM | POA: Diagnosis present

## 2021-12-17 DIAGNOSIS — Z89612 Acquired absence of left leg above knee: Secondary | ICD-10-CM | POA: Diagnosis not present

## 2021-12-17 DIAGNOSIS — G252 Other specified forms of tremor: Secondary | ICD-10-CM | POA: Diagnosis present

## 2021-12-17 DIAGNOSIS — L89159 Pressure ulcer of sacral region, unspecified stage: Secondary | ICD-10-CM | POA: Diagnosis not present

## 2021-12-17 DIAGNOSIS — G8250 Quadriplegia, unspecified: Secondary | ICD-10-CM | POA: Diagnosis present

## 2021-12-17 DIAGNOSIS — R419 Unspecified symptoms and signs involving cognitive functions and awareness: Secondary | ICD-10-CM | POA: Diagnosis present

## 2021-12-17 DIAGNOSIS — L0291 Cutaneous abscess, unspecified: Principal | ICD-10-CM | POA: Diagnosis present

## 2021-12-17 DIAGNOSIS — Z888 Allergy status to other drugs, medicaments and biological substances status: Secondary | ICD-10-CM

## 2021-12-17 DIAGNOSIS — S069X9S Unspecified intracranial injury with loss of consciousness of unspecified duration, sequela: Secondary | ICD-10-CM | POA: Diagnosis not present

## 2021-12-17 DIAGNOSIS — F22 Delusional disorders: Secondary | ICD-10-CM | POA: Diagnosis present

## 2021-12-17 DIAGNOSIS — S069XAS Unspecified intracranial injury with loss of consciousness status unknown, sequela: Secondary | ICD-10-CM

## 2021-12-17 DIAGNOSIS — L8990 Pressure ulcer of unspecified site, unspecified stage: Secondary | ICD-10-CM | POA: Insufficient documentation

## 2021-12-17 DIAGNOSIS — Z79899 Other long term (current) drug therapy: Secondary | ICD-10-CM

## 2021-12-17 DIAGNOSIS — Z89512 Acquired absence of left leg below knee: Secondary | ICD-10-CM

## 2021-12-17 DIAGNOSIS — A1801 Tuberculosis of spine: Secondary | ICD-10-CM | POA: Diagnosis not present

## 2021-12-17 DIAGNOSIS — S14109S Unspecified injury at unspecified level of cervical spinal cord, sequela: Secondary | ICD-10-CM | POA: Diagnosis not present

## 2021-12-17 DIAGNOSIS — Z87898 Personal history of other specified conditions: Secondary | ICD-10-CM

## 2021-12-17 DIAGNOSIS — S14109A Unspecified injury at unspecified level of cervical spinal cord, initial encounter: Secondary | ICD-10-CM | POA: Diagnosis present

## 2021-12-17 DIAGNOSIS — Z6822 Body mass index (BMI) 22.0-22.9, adult: Secondary | ICD-10-CM

## 2021-12-17 DIAGNOSIS — Z720 Tobacco use: Secondary | ICD-10-CM | POA: Diagnosis present

## 2021-12-17 DIAGNOSIS — S069XAA Unspecified intracranial injury with loss of consciousness status unknown, initial encounter: Secondary | ICD-10-CM | POA: Diagnosis present

## 2021-12-17 HISTORY — DX: Dehiscence of amputation stump: T87.81

## 2021-12-17 LAB — CBC WITH DIFFERENTIAL/PLATELET
Abs Immature Granulocytes: 0.09 10*3/uL — ABNORMAL HIGH (ref 0.00–0.07)
Basophils Absolute: 0.1 10*3/uL (ref 0.0–0.1)
Basophils Relative: 1 %
Eosinophils Absolute: 0.2 10*3/uL (ref 0.0–0.5)
Eosinophils Relative: 1 %
HCT: 41.7 % (ref 39.0–52.0)
Hemoglobin: 13.7 g/dL (ref 13.0–17.0)
Immature Granulocytes: 1 %
Lymphocytes Relative: 20 %
Lymphs Abs: 2.5 10*3/uL (ref 0.7–4.0)
MCH: 30.6 pg (ref 26.0–34.0)
MCHC: 32.9 g/dL (ref 30.0–36.0)
MCV: 93.3 fL (ref 80.0–100.0)
Monocytes Absolute: 1.9 10*3/uL — ABNORMAL HIGH (ref 0.1–1.0)
Monocytes Relative: 15 %
Neutro Abs: 8.1 10*3/uL — ABNORMAL HIGH (ref 1.7–7.7)
Neutrophils Relative %: 62 %
Platelets: 374 10*3/uL (ref 150–400)
RBC: 4.47 MIL/uL (ref 4.22–5.81)
RDW: 13.5 % (ref 11.5–15.5)
WBC: 12.9 10*3/uL — ABNORMAL HIGH (ref 4.0–10.5)
nRBC: 0 % (ref 0.0–0.2)

## 2021-12-17 LAB — SEDIMENTATION RATE: Sed Rate: 60 mm/hr — ABNORMAL HIGH (ref 0–16)

## 2021-12-17 LAB — BASIC METABOLIC PANEL
Anion gap: 11 (ref 5–15)
BUN: 19 mg/dL (ref 6–20)
CO2: 23 mmol/L (ref 22–32)
Calcium: 8.4 mg/dL — ABNORMAL LOW (ref 8.9–10.3)
Chloride: 100 mmol/L (ref 98–111)
Creatinine, Ser: 0.67 mg/dL (ref 0.61–1.24)
GFR, Estimated: >60 mL/min (ref >60)
Glucose, Bld: 89 mg/dL (ref 70–99)
Potassium: 4.1 mmol/L (ref 3.5–5.1)
Sodium: 134 mmol/L — ABNORMAL LOW (ref 135–145)

## 2021-12-17 LAB — RESP PANEL BY RT-PCR (FLU A&B, COVID) ARPGX2
Influenza A by PCR: NEGATIVE
Influenza B by PCR: NEGATIVE
SARS Coronavirus 2 by RT PCR: NEGATIVE

## 2021-12-17 LAB — C-REACTIVE PROTEIN: CRP: 11.4 mg/dL — ABNORMAL HIGH (ref <1.0)

## 2021-12-17 MED ORDER — VANCOMYCIN HCL IN DEXTROSE 1-5 GM/200ML-% IV SOLN
1000.0000 mg | Freq: Two times a day (BID) | INTRAVENOUS | Status: DC
  Administered 2021-12-18: 1000 mg via INTRAVENOUS
  Filled 2021-12-17 (×3): qty 200

## 2021-12-17 MED ORDER — DIVALPROEX SODIUM ER 500 MG PO TB24
500.0000 mg | ORAL_TABLET | Freq: Every day | ORAL | Status: DC
  Administered 2021-12-17 – 2021-12-18 (×2): 500 mg via ORAL
  Filled 2021-12-17 (×3): qty 1

## 2021-12-17 MED ORDER — DIVALPROEX SODIUM ER 500 MG PO TB24
1000.0000 mg | ORAL_TABLET | Freq: Two times a day (BID) | ORAL | Status: DC
  Administered 2021-12-18 – 2021-12-19 (×3): 1000 mg via ORAL
  Filled 2021-12-17 (×6): qty 2

## 2021-12-17 MED ORDER — NICOTINE 21 MG/24HR TD PT24
21.0000 mg | MEDICATED_PATCH | Freq: Every day | TRANSDERMAL | Status: DC
  Administered 2021-12-18: 21 mg via TRANSDERMAL
  Filled 2021-12-17: qty 1

## 2021-12-17 MED ORDER — POLYETHYLENE GLYCOL 3350 17 G PO PACK: 17.0000 g | PACK | Freq: Every day | ORAL | Status: DC | PRN

## 2021-12-17 MED ORDER — ACETAMINOPHEN 650 MG RE SUPP: 650.0000 mg | Freq: Four times a day (QID) | RECTAL | Status: DC | PRN

## 2021-12-17 MED ORDER — IOHEXOL 300 MG/ML  SOLN
100.0000 mL | Freq: Once | INTRAMUSCULAR | Status: AC | PRN
  Administered 2021-12-17: 100 mL via INTRAVENOUS

## 2021-12-17 MED ORDER — AQUAPHOR EX OINT: 1.0000 "application " | TOPICAL_OINTMENT | Freq: Every day | CUTANEOUS | Status: DC | PRN

## 2021-12-17 MED ORDER — SODIUM CHLORIDE 0.9 % IV SOLN
2.0000 g | Freq: Three times a day (TID) | INTRAVENOUS | Status: DC
  Administered 2021-12-17 – 2021-12-18 (×3): 2 g via INTRAVENOUS
  Filled 2021-12-17 (×3): qty 2

## 2021-12-17 MED ORDER — VANCOMYCIN HCL 1250 MG/250ML IV SOLN
1250.0000 mg | Freq: Once | INTRAVENOUS | Status: AC
  Administered 2021-12-17: 1250 mg via INTRAVENOUS
  Filled 2021-12-17: qty 250

## 2021-12-17 MED ORDER — ACETAMINOPHEN 325 MG PO TABS
650.0000 mg | ORAL_TABLET | Freq: Four times a day (QID) | ORAL | Status: DC | PRN
  Administered 2021-12-19: 650 mg via ORAL
  Filled 2021-12-17: qty 2

## 2021-12-17 NOTE — Progress Notes (Addendum)
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 Labs for AM ordered, which was adjusted as needed.  - Plan for surgical intervention/debridement - Continuing IV antibiotics  Rise Patience, DO 12/18/2021, 12:03 AM PGY-2, [facility] Night Resident  Please page [phone] with questions.
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
## 2021-12-17 NOTE — Progress Notes (Signed)
Took off all old dressings, measured and dressed all pressure injury areas. Measurements in flowsheets. The "new" pressure injury of the left inner gluteal fold is unstageable, 75% covered in eschar, there was an area 2 cm deep that I packed with moist to dry normal saline gauze and covered the whole area with gauze. Pt informed he will be NPO after MN for possible surgery by [facility] in the am. Pt is very pleasant and talkative. He has his wheelchair in the room. Pt is on a air loss bed which we obtained prior to pt coming up from the ED.

## 2021-12-17 NOTE — Progress Notes (Signed)
Pt admitted to 6N02 from ED and placed on a air loss bed for pressure injuries. Pt alert and oriented x 4 and talkative. Pt has Left AKA and right BKA, has a condom cath on. Will provide assessment of all pressure injuries.

## 2021-12-17 NOTE — ED Provider Notes (Signed)
[facility] EMERGENCY DEPARTMENT Provider Note   CSN: [id number] Arrival date & time: 12/17/21  1228     History  Chief Complaint  Patient presents with   Laceration    [name] is a 51 y.o. male with past medical history significant for cervical spinal cord injury, quadriplegia, quadriparesis, history of neurogenic bladder, neurogenic bowel who presents with concern for pressure injury of the buttocks.  Patient reports that he has had a new pressure injury for around 3 weeks to 1 month.  Patient reports that he thinks he may have scraped his wheelchair which initiated the injury.  He denies any fever, chills, nausea, vomiting.  He denies having this evaluated at his normal wound care clinic.  He reports that he keeps it covered with a pressure ulcer cream, and pressure ulcer pads but it is not improving.   Laceration     Home Medications Prior to Admission medications   Medication Sig Start Date End Date Taking? Authorizing Provider  ciprofloxacin (CIPRO) 500 MG tablet Take 500 mg by mouth 2 (two) times daily. 08/22/21   [provider]  divalproex (DEPAKOTE ER) 500 MG 24 hr tablet TAKE 2 TABLETS BY MOUTH EVERY MORNING, 1 TABLET IN AFTERNOON, AND 2 TABLETS AT NIGHT. 10/31/21   Cameron Sprang, MD      Allergies    Baclofen    Review of Systems   Review of Systems  Skin:  Positive for wound.  All other systems reviewed and are negative.  Physical Exam Updated Vital Signs BP 129/78    Pulse 91    Temp 99.1 F (37.3 C) (Oral)    Resp 16    Ht _0  (1.651 m)    Wt 61.7 kg    SpO2 97%    BMI 22.63 kg/m  Physical Exam Vitals and nursing note reviewed.  Constitutional:      General: He is not in acute distress.    Appearance: Normal appearance.     Comments: Patient with above-the-knee amputation on left, BKA on right, no acute distress  HENT:     Head: Normocephalic and atraumatic.  Eyes:     General:        Right eye: No  discharge.        Left eye: No discharge.  Cardiovascular:     Rate and Rhythm: Normal rate and regular rhythm.  Pulmonary:     Effort: Pulmonary effort is normal. No respiratory distress.  Musculoskeletal:        General: No deformity.  Skin:    General: Skin is warm and dry.     Comments: 1 healing pressure ulcer in the lateral aspect of the sacrum previously noted, and being seen by wound management, has bandage in place, no surrounding erythema, it is bandaged with pressure ulcer cream.  1 new, at least stage III foul-smelling pressure ulcer of the left lateral aspect of the gluteal cleft with surrounding redness, erythema.  Please see photo in media for further evaluation.  Neurological:     Mental Status: He is alert and oriented to person, place, and time.  Psychiatric:        Mood and Affect: Mood normal.        Behavior: Behavior normal.     ED Results / Procedures / Treatments   Labs (all labs ordered are listed, but only abnormal results are displayed) Labs Reviewed  CBC WITH DIFFERENTIAL/PLATELET - Abnormal; Notable for the following components:  Result Value   WBC 12.9 (*)    Neutro Abs 8.1 (*)    Monocytes Absolute 1.9 (*)    Abs Immature Granulocytes 0.09 (*)    All other components within normal limits  BASIC METABOLIC PANEL - Abnormal; Notable for the following components:   Sodium 134 (*)    Calcium 8.4 (*)    All other components within normal limits  SEDIMENTATION RATE - Abnormal; Notable for the following components:   Sed Rate 60 (*)    All other components within normal limits  C-REACTIVE PROTEIN - Abnormal; Notable for the following components:   CRP 11.4 (*)    All other components within normal limits  RESP PANEL BY RT-PCR (FLU A&B, COVID) [id number]    EKG None  Radiology CT ABDOMEN PELVIS W CONTRAST  Result Date: 12/17/2021 CLINICAL DATA:  Intra-abdominal abscess. Severe decubitus ulcer of the sacrum. Concern for tracking infection. EXAM:  CT ABDOMEN AND PELVIS WITH CONTRAST TECHNIQUE: Multidetector CT imaging of the abdomen and pelvis was performed using the standard protocol following bolus administration of intravenous contrast. RADIATION DOSE REDUCTION: This exam was performed according to the departmental dose-optimization program which includes automated exposure control, adjustment of the mA and/or kV according to patient size and/or use of iterative reconstruction technique. CONTRAST:  128m OMNIPAQUE IOHEXOL 300 MG/ML  SOLN COMPARISON:  No priors available FINDINGS: Lower chest: No acute abnormality. Hepatobiliary: Liver is unremarkable.  Gallbladder is contracted. Pancreas: Unremarkable. No pancreatic ductal dilatation or surrounding inflammatory changes. Spleen: Normal in size without focal abnormality. Adrenals/Urinary Tract: Adrenal glands are normal. RIGHT kidney: There are 2 sub centimeters cyst in the RIGHT kidney. No solid masses. No hydronephrosis. RIGHT ureter is normal. LEFT kidney: LEFT kidney and ureter are unremarkable. No hydronephrosis. Bladder is unremarkable.  A prostatic urethral stent is present. Stomach/Bowel: Stomach is unremarkable. Small bowel loops are normal. The appendix is well seen and normal in appearance. Loops of colon are unremarkable. Moderate stool burden. Vascular/Lymphatic: No significant vascular findings are present. No enlarged abdominal or pelvic lymph nodes. Reproductive: Prostate and seminal vesicles have a normal appearance. Prostatic urethral stent in place. Other: An abscess superficial to the LEFT ischial spine measures 5.0 x 3.8 centimeters. This abscess is contiguous with a ulceration in the skin. The abscess tracks to the ischial spine with there is no cortical erosion. There is no extension of the abscess into the peritoneal cavity. Surrounding this collection, there is subcutaneous edema. There is edema superficial to the RIGHT iliac spine, not associated with fluid collection.  Musculoskeletal: Posttraumatic deformity of the LEFT hip, significant heterotopic bone. Degenerative changes in the LOWER lumbar spine. IMPRESSION: 1. 5.0 x 3.8 centimeter abscess superficial to the LEFT ischial spine, associated with ulceration in the skin. 2. The abscess tracks to the ischial spine without evidence for osteomyelitis. 3. No extension of abscess into the peritoneal cavity. 4. Posttraumatic deformity of the LEFT hip. Electronically Signed   By: ENolon NationsM.D.   On: 12/17/2021 15:12    Procedures Procedures    Medications Ordered in ED Medications  vancomycin (VANCOREADY) IVPB 1250 mg/250 mL (has no administration in time range)  ceFEPIme (MAXIPIME) 2 g in sodium chloride 0.9 % 100 mL IVPB (2 g Intravenous New Bag/Given 12/17/21 1525)  vancomycin (VANCOCIN) IVPB 1000 mg/200 mL premix (has no administration in time range)  iohexol (OMNIPAQUE) 300 MG/ML solution 100 mL (100 mLs Intravenous Contrast Given 12/17/21 1438)    ED Course/ Medical Decision Making/ A&P Clinical  Course as of 12/17/21 1544  Sun Dec 17, 2021  1511 [place] with Family Medicine to admit [CP]    Clinical Course User Index [CP] Anselmo Pickler, PA-C                           Medical Decision Making  I discussed this case with my attending physician who cosigned this note including patient's presenting symptoms, physical exam, and planned diagnostics and interventions. Attending physician stated agreement with plan or made changes to plan which were implemented  This patient presents to the ED for concern of sacral ulcer, this involves an extensive number of treatment options, and is a complaint that carries with it a high risk of complications and morbidity. The emergent differential diagnosis prior to evaluation includes, but is not limited to, stage 3-4 sacral decubitus ulcer, with possible osteomyelitis, possible abscess formation.  Additionally, along with above concern for overt bacteremia,  septicemia.  Past Medical History / Co-morbidities: Cervical spinal cord injury, seizure history, quadriplegia and quadriparesis, neurogenic bladder, hx of right BKA, left AKA, hx of previous decubitus ulcer injuries requiring hospitalization  Additional history:  External records from outside source obtained and reviewed including recent outpatient wound care notes. A different ulcer is being currently managed and evaluated and they did not evaluate this new wound at all 3 days ago per patient.  Physical Exam: Physical exam performed. The pertinent findings include: sacral ulcer injury of at least stage 3 as described in physical exam and photos above  Lab Tests: I ordered, and personally interpreted labs.  The pertinent results include:  leukocytosis with WBC of 12.9, BMP overall unremarkable, very mild hyponatremia of 134. Negative RVP. Elevated sed rate and CRP.   Imaging Studies: I ordered imaging studies including CT abdomen and pelvis. I independently visualized and interpreted imaging which showed 5x3.8 abscess of the ischial spine associated with pressure injury on skin. I agree with the radiologist interpretation.   Concern for infected stage 3+ decubitus ulcer, concern for developing osteomyelitis. in Context of CT with evidence of abscess formation, elevated CRP, ESR, and elevated white blood cell count ongoing concern that patient may be developing osteomyelitis.  We will consult for admission.  Spoke with Dr. Joelyn Oms with family medicine who agrees to admission at this time.   Final Clinical Impression(s) / ED Diagnoses Final diagnoses:  None    Rx / DC Orders ED Discharge Orders     None         Dorien Chihuahua 12/17/21 1545    Kommor, Debe Coder, MD 12/17/21 1625
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
## 2021-12-17 NOTE — ED Triage Notes (Signed)
Pt reports laceration on his anal area x 1 month. States he is unsure if he scraped it on his wheelchair or what caused it.
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
## 2021-12-17 NOTE — ED Notes (Signed)
Patient transported to CT 
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
## 2021-12-18 ENCOUNTER — Inpatient Hospital Stay (HOSPITAL_COMMUNITY): Payer: Medicare Other | Admitting: Certified Registered Nurse Anesthetist

## 2021-12-18 ENCOUNTER — Encounter (HOSPITAL_COMMUNITY)
Admission: EM | Disposition: A | Discharge status: Home / Self Care | Payer: Self-pay | Source: Home / Self Care | Attending: Family Medicine

## 2021-12-18 ENCOUNTER — Encounter (HOSPITAL_COMMUNITY): Payer: Self-pay | Admitting: Family Medicine

## 2021-12-18 ENCOUNTER — Other Ambulatory Visit: Payer: Self-pay

## 2021-12-18 DIAGNOSIS — S14109S Unspecified injury at unspecified level of cervical spinal cord, sequela: Secondary | ICD-10-CM

## 2021-12-18 DIAGNOSIS — Z89612 Acquired absence of left leg above knee: Secondary | ICD-10-CM

## 2021-12-18 DIAGNOSIS — G40009 Localization-related (focal) (partial) idiopathic epilepsy and epileptic syndromes with seizures of localized onset, not intractable, without status epilepticus: Secondary | ICD-10-CM

## 2021-12-18 DIAGNOSIS — A1801 Tuberculosis of spine: Secondary | ICD-10-CM

## 2021-12-18 DIAGNOSIS — L0231 Cutaneous abscess of buttock: Secondary | ICD-10-CM | POA: Diagnosis not present

## 2021-12-18 DIAGNOSIS — L89223 Pressure ulcer of left hip, stage 3: Secondary | ICD-10-CM

## 2021-12-18 DIAGNOSIS — D649 Anemia, unspecified: Secondary | ICD-10-CM

## 2021-12-18 DIAGNOSIS — F0280 Dementia in other diseases classified elsewhere without behavioral disturbance: Secondary | ICD-10-CM

## 2021-12-18 DIAGNOSIS — L89159 Pressure ulcer of sacral region, unspecified stage: Secondary | ICD-10-CM

## 2021-12-18 DIAGNOSIS — L89324 Pressure ulcer of left buttock, stage 4: Secondary | ICD-10-CM

## 2021-12-18 DIAGNOSIS — Z720 Tobacco use: Secondary | ICD-10-CM

## 2021-12-18 DIAGNOSIS — G8250 Quadriplegia, unspecified: Secondary | ICD-10-CM | POA: Diagnosis not present

## 2021-12-18 DIAGNOSIS — Z89511 Acquired absence of right leg below knee: Secondary | ICD-10-CM

## 2021-12-18 DIAGNOSIS — S069X9S Unspecified intracranial injury with loss of consciousness of unspecified duration, sequela: Secondary | ICD-10-CM

## 2021-12-18 DIAGNOSIS — K592 Neurogenic bowel, not elsewhere classified: Secondary | ICD-10-CM

## 2021-12-18 DIAGNOSIS — S069XAS Unspecified intracranial injury with loss of consciousness status unknown, sequela: Secondary | ICD-10-CM

## 2021-12-18 DIAGNOSIS — N319 Neuromuscular dysfunction of bladder, unspecified: Secondary | ICD-10-CM

## 2021-12-18 DIAGNOSIS — R569 Unspecified convulsions: Secondary | ICD-10-CM

## 2021-12-18 HISTORY — PX: INCISION AND DRAINAGE OF WOUND: SHX1803

## 2021-12-18 HISTORY — PX: INCISION AND DRAINAGE ABSCESS: SHX5864

## 2021-12-18 HISTORY — DX: Pressure ulcer of left buttock, stage 4: L89.324

## 2021-12-18 LAB — COMPREHENSIVE METABOLIC PANEL
ALT: 7 U/L (ref 0–44)
AST: 13 U/L — ABNORMAL LOW (ref 15–41)
Albumin: 2.1 g/dL — ABNORMAL LOW (ref 3.5–5.0)
Alkaline Phosphatase: 57 U/L (ref 38–126)
Anion gap: 9 (ref 5–15)
BUN: 17 mg/dL (ref 6–20)
CO2: 25 mmol/L (ref 22–32)
Calcium: 8.5 mg/dL — ABNORMAL LOW (ref 8.9–10.3)
Chloride: 102 mmol/L (ref 98–111)
Creatinine, Ser: 0.75 mg/dL (ref 0.61–1.24)
GFR, Estimated: >60 mL/min (ref >60)
Glucose, Bld: 77 mg/dL (ref 70–99)
Potassium: 4.2 mmol/L (ref 3.5–5.1)
Sodium: 136 mmol/L (ref 135–145)
Total Bilirubin: 0.4 mg/dL (ref 0.3–1.2)
Total Protein: 6.7 g/dL (ref 6.5–8.1)

## 2021-12-18 LAB — HIV ANTIBODY (ROUTINE TESTING W REFLEX): HIV Screen 4th Generation wRfx: NONREACTIVE

## 2021-12-18 LAB — CBC
HCT: 41 % (ref 39.0–52.0)
Hemoglobin: 13.7 g/dL (ref 13.0–17.0)
MCH: 30.7 pg (ref 26.0–34.0)
MCHC: 33.4 g/dL (ref 30.0–36.0)
MCV: 91.9 fL (ref 80.0–100.0)
Platelets: 354 10*3/uL (ref 150–400)
RBC: 4.46 MIL/uL (ref 4.22–5.81)
RDW: 13.5 % (ref 11.5–15.5)
WBC: 10.3 10*3/uL (ref 4.0–10.5)
nRBC: 0 % (ref 0.0–0.2)

## 2021-12-18 LAB — SURGICAL PCR SCREEN
MRSA, PCR: NEGATIVE
Staphylococcus aureus: NEGATIVE

## 2021-12-18 SURGERY — INCISION AND DRAINAGE, ABSCESS
Anesthesia: General | Site: Buttocks

## 2021-12-18 MED ORDER — ONDANSETRON HCL 4 MG/2ML IJ SOLN
INTRAMUSCULAR | Status: DC | PRN
  Administered 2021-12-18: 4 mg via INTRAVENOUS

## 2021-12-18 MED ORDER — PROPOFOL 10 MG/ML IV BOLUS
INTRAVENOUS | Status: AC
  Filled 2021-12-18: qty 20

## 2021-12-18 MED ORDER — DEXAMETHASONE SODIUM PHOSPHATE 10 MG/ML IJ SOLN
INTRAMUSCULAR | Status: DC | PRN
  Administered 2021-12-18: 5 mg via INTRAVENOUS

## 2021-12-18 MED ORDER — HYDROMORPHONE HCL 1 MG/ML IJ SOLN: 0.2500 mg | INTRAMUSCULAR | Status: DC | PRN

## 2021-12-18 MED ORDER — MIDAZOLAM HCL 5 MG/5ML IJ SOLN
INTRAMUSCULAR | Status: DC | PRN
  Administered 2021-12-18: 2 mg via INTRAVENOUS

## 2021-12-18 MED ORDER — LACTATED RINGERS IV SOLN: INTRAVENOUS | Status: DC | PRN

## 2021-12-18 MED ORDER — MIDAZOLAM HCL 2 MG/2ML IJ SOLN
INTRAMUSCULAR | Status: AC
  Filled 2021-12-18: qty 2

## 2021-12-18 MED ORDER — DEXAMETHASONE SODIUM PHOSPHATE 10 MG/ML IJ SOLN
INTRAMUSCULAR | Status: AC
  Filled 2021-12-18: qty 1

## 2021-12-18 MED ORDER — FENTANYL CITRATE (PF) 250 MCG/5ML IJ SOLN
INTRAMUSCULAR | Status: DC | PRN
  Administered 2021-12-18: 50 ug via INTRAVENOUS

## 2021-12-18 MED ORDER — 0.9 % SODIUM CHLORIDE (POUR BTL) OPTIME
TOPICAL | Status: DC | PRN
  Administered 2021-12-18: 1000 mL

## 2021-12-18 MED ORDER — SUGAMMADEX SODIUM 200 MG/2ML IV SOLN
INTRAVENOUS | Status: DC | PRN
Start: 2021-12-18 — End: 2021-12-18
  Administered 2021-12-18: 150 mg via INTRAVENOUS

## 2021-12-18 MED ORDER — LIDOCAINE 2% (20 MG/ML) 5 ML SYRINGE
INTRAMUSCULAR | Status: DC | PRN
  Administered 2021-12-18: 60 mg via INTRAVENOUS

## 2021-12-18 MED ORDER — PROPOFOL 10 MG/ML IV BOLUS
INTRAVENOUS | Status: DC | PRN
  Administered 2021-12-18: 120 mg via INTRAVENOUS

## 2021-12-18 MED ORDER — ZINC SULFATE 220 (50 ZN) MG PO CAPS
220.0000 mg | ORAL_CAPSULE | Freq: Every day | ORAL | Status: DC
  Administered 2021-12-18 – 2021-12-19 (×2): 220 mg via ORAL
  Filled 2021-12-18 (×2): qty 1

## 2021-12-18 MED ORDER — ONDANSETRON HCL 4 MG/2ML IJ SOLN
INTRAMUSCULAR | Status: AC
  Filled 2021-12-18: qty 2

## 2021-12-18 MED ORDER — PHENYLEPHRINE 40 MCG/ML (10ML) SYRINGE FOR IV PUSH (FOR BLOOD PRESSURE SUPPORT)
PREFILLED_SYRINGE | INTRAVENOUS | Status: DC | PRN
  Administered 2021-12-18 (×2): 120 ug via INTRAVENOUS

## 2021-12-18 MED ORDER — ACETAMINOPHEN 500 MG PO TABS
ORAL_TABLET | ORAL | Status: AC
  Administered 2021-12-18: 1000 mg
  Filled 2021-12-18: qty 2

## 2021-12-18 MED ORDER — PHENYLEPHRINE HCL-NACL 20-0.9 MG/250ML-% IV SOLN
INTRAVENOUS | Status: DC | PRN
  Administered 2021-12-18: 50 ug/min via INTRAVENOUS

## 2021-12-18 MED ORDER — MUPIROCIN 2 % EX OINT
1.0000 "application " | TOPICAL_OINTMENT | Freq: Two times a day (BID) | CUTANEOUS | Status: DC
  Administered 2021-12-18 – 2021-12-19 (×2): 1 via NASAL
  Filled 2021-12-18 (×2): qty 22

## 2021-12-18 MED ORDER — ROCURONIUM BROMIDE 10 MG/ML (PF) SYRINGE
PREFILLED_SYRINGE | INTRAVENOUS | Status: DC | PRN
  Administered 2021-12-18: 40 mg via INTRAVENOUS

## 2021-12-18 MED ORDER — FENTANYL CITRATE (PF) 250 MCG/5ML IJ SOLN
INTRAMUSCULAR | Status: AC
  Filled 2021-12-18: qty 5

## 2021-12-18 SURGICAL SUPPLY — 31 items
BAG COUNTER SPONGE SURGICOUNT (BAG) ×1 IMPLANT
BAG SPNG CNTER NS LX DISP (BAG)
BNDG GAUZE ELAST 4 BULKY (GAUZE/BANDAGES/DRESSINGS) IMPLANT
CANISTER SUCT 3000ML PPV (MISCELLANEOUS) ×2 IMPLANT
COVER SURGICAL LIGHT HANDLE (MISCELLANEOUS) ×2 IMPLANT
DRAPE LAPAROSCOPIC ABDOMINAL (DRAPES) ×1 IMPLANT
DRSG PAD ABDOMINAL 8X10 ST (GAUZE/BANDAGES/DRESSINGS) IMPLANT
ELECT CAUTERY BLADE 6.4 (BLADE) ×2 IMPLANT
ELECT REM PT RETURN 9FT ADLT (ELECTROSURGICAL) ×2
ELECTRODE REM PT RTRN 9FT ADLT (ELECTROSURGICAL) ×1 IMPLANT
GAUZE PACKING IODOFORM 1X5 (PACKING) ×1 IMPLANT
GAUZE SPONGE 4X4 12PLY STRL (GAUZE/BANDAGES/DRESSINGS) ×1 IMPLANT
GLOVE SRG 8 PF TXTR STRL LF DI (GLOVE) ×1 IMPLANT
GLOVE SURG SYN 7.5  E (GLOVE) ×2
GLOVE SURG SYN 7.5 E (GLOVE) ×1 IMPLANT
GLOVE SURG SYN 7.5 PF PI (GLOVE) ×1 IMPLANT
GLOVE SURG UNDER POLY LF SZ8 (GLOVE) ×2
GOWN STRL REUS W/ TWL LRG LVL3 (GOWN DISPOSABLE) ×1 IMPLANT
GOWN STRL REUS W/TWL LRG LVL3 (GOWN DISPOSABLE) ×2
HANDPIECE INTERPULSE COAX TIP (DISPOSABLE)
KIT BASIN OR (CUSTOM PROCEDURE TRAY) ×2 IMPLANT
KIT TURNOVER KIT B (KITS) ×2 IMPLANT
NS IRRIG 1000ML POUR BTL (IV SOLUTION) ×2 IMPLANT
PACK GENERAL/GYN (CUSTOM PROCEDURE TRAY) ×2 IMPLANT
PAD ARMBOARD 7.5X6 YLW CONV (MISCELLANEOUS) ×2 IMPLANT
PENCIL SMOKE EVACUATOR (MISCELLANEOUS) ×1 IMPLANT
SET HNDPC FAN SPRY TIP SCT (DISPOSABLE) IMPLANT
SWAB COLLECTION DEVICE MRSA (MISCELLANEOUS) IMPLANT
SWAB CULTURE ESWAB REG 1ML (MISCELLANEOUS) IMPLANT
TOWEL GREEN STERILE (TOWEL DISPOSABLE) ×2 IMPLANT
TOWEL GREEN STERILE FF (TOWEL DISPOSABLE) ×2 IMPLANT

## 2021-12-18 NOTE — Anesthesia Procedure Notes (Signed)
Procedure Name: Intubation Date/Time: 12/18/2021 7:58 AM Performed by: Colin Benton, CRNA Pre-anesthesia Checklist: Patient identified, Emergency Drugs available, Suction available and Patient being monitored Patient Re-evaluated:Patient Re-evaluated prior to induction Oxygen Delivery Method: Circle system utilized Preoxygenation: Pre-oxygenation with 100% oxygen Induction Type: IV induction Ventilation: Mask ventilation without difficulty Laryngoscope Size: Miller and 2 Grade View: Grade I Tube type: Oral Tube size: 7.5 mm Number of attempts: 1 Airway Equipment and Method: Stylet Placement Confirmation: ETT inserted through vocal cords under direct vision, positive ETCO2 and breath sounds checked- equal and bilateral Secured at: 23 cm Tube secured with: Tape Dental Injury: Teeth and Oropharynx as per pre-operative assessment

## 2021-12-18 NOTE — TOC Initial Note (Addendum)
Transition of Care [facility]) - Initial/Assessment Note    Patient Details  Name: [name] MRN: [id number] Date of Birth: 1971/02/14  [facility] of [facility]) CM/SW Contact:    Marilu Favre, RN Phone Number: 12/18/2021, 12:46 PM  Clinical Narrative:                  Asked by Dr Joelyn Oms to order [employer] cushion for patient's wheel chair. Same ordered with Freda Munro with [employer].They do not keep the ROHO cushions at hospital. They will review chart and call patient directly for delivery.    Patient received a personnel call  while NCM was in room ,patient hung up on person.   Spoke to patient at bedside. Patient stated there is nothing wrong with his cushion , the wheels are to big. NCM tried to discuss further with patient what agency and when he received his wheel chair , patient stated its all documented.   Patient lives with his mother at home and does not have any home health at present    For [employer] to provide the [facility] cushion they will need brand serial number and size of wheel chair.     NCM left a message with [employer] [phone]    Barriers to Discharge: Continued Medical Work up   Patient Goals and CMS Choice Patient states their goals for this hospitalization and ongoing recovery are:: to return to home CMS Medicare.gov Compare Post Acute Care list provided to:: Patient    Expected Discharge Plan and Services     Discharge Planning Services: CM Consult   Living arrangements for the past 2 months: Single Family Home                 DME Arranged: Other see comment (ROHO wheel chair cushion) DME Agency: [employer] Date DME Agency Contacted: 12/18/21 Time DME Agency Contacted: [phone] Representative spoke with at DME Agency: Freda Munro            Prior Living Arrangements/Services Living arrangements for the past 2 months: [place] Lives with:: Parents Patient language and need for interpreter reviewed:: Yes Do you feel safe going back to  the place where you live?: Yes      Need for Family Participation in Patient Care: Yes (Comment) Care giver support system in place?: Yes (comment) Current home services: DME Criminal Activity/Legal Involvement Pertinent to Current Situation/Hospitalization: No - Comment as needed  Activities of Daily Living      Permission Sought/Granted   Permission granted to share information with : No              Emotional Assessment Appearance:: Appears stated age Attitude/Demeanor/Rapport: Self-Confident Affect (typically observed): Agitated Orientation: : Oriented to Self, Oriented to Place, Oriented to  Time, Oriented to Situation Alcohol / Substance Use: Not Applicable Psych Involvement: No (comment)  Admission diagnosis:  Abscess [L02.91] Patient Active Problem List   Diagnosis Date Noted   Pressure ulcer of ischium, left, stage IV ([place]) 12/18/2021   Abscess of buttock, left 12/18/2021   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 ([place])    Pressure ulcer of hip, stage IV 12/17/2021   Wheelchair dependence 09/27/2021   Hx of AKA (above knee amputation), left ([place]) 08/31/2020   Malnutrition of moderate degree 12/31/2019   Pressure injury of left hip, stage 3 ([place]) 12/30/2019   Bacterial UTI 12/23/2015   Hx of right BKA ([place]) 12/23/2015   Acute blood loss  anemia 12/13/2015   TBI (traumatic brain injury) 10/23/2015   Other incomplete lesion at C7 level of cervical spinal cord, sequela ([place]) 07/25/2015   Chronic spastic tetraplegia ([place]) 07/25/2015   Neurogenic bladder 08/25/2013   Neurogenic bowel 08/25/2013   Quadriplegia and quadriparesis ([place]) 08/12/2013   Epilepsy, generalized, convulsive ([place]) 08/12/2013   Fever 02/06/2013   Seizures ([place]) 03/20/2012   Tobacco use 03/20/2012   Cervical spinal cord injury ([place]) 10/22/1997   Localization-related (focal) (partial) idiopathic epilepsy and epileptic syndromes with  seizures of localized onset, not intractable, without status epilepticus ([place]) 10/22/1997   PCP:  System, Provider Not In Pharmacy:   [facility] [place], [place] [address] Phone: [phone] Fax: [place] [place] [address] Phone: [phone] Fax: [phone]     Social Determinants of Health (SDOH) Interventions    Readmission Risk Interventions No flowsheet data found.

## 2021-12-18 NOTE — Op Note (Signed)
12/18/2021  8:19 AM  PATIENT:  [name]  51 y.o. male  PRE-OPERATIVE DIAGNOSIS:  sacral decubitus ulcer/abscess  POST-OPERATIVE DIAGNOSIS:  sacral decubitus ulcer  PROCEDURE:  Procedure(s): INCISION AND DRAINAGE OF POST-SACRAL ABSCESS (N/A) DEBRIDEMENT OF SACRAL DECUBITUS WOUND (N/A) WOUND 4X4CM  SURGEON:  Surgeon(s) and Role:    * Axel Filler, MD - Primary   ANESTHESIA:   general  EBL:  minimal   BLOOD ADMINISTERED:none  DRAINS: none   LOCAL MEDICATIONS USED:  NONE  SPECIMEN:  No Specimen  DISPOSITION OF SPECIMEN:  N/A  COUNTS:  YES  TOURNIQUET:  * No tourniquets in log *  DICTATION: .Dragon Dictation  Indication procedure: Patient is a 51 year old male with a left hip decubitus ulcer.  Area appeared to have an area of infection deep to the eschar.  This was seen on CT scan.  Patient was taken back to the operating for excision of eschar and drainage of abscess.  Findings: Patient had a 4 x 4 centimeter area that was debrided.  There was a superficial eschar.  Dissection was taken down to the base there was no purulence that was found.  There was some necrotic tissue that was found deep.  This was excised in its entirety.  The area was then packed with 1 inch iodoform gauze.  Details of procedure: After the patient was consented he was taken back to the OR and placed in supine position with bilateral SCDs in place.  He was placed under general endotracheal intubation.  He was then placed in decubitus position.  He was then prepped and draped in standard fashion.  Timeout was called and all facts verified.  At this time a #10 blade was used to excise eschar.  This was done circumferentially.  Dissection was taken down to the muscle at this point.  There was some minimal necrotic tissue deep to this area.  There is no purulence that was expressed.  At this time the area was measured.  This was 4 x 4 cm in size.  Cautery was used to maintain the  stasis.  At this time 1 inch unformed gauze was then placed into the wound.  This was dressed with 4 x 4's and tape.  Patient taught the procedure well was taken to the recovery in stable condition.     PLAN OF CARE: Admit to inpatient   PATIENT DISPOSITION:  PACU - hemodynamically stable.   Delay start of Pharmacological VTE agent (>24hrs) due to surgical blood loss or risk of bleeding: not applicable

## 2021-12-18 NOTE — Discharge Instructions (Addendum)
WOUND CARE: - left hip/buttock dressing to be changed twice daily - supplies: saline, 4x4 gauze, scissors, ABD pad, tape OR clean cotton underwear to hold gauze in place. - remove dressing and all packing carefully - clean edges of skin around the wound with water/gauze, making sure there is no tape debris or leakage left on skin that could cause skin irritation or breakdown. - dampen a clean gauze with saline and pack wound from wound base to skin level, making sure to take note of any possible areas of wound tracking, tunneling and packing appropriately. Wound can be packed loosely.  - cover wound with a dry ABD pad and secure with tape or clean underwear - write the date/time on the dry dressing/tape to better track when the last dressing change occurred. - apply any skin protectant/powder recommended by clinician to protect skin/skin folds. - change dressing as needed if leakage occurs, wound gets contaminated, or patient requests to shower. - patient may shower daily with wound open and following the shower the wound should be dried and a clean dressing placed.    Dear [name],   Thank you so much for allowing [place] to be part of your care!  You were admitted to [facility] for complications with the ulcer on your left hip. Surgery was able to clean it out successfully.   POST-HOSPITAL & CARE INSTRUCTIONS Be sure that you are packing the wound as instructed by the surgery team. This will be key to promoting adequate healing. You can also continue following up with the wound care specialists at [facility] and they can help make recommendations as well.  We recommend that you reach out to [employer], the company that provided your wheelchair, and ask for a ROHO cushion which should help prevent these type wounds in the future. Their number is [phone] Please let PCP/Specialists know of any changes that were made.  Please see medications section of this packet for any  medication changes.   DOCTOR'S APPOINTMENT & FOLLOW UP CARE INSTRUCTIONS  Future Appointments  Date Time Provider Department Center  01/24/2022 10:40 AM Genice Rouge, MD [facility] CPR  10/24/2022  3:00 PM [name], MD [facility] None    RETURN PRECAUTIONS:   Take care and be well!  Family Medicine Teaching Service  [place]  [facility]  [address] [place], [place] [address] [phone]
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
## 2021-12-18 NOTE — Consult Note (Signed)
WOC consult requested for sacrum wound prior to surgical team involvement.  Pt is currently in the OR for debridement of necrotic wound.  Please refer to their team for further questions regarding plan of care. Please re-consult if further assistance is needed.  Thank-you,  Cammie Mcgee MSN, RN, CWOCN, [place], CNS [phone]
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
## 2021-12-18 NOTE — Transfer of Care (Signed)
Immediate Anesthesia Transfer of Care Note  Patient: [name]  Procedure(s) Performed: INCISION AND DRAINAGE OF POST-SACRAL ABSCESS (Buttocks) DEBRIDEMENT OF SACRAL DECUBITUS WOUND (Buttocks)  Patient Location: PACU  Anesthesia Type:General  Level of Consciousness: drowsy  Airway & Oxygen Therapy: Patient Spontanous Breathing and Patient connected to nasal cannula oxygen  Post-op Assessment: Report given to RN and Post -op Vital signs reviewed and stable  Post vital signs: Reviewed and stable  Last Vitals:  Vitals Value Taken Time  BP 98/65 12/18/21 0834  Temp    Pulse 68 12/18/21 0837  Resp 23 12/18/21 0837  SpO2 96 % 12/18/21 0837  Vitals shown include unvalidated device data.  Last Pain:  Vitals:   12/18/21 0448  TempSrc: Oral  PainSc:          Complications: No notable events documented.
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
## 2021-12-18 NOTE — Interval H&P Note (Signed)
History and Physical Interval Note:  12/18/2021 7:40 AM  [name]  has presented today for surgery, with the diagnosis of sacral decubitus ulcer/abscess.  The various methods of treatment have been discussed with the patient and family. After consideration of risks, benefits and other options for treatment, the patient has consented to  Procedure(s): INCISION AND DRAINAGE OF SACRAL DECUBITUS ABSCESS (N/A) DEBRIDEMENT OF SACRAL DECUBITUS ULCER (N/A) as a surgical intervention.  The patient's history has been reviewed, patient examined, no change in status, stable for surgery.  I have reviewed the patient's chart and labs.  Questions were answered to the patient's satisfaction.     Axel Filler

## 2021-12-18 NOTE — Anesthesia Postprocedure Evaluation (Signed)
Anesthesia Post Note  Patient: [name]  Procedure(s) Performed: INCISION AND DRAINAGE OF POST-SACRAL ABSCESS (Buttocks) DEBRIDEMENT OF SACRAL DECUBITUS WOUND (Buttocks)     Patient location during evaluation: PACU Anesthesia Type: General Level of consciousness: awake and alert Pain management: pain level controlled Vital Signs Assessment: post-procedure vital signs reviewed and stable Respiratory status: spontaneous breathing, nonlabored ventilation and respiratory function stable Cardiovascular status: blood pressure returned to baseline and stable Postop Assessment: no apparent nausea or vomiting Anesthetic complications: no   No notable events documented.  Last Vitals:  Vitals:   12/18/21 0935 12/18/21 0950  BP: 95/70 101/67  Pulse: 63 63  Resp: 15 16  Temp:  (!) 36.4 C  SpO2: 93% 96%    Last Pain:  Vitals:   12/18/21 0950  TempSrc: Oral  PainSc:                  Yoshie Kosel,W. EDMOND

## 2021-12-18 NOTE — Anesthesia Preprocedure Evaluation (Addendum)
Anesthesia Evaluation  Patient identified by MRN, date of birth, ID band Patient awake    Reviewed: Allergy & Precautions, H&P , NPO status , Patient's Chart, lab work & pertinent test results  Airway Mallampati: I  TM Distance: >3 FB Neck ROM: Full    Dental no notable dental hx. (+) Teeth Intact, Dental Advisory Given   Pulmonary Current Smoker and Patient abstained from smoking.,    Pulmonary exam normal breath sounds clear to auscultation       Cardiovascular negative cardio ROS   Rhythm:Regular Rate:Normal     Neuro/Psych Seizures -, Well Controlled,  C5 Quadraplegia negative psych ROS   GI/Hepatic negative GI ROS, Neg liver ROS,   Endo/Other  negative endocrine ROS  Renal/GU negative Renal ROS  negative genitourinary   Musculoskeletal   Abdominal   Peds  Hematology  (+) Blood dyscrasia, anemia ,   Anesthesia Other Findings   Reproductive/Obstetrics negative OB ROS                            Anesthesia Physical Anesthesia Plan  ASA: 3  Anesthesia Plan: General   Post-op Pain Management: Tylenol PO (pre-op)*   Induction: Intravenous  PONV Risk Score and Plan: 2 and Ondansetron, Dexamethasone and Midazolam  Airway Management Planned: Oral ETT  Additional Equipment:   Intra-op Plan:   Post-operative Plan: Extubation in OR  Informed Consent: I have reviewed the patients History and Physical, chart, labs and discussed the procedure including the risks, benefits and alternatives for the proposed anesthesia with the patient or authorized representative who has indicated his/her understanding and acceptance.     Dental advisory given  Plan Discussed with: CRNA  Anesthesia Plan Comments:         Anesthesia Quick Evaluation

## 2021-12-19 ENCOUNTER — Encounter (HOSPITAL_COMMUNITY): Payer: Self-pay | Admitting: General Surgery

## 2021-12-19 DIAGNOSIS — G8250 Quadriplegia, unspecified: Secondary | ICD-10-CM | POA: Diagnosis not present

## 2021-12-19 DIAGNOSIS — L0231 Cutaneous abscess of buttock: Secondary | ICD-10-CM | POA: Diagnosis not present

## 2021-12-19 DIAGNOSIS — S069XAS Unspecified intracranial injury with loss of consciousness status unknown, sequela: Secondary | ICD-10-CM | POA: Diagnosis not present

## 2021-12-19 DIAGNOSIS — F0280 Dementia in other diseases classified elsewhere without behavioral disturbance: Secondary | ICD-10-CM | POA: Diagnosis not present

## 2021-12-19 LAB — BASIC METABOLIC PANEL
Anion gap: 8 (ref 5–15)
BUN: 13 mg/dL (ref 6–20)
CO2: 25 mmol/L (ref 22–32)
Calcium: 8.9 mg/dL (ref 8.9–10.3)
Chloride: 105 mmol/L (ref 98–111)
Creatinine, Ser: 0.52 mg/dL — ABNORMAL LOW (ref 0.61–1.24)
GFR, Estimated: >60 mL/min (ref >60)
Glucose, Bld: 87 mg/dL (ref 70–99)
Potassium: 4.8 mmol/L (ref 3.5–5.1)
Sodium: 138 mmol/L (ref 135–145)

## 2021-12-19 LAB — CBC
HCT: 40.9 % (ref 39.0–52.0)
Hemoglobin: 13.4 g/dL (ref 13.0–17.0)
MCH: 30.2 pg (ref 26.0–34.0)
MCHC: 32.8 g/dL (ref 30.0–36.0)
MCV: 92.3 fL (ref 80.0–100.0)
Platelets: 345 10*3/uL (ref 150–400)
RBC: 4.43 MIL/uL (ref 4.22–5.81)
RDW: 13.4 % (ref 11.5–15.5)
WBC: 12.2 10*3/uL — ABNORMAL HIGH (ref 4.0–10.5)
nRBC: 0 % (ref 0.0–0.2)

## 2021-12-19 MED ORDER — ENSURE ENLIVE PO LIQD: 237.0000 mL | Freq: Two times a day (BID) | ORAL | Status: DC

## 2021-12-19 MED ORDER — ACETAMINOPHEN 325 MG PO TABS: 650.0000 mg | ORAL_TABLET | Freq: Four times a day (QID) | ORAL | Status: AC | PRN

## 2021-12-19 MED ORDER — ADULT MULTIVITAMIN W/MINERALS CH: 1.0000 | ORAL_TABLET | Freq: Every day | ORAL | Status: DC

## 2021-12-19 MED ORDER — ZINC SULFATE 220 (50 ZN) MG PO CAPS
220.0000 mg | ORAL_CAPSULE | Freq: Every day | ORAL | 0 refills | Status: DC
Start: 2021-12-19 — End: 2022-02-14

## 2021-12-19 MED ORDER — JUVEN PO PACK: 1.0000 | PACK | Freq: Two times a day (BID) | ORAL | Status: DC
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
## 2021-12-19 NOTE — Progress Notes (Signed)
Initial Nutrition Assessment  DOCUMENTATION CODES:  Non-severe (moderate) malnutrition in context of chronic illness  INTERVENTION:  Continue current diet as ordered, encourage PO intake MVI with minerals daily 1 packet Juven BID, each packet provides 95 calories, 2.5 grams of protein (collagen), and 9.8 grams of carbohydrate (3 grams sugar); also contains 7 grams of L-arginine and L-glutamine, 300 mg vitamin C, 15 mg vitamin E, 1.2 mcg vitamin B-12, 9.5 mg zinc, 200 mg calcium, and 1.5 g  Calcium Beta-hydroxy-Beta-methylbutyrate to support wound healing Ensure Enlive po BID, each supplement provides 350 kcal and 20 grams of protein.  NUTRITION DIAGNOSIS:  Moderate Malnutrition (in the context of chronic illness) related to  (inadequate oral intake in the setting of increased needs for wound healing) as evidenced by mild fat depletion, moderate fat depletion.  GOAL:  Patient will meet greater than or equal to 90% of their needs  MONITOR:  PO intake, Supplement acceptance, Skin  REASON FOR ASSESSMENT:  Consult Wound healing  ASSESSMENT:  51 y.o. male with history of spinal cord injury with quadriplegia/quadriparesis, TBI, neurogenic bladder and bowel, s/p right BKA, left AKA presented to ED with new pressure injury of the ischium.   2/27 - I&D sacral decubitus wound  Reviewed wound care notes from [facility]. Appears that at some point over the last year pt became motivated to increase his PO intake and consume adequate amounts of protein. Was drinking protein shakes. Disorganized thoughts and conversations are frequently mentioned in progress notes and pt is a poor historian due to this.   Pt resting in bed at the time of assessment. Pt reports he is eating better during admission than at home. Noted breakfast tray at bedside with only a few bites consumed. Pt reports that his home health nurse told him that he shouldn't eat as much at home now that he is in a wheelchair and getting  older. Question the accuracy of this as pt is thin on exam with muscle and fat depletions noted.   Pt reports at home he eats mcdonalds for breakfast every day, a bag of chips for lunch, and either take out or a frozen meal for dinner. Diet severely lacking in nutrient dense food groups, particularly fruits and vegetables. Pt reports that he does not take a MVI at home.   Discussed with pt the importance of adequate nutrition for wound healing particularly on the importance of protein and micronutrients such as zinc and vitamin c. Pt endorses drinking an ensure (vanilla) at home daily.   Pt to be discharged today, but will order juven and ensure to support wound healing while admitted. Pt very concerned with condom catheter during visit, states it is going to leak in his wheelchair.    Nutritionally Relevant Medications: Scheduled Meds:  zinc sulfate  220 mg Oral Daily   PRN Meds: polyethylene glycol  Labs Reviewed: Creatinine .83   NUTRITION - FOCUSED PHYSICAL EXAM: Flowsheet Row Most Recent Value  Orbital Region Mild depletion  Upper Arm Region Mild depletion  Thoracic and Lumbar Region Mild depletion  Buccal Region Mild depletion  Temple Region Mild depletion  Clavicle Bone Region Mild depletion  Clavicle and Acromion Bone Region Mild depletion  Scapular Bone Region Mild depletion  Dorsal Hand Mild depletion  Patellar Region Moderate depletion  Anterior Thigh Region Moderate depletion  Posterior Calf Region Unable to assess  Edema (RD Assessment) None  Hair Reviewed  Eyes Reviewed  Mouth Reviewed  [poor oral hygiene]  Skin Reviewed  Nails  Reviewed   Diet Order:   Diet Order             Diet regular Room service appropriate? Yes; Fluid consistency: Thin  Diet effective now                   EDUCATION NEEDS:  Education needs have been addressed  Skin:  Skin Assessment: Skin Integrity Issues: Skin Integrity Issues:: Stage III, Unstageable, Stage II Stage II:  right buttocks; right posterior hip; right BKA site Stage III: left lateral hip Unstageable: left, posterior, proximal thigh  Last BM:  prior to admission  Height:  Ht Readings from Last 1 Encounters:  12/19/21 6\' 2"  (1.88 m)    Weight:  Wt Readings from Last 1 Encounters:  12/17/21 61.7 kg    Ideal Body Weight:  72.6 kg (adjusted by 15.9% for BKA and AKA)  BMI:  Body mass index is  20.8 kg/m. (Adjusted by 15.9% for amputations, using 61.7 kg)  Estimated Nutritional Needs:  Kcal:  2000-2200 kcal/d Protein:  100-115 g/d Fluid:  2.2-2.4 L/d   Ranell Patrick, RD, LDN Clinical Dietitian RD pager # available in [place]  After hours/weekend pager # available in [facility]

## 2021-12-19 NOTE — Progress Notes (Signed)
FPTS Brief Progress Note  S: Sleeping in bed, did not wake patient    O: BP (!) 124/97 (BP Location: Left Arm)    Pulse 74    Temp 98.5 F (36.9 C) (Oral)    Resp 18    Ht 5\' 5"  (1.651 m)    Wt 61.7 kg    SpO2 99%    BMI 22.63 kg/m   General: Sleeping Respiratory: No respiratory distress    A/P: S/p debridement of ulcer. Doing well. Continue with plan per day team. No current abx.   Erskine Emery, MD 12/19/2021, 2:29 AM PGY-1, [facility] Family Medicine Night Resident  Please page [phone] with questions.

## 2021-12-19 NOTE — Plan of Care (Signed)

## 2021-12-19 NOTE — Progress Notes (Signed)
[facility] Progress Note  1 Day Post-Op  Subjective: CC:  NAEO. Asking about wound care for surgical wound. Wants to go home today  Objective: Vital signs in last 24 hours: Temp:  [97.5 F (36.4 C)-98.5 F (36.9 C)] 97.8 F (36.6 C) (02/28 0735) Pulse Rate:  [62-75] 75 (02/28 0735) Resp:  [13-18] 18 (02/28 0735) BP: (78-124)/(49-97) 78/49 (02/28 0735) SpO2:  [93 %-100 %] 98 % (02/28 0735)    Intake/Output from previous day: 02/27 0701 - 02/28 0700 In: 1050 [I.V.:650; IV Piggyback:400] Out: 1410 [Urine:1400; Blood:10] Intake/Output this shift: No intake/output data recorded.  PE: Gen:  Alert, NAD, cooperative Pulm:  Normal effort Abd: Soft, non-tender  Skin: warm and dry, there is a left decubitus ulcer s/p operative debridement roughly 5x4x4 cm - base is >65% pink with a small amount fibrinous exudate and <5% necrosis at wound perimeter.  Psych: A&Ox3   Lab Results:  Recent Labs    12/18/21 0048 12/19/21 0103  WBC 10.3 12.2*  HGB 13.7 13.4  HCT 41.0 40.9  PLT 354 345   BMET Recent Labs    12/18/21 0048 12/19/21 0103  NA 136 138  K 4.2 4.8  CL 102 105  CO2 25 25  GLUCOSE 77 87  BUN 17 13  CREATININE 0.75 0.52*  CALCIUM 8.5* 8.9   PT/INR No results for input(s): LABPROT, INR in the last 72 hours. CMP     Component Value Date/Time   NA 138 12/19/2021 0103   NA 143 09/19/2016 1002   K 4.8 12/19/2021 0103   CL 105 12/19/2021 0103   CO2 25 12/19/2021 0103   GLUCOSE 87 12/19/2021 0103   BUN 13 12/19/2021 0103   BUN 17 09/19/2016 1002   CREATININE 0.52 (L) 12/19/2021 0103   CREATININE 0.81 05/26/2018 1038   CALCIUM 8.9 12/19/2021 0103   PROT 6.7 12/18/2021 0048   PROT 7.0 09/19/2016 1002   ALBUMIN 2.1 (L) 12/18/2021 0048   ALBUMIN 3.7 09/19/2016 1002   AST 13 (L) 12/18/2021 0048   ALT 7 12/18/2021 0048   ALKPHOS 57 12/18/2021 0048   BILITOT 0.4 12/18/2021 0048   BILITOT 0.6 09/19/2016 1002   GFRNONAA >60 12/19/2021 0103    GFRNONAA 109 11/21/2017 1422   GFRAA >60 02/10/2020 0645   GFRAA 126 11/21/2017 1422   Lipase  No results found for: LIPASE     Studies/Results: CT ABDOMEN PELVIS W CONTRAST  Result Date: 12/17/2021 CLINICAL DATA:  Intra-abdominal abscess. Severe decubitus ulcer of the sacrum. Concern for tracking infection. EXAM: CT ABDOMEN AND PELVIS WITH CONTRAST TECHNIQUE: Multidetector CT imaging of the abdomen and pelvis was performed using the standard protocol following bolus administration of intravenous contrast. RADIATION DOSE REDUCTION: This exam was performed according to the departmental dose-optimization program which includes automated exposure control, adjustment of the mA and/or kV according to patient size and/or use of iterative reconstruction technique. CONTRAST:  125mL OMNIPAQUE IOHEXOL 300 MG/ML  SOLN COMPARISON:  No priors available FINDINGS: Lower chest: No acute abnormality. Hepatobiliary: Liver is unremarkable.  Gallbladder is contracted. Pancreas: Unremarkable. No pancreatic ductal dilatation or surrounding inflammatory changes. Spleen: Normal in size without focal abnormality. Adrenals/Urinary Tract: Adrenal glands are normal. RIGHT kidney: There are 2 sub centimeters cyst in the RIGHT kidney. No solid masses. No hydronephrosis. RIGHT ureter is normal. LEFT kidney: LEFT kidney and ureter are unremarkable. No hydronephrosis. Bladder is unremarkable.  A prostatic urethral stent is present. Stomach/Bowel: Stomach is unremarkable. Small bowel loops are normal.  The appendix is well seen and normal in appearance. Loops of colon are unremarkable. Moderate stool burden. Vascular/Lymphatic: No significant vascular findings are present. No enlarged abdominal or pelvic lymph nodes. Reproductive: Prostate and seminal vesicles have a normal appearance. Prostatic urethral stent in place. Other: An abscess superficial to the LEFT ischial spine measures 5.0 x 3.8 centimeters. This abscess is contiguous with  a ulceration in the skin. The abscess tracks to the ischial spine with there is no cortical erosion. There is no extension of the abscess into the peritoneal cavity. Surrounding this collection, there is subcutaneous edema. There is edema superficial to the RIGHT iliac spine, not associated with fluid collection. Musculoskeletal: Posttraumatic deformity of the LEFT hip, significant heterotopic bone. Degenerative changes in the LOWER lumbar spine. IMPRESSION: 1. 5.0 x 3.8 centimeter abscess superficial to the LEFT ischial spine, associated with ulceration in the skin. 2. The abscess tracks to the ischial spine without evidence for osteomyelitis. 3. No extension of abscess into the peritoneal cavity. 4. Posttraumatic deformity of the LEFT hip. Electronically Signed   By: Nolon Nations M.D.   On: 12/17/2021 15:12    Anti-infectives: Anti-infectives (From admission, onward)    Start     Dose/Rate Route Frequency Ordered Stop   12/18/21 0300  vancomycin (VANCOCIN) IVPB 1000 mg/200 mL premix  Status:  Discontinued        1,000 mg 200 mL/hr over 60 Minutes Intravenous Every 12 hours 12/17/21 1520 12/18/21 1143   12/17/21 1515  vancomycin (VANCOREADY) IVPB 1250 mg/250 mL        1,250 mg 166.7 mL/hr over 90 Minutes Intravenous  Once 12/17/21 1506 12/17/21 1752   12/17/21 1515  ceFEPIme (MAXIPIME) 2 g in sodium chloride 0.9 % 100 mL IVPB  Status:  Discontinued        2 g 200 mL/hr over 30 Minutes Intravenous Every 8 hours 12/17/21 1508 12/18/21 1143      Assessment/Plan Stage IV decubitus ulcer  POD#1 s/p INCISION AND DRAINAGE OF POST-SACRAL ABSCESS (N/A) DEBRIDEMENT OF SACRAL DECUBITUS WOUND (N/A) 2/27 Dr. Rosendo Gros - PT hydro ordered for additional debridement of fibrinous tissue, no futher surgical debridement recommended at this time. -  BID moist-to-dry dressing changes and as needed for contamination with stool. - patient may shower - ok for discharge from a [facility] perspective when cleared by  medical team. Will need to demonstrate ability to pack wound independently prior to discharge. Will have RN perform education and observe patient perform his own wound care.    LOS: 2 days   I reviewed nursing notes, last 24 h vitals and pain scores, last 48 h intake and output, last 24 h labs and trends, and last 24 h imaging results.  This care required low level of medical decision making.   Obie Dredge, PA-C [facility] Please see Amion for pager number during day hours 7:00am-4:30pm

## 2021-12-19 NOTE — Progress Notes (Signed)
Pt has been talking rude to night staff and demanding this shift.

## 2022-01-24 ENCOUNTER — Encounter
Payer: Medicare Other | Attending: Physical Medicine and Rehabilitation | Admitting: Physical Medicine and Rehabilitation

## 2022-02-04 ENCOUNTER — Inpatient Hospital Stay (HOSPITAL_COMMUNITY)
Admission: EM | Admit: 2022-02-04 | Discharge: 2022-02-14 | DRG: 871 | Disposition: A | Discharge status: Home Health | Payer: Medicare Other | Attending: Student | Admitting: Student

## 2022-02-04 ENCOUNTER — Emergency Department (HOSPITAL_COMMUNITY): Payer: Medicare Other

## 2022-02-04 ENCOUNTER — Other Ambulatory Visit: Payer: Self-pay

## 2022-02-04 DIAGNOSIS — I10 Essential (primary) hypertension: Secondary | ICD-10-CM | POA: Diagnosis present

## 2022-02-04 DIAGNOSIS — S14109A Unspecified injury at unspecified level of cervical spinal cord, initial encounter: Secondary | ICD-10-CM | POA: Diagnosis present

## 2022-02-04 DIAGNOSIS — Z89511 Acquired absence of right leg below knee: Secondary | ICD-10-CM

## 2022-02-04 DIAGNOSIS — R6521 Severe sepsis with septic shock: Secondary | ICD-10-CM | POA: Diagnosis present

## 2022-02-04 DIAGNOSIS — Z981 Arthrodesis status: Secondary | ICD-10-CM | POA: Diagnosis not present

## 2022-02-04 DIAGNOSIS — R636 Underweight: Secondary | ICD-10-CM | POA: Diagnosis present

## 2022-02-04 DIAGNOSIS — E43 Unspecified severe protein-calorie malnutrition: Secondary | ICD-10-CM | POA: Diagnosis not present

## 2022-02-04 DIAGNOSIS — E878 Other disorders of electrolyte and fluid balance, not elsewhere classified: Secondary | ICD-10-CM | POA: Diagnosis not present

## 2022-02-04 DIAGNOSIS — E871 Hypo-osmolality and hyponatremia: Secondary | ICD-10-CM | POA: Diagnosis not present

## 2022-02-04 DIAGNOSIS — G8220 Paraplegia, unspecified: Secondary | ICD-10-CM | POA: Diagnosis present

## 2022-02-04 DIAGNOSIS — I96 Gangrene, not elsewhere classified: Secondary | ICD-10-CM | POA: Diagnosis present

## 2022-02-04 DIAGNOSIS — L89216 Pressure-induced deep tissue damage of right hip: Secondary | ICD-10-CM | POA: Diagnosis present

## 2022-02-04 DIAGNOSIS — Z681 Body mass index (BMI) 19 or less, adult: Secondary | ICD-10-CM | POA: Diagnosis not present

## 2022-02-04 DIAGNOSIS — F17210 Nicotine dependence, cigarettes, uncomplicated: Secondary | ICD-10-CM | POA: Diagnosis present

## 2022-02-04 DIAGNOSIS — N319 Neuromuscular dysfunction of bladder, unspecified: Secondary | ICD-10-CM | POA: Diagnosis present

## 2022-02-04 DIAGNOSIS — L89220 Pressure ulcer of left hip, unstageable: Secondary | ICD-10-CM

## 2022-02-04 DIAGNOSIS — A419 Sepsis, unspecified organism: Secondary | ICD-10-CM | POA: Diagnosis present

## 2022-02-04 DIAGNOSIS — Z8249 Family history of ischemic heart disease and other diseases of the circulatory system: Secondary | ICD-10-CM

## 2022-02-04 DIAGNOSIS — I160 Hypertensive urgency: Secondary | ICD-10-CM | POA: Diagnosis present

## 2022-02-04 DIAGNOSIS — M4628 Osteomyelitis of vertebra, sacral and sacrococcygeal region: Secondary | ICD-10-CM

## 2022-02-04 DIAGNOSIS — L89224 Pressure ulcer of left hip, stage 4: Secondary | ICD-10-CM | POA: Diagnosis present

## 2022-02-04 DIAGNOSIS — J9601 Acute respiratory failure with hypoxia: Secondary | ICD-10-CM | POA: Diagnosis not present

## 2022-02-04 DIAGNOSIS — G8250 Quadriplegia, unspecified: Secondary | ICD-10-CM | POA: Diagnosis not present

## 2022-02-04 DIAGNOSIS — S14109S Unspecified injury at unspecified level of cervical spinal cord, sequela: Secondary | ICD-10-CM | POA: Diagnosis not present

## 2022-02-04 DIAGNOSIS — L89154 Pressure ulcer of sacral region, stage 4: Secondary | ICD-10-CM | POA: Diagnosis present

## 2022-02-04 DIAGNOSIS — G40309 Generalized idiopathic epilepsy and epileptic syndromes, not intractable, without status epilepticus: Secondary | ICD-10-CM | POA: Diagnosis not present

## 2022-02-04 DIAGNOSIS — G40509 Epileptic seizures related to external causes, not intractable, without status epilepticus: Secondary | ICD-10-CM | POA: Diagnosis present

## 2022-02-04 DIAGNOSIS — D638 Anemia in other chronic diseases classified elsewhere: Secondary | ICD-10-CM | POA: Diagnosis not present

## 2022-02-04 DIAGNOSIS — Z89612 Acquired absence of left leg above knee: Secondary | ICD-10-CM | POA: Diagnosis not present

## 2022-02-04 DIAGNOSIS — S14155S Other incomplete lesion at C5 level of cervical spinal cord, sequela: Secondary | ICD-10-CM

## 2022-02-04 DIAGNOSIS — Z9049 Acquired absence of other specified parts of digestive tract: Secondary | ICD-10-CM | POA: Diagnosis not present

## 2022-02-04 DIAGNOSIS — L89894 Pressure ulcer of other site, stage 4: Secondary | ICD-10-CM | POA: Diagnosis present

## 2022-02-04 DIAGNOSIS — G9341 Metabolic encephalopathy: Secondary | ICD-10-CM | POA: Diagnosis present

## 2022-02-04 DIAGNOSIS — S069XAS Unspecified intracranial injury with loss of consciousness status unknown, sequela: Secondary | ICD-10-CM | POA: Diagnosis not present

## 2022-02-04 DIAGNOSIS — M86159 Other acute osteomyelitis, unspecified femur: Secondary | ICD-10-CM | POA: Diagnosis not present

## 2022-02-04 DIAGNOSIS — Z87898 Personal history of other specified conditions: Secondary | ICD-10-CM | POA: Diagnosis not present

## 2022-02-04 DIAGNOSIS — G9340 Encephalopathy, unspecified: Secondary | ICD-10-CM | POA: Diagnosis not present

## 2022-02-04 DIAGNOSIS — D6959 Other secondary thrombocytopenia: Secondary | ICD-10-CM | POA: Diagnosis not present

## 2022-02-04 DIAGNOSIS — Z993 Dependence on wheelchair: Secondary | ICD-10-CM | POA: Diagnosis not present

## 2022-02-04 DIAGNOSIS — M869 Osteomyelitis, unspecified: Secondary | ICD-10-CM

## 2022-02-04 DIAGNOSIS — G903 Multi-system degeneration of the autonomic nervous system: Secondary | ICD-10-CM | POA: Diagnosis not present

## 2022-02-04 DIAGNOSIS — I959 Hypotension, unspecified: Secondary | ICD-10-CM

## 2022-02-04 DIAGNOSIS — D649 Anemia, unspecified: Secondary | ICD-10-CM

## 2022-02-04 DIAGNOSIS — Z720 Tobacco use: Secondary | ICD-10-CM | POA: Diagnosis present

## 2022-02-04 DIAGNOSIS — Z89611 Acquired absence of right leg above knee: Secondary | ICD-10-CM

## 2022-02-04 DIAGNOSIS — L89312 Pressure ulcer of right buttock, stage 2: Secondary | ICD-10-CM | POA: Diagnosis present

## 2022-02-04 DIAGNOSIS — F0280 Dementia in other diseases classified elsewhere without behavioral disturbance: Secondary | ICD-10-CM | POA: Diagnosis not present

## 2022-02-04 DIAGNOSIS — R579 Shock, unspecified: Secondary | ICD-10-CM | POA: Diagnosis not present

## 2022-02-04 DIAGNOSIS — R7881 Bacteremia: Secondary | ICD-10-CM | POA: Diagnosis not present

## 2022-02-04 DIAGNOSIS — E8809 Other disorders of plasma-protein metabolism, not elsewhere classified: Secondary | ICD-10-CM | POA: Diagnosis not present

## 2022-02-04 DIAGNOSIS — R64 Cachexia: Secondary | ICD-10-CM | POA: Diagnosis present

## 2022-02-04 DIAGNOSIS — R4182 Altered mental status, unspecified: Secondary | ICD-10-CM | POA: Diagnosis not present

## 2022-02-04 DIAGNOSIS — G40919 Epilepsy, unspecified, intractable, without status epilepticus: Secondary | ICD-10-CM | POA: Diagnosis not present

## 2022-02-04 DIAGNOSIS — R339 Retention of urine, unspecified: Secondary | ICD-10-CM | POA: Diagnosis not present

## 2022-02-04 DIAGNOSIS — L89210 Pressure ulcer of right hip, unstageable: Secondary | ICD-10-CM | POA: Diagnosis not present

## 2022-02-04 HISTORY — DX: Osteomyelitis of vertebra, sacral and sacrococcygeal region: M46.28

## 2022-02-04 LAB — CREATININE, SERUM
Creatinine, Ser: 0.78 mg/dL (ref 0.61–1.24)
GFR, Estimated: >60 mL/min (ref >60)

## 2022-02-04 LAB — CBC
HCT: 24 % — ABNORMAL LOW (ref 39.0–52.0)
Hemoglobin: 8.4 g/dL — ABNORMAL LOW (ref 13.0–17.0)
MCH: 30.7 pg (ref 26.0–34.0)
MCHC: 35 g/dL (ref 30.0–36.0)
MCV: 87.6 fL (ref 80.0–100.0)
Platelets: 228 10*3/uL (ref 150–400)
RBC: 2.74 MIL/uL — ABNORMAL LOW (ref 4.22–5.81)
RDW: 15.9 % — ABNORMAL HIGH (ref 11.5–15.5)
WBC: 20.5 10*3/uL — ABNORMAL HIGH (ref 4.0–10.5)
nRBC: 0.1 % (ref 0.0–0.2)

## 2022-02-04 LAB — CBC WITH DIFFERENTIAL/PLATELET
Abs Immature Granulocytes: 0.36 10*3/uL — ABNORMAL HIGH (ref 0.00–0.07)
Basophils Absolute: 0.1 10*3/uL (ref 0.0–0.1)
Basophils Relative: 0 %
Eosinophils Absolute: 0 10*3/uL (ref 0.0–0.5)
Eosinophils Relative: 0 %
HCT: 23.9 % — ABNORMAL LOW (ref 39.0–52.0)
Hemoglobin: 8.6 g/dL — ABNORMAL LOW (ref 13.0–17.0)
Immature Granulocytes: 2 %
Lymphocytes Relative: 5 %
Lymphs Abs: 1.1 10*3/uL (ref 0.7–4.0)
MCH: 30.7 pg (ref 26.0–34.0)
MCHC: 36 g/dL (ref 30.0–36.0)
MCV: 85.4 fL (ref 80.0–100.0)
Monocytes Absolute: 2.7 10*3/uL — ABNORMAL HIGH (ref 0.1–1.0)
Monocytes Relative: 12 %
Neutro Abs: 18.2 10*3/uL — ABNORMAL HIGH (ref 1.7–7.7)
Neutrophils Relative %: 81 %
Platelets: 262 10*3/uL (ref 150–400)
RBC: 2.8 MIL/uL — ABNORMAL LOW (ref 4.22–5.81)
RDW: 15.4 % (ref 11.5–15.5)
WBC: 22.4 10*3/uL — ABNORMAL HIGH (ref 4.0–10.5)
nRBC: 0.2 % (ref 0.0–0.2)

## 2022-02-04 LAB — COMPREHENSIVE METABOLIC PANEL
ALT: 15 U/L (ref 0–44)
AST: 25 U/L (ref 15–41)
Albumin: <1.5 g/dL — ABNORMAL LOW (ref 3.5–5.0)
Alkaline Phosphatase: 111 U/L (ref 38–126)
Anion gap: 8 (ref 5–15)
BUN: 50 mg/dL — ABNORMAL HIGH (ref 6–20)
CO2: 21 mmol/L — ABNORMAL LOW (ref 22–32)
Calcium: 7.8 mg/dL — ABNORMAL LOW (ref 8.9–10.3)
Chloride: 101 mmol/L (ref 98–111)
Creatinine, Ser: 0.86 mg/dL (ref 0.61–1.24)
GFR, Estimated: >60 mL/min (ref >60)
Glucose, Bld: 81 mg/dL (ref 70–99)
Potassium: 4.3 mmol/L (ref 3.5–5.1)
Sodium: 130 mmol/L — ABNORMAL LOW (ref 135–145)
Total Bilirubin: 0.6 mg/dL (ref 0.3–1.2)
Total Protein: 7.1 g/dL (ref 6.5–8.1)

## 2022-02-04 LAB — URINALYSIS, ROUTINE W REFLEX MICROSCOPIC
Bilirubin Urine: NEGATIVE
Glucose, UA: NEGATIVE mg/dL
Hgb urine dipstick: NEGATIVE
Ketones, ur: 5 mg/dL — AB
Nitrite: NEGATIVE
Protein, ur: NEGATIVE mg/dL
Specific Gravity, Urine: 1.016 (ref 1.005–1.030)
pH: 5 (ref 5.0–8.0)

## 2022-02-04 LAB — LACTIC ACID, PLASMA
Lactic Acid, Venous: 1.6 mmol/L (ref 0.5–1.9)
Lactic Acid, Venous: 1.4 mmol/L (ref 0.5–1.9)

## 2022-02-04 LAB — AMMONIA: Ammonia: 33 umol/L (ref 9–35)

## 2022-02-04 LAB — PROTIME-INR
INR: 1.4 — ABNORMAL HIGH (ref 0.8–1.2)
Prothrombin Time: 17.2 seconds — ABNORMAL HIGH (ref 11.4–15.2)

## 2022-02-04 LAB — MAGNESIUM: Magnesium: 2.1 mg/dL (ref 1.7–2.4)

## 2022-02-04 LAB — VALPROIC ACID LEVEL: Valproic Acid Lvl: 52 ug/mL (ref 50.0–100.0)

## 2022-02-04 LAB — CBG MONITORING, ED: Glucose-Capillary: 79 mg/dL (ref 70–99)

## 2022-02-04 LAB — APTT: aPTT: 29 seconds (ref 24–36)

## 2022-02-04 MED ORDER — VANCOMYCIN HCL IN DEXTROSE 1-5 GM/200ML-% IV SOLN: 1000.0000 mg | Freq: Once | INTRAVENOUS | Status: DC

## 2022-02-04 MED ORDER — ACETAMINOPHEN 325 MG PO TABS: 650.0000 mg | ORAL_TABLET | Freq: Four times a day (QID) | ORAL | Status: DC | PRN

## 2022-02-04 MED ORDER — ONDANSETRON HCL 4 MG PO TABS: 4.0000 mg | ORAL_TABLET | Freq: Four times a day (QID) | ORAL | Status: DC | PRN

## 2022-02-04 MED ORDER — VANCOMYCIN HCL 1250 MG/250ML IV SOLN
1250.0000 mg | INTRAVENOUS | Status: DC
  Administered 2022-02-04: 1250 mg via INTRAVENOUS
  Filled 2022-02-04: qty 250

## 2022-02-04 MED ORDER — DEXTROSE IN LACTATED RINGERS 5 % IV SOLN: INTRAVENOUS | Status: DC

## 2022-02-04 MED ORDER — METRONIDAZOLE 500 MG/100ML IV SOLN
500.0000 mg | Freq: Once | INTRAVENOUS | Status: AC
  Administered 2022-02-04: 500 mg via INTRAVENOUS
  Filled 2022-02-04: qty 100

## 2022-02-04 MED ORDER — ENOXAPARIN SODIUM 40 MG/0.4ML IJ SOSY
40.0000 mg | PREFILLED_SYRINGE | INTRAMUSCULAR | Status: DC
  Administered 2022-02-07 – 2022-02-08 (×2): 40 mg via SUBCUTANEOUS
  Filled 2022-02-04 (×3): qty 0.4

## 2022-02-04 MED ORDER — METRONIDAZOLE 500 MG/100ML IV SOLN
500.0000 mg | Freq: Two times a day (BID) | INTRAVENOUS | Status: DC
  Administered 2022-02-05: 500 mg via INTRAVENOUS
  Filled 2022-02-04: qty 100

## 2022-02-04 MED ORDER — SODIUM CHLORIDE 0.9 % IV SOLN
2.0000 g | INTRAVENOUS | Status: DC
  Administered 2022-02-05: 2 g via INTRAVENOUS
  Filled 2022-02-04: qty 20

## 2022-02-04 MED ORDER — SODIUM CHLORIDE 0.9 % IV SOLN
2.0000 g | Freq: Once | INTRAVENOUS | Status: AC
  Administered 2022-02-04: 2 g via INTRAVENOUS
  Filled 2022-02-04: qty 12.5

## 2022-02-04 MED ORDER — ONDANSETRON HCL 4 MG/2ML IJ SOLN: 4.0000 mg | Freq: Four times a day (QID) | INTRAMUSCULAR | Status: DC | PRN

## 2022-02-04 MED ORDER — ACETAMINOPHEN 650 MG RE SUPP: 650.0000 mg | Freq: Four times a day (QID) | RECTAL | Status: DC | PRN

## 2022-02-04 MED ORDER — SODIUM CHLORIDE 0.9 % IV BOLUS
30.0000 mL/kg | Freq: Once | INTRAVENOUS | Status: AC
  Administered 2022-02-04: 1800 mL via INTRAVENOUS

## 2022-02-04 MED ORDER — LACTATED RINGERS IV SOLN: INTRAVENOUS | Status: DC

## 2022-02-04 MED ORDER — LACTATED RINGERS IV BOLUS (SEPSIS)
1000.0000 mL | Freq: Once | INTRAVENOUS | Status: AC
  Administered 2022-02-04: 1000 mL via INTRAVENOUS

## 2022-02-04 NOTE — Progress Notes (Signed)
A consult was received from an ED physician for vancomycin and cefepime per pharmacy dosing.  The patient's profile has been reviewed for ht/wt/allergies/indication/available labs.   ?A one time order has been placed for vancomycin 1g and cefepime 2g.  Further antibiotics/pharmacy consults should be ordered by admitting physician if indicated.       ?                ?Thank you, ?Peggyann Juba, PharmD, BCPS ?02/04/2022  5:53 PM ? ?

## 2022-02-04 NOTE — Progress Notes (Signed)
Pharmacy Antibiotic Note ? ?[name] is a 51 y.o. male admitted on 02/04/2022 with sepsis secondary to deep decubitus ulcer extending to bone.  PMH notable for quadriplegia, spinal cord injury, L BKA and R AKA.  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  Pharmacy has been consulted for vancomycin dosing. ? ?Using last recorded weight of 61.8 kg from Feb 2023 ? ?Plan: ?Received vancomycin 1000mg  IV x1 in ED ?Begin vancomycin 1250mg  IV q24 hours (eAUC 405, Scr rounded to 1, Vd 0.72, TBW of 61.8 kg) ?Continue ceftriaxone and metronidazole per MD ?F/u ID recommendations, surgical recommendations ? ?  ? ?Temp (24hrs), Avg:98.2 ?F (36.8 ?C), Min:98 ?F (36.7 ?C), Max:98.4 ?F (36.9 ?C) ? ?Recent Labs  ?Lab 02/04/22 ?1540  ?WBC 22.4*  ?CREATININE 0.86  ?LATICACIDVEN 1.6  ?  ?CrCl cannot be calculated (Unknown ideal weight.).   ? ?Allergies  ?Allergen Reactions  ? Baclofen Other (See Comments)  ?  Can cause seizures, since has epilepsy- lowers seizure threshold  ? ? ?Antimicrobials this admission: ?Vancomycin 4/16 >> ?Ceftriaxone 4/16 >>  ?Flagyl >> 4/16 ?Cefepime x1 in ED ? ?Dose adjustments this admission: ? ? ?Microbiology results: ?4/16 BCx:  ? ?Thank you for allowing pharmacy to be a part of this patient?s care. ? ?5/16, PharmD ?02/04/2022 7:27 PM ? ?

## 2022-02-04 NOTE — ED Provider Notes (Signed)
?[name] ?Provider Note ? ? ?CSN: [id number] ?Arrival date & time: 02/04/22  1505 ? ?  ? ?History ? ?Chief Complaint  ?Patient presents with  ?? Skin Ulcer  ?  Large pressure wound to buttocks, necrotic and pressure ulcer starting to right hip per family. Goes to wound clinic monthly but has not been seen in about one month and it has worsened. Patient has L AKA and R BKA. Confusion. Hx of seizures. History of pressure ulcers and sepsis.   ? ? ?[name] is a 51 y.o. male with a past medical history of epilepsy, quadriplegia and quadriparesis, history of normal neurogenic bowel and bladder, TBI, stage IV pressure ulcer of the ischium on the left side as well as a stage IV pressure ulcer of the left hip.  Patient is currently confused and unable to give an accurate history.  History is gathered by telephone conversation with the patient's mother and healthcare power of attorney, [name].  Patient's mother states that he has not been acting himself lately.  He is usually very regimented with his self-care and daily habits and is usually out of his room by 7:30 AM.  He does bowel stimulation and catheterization of his bladder surgery scheduled times.  Today the patient was still in his bed around 10:30 AM which is extremely unusual.  His mother states that he kept repeating bowel stimulation which is very abnormal and was having repetitive behaviors.  She states that it was a 1:00 and he went to catheterize himself with.  He never does until about 2:30 PM.  She states that he seemed extremely confused.  She called 911 and had him transported and on the way the patient had seizure prior to arrival.  She states that he has known ulcers on his bottom but changes his dressings by himself and tries to do all his own home care.  Patient unable to provide any other history at this time due to confusion ? ?HPI ? ?  ? ?Home Medications ?Prior to Admission medications    ?Medication Sig Start Date End Date Taking? Authorizing Provider  ?acetaminophen (TYLENOL) 325 MG tablet Take 2 tablets (650 mg total) by mouth every 6 (six) hours as needed for mild pain (or Fever >/= 101). 12/19/21   Eppie Gibson, MD  ?ciprofloxacin (CIPRO) 500 MG tablet  05/26/21   [provider]  ?divalproex (DEPAKOTE ER) 500 MG 24 hr tablet TAKE 2 TABLETS BY MOUTH EVERY MORNING, 1 TABLET IN AFTERNOON, AND 2 TABLETS AT NIGHT. ?Patient taking differently: Take 500-1,000 mg by mouth See admin instructions. 1000mg  in the morning, 500mg  in the afternoon, and 1000mg  at bedtime 10/31/21   Cameron Sprang, MD  ?mineral oil-hydrophilic petrolatum (AQUAPHOR) ointment Apply 1 application topically daily as needed (with pressure sore pad changes).    [provider]  ?zinc sulfate 220 (50 Zn) MG capsule Take 1 capsule (220 mg total) by mouth daily. 12/19/21   Eppie Gibson, MD  ?   ? ?Allergies    ?Baclofen   ? ?Review of Systems   ?Review of Systems ? ?Physical Exam ?Updated Vital Signs ?BP 120/77   Pulse 97   Temp 98.4 ?F (36.9 ?C) (Rectal)   Resp (!) 22   SpO2 100%  ?Physical Exam ?Vitals and nursing note reviewed.  ?Constitutional:   ?   General: He is not in acute distress. ?   Appearance: He is well-developed. He is not diaphoretic.  ?  HENT:  ?   Head: Normocephalic and atraumatic.  ?Eyes:  ?   General: No scleral icterus. ?   Conjunctiva/sclera: Conjunctivae normal.  ?Cardiovascular:  ?   Rate and Rhythm: Normal rate and regular rhythm.  ?   Heart sounds: Normal heart sounds.  ?Pulmonary:  ?   Effort: Pulmonary effort is normal. No respiratory distress.  ?   Breath sounds: Normal breath sounds.  ?Abdominal:  ?   Palpations: Abdomen is soft.  ?   Tenderness: There is no abdominal tenderness.  ?Musculoskeletal:  ?   Cervical back: Normal range of motion and neck supple.  ?Skin: ?   General: Skin is warm and dry.  ?   Comments: Stage IV pressure ulcer of the left ischium with visible bone,  foul-smelling.  Packing appears contaminated.  There is also a stage IV ulcer of the left hip.  ?Neurological:  ?   Mental Status: He is confused.  ?   GCS: GCS eye subscore is 4. GCS verbal subscore is 5. GCS motor subscore is 6.  ?Psychiatric:     ?   Behavior: Behavior normal.  ? ? ? ? ? ? ? ?ED Results / Procedures / Treatments   ?Labs ?(all labs ordered are listed, but only abnormal results are displayed) ?Labs Reviewed  ?CULTURE, BLOOD (ROUTINE X 2)  ?CULTURE, BLOOD (ROUTINE X 2)  ?COMPREHENSIVE METABOLIC PANEL  ?LACTIC ACID, PLASMA  ?LACTIC ACID, PLASMA  ?CBC WITH DIFFERENTIAL/PLATELET  ?PROTIME-INR  ?URINALYSIS, ROUTINE W REFLEX MICROSCOPIC  ? ? ?EKG ?None ? ?Radiology ?No results found. ? ?Procedures ?Marland KitchenCritical Care ?Performed by: Margarita Mail, PA-C ?Authorized by: Margarita Mail, PA-C  ? ?Critical care provider statement:  ?  Critical care time (minutes):  60 ?  Critical care time was exclusive of:  Separately billable procedures and treating other patients ?  Critical care was necessary to treat or prevent imminent or life-threatening deterioration of the following conditions:  Sepsis ?  Critical care was time spent personally by me on the following activities:  Development of treatment plan with patient or surrogate, discussions with consultants, evaluation of patient's response to treatment, examination of patient, ordering and review of laboratory studies, ordering and review of radiographic studies, ordering and performing treatments and interventions, pulse oximetry, re-evaluation of patient's condition and review of old charts  ? ? ?Medications Ordered in ED ?Medications - No data to display ? ?ED Course/ Medical Decision Making/ A&P ?Clinical Course as of 02/04/22 1825  ?Sun Feb 04, 2022  ?1739 Sodium(!): 130 [AH]  ?1742 ED EKG [AH]  ?1742 CBC with Differential(!) [AH]  ?1742 WBC(!): 22.4 [AH]  ?1742 Hemoglobin(!): 8.6 ?Patient's HGB 8.6 down from 13.4 [AH]  ?1802 POC occult blood, ED ?Present  for the test run in the mini lab.  Point-of-care occult stool is negative [AH]  ?  ?Clinical Course User Index ?[AH] Margarita Mail, PA-C  ? ?                        ?Medical Decision Making ?Problems Addressed: ?Anemia, unspecified type: complicated acute illness or injury ?Osteomyelitis of pelvis [facility]): complicated acute illness or injury with systemic symptoms ?Sepsis, due to unspecified organism, unspecified whether acute organ dysfunction present [facility]): complicated acute illness or injury with systemic symptoms that poses a threat to life or bodily functions ? ?Amount and/or Complexity of Data Reviewed ?Labs: ordered. Decision-making details documented in ED Course. ?Radiology: ordered. ?ECG/medicine tests:  Decision-making details documented in  ED Course. ?Discussion of management or test interpretation with external provider(s): Patient here with confusion, hypotensionm and and pelvic decubitus ulcers. Patient has exposed bone.  ?I suspect AMS is due to sepsis. He is responding well to fluid resuscitation. Patient also has an abrupt drop in his hgb. No evidence of GI bleed.  Comorbidities affecting care include. Paraplegia, TBI. Patient admitted for sepsis due to osteomyelitis ? ?Risk ?Prescription drug management. ?Decision regarding hospitalization. ? ?Critical Care ?Total time providing critical care: 60 minutes ? ? ? ? ?  ? ? ? ? ?Final Clinical Impression(s) / ED Diagnoses ?Final diagnoses:  ?None  ? ? ?Rx / DC Orders ?ED Discharge Orders   ? ? None  ? ?  ? ? ?  ?Margarita Mail, PA-C ?02/04/22 2008 ? ?  ?Regan Lemming, MD ?02/04/22 2302 ? ?

## 2022-02-04 NOTE — ED Notes (Signed)
Patient demanded an 51 french cath to self cath himself. He refused for nurse to help. Nurse left patients room due to patient telling me to get out. Side rails up and patient attempting to cath himself. JRPRN  ?

## 2022-02-04 NOTE — ED Triage Notes (Signed)
Large pressure wound to buttocks, necrotic and pressure ulcer starting to right hip per family. Goes to wound clinic monthly but has not been seen in about one month and it has worsened. Patient has L AKA and R BKA. Confusion. Hx of seizures. History of pressure ulcers and sepsis ?
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
## 2022-02-04 NOTE — Progress Notes (Signed)
Elink following code sepsis °

## 2022-02-04 NOTE — ED Notes (Signed)
Patient refusing covid swab

## 2022-02-05 ENCOUNTER — Encounter (HOSPITAL_COMMUNITY): Payer: Self-pay | Admitting: Internal Medicine

## 2022-02-05 DIAGNOSIS — G8250 Quadriplegia, unspecified: Secondary | ICD-10-CM

## 2022-02-05 DIAGNOSIS — A419 Sepsis, unspecified organism: Secondary | ICD-10-CM

## 2022-02-05 DIAGNOSIS — L89154 Pressure ulcer of sacral region, stage 4: Secondary | ICD-10-CM | POA: Diagnosis not present

## 2022-02-05 DIAGNOSIS — M4628 Osteomyelitis of vertebra, sacral and sacrococcygeal region: Secondary | ICD-10-CM | POA: Diagnosis not present

## 2022-02-05 LAB — BLOOD CULTURE ID PANEL (REFLEXED) - BCID2

## 2022-02-05 LAB — CBC
HCT: 25.1 % — ABNORMAL LOW (ref 39.0–52.0)
Hemoglobin: 8.6 g/dL — ABNORMAL LOW (ref 13.0–17.0)
MCH: 30 pg (ref 26.0–34.0)
MCHC: 34.3 g/dL (ref 30.0–36.0)
MCV: 87.5 fL (ref 80.0–100.0)
Platelets: 223 10*3/uL (ref 150–400)
RBC: 2.87 MIL/uL — ABNORMAL LOW (ref 4.22–5.81)
RDW: 16.1 % — ABNORMAL HIGH (ref 11.5–15.5)
WBC: 18.7 10*3/uL — ABNORMAL HIGH (ref 4.0–10.5)
nRBC: 0.2 % (ref 0.0–0.2)

## 2022-02-05 LAB — COMPREHENSIVE METABOLIC PANEL
ALT: 12 U/L (ref 0–44)
AST: 21 U/L (ref 15–41)
Albumin: <1.5 g/dL — ABNORMAL LOW (ref 3.5–5.0)
Alkaline Phosphatase: 98 U/L (ref 38–126)
Anion gap: 7 (ref 5–15)
BUN: 33 mg/dL — ABNORMAL HIGH (ref 6–20)
CO2: 20 mmol/L — ABNORMAL LOW (ref 22–32)
Calcium: 7.9 mg/dL — ABNORMAL LOW (ref 8.9–10.3)
Chloride: 109 mmol/L (ref 98–111)
Creatinine, Ser: 0.7 mg/dL (ref 0.61–1.24)
GFR, Estimated: >60 mL/min (ref >60)
Glucose, Bld: 105 mg/dL — ABNORMAL HIGH (ref 70–99)
Potassium: 3.7 mmol/L (ref 3.5–5.1)
Sodium: 136 mmol/L (ref 135–145)
Total Bilirubin: 0.4 mg/dL (ref 0.3–1.2)
Total Protein: 6.3 g/dL — ABNORMAL LOW (ref 6.5–8.1)

## 2022-02-05 LAB — PROTIME-INR
INR: 1.4 — ABNORMAL HIGH (ref 0.8–1.2)
Prothrombin Time: 17.3 seconds — ABNORMAL HIGH (ref 11.4–15.2)

## 2022-02-05 LAB — CORTISOL-AM, BLOOD: Cortisol - AM: 24.1 ug/dL — ABNORMAL HIGH (ref 6.7–22.6)

## 2022-02-05 LAB — PROCALCITONIN: Procalcitonin: 6.89 ng/mL

## 2022-02-05 MED ORDER — METOPROLOL TARTRATE 5 MG/5ML IV SOLN
5.0000 mg | INTRAVENOUS | Status: DC | PRN
Start: 2022-02-05 — End: 2022-02-14

## 2022-02-05 MED ORDER — SENNOSIDES-DOCUSATE SODIUM 8.6-50 MG PO TABS
1.0000 | ORAL_TABLET | Freq: Every evening | ORAL | Status: DC | PRN
Start: 2022-02-05 — End: 2022-02-14

## 2022-02-05 MED ORDER — VANCOMYCIN HCL IN DEXTROSE 1-5 GM/200ML-% IV SOLN
1000.0000 mg | Freq: Two times a day (BID) | INTRAVENOUS | Status: DC
  Filled 2022-02-05: qty 200

## 2022-02-05 MED ORDER — IPRATROPIUM-ALBUTEROL 0.5-2.5 (3) MG/3ML IN SOLN
3.0000 mL | RESPIRATORY_TRACT | Status: DC | PRN
Start: 2022-02-05 — End: 2022-02-14

## 2022-02-05 MED ORDER — GUAIFENESIN 100 MG/5ML PO LIQD: 5.0000 mL | ORAL | Status: DC | PRN

## 2022-02-05 MED ORDER — DIVALPROEX SODIUM ER 250 MG PO TB24
500.0000 mg | ORAL_TABLET | ORAL | Status: DC
  Administered 2022-02-05 – 2022-02-07 (×13): 500 mg via ORAL
  Filled 2022-02-05 (×5): qty 1
  Filled 2022-02-05: qty 2
  Filled 2022-02-05 (×4): qty 1
  Filled 2022-02-05: qty 2
  Filled 2022-02-05 (×2): qty 1

## 2022-02-05 MED ORDER — COLLAGENASE 250 UNIT/GM EX OINT
TOPICAL_OINTMENT | Freq: Every day | CUTANEOUS | Status: DC
Start: 2022-02-05 — End: 2022-02-14
  Filled 2022-02-05 (×2): qty 30

## 2022-02-05 MED ORDER — DOXYCYCLINE HYCLATE 100 MG PO TABS
100.0000 mg | ORAL_TABLET | Freq: Two times a day (BID) | ORAL | Status: DC
  Administered 2022-02-05 – 2022-02-07 (×5): 100 mg via ORAL
  Filled 2022-02-05 (×5): qty 1

## 2022-02-05 MED ORDER — OXYCODONE HCL 5 MG PO TABS: 5.0000 mg | ORAL_TABLET | ORAL | Status: DC | PRN

## 2022-02-05 MED ORDER — AMOXICILLIN-POT CLAVULANATE 875-125 MG PO TABS
1.0000 | ORAL_TABLET | Freq: Two times a day (BID) | ORAL | Status: DC
  Administered 2022-02-05 – 2022-02-07 (×5): 1 via ORAL
  Filled 2022-02-05 (×5): qty 1

## 2022-02-05 MED ORDER — SODIUM CHLORIDE 0.9 % IV BOLUS
500.0000 mL | Freq: Once | INTRAVENOUS | Status: AC
  Administered 2022-02-05: 500 mL via INTRAVENOUS

## 2022-02-05 MED ORDER — HYDRALAZINE HCL 20 MG/ML IJ SOLN
10.0000 mg | INTRAMUSCULAR | Status: DC | PRN
Start: 2022-02-05 — End: 2022-02-07

## 2022-02-05 NOTE — Consult Note (Signed)
WOC Nurse Consult Note: ?Reason for Consult: ?Multiple pressure injuries; history of quadriplegia, TBI, chronic ischium and trochanter wounds on the left. Noted patient cares for needs independently at home, bowel program, catheterizations, and wound care.  ?MRI: 02/04/22 ?Osteomyelitis of the left ischium extending from just below the ?level of the left acetabulum to the inferior left pubic ramus, ?associated with a large decubitus ulcer that extends all the way to ?the bone. ?2. No other evidence of osteomyelitis. ?3. Left extensive decubitus ulcers overlying both proximal femur ?greater trochanters. ?  ?Wound type: ?Stage 4 Pressure injury: left trochanter ?Stage 4 Pressure injury: left ischium ?Unstageable Pressure injury/deep tissue pressure injury: right trochanter  ?Pressure Injury POA: Yes ?Measurement: ?See nursing flow sheet ?Wound bed: ?Left trochanter; chronic non healing full thickness; pale; non granular ?Left ischium;pale, non granular, chronic with 25% yellow/grey slough ?Right trochanter; has evidence of healing medially, however now deep tissue pressure injury has occurred due to unresolved pressure of course ?Drainage (amount, consistency, odor)  ?Left trochanter and left ischium; foul but to be expected with underlying osteomyelitis  ?Periwound:intact  ?Dressing procedure/placement/frequency: ? ?Add low air loss mattress for moisture management and pressure redistribution ?Saline moist gauze to the left trochanter; flush with saline first ?Enzymatic debridement ointment to the left ischial wound; necrotic tissue; fluff fill reminder of the wound bed with saline damp gauze ?Add PT for hydrotherapy for the left ischial wound daily M-Saturday ?Silicone foam to the right trochanter, pending request for general surgery consult ? ? ?Re consult if needed, will not follow at this time. ?Thanks ? Shaunda [facility] MSN, RN,CWOCN, CNS, CWON-AP [phone])  ? ?  ?

## 2022-02-05 NOTE — Progress Notes (Signed)
PHARMACY - PHYSICIAN COMMUNICATION ?CRITICAL VALUE ALERT - BLOOD CULTURE IDENTIFICATION (BCID) ? ?[name] is an 51 y.o. male who presented to [facility] on 02/04/2022 with a chief complaint of sepsis from decubitus ulcer. ? ?Assessment: 1/4 BCx bottles from 4/16 growing staph species (suspect contam vs wound source) ? ?Name of physician (or Provider) ContactedNelson Chimes ? ?Current antibiotics: Augmentin, Doxy (PO) ? ?Changes to prescribed antibiotics recommended: Likely contamination if patient improved enough to narrow to PO abx; would monitor on current abx for now. ?Recommendations accepted by provider ? ? ?Results for orders placed or performed during the hospital encounter of 02/04/22  ?Blood Culture ID Panel (Reflexed) (Collected: 02/04/2022  5:10 PM)  ?Result Value Ref Range  ? Enterococcus faecalis NOT DETECTED NOT DETECTED  ? Enterococcus Faecium NOT DETECTED NOT DETECTED  ? Listeria monocytogenes NOT DETECTED NOT DETECTED  ? Staphylococcus species DETECTED (A) NOT DETECTED  ? Staphylococcus aureus (BCID) NOT DETECTED NOT DETECTED  ? Staphylococcus epidermidis NOT DETECTED NOT DETECTED  ? Staphylococcus lugdunensis NOT DETECTED NOT DETECTED  ? Streptococcus species NOT DETECTED NOT DETECTED  ? Streptococcus agalactiae NOT DETECTED NOT DETECTED  ? Streptococcus pneumoniae NOT DETECTED NOT DETECTED  ? Streptococcus pyogenes NOT DETECTED NOT DETECTED  ? A.calcoaceticus-baumannii NOT DETECTED NOT DETECTED  ? Bacteroides fragilis NOT DETECTED NOT DETECTED  ? Enterobacterales NOT DETECTED NOT DETECTED  ? Enterobacter cloacae complex NOT DETECTED NOT DETECTED  ? Escherichia coli NOT DETECTED NOT DETECTED  ? Klebsiella aerogenes NOT DETECTED NOT DETECTED  ? Klebsiella oxytoca NOT DETECTED NOT DETECTED  ? Klebsiella pneumoniae NOT DETECTED NOT DETECTED  ? Proteus species NOT DETECTED NOT DETECTED  ? Salmonella species NOT DETECTED NOT DETECTED  ? Serratia marcescens NOT DETECTED NOT DETECTED  ? Haemophilus  influenzae NOT DETECTED NOT DETECTED  ? Neisseria meningitidis NOT DETECTED NOT DETECTED  ? Pseudomonas aeruginosa NOT DETECTED NOT DETECTED  ? Stenotrophomonas maltophilia NOT DETECTED NOT DETECTED  ? Candida albicans NOT DETECTED NOT DETECTED  ? Candida auris NOT DETECTED NOT DETECTED  ? Candida glabrata NOT DETECTED NOT DETECTED  ? Candida krusei NOT DETECTED NOT DETECTED  ? Candida parapsilosis NOT DETECTED NOT DETECTED  ? Candida tropicalis NOT DETECTED NOT DETECTED  ? Cryptococcus neoformans/gattii NOT DETECTED NOT DETECTED  ? ? ?[name] A ?02/05/2022  3:54 PM ? ?

## 2022-02-05 NOTE — Progress Notes (Signed)
Pt arrived to floor from ED. Pts wounds noticed and assessed but pt refusing dressing/cleansing at this time and wanted to rest. Pts fluids initiated and scheduled abx administered. No complaints of pain.  ?
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
## 2022-02-05 NOTE — Care Plan (Signed)
Called [employer] management @ [phone] for air mattress (no answer) ?Pt has condom cath in place. ?Full linen change & pericare today/cleansed around wounds-stool & urine noted on bedpad. ?Family came to visit pt. ?Ordered meals for patient & assisted w/ setup ?Tele on. ? ? ?
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
## 2022-02-05 NOTE — Progress Notes (Signed)
?  PT Cancellation Note ? ?Patient Details ?Name: [name] ?MRN: [id number] ?DOB: 19-Sep-1971 ? ? ?Cancelled Treatment:    Reason Eval/Treat Not Completed: Other (comment)WOCRN states wound for hydro chronic and  OK to start hydrotherapy 4/18. Will evaluate 4/18. ?Tresa Endo PT ?Acute Rehabilitation Services ?Pager [phone] ?Office [phone] ? ? ? ?Tennie Grussing, Shella Maxim ?02/05/2022, 10:54 AM ?

## 2022-02-05 NOTE — Progress Notes (Signed)
Pharmacy Antibiotic Note ? ?[name] is a 51 y.o. male admitted on 02/04/2022 with  osteo .  Pharmacy has been consulted for Vanco dosing. ? ? ?ID: Sacral osteo (Takes Cipro PTA per patient) with other extensive decub ulcers  ?Rocephin 4/16>> ?Flagyl 4/16>> ?Vanco 4/16.>> ? ?4/16 BC x 2>> ? ?Dose changes ?- 4/16: Would not tell ED nurse his weight. Begin vancomycin 1250mg  IV q24 hours (eAUC 405, Scr rounded to 1, Vd 0.72, TBW of 61.8kg) ?-4/17: Vanco 1g IV q12, AUC 507, Scr 0.8 ? ? ?Plan: ?Change to Vancomycin 1000 mg IV Q 12 hrs. Goal AUC 400-550. ?Expected AUC: 507 ?SCr used: 0.8 ? ? ?Height: 6\' 2"  (188 cm) (stated per pt) ?Weight: 54.9 kg (121 lb 0.5 oz) ?IBW/kg (Calculated) : 82.2 ? ?Temp (24hrs), Avg:98.5 ?F (36.9 ?C), Min:98 ?F (36.7 ?C), Max:100.2 ?F (37.9 ?C) ? ?Recent Labs  ?Lab 02/04/22 ?1540 02/04/22 ?2142 02/05/22 ?0858  ?WBC 22.4* 20.5* 18.7*  ?CREATININE 0.86 0.78  --   ?LATICACIDVEN 1.6 1.4  --   ?  ?Estimated Creatinine Clearance: 86.7 mL/min (by C-G formula based on SCr of 0.78 mg/dL).   ? ?Allergies  ?Allergen Reactions  ? Baclofen Other (See Comments)  ?  Can cause seizures, since has epilepsy- lowers seizure threshold  ? ? ? ?[name] S. Alford Highland, PharmD, BCPS ?Clinical Staff Pharmacist ?[email or web address] ?Alford Highland, [employer] ?02/05/2022 9:49 AM ? ?

## 2022-02-06 DIAGNOSIS — M4628 Osteomyelitis of vertebra, sacral and sacrococcygeal region: Secondary | ICD-10-CM | POA: Diagnosis not present

## 2022-02-06 LAB — CBC
HCT: 23.5 % — ABNORMAL LOW (ref 39.0–52.0)
Hemoglobin: 8.2 g/dL — ABNORMAL LOW (ref 13.0–17.0)
MCH: 30.9 pg (ref 26.0–34.0)
MCHC: 34.9 g/dL (ref 30.0–36.0)
MCV: 88.7 fL (ref 80.0–100.0)
Platelets: 186 10*3/uL (ref 150–400)
RBC: 2.65 MIL/uL — ABNORMAL LOW (ref 4.22–5.81)
RDW: 16.8 % — ABNORMAL HIGH (ref 11.5–15.5)
WBC: 16.3 10*3/uL — ABNORMAL HIGH (ref 4.0–10.5)
nRBC: 0.2 % (ref 0.0–0.2)

## 2022-02-06 LAB — BASIC METABOLIC PANEL
Anion gap: 6 (ref 5–15)
BUN: 20 mg/dL (ref 6–20)
CO2: 21 mmol/L — ABNORMAL LOW (ref 22–32)
Calcium: 7.7 mg/dL — ABNORMAL LOW (ref 8.9–10.3)
Chloride: 107 mmol/L (ref 98–111)
Creatinine, Ser: 0.64 mg/dL (ref 0.61–1.24)
GFR, Estimated: >60 mL/min (ref >60)
Glucose, Bld: 80 mg/dL (ref 70–99)
Potassium: 3.4 mmol/L — ABNORMAL LOW (ref 3.5–5.1)
Sodium: 134 mmol/L — ABNORMAL LOW (ref 135–145)

## 2022-02-06 LAB — MAGNESIUM: Magnesium: 1.4 mg/dL — ABNORMAL LOW (ref 1.7–2.4)

## 2022-02-06 MED ORDER — SODIUM CHLORIDE 0.9 % IV BOLUS
500.0000 mL | Freq: Once | INTRAVENOUS | Status: AC
  Administered 2022-02-06: 500 mL via INTRAVENOUS

## 2022-02-06 NOTE — Progress Notes (Addendum)
? 02/06/22 1100 ? Hydrotherapy evaluation  ?Subjective Assessment  ?Subjective I  need my covers, I take  care of my wounds. My mother and brother work  ?Patient and Family Stated Goals go home today  ?Date of Onset  ?(presnt on admission. long standing)  ?Prior Treatments dressings by patient , wound center  ?Evaluation and Treatment  ?Evaluation and Treatment Procedures Explained to Patient/Family Yes  ?Evaluation and Treatment Procedures agreed to  ?Pressure Injury 02/06/22 Ischial tuberosity Left Stage 4 - Full thickness tissue loss with exposed bone, tendon or muscle. large and deep wound, muscle and bone exposed. PT ONLY  ?Date First Assessed/Time First Assessed: 02/06/22 1000   Location: Ischial tuberosity  Location Orientation: Left  Staging: Stage 4 - Full thickness tissue loss with exposed bone, tendon or muscle.  Wound Description (Comments): large and deep wound, ...  ?Wound Image Picture from 4/16(PT image did not pull over, wound is uncahnged today) wound is  ?Dressing Type ABD;Santyl;Normal saline moist dressing  ?Dressing  ?(wound had no dressing  on prior to  hydro)  ?Dressing Change Frequency PRN  ?State of Healing Non-healing  ?Site / Wound Assessment Granulation tissue;Yellow  ?% Wound base Red or Granulating 50%  ?% Wound base Yellow/Fibrinous Exudate 25%  ?% Wound base Other/Granulation Tissue (Comment) 25% ?(bone)  ?Peri-wound Assessment Intact  ?Wound Length (cm) 7.5 cm  ?Wound Width (cm) 6 cm  ?Wound Depth (cm) 6 cm  ?Wound Surface Area (cm^2) 45 cm^2  ?Wound Volume (cm^3) 270 cm^3  ?Tunneling (cm) 8 ?(at 12:)))  ?Margins Epibole (rolled edges) ?(on the lower border, some eschar  on the right border at 3:00)  ?Drainage Amount Moderate  ?Drainage Description Serosanguineous ?(also appears mucus from rectome mixed)  ?Treatment Debridement (Selective);Hydrotherapy (Pulse lavage);Packing (Saline gauze)  ?Hydrotherapy  ?Pulsed Lavage with Suction (psi) 8 psi  ?Pulsed Lavage with Suction - Normal  Saline Used 1000 mL  ?Pulsed Lavage Tip Tip with splash shield  ?Pulsed lavage therapy - wound location l ischial  ?Selective Debridement  ?Selective Debridement - Location left  ischiml-  right border  ?Selective Debridement - Tools Used Forceps;Scissors  ?Selective Debridement - Tissue Removed eschar  ?Wound Therapy - Assess/Plan/Recommendations  ?Wound Therapy - Clinical Statement Patient with longstanding paraplegia, LAKA, R BKA, bilateral hip wounds and large Left ischial open wound- all present for some time. Patient reports that he is responsible  for his dressings at home, does not state what he does and how often, not forthwithcoming as to his care. The  left ischial wound is large with deep tunneling, not dressed whe therapy arrived. If patient remains in hospital, consider hydro therapy M-W-F as the wound has been present for  quite some time and not healing.  ?Wound Therapy - Functional Problem List paralysis and amputations of legs  ?Factors Delaying/Impairing Wound Healing Altered sensation;Incontinence;Infection - systemic/local  ?Hydrotherapy Plan Debridement;Dressing change;Patient/family education;Pulsatile lavage with suction  ?Wound Therapy - Frequency 6X / week  ?Wound Therapy - Follow Up Recommendations dressing changes by family/patient ?(returnt to wound center)  ?Wound Therapy Goals - Improve the function of patient's integumentary system by progressing the wound(s) through the phases of wound healing by:  ?Decrease Necrotic Tissue to 10  ?Decrease Necrotic Tissue - Progress Goal set today  ?Increase Granulation Tissue to 75  ?Increase Granulation Tissue - Progress Goal set today  ?Patient/Family will be able to  dress wound appropriatelyu  ?Patient/Family Instruction Goal - Progress Goal set today  ?Goals/treatment plan/discharge plan were  made with and agreed upon by patient/family Yes  ?Time For Goal Achievement 2 weeks  ?Wound Therapy - Potential for Goals Poor ?(long standing)  ? ?Tresa Endo PT ?Acute Rehabilitation Services ?Pager [phone] ?Office [phone] ? Eval, deb <20, gun and kit ?

## 2022-02-06 NOTE — Progress Notes (Signed)
?   02/06/22 0459  ?Assess: MEWS Score  ?Temp 97.9 ?F (36.6 ?C)  ?BP (!) 74/59  ?Pulse Rate (!) 110  ?Resp 18  ?SpO2 100 %  ?O2 Device Room Air  ?Assess: MEWS Score  ?MEWS Temp 0  ?MEWS Systolic 2  ?MEWS Pulse 1  ?MEWS RR 0  ?MEWS LOC 0  ?MEWS Score 3  ?MEWS Score Color Yellow  ?Assess: if the MEWS score is Yellow or Red  ?Were vital signs taken at a resting state? Yes  ?Focused Assessment No change from prior assessment  ?Does the patient meet 2 or more of the SIRS criteria? No  ?MEWS guidelines implemented *See Row Information* Yes  ?Treat  ?MEWS Interventions Other (Comment) ?(BP typically runs soft at times, will recheck in 30 min)  ?Pain Scale 0-10  ?Pain Score 0  ?Take Vital Signs  ?Increase Vital Sign Frequency  Yellow: Q 2hr X 2 then Q 4hr X 2, if remains yellow, continue Q 4hrs  ?Escalate  ?MEWS: Escalate Yellow: discuss with charge nurse/RN and consider discussing with provider and RRT  ?Notify: Charge Nurse/RN  ?Name of Charge Nurse/RN Notified Luna Glasgow RN  ?Date Charge Nurse/RN Notified 02/06/22  ?Time Charge Nurse/RN Notified [phone]  ?Notify: Provider  ?Provider Name/Title Chinita Greenland NP  ?Date Provider Notified 02/06/22  ?Time Provider Notified [phone]  ?Notification Type Page  ?Notification Reason Other (Comment) ?(yellow MEWS)  ?Provider response See new orders  ?Assess: SIRS CRITERIA  ?SIRS Temperature  0  ?SIRS Pulse 1  ?SIRS Respirations  0  ?SIRS WBC 0  ?SIRS Score Sum  1  ? ?VS triggering yellow MEWS; BP tends to run soft at times, on recheck VS remained at yellow MEWS level. NP notified and order received for NS 500cc bolus. Will initiate yellow MEWS VS frequency and continue to monitor. Mick Sell RN  ?

## 2022-02-06 NOTE — Progress Notes (Signed)
At beginning of shift, tried to establish a rapport with pt. Pt had refused dressings on wounds during day shift per report and had refused measurement of wounds. We discussed this and he continued to refuse to allow me to apply dressings to wounds. He refused to allow me to measure wounds. He did allow me to visually assess them. He stated he would "get them taken care of" in hydrotherapy on Tuesday. We discussed what things are going to be necessary to encourage the wounds to heal; pt acknowledges what the plan of care is but does not agree to participate in it. He took his PO antibiotics as ordered and stated "the sun is going down and its time to cut the lights out, close the door, and let me rest". Continue to monitor and to encourage compliance with plan of care. Hortencia Conradi RN  ?
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
## 2022-02-07 ENCOUNTER — Inpatient Hospital Stay: Payer: Self-pay

## 2022-02-07 DIAGNOSIS — G9341 Metabolic encephalopathy: Secondary | ICD-10-CM

## 2022-02-07 DIAGNOSIS — I959 Hypotension, unspecified: Secondary | ICD-10-CM

## 2022-02-07 DIAGNOSIS — M4628 Osteomyelitis of vertebra, sacral and sacrococcygeal region: Secondary | ICD-10-CM | POA: Diagnosis not present

## 2022-02-07 LAB — CBC
HCT: 20.4 % — ABNORMAL LOW (ref 39.0–52.0)
Hemoglobin: 7.3 g/dL — ABNORMAL LOW (ref 13.0–17.0)
MCH: 30.9 pg (ref 26.0–34.0)
MCHC: 35.8 g/dL (ref 30.0–36.0)
MCV: 86.4 fL (ref 80.0–100.0)
Platelets: 157 10*3/uL (ref 150–400)
RBC: 2.36 MIL/uL — ABNORMAL LOW (ref 4.22–5.81)
RDW: 16.4 % — ABNORMAL HIGH (ref 11.5–15.5)
WBC: 18.2 10*3/uL — ABNORMAL HIGH (ref 4.0–10.5)
nRBC: 0.2 % (ref 0.0–0.2)

## 2022-02-07 LAB — TYPE AND SCREEN
ABO/RH(D): O NEG
Antibody Screen: NEGATIVE

## 2022-02-07 LAB — COMPREHENSIVE METABOLIC PANEL
ALT: 10 U/L (ref 0–44)
AST: 17 U/L (ref 15–41)
Albumin: <1.5 g/dL — ABNORMAL LOW (ref 3.5–5.0)
Alkaline Phosphatase: 65 U/L (ref 38–126)
Anion gap: 3 — ABNORMAL LOW (ref 5–15)
BUN: 15 mg/dL (ref 6–20)
CO2: 23 mmol/L (ref 22–32)
Calcium: 7.3 mg/dL — ABNORMAL LOW (ref 8.9–10.3)
Chloride: 107 mmol/L (ref 98–111)
Creatinine, Ser: 0.62 mg/dL (ref 0.61–1.24)
GFR, Estimated: >60 mL/min (ref >60)
Glucose, Bld: 76 mg/dL (ref 70–99)
Potassium: 3.2 mmol/L — ABNORMAL LOW (ref 3.5–5.1)
Sodium: 133 mmol/L — ABNORMAL LOW (ref 135–145)
Total Bilirubin: 0.6 mg/dL (ref 0.3–1.2)
Total Protein: 5.4 g/dL — ABNORMAL LOW (ref 6.5–8.1)

## 2022-02-07 LAB — URINALYSIS, ROUTINE W REFLEX MICROSCOPIC
Bilirubin Urine: NEGATIVE
Glucose, UA: NEGATIVE mg/dL
Hgb urine dipstick: NEGATIVE
Ketones, ur: NEGATIVE mg/dL
Leukocytes,Ua: NEGATIVE
Nitrite: NEGATIVE
Protein, ur: NEGATIVE mg/dL
Specific Gravity, Urine: 1.01 (ref 1.005–1.030)
pH: 5 (ref 5.0–8.0)

## 2022-02-07 LAB — CULTURE, BLOOD (ROUTINE X 2)

## 2022-02-07 LAB — FERRITIN: Ferritin: 938 ng/mL — ABNORMAL HIGH (ref 24–336)

## 2022-02-07 LAB — IRON AND TIBC
Iron: 13 ug/dL — ABNORMAL LOW (ref 45–182)
Saturation Ratios: 14 % — ABNORMAL LOW (ref 17.9–39.5)
TIBC: 96 ug/dL — ABNORMAL LOW (ref 250–450)
UIBC: 83 ug/dL

## 2022-02-07 LAB — LACTIC ACID, PLASMA: Lactic Acid, Venous: 2.3 mmol/L (ref 0.5–1.9)

## 2022-02-07 LAB — HEMOGLOBIN AND HEMATOCRIT, BLOOD
HCT: 21.9 % — ABNORMAL LOW (ref 39.0–52.0)
Hemoglobin: 7.5 g/dL — ABNORMAL LOW (ref 13.0–17.0)

## 2022-02-07 LAB — FOLATE: Folate: 4.9 ng/mL — ABNORMAL LOW (ref >5.9)

## 2022-02-07 LAB — PHOSPHORUS: Phosphorus: 3 mg/dL (ref 2.5–4.6)

## 2022-02-07 LAB — MAGNESIUM: Magnesium: 1.2 mg/dL — ABNORMAL LOW (ref 1.7–2.4)

## 2022-02-07 LAB — PREPARE RBC (CROSSMATCH)

## 2022-02-07 LAB — VITAMIN B12: Vitamin B-12: 1888 pg/mL — ABNORMAL HIGH (ref 180–914)

## 2022-02-07 MED ORDER — MIDODRINE HCL 5 MG PO TABS
5.0000 mg | ORAL_TABLET | Freq: Three times a day (TID) | ORAL | Status: DC
  Administered 2022-02-07 – 2022-02-10 (×9): 5 mg via ORAL
  Filled 2022-02-07 (×8): qty 1

## 2022-02-07 MED ORDER — SODIUM CHLORIDE 0.9 % IV SOLN
2.0000 g | Freq: Three times a day (TID) | INTRAVENOUS | Status: DC
  Administered 2022-02-08 (×2): 2 g via INTRAVENOUS
  Filled 2022-02-07 (×3): qty 12.5

## 2022-02-07 MED ORDER — POTASSIUM CHLORIDE CRYS ER 20 MEQ PO TBCR
40.0000 meq | EXTENDED_RELEASE_TABLET | Freq: Once | ORAL | Status: AC
  Administered 2022-02-07: 40 meq via ORAL
  Filled 2022-02-07: qty 2

## 2022-02-07 MED ORDER — SODIUM CHLORIDE 0.9% IV SOLUTION: Freq: Once | INTRAVENOUS | Status: DC

## 2022-02-07 MED ORDER — LACTATED RINGERS IV BOLUS
1000.0000 mL | Freq: Once | INTRAVENOUS | Status: AC
  Administered 2022-02-07: 1000 mL via INTRAVENOUS

## 2022-02-07 MED ORDER — CHLORHEXIDINE GLUCONATE CLOTH 2 % EX PADS
6.0000 | MEDICATED_PAD | Freq: Every day | CUTANEOUS | Status: DC
  Administered 2022-02-08 – 2022-02-13 (×3): 6 via TOPICAL

## 2022-02-07 MED ORDER — MAGNESIUM SULFATE 4 GM/100ML IV SOLN
4.0000 g | Freq: Once | INTRAVENOUS | Status: DC
Start: 2022-02-07 — End: 2022-02-07

## 2022-02-07 MED ORDER — VANCOMYCIN HCL IN DEXTROSE 1-5 GM/200ML-% IV SOLN
1000.0000 mg | Freq: Two times a day (BID) | INTRAVENOUS | Status: DC
  Administered 2022-02-08: 1000 mg via INTRAVENOUS
  Filled 2022-02-07: qty 200

## 2022-02-07 MED ORDER — ALPRAZOLAM 0.25 MG PO TABS
0.2500 mg | ORAL_TABLET | Freq: Once | ORAL | Status: DC | PRN
Start: 2022-02-07 — End: 2022-02-14
  Filled 2022-02-07: qty 1

## 2022-02-07 MED ORDER — METRONIDAZOLE 500 MG PO TABS
500.0000 mg | ORAL_TABLET | Freq: Two times a day (BID) | ORAL | Status: DC
  Administered 2022-02-07 – 2022-02-08 (×2): 500 mg via ORAL
  Filled 2022-02-07 (×2): qty 1

## 2022-02-07 MED ORDER — VANCOMYCIN HCL 1250 MG/250ML IV SOLN
1250.0000 mg | Freq: Once | INTRAVENOUS | Status: AC
Start: 2022-02-07 — End: 2022-02-07
  Administered 2022-02-07: 1250 mg via INTRAVENOUS
  Filled 2022-02-07: qty 250

## 2022-02-07 MED ORDER — MAGNESIUM SULFATE 4 GM/100ML IV SOLN
4.0000 g | INTRAVENOUS | Status: AC
  Administered 2022-02-07: 4 g via INTRAVENOUS
  Filled 2022-02-07: qty 100

## 2022-02-07 NOTE — Assessment & Plan Note (Addendum)
Suspected from infection.  However, patient has history of TBI with some degree of cognitive impairment ?

## 2022-02-07 NOTE — Progress Notes (Signed)
PICC order received. Spoke with Primary RN, patient's BP is in the low 70'/30's. Not hemodynamically stable for PICC placement at this time. Recommend central line placement if access needed urgently.  ?

## 2022-02-07 NOTE — Consult Note (Signed)
Spoke with PT regarding hydrotherapy, updated orders for both wounds and decreased frequency to M/W/F ? ?Lakechia Nay [facility], CNS, CWON-AP ?[phone]  ?
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
## 2022-02-07 NOTE — Assessment & Plan Note (Addendum)
Continue home Depakote 

## 2022-02-07 NOTE — Plan of Care (Signed)
?  Problem: Nutrition: ?Goal: Adequate nutrition will be maintained ?Outcome: Progressing ?  ?Problem: Elimination: ?Goal: Will not experience complications related to bowel motility ?Outcome: Progressing ?Goal: Will not experience complications related to urinary retention ?Outcome: Progressing ?  ?Problem: Pain Managment: ?Goal: General experience of comfort will improve ?Outcome: Progressing ?  ?  ?

## 2022-02-07 NOTE — Progress Notes (Signed)
Pharmacy Antibiotic Note ? ?[name] is a 51 y.o. male admitted on 02/04/2022 with sepsis due to sacral osteomyelitis.  Pharmacy has been consulted for vancomycin and cefepime dosing. ? ?Plan: ?Vancomycin 1250mg  IV x 1, then 1g IV q12h for estimated AUC 451, SCr rounded to 0.8, Vd 0.72L/kg ?Check vancomycin levels at steady state, goal AUC 400-550 ?Cefepime 2g IV q8h ?Flagyl 500mg  PO q12h ? ?Height: 6\' 2"  (188 cm) (stated per pt) ?Weight: 54.9 kg (121 lb 0.5 oz) ?IBW/kg (Calculated) : 82.2 ? ?Temp (24hrs), Avg:98.6 ?F (37 ?C), Min:98 ?F (36.7 ?C), Max:99.8 ?F (37.7 ?C) ? ?Recent Labs  ?Lab 02/04/22 ?1540 02/04/22 ?2142 02/05/22 ?0858 02/06/22 ?02/06/22 02/07/22 ?02/08/22 02/07/22 ?1313  ?WBC 22.4* 20.5* 18.7* 16.3* 18.2*  --   ?CREATININE 0.86 0.78 0.70 0.64 0.62  --   ?LATICACIDVEN 1.6 1.4  --   --   --  2.3*  ?  ?Estimated Creatinine Clearance: 86.7 mL/min (by C-G formula based on SCr of 0.62 mg/dL).   ? ?Allergies  ?Allergen Reactions  ? Baclofen Other (See Comments)  ?  Can cause seizures, since has epilepsy- lowers seizure threshold  ? ? ?Antimicrobials this admission: ?Rocephin 4/16>>4/17 ?Flagyl 4/16>>4/17, resume 4/19 >> ?Vanco 4/16.>>4/17, resume 4/19 >> ?Cefepime 4/19 >> ?Augmentin 4/17>4/19 ?Doxy 4/17>4/19 ? ?Dose adjustments this admission: ? ?Microbiology results: ?4/16 BCx: 1/4 staph sp (resistance not tested); already narrowed to PO Aug/Doxy d/t improvement, likely contam; monitor on current abx for now ?4/18 BCx: ?4/19 BCx: ? ?Thank you for allowing pharmacy to be a part of this patient?s care. ? ?5/16, PharmD, BCPS ?Pharmacy: [phone] ?02/07/2022 4:05 PM ? ?

## 2022-02-07 NOTE — Assessment & Plan Note (Addendum)
Likely from autonomic dysregulation.  Could be related to sepsis as well.    ?-Discharged on midodrine 5 mg 3 times daily ?

## 2022-02-07 NOTE — Assessment & Plan Note (Addendum)
Discharge on p.o. Augmentin and doxycycline per ID recommendation ?Daily wound care per WOCN recommendation ?Home health RN for wound care ?Patient has upcoming appointment at wound care center starting 02/21/2022. ?Appreciate help by general surgery, orthopedic surgery and ID ? ?
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
## 2022-02-07 NOTE — Progress Notes (Signed)
Physical Therapy Wound Treatment ?Patient Details  ?Name: [name] ?MRN: [id number] ?Date of Birth: 1971-02-03 ? ?Today's Date: 02/07/2022 ?Time: 6144-3154 ?Time Calculation (min): 64 min ? ?Subjective  ?Subjective Assessment ?Subjective: I  need my covers, I take  care of my wounds. My mother and brother work ?Patient and Family Stated Goals: go home today ?Date of Onset:  (presnt on admission. long standing) ?Prior Treatments: dressings by patient , wound center  ?Pain Score:   ? ?Wound Assessment  ?Pressure Injury 02/06/22 Ischial tuberosity Left Stage 4 - Full thickness tissue loss with exposed bone, tendon or muscle. large and deep wound, muscle and bone exposed. PT ONLY (Active)  ?Wound Image    02/07/22 1345  ?Dressing Type ABD;Santyl;Normal saline moist dressing 02/07/22 1345  ?Dressing Changed 02/07/22 1345  ?Dressing Change Frequency PRN 02/07/22 1345  ?State of Healing Non-healing 02/07/22 1345  ?Site / Wound Assessment Granulation tissue;Yellow 02/07/22 1345  ?% Wound base Red or Granulating 50% 02/07/22 1345  ?% Wound base Yellow/Fibrinous Exudate 25% 02/07/22 1345  ?% Wound base Black/Eschar 0% 02/07/22 1345  ?% Wound base Other/Granulation Tissue (Comment) 25% 02/07/22 1345  ?Peri-wound Assessment Intact 02/07/22 1345  ?Wound Length (cm) 7.5 cm 02/06/22 1100  ?Wound Width (cm) 6 cm 02/06/22 1100  ?Wound Depth (cm) 6 cm 02/06/22 1100  ?Wound Surface Area (cm^2) 45 cm^2 02/06/22 1100  ?Wound Volume (cm^3) 270 cm^3 02/06/22 1100  ?Tunneling (cm) 8 02/06/22 1100  ?Margins Epibole (rolled edges) 02/07/22 1345  ?Drainage Amount Moderate 02/07/22 1345  ?Drainage Description Serosanguineous 02/07/22 1345  ?Treatment Debridement (Selective);Cleansed;Hydrotherapy (Pulse lavage);Packing (Saline gauze) 02/07/22 1345  ?   ?Incision (Closed) 12/18/21 Sacrum Left (Active)  ?Drainage Description Serous;Odor - foul 02/05/22 0431  ? ?Hydrotherapy ?Pulsed lavage therapy - wound location: Lt ischial ?Pulsed  Lavage with Suction (psi): 8 psi ?Pulsed Lavage with Suction - Normal Saline Used: 1000 mL ?Pulsed Lavage Tip: Tip with splash shield ?Selective Debridement ?Selective Debridement - Location: left  ischiml-  right border ?Selective Debridement - Tools Used: Forceps, Scissors ?Selective Debridement - Tissue Removed: eschar  ? ? ?Wound Assessment and Plan  ?Wound Therapy - Assess/Plan/Recommendations ?Wound Therapy - Clinical Statement: Attempted in mornign to see for hydrotherapy, BP too low but improved with bolus and completed in afternoon. Pt prefers mornings. Pt's wound not significantly changed from yesterday's treatment. continued with pulsed lavage and able to debride more necrotic tissue from large ischial wound and loose slough from greater trochanter wound. Dressed with santyl and saline conform gauze for packing. he will continue to benefit from hydrotherapy to promote healthy wound environement adn reduce biofburden. Will continue MWF for PT hydrotherapy and dressing changes to be performed by RN on days when hydro not performed. ?Wound Therapy - Functional Problem List: paralysis and amputations of legs ?Factors Delaying/Impairing Wound Healing: Altered sensation, Incontinence, Infection - systemic/local ?Hydrotherapy Plan: Debridement, Dressing change, Patient/family education, Pulsatile lavage with suction ?Wound Therapy - Frequency: 6X / week ?Wound Therapy - Follow Up Recommendations: dressing changes by family/patient (returnt to wound center) ? ?Wound Therapy Goals- ?Improve the function of patient's integumentary system by progressing the wound(s) through the phases of wound healing (inflammation - proliferation - remodeling) by: ?Wound Therapy Goals - Improve the function of patient's integumentary system by progressing the wound(s) through the phases of wound healing by: ?Decrease Necrotic Tissue to: 10 ?Decrease Necrotic Tissue - Progress: Progressing toward goal ?Increase Granulation Tissue  to: 75 ?Increase Granulation Tissue - Progress: Progressing toward goal ?Patient/Family  will be able to : dress wound appropriatelyu ?Patient/Family Instruction Goal - Progress: Progressing toward goal ?Goals/treatment plan/discharge plan were made with and agreed upon by patient/family: Yes ?Time For Goal Achievement: 2 weeks ?Wound Therapy - Potential for Goals: Poor (long standing) ? ?Goals will be updated until maximal potential achieved or discharge criteria met.  Discharge criteria: when goals achieved, discharge from hospital, MD decision/surgical intervention, no progress towards goals, refusal/missing three consecutive treatments without notification or medical reason. ? ?GP ?   ? ?Charges ?PT Wound Care Charges ?$Wound Debridement up to 20 cm: < or equal to 20 cm ?$PT Hydrotherapy Dressing: 2 dressings ?$PT PLS Gun and Tip: 1 Supply ?$PT Hydrotherapy Visit: 1 Visit ?  ?  ?Gwynneth Albright PT, DPT ?Acute Rehabilitation Services ?Office [phone] ?Pager [phone]  ? ?Jacques Navy ?02/07/2022, 5:08 PM ? ?

## 2022-02-08 DIAGNOSIS — G9341 Metabolic encephalopathy: Secondary | ICD-10-CM

## 2022-02-08 DIAGNOSIS — G903 Multi-system degeneration of the autonomic nervous system: Secondary | ICD-10-CM | POA: Diagnosis not present

## 2022-02-08 DIAGNOSIS — M869 Osteomyelitis, unspecified: Secondary | ICD-10-CM

## 2022-02-08 DIAGNOSIS — G8250 Quadriplegia, unspecified: Secondary | ICD-10-CM | POA: Diagnosis not present

## 2022-02-08 DIAGNOSIS — L89154 Pressure ulcer of sacral region, stage 4: Secondary | ICD-10-CM | POA: Diagnosis not present

## 2022-02-08 DIAGNOSIS — S14109S Unspecified injury at unspecified level of cervical spinal cord, sequela: Secondary | ICD-10-CM | POA: Diagnosis not present

## 2022-02-08 DIAGNOSIS — N319 Neuromuscular dysfunction of bladder, unspecified: Secondary | ICD-10-CM

## 2022-02-08 DIAGNOSIS — G40309 Generalized idiopathic epilepsy and epileptic syndromes, not intractable, without status epilepticus: Secondary | ICD-10-CM

## 2022-02-08 DIAGNOSIS — M4628 Osteomyelitis of vertebra, sacral and sacrococcygeal region: Secondary | ICD-10-CM | POA: Diagnosis not present

## 2022-02-08 LAB — COMPREHENSIVE METABOLIC PANEL
ALT: 10 U/L (ref 0–44)
AST: 17 U/L (ref 15–41)
Albumin: <1.5 g/dL — ABNORMAL LOW (ref 3.5–5.0)
Alkaline Phosphatase: 59 U/L (ref 38–126)
Anion gap: 6 (ref 5–15)
BUN: 13 mg/dL (ref 6–20)
CO2: 18 mmol/L — ABNORMAL LOW (ref 22–32)
Calcium: 7.4 mg/dL — ABNORMAL LOW (ref 8.9–10.3)
Chloride: 107 mmol/L (ref 98–111)
Creatinine, Ser: 0.72 mg/dL (ref 0.61–1.24)
GFR, Estimated: >60 mL/min (ref >60)
Glucose, Bld: 91 mg/dL (ref 70–99)
Potassium: 3.9 mmol/L (ref 3.5–5.1)
Sodium: 131 mmol/L — ABNORMAL LOW (ref 135–145)
Total Bilirubin: 0.3 mg/dL (ref 0.3–1.2)
Total Protein: 5.1 g/dL — ABNORMAL LOW (ref 6.5–8.1)

## 2022-02-08 LAB — URINE CULTURE
Culture: NO GROWTH
Special Requests: NORMAL

## 2022-02-08 LAB — LACTIC ACID, PLASMA: Lactic Acid, Venous: 2.3 mmol/L (ref 0.5–1.9)

## 2022-02-08 LAB — TYPE AND SCREEN
ABO/RH(D): O NEG
Antibody Screen: NEGATIVE
Unit division: 0

## 2022-02-08 LAB — MAGNESIUM: Magnesium: 1.4 mg/dL — ABNORMAL LOW (ref 1.7–2.4)

## 2022-02-08 LAB — BPAM RBC
Blood Product Expiration Date: 202304282359
ISSUE DATE / TIME: 202304191845
Unit Type and Rh: 9500

## 2022-02-08 LAB — CBC
HCT: 24.3 % — ABNORMAL LOW (ref 39.0–52.0)
Hemoglobin: 8.1 g/dL — ABNORMAL LOW (ref 13.0–17.0)
MCH: 30.1 pg (ref 26.0–34.0)
MCHC: 33.3 g/dL (ref 30.0–36.0)
MCV: 90.3 fL (ref 80.0–100.0)
Platelets: 122 10*3/uL — ABNORMAL LOW (ref 150–400)
RBC: 2.69 MIL/uL — ABNORMAL LOW (ref 4.22–5.81)
RDW: 16.7 % — ABNORMAL HIGH (ref 11.5–15.5)
WBC: 17.4 10*3/uL — ABNORMAL HIGH (ref 4.0–10.5)
nRBC: 0.4 % — ABNORMAL HIGH (ref 0.0–0.2)

## 2022-02-08 LAB — PROCALCITONIN: Procalcitonin: 2.68 ng/mL

## 2022-02-08 LAB — VALPROIC ACID LEVEL: Valproic Acid Lvl: 47 ug/mL — ABNORMAL LOW (ref 50.0–100.0)

## 2022-02-08 MED ORDER — POTASSIUM CHLORIDE 20 MEQ PO PACK
40.0000 meq | PACK | Freq: Two times a day (BID) | ORAL | Status: AC
  Administered 2022-02-08 (×2): 40 meq via ORAL
  Filled 2022-02-08 (×2): qty 2

## 2022-02-08 MED ORDER — DAKINS (1/4 STRENGTH) 0.125 % EX SOLN
Freq: Two times a day (BID) | CUTANEOUS | Status: AC
Start: 2022-02-08 — End: 2022-02-11
  Filled 2022-02-08: qty 473

## 2022-02-08 MED ORDER — DIVALPROEX SODIUM ER 250 MG PO TB24: 500.0000 mg | ORAL_TABLET | Freq: Every day | ORAL | Status: DC

## 2022-02-08 MED ORDER — DOXYCYCLINE HYCLATE 100 MG PO TABS
100.0000 mg | ORAL_TABLET | Freq: Two times a day (BID) | ORAL | Status: DC
  Administered 2022-02-08 – 2022-02-12 (×9): 100 mg via ORAL
  Filled 2022-02-08 (×12): qty 1

## 2022-02-08 MED ORDER — AMOXICILLIN-POT CLAVULANATE 875-125 MG PO TABS
1.0000 | ORAL_TABLET | Freq: Two times a day (BID) | ORAL | Status: DC
  Administered 2022-02-08 – 2022-02-11 (×6): 1 via ORAL
  Filled 2022-02-08 (×9): qty 1

## 2022-02-08 MED ORDER — MAGNESIUM SULFATE 2 GM/50ML IV SOLN
2.0000 g | Freq: Once | INTRAVENOUS | Status: AC
  Administered 2022-02-08: 2 g via INTRAVENOUS
  Filled 2022-02-08: qty 50

## 2022-02-08 MED ORDER — SODIUM CHLORIDE 0.9% FLUSH: 10.0000 mL | INTRAVENOUS | Status: DC | PRN

## 2022-02-08 MED ORDER — SODIUM CHLORIDE 0.9 % IV BOLUS
1000.0000 mL | Freq: Once | INTRAVENOUS | Status: AC
Start: 2022-02-08 — End: 2022-02-08
  Administered 2022-02-08: 1000 mL via INTRAVENOUS

## 2022-02-08 MED ORDER — DIVALPROEX SODIUM ER 500 MG PO TB24
500.0000 mg | ORAL_TABLET | Freq: Two times a day (BID) | ORAL | Status: DC
  Administered 2022-02-08 – 2022-02-13 (×10): 500 mg via ORAL
  Filled 2022-02-08 (×10): qty 1
  Filled 2022-02-08 (×2): qty 2
  Filled 2022-02-08 (×2): qty 1

## 2022-02-08 MED ORDER — SODIUM CHLORIDE 0.9% FLUSH
10.0000 mL | Freq: Two times a day (BID) | INTRAVENOUS | Status: DC
  Administered 2022-02-08 – 2022-02-12 (×3): 10 mL

## 2022-02-08 NOTE — Progress Notes (Signed)
eLink Physician-Brief Progress Note ?Patient Name: [name] ?DOB: 1971-03-20 ?MRN: [id number] ? ? ?Date of Service ? 02/08/2022  ?HPI/Events of Note ? Patient has a neurogenic bladder with recurrent urinary retention.  ?eICU Interventions ? Foley catheter protocol ordered.  ? ? ? ?  ? ?Thomasene Lot Brenn Gatton ?02/08/2022, 2:02 AM ?

## 2022-02-08 NOTE — Progress Notes (Signed)
Peripherally Inserted Central Catheter Placement ? ?The IV Nurse has discussed with the patient and/or persons authorized to consent for the patient, the purpose of this procedure and the potential benefits and risks involved with this procedure.  The benefits include less needle sticks, lab draws from the catheter, and the patient may be discharged home with the catheter. Risks include, but not limited to, infection, bleeding, blood clot (thrombus formation), and puncture of an artery; nerve damage and irregular heartbeat and possibility to perform a PICC exchange if needed/ordered by physician.  Alternatives to this procedure were also discussed.  Bard Power PICC patient education guide, fact sheet on infection prevention and patient information card has been provided to patient /or left at bedside.   ? ?PICC Placement Documentation  ?PICC Single Lumen 02/08/22 Right Brachial 45 cm 0 cm (Active)  ?Indication for Insertion or Continuance of Line Prolonged intravenous therapies 02/08/22 1229  ?Exposed Catheter (cm) 0 cm 02/08/22 1229  ?Site Assessment Clean, Dry, Intact 02/08/22 1229  ?Line Status Flushed;Blood return noted;Saline locked 02/08/22 1229  ?Dressing Type Transparent 02/08/22 1229  ?Dressing Status Antimicrobial disc in place 02/08/22 1229  ?Dressing Change Due 02/15/22 02/08/22 1229  ? ? ? ? ? ?[name] ?02/08/2022, 12:31 PM ? ?

## 2022-02-08 NOTE — Progress Notes (Signed)
? ? ?   ?Subjective: ?Asked to resee this patient and his wounds secondary to hypotension.  No new complaints since I saw him several days ago.  He has since been moved to the unit for some hypotension.  BPs are better this morning.   ? ?ROS: See above, otherwise other systems negative ? ?Objective: ?Vital signs in last 24 hours: ?Temp:  [97.9 ?F (36.6 ?C)-99 ?F (37.2 ?C)] 98.6 ?F (37 ?C) (04/20 0000) ?Pulse Rate:  [88-135] 108 (04/20 0600) ?Resp:  [14-33] 20 (04/20 0500) ?BP: (71-161)/(30-116) 113/56 (04/20 0600) ?SpO2:  [94 %-100 %] 97 % (04/20 0600) ?Last BM Date : 02/07/22 ? ?Intake/Output from previous day: ?04/19 0701 - 04/20 0700 ?In: 1453.1 [P.O.:240; I.V.:365.8; Blood:405; IV Piggyback:442.4] ?Out: 1200 [Urine:1200] ?Intake/Output this shift: ?Total I/O ?In: 10.8 [IV Piggyback:10.8] ?Out: -  ? ?PE: ?Gen: NAD ?Skin: L trochanteric wound is clean with no evidence of infection.  L ischial wound is mostly clean.  This wound goes down to his bone that is palpable in the wound.  There is an undermining of the tissue posterior/cephalad with some fibrin and necrotic tissue present, but mostly clinging to the edges.  There is some thin drainage, but no purulent drainage.  Odor c/w pseudomonas.  L trochanteric wound with a necrotic skin flap present over the wound.  This was removed with an 11 blade at the bedside.  No bleeding.  This wound is not infected. ? ?Lab Results:  ?Recent Labs  ?  02/07/22 ?[id number] 02/07/22 ?1313 02/08/22 ?[id number]  ?WBC 18.2*  --  17.4*  ?HGB 7.3* 7.5* 8.1*  ?HCT 20.4* 21.9* 24.3*  ?PLT 157  --  122*  ? ?BMET ?Recent Labs  ?  02/07/22 ?[id number] 02/08/22 ?[id number]  ?NA 133* 131*  ?K 3.2* 3.9  ?CL 107 107  ?CO2 23 18*  ?GLUCOSE 76 91  ?BUN 15 13  ?CREATININE 0.62 0.72  ?CALCIUM 7.3* 7.4*  ? ?PT/INR ?Recent Labs  ?  02/05/22 ?0858  ?LABPROT 17.3*  ?INR 1.4*  ? ?CMP  ?   ?Component Value Date/Time  ? NA 131 (L) 02/08/2022 [id number]  ? NA 143 09/19/2016 1002  ? K 3.9 02/08/2022 0722  ? CL 107 02/08/2022 0722  ?  CO2 18 (L) 02/08/2022 0722  ? GLUCOSE 91 02/08/2022 0722  ? BUN 13 02/08/2022 0722  ? BUN 17 09/19/2016 1002  ? CREATININE 0.72 02/08/2022 0722  ? CREATININE 0.81 05/26/2018 1038  ? CALCIUM 7.4 (L) 02/08/2022 [id number]  ? PROT 5.1 (L) 02/08/2022 [id number]  ? PROT 7.0 09/19/2016 1002  ? ALBUMIN <1.5 (L) 02/08/2022 [id number]  ? ALBUMIN 3.7 09/19/2016 1002  ? AST 17 02/08/2022 0722  ? ALT 10 02/08/2022 0722  ? ALKPHOS 59 02/08/2022 0722  ? BILITOT 0.3 02/08/2022 0722  ? BILITOT 0.6 09/19/2016 1002  ? GFRNONAA >60 02/08/2022 0722  ? GFRNONAA 109 11/21/2017 1422  ? GFRAA >60 02/10/2020 0645  ? GFRAA 126 11/21/2017 1422  ? ?Lipase  ?No results found for: LIPASE ? ? ? ? ?Studies/Results: ?[place] EKG SITE RITE ? ?Result Date: 02/07/2022 ?If [employer] not attached, placement could not be confirmed due to current cardiac rhythm.  ? ?Anti-infectives: ?Anti-infectives (From admission, onward)  ? ? Start     Dose/Rate Route Frequency Ordered Stop  ? 02/08/22 0600  vancomycin (VANCOCIN) IVPB 1000 mg/200 mL premix       ? 1,000 mg ?200 mL/hr over 60 Minutes Intravenous Every 12 hours 02/07/22 1616    ?  02/07/22 2200  metroNIDAZOLE (FLAGYL) tablet 500 mg       ? 500 mg Oral Every 12 hours 02/07/22 1602    ? 02/07/22 1700  vancomycin (VANCOREADY) IVPB 1250 mg/250 mL       ? 1,250 mg ?166.7 mL/hr over 90 Minutes Intravenous  Once 02/07/22 1615 02/07/22 2338  ? 02/07/22 1700  ceFEPIme (MAXIPIME) 2 g in sodium chloride 0.9 % 100 mL IVPB       ? 2 g ?200 mL/hr over 30 Minutes Intravenous Every 8 hours 02/07/22 1616    ? 02/05/22 1200  doxycycline (VIBRA-TABS) tablet 100 mg  Status:  Discontinued       ? 100 mg Oral Every 12 hours 02/05/22 1102 02/07/22 1602  ? 02/05/22 1200  amoxicillin-clavulanate (AUGMENTIN) 875-125 MG per tablet 1 tablet  Status:  Discontinued       ? 1 tablet Oral Every 12 hours 02/05/22 1102 02/07/22 1602  ? 02/05/22 1027  vancomycin (VANCOCIN) IVPB 1000 mg/200 mL premix  Status:  Discontinued       ? 1,000 mg ?200 mL/hr over 60  Minutes Intravenous Every 12 hours 02/05/22 0946 02/05/22 1102  ? 02/05/22 0600  metroNIDAZOLE (FLAGYL) IVPB 500 mg  Status:  Discontinued       ? 500 mg ?100 mL/hr over 60 Minutes Intravenous Every 12 hours 02/04/22 2115 02/05/22 1102  ? 02/04/22 2215  vancomycin (VANCOCIN) IVPB 1000 mg/200 mL premix  Status:  Discontinued       ? 1,000 mg ?200 mL/hr over 60 Minutes Intravenous  Once 02/04/22 2115 02/04/22 2116  ? 02/04/22 2215  cefTRIAXone (ROCEPHIN) 2 g in sodium chloride 0.9 % 100 mL IVPB  Status:  Discontinued       ? 2 g ?200 mL/hr over 30 Minutes Intravenous Every 24 hours 02/04/22 2115 02/05/22 1102  ? 02/04/22 2200  vancomycin (VANCOREADY) IVPB 1250 mg/250 mL  Status:  Discontinued       ? 1,250 mg ?166.7 mL/hr over 90 Minutes Intravenous Every 24 hours 02/04/22 1931 02/05/22 0946  ? 02/04/22 1800  ceFEPIme (MAXIPIME) 2 g in sodium chloride 0.9 % 100 mL IVPB       ? 2 g ?200 mL/hr over 30 Minutes Intravenous  Once 02/04/22 1751 02/04/22 1930  ? 02/04/22 1800  metroNIDAZOLE (FLAGYL) IVPB 500 mg       ? 500 mg ?100 mL/hr over 60 Minutes Intravenous  Once 02/04/22 1751 02/04/22 1943  ? 02/04/22 1800  vancomycin (VANCOCIN) IVPB 1000 mg/200 mL premix  Status:  Discontinued       ? 1,000 mg ?200 mL/hr over 60 Minutes Intravenous  Once 02/04/22 1751 02/04/22 2159  ? ?  ? ? ? ?Assessment/Plan ?B trochanteric, L ischial wounds with osteomyelitis ?-overall these wounds are generally clean and do not require any surgical debridement.  No evidence of infection that requires drainage. ?-his ischial wound has undermining now that hydrotherapy has worked with him for several days.  This has some thin slightly cloudy fluid that drains from this undermining but with digital exploration of this area that is no undrained collections or purulent drainage.  It does smell of pseudomonas so will order Dakin's solution for 3 days to this wound. ?-can continue hydrotherapy to this wound and add hydrotherapy to the R trochanteric  wound to help debride the more superficial appearing laying of eschar present.  I debrided the flap off of this wound today.  The wound itself is very clean. ?-I do  not believe his wounds are the etiology of his sepsis, but he has significant osteo noted on his CT scan and this could certainly still be contributing.  Defer to medicine and ID for treatment of this ?-UA was negative currently.  If osteo felt to be well controlled, would defer to medicine on work up for other source to be identified. ?-call if further needs arise.  Nothing surgical to offer at this time. ? ? ?I reviewed hospitalist notes, last 24 h vitals and pain scores, last 48 h intake and output, last 24 h labs and trends, and last 24 h imaging results. ? ? LOS: 4 days  ? ? ?Henreitta Cea , PA-C ?[facility] ?02/08/2022, 8:03 AM ?Please see Amion for pager number during day hours 7:00am-4:30pm or 7:00am -11:30am on weekends ? ?

## 2022-02-08 NOTE — TOC Progression Note (Signed)
Transition of Care (TOC) - Progression Note  ? ? ?Patient Details  ?Name: [name] ?MRN: [id number] ?Date of Birth: 1971-10-12 ? ?Transition of Care (TOC) CM/SW Contact  ?Lanier Clam, RN ?Phone Number: ?02/08/2022, 11:52 AM ? ?Clinical Narrative:  [employer] rep Cheryl following for HHC-HHPT/RN if ordered;[employer] rep following for iv home abx if ordered.  ? ? ? ?Expected Discharge Plan: Home w Home Health Services ?Barriers to Discharge: Continued Medical Work up ? ?Expected Discharge Plan and Services ?Expected Discharge Plan: Home w Home Health Services ?  ?  ?  ?Living arrangements for the past 2 months: Single Family Home ?                ?  ?  ?  ?  ?  ?  ?  ?  ?  ?  ? ? ?Social Determinants of Health (SDOH) Interventions ?  ? ?Readmission Risk Interventions ?   ? View : No data to display.  ?  ?  ?  ? ? ?
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
## 2022-02-09 DIAGNOSIS — G9341 Metabolic encephalopathy: Secondary | ICD-10-CM | POA: Diagnosis not present

## 2022-02-09 DIAGNOSIS — M4628 Osteomyelitis of vertebra, sacral and sacrococcygeal region: Secondary | ICD-10-CM | POA: Diagnosis not present

## 2022-02-09 DIAGNOSIS — S14109S Unspecified injury at unspecified level of cervical spinal cord, sequela: Secondary | ICD-10-CM | POA: Diagnosis not present

## 2022-02-09 DIAGNOSIS — Z89511 Acquired absence of right leg below knee: Secondary | ICD-10-CM

## 2022-02-09 DIAGNOSIS — M869 Osteomyelitis, unspecified: Secondary | ICD-10-CM | POA: Diagnosis not present

## 2022-02-09 LAB — CBC
HCT: 23.2 % — ABNORMAL LOW (ref 39.0–52.0)
Hemoglobin: 8.2 g/dL — ABNORMAL LOW (ref 13.0–17.0)
MCH: 31.1 pg (ref 26.0–34.0)
MCHC: 35.3 g/dL (ref 30.0–36.0)
MCV: 87.9 fL (ref 80.0–100.0)
Platelets: 114 10*3/uL — ABNORMAL LOW (ref 150–400)
RBC: 2.64 MIL/uL — ABNORMAL LOW (ref 4.22–5.81)
RDW: 16.9 % — ABNORMAL HIGH (ref 11.5–15.5)
WBC: 17.4 10*3/uL — ABNORMAL HIGH (ref 4.0–10.5)
nRBC: 0.4 % — ABNORMAL HIGH (ref 0.0–0.2)

## 2022-02-09 LAB — BASIC METABOLIC PANEL
Anion gap: 5 (ref 5–15)
BUN: 11 mg/dL (ref 6–20)
CO2: 20 mmol/L — ABNORMAL LOW (ref 22–32)
Calcium: 7.8 mg/dL — ABNORMAL LOW (ref 8.9–10.3)
Chloride: 110 mmol/L (ref 98–111)
Creatinine, Ser: 0.61 mg/dL (ref 0.61–1.24)
GFR, Estimated: >60 mL/min (ref >60)
Glucose, Bld: 87 mg/dL (ref 70–99)
Potassium: 4.3 mmol/L (ref 3.5–5.1)
Sodium: 135 mmol/L (ref 135–145)

## 2022-02-09 LAB — CULTURE, BLOOD (ROUTINE X 2): Culture: NO GROWTH

## 2022-02-09 LAB — MAGNESIUM: Magnesium: 1.5 mg/dL — ABNORMAL LOW (ref 1.7–2.4)

## 2022-02-09 LAB — C-REACTIVE PROTEIN: CRP: 19.9 mg/dL — ABNORMAL HIGH (ref <1.0)

## 2022-02-09 LAB — PROCALCITONIN: Procalcitonin: 2.37 ng/mL

## 2022-02-09 MED ORDER — ORAL CARE MOUTH RINSE
15.0000 mL | Freq: Two times a day (BID) | OROMUCOSAL | Status: DC
  Administered 2022-02-09 – 2022-02-10 (×2): 15 mL via OROMUCOSAL

## 2022-02-09 NOTE — Care Management Important Message (Signed)
Important Message ? ?Patient Details IM Letter given to the Patient. ?Name: [name] ?MRN: [id number] ?Date of Birth: [birth year] ? ? ?Medicare Important Message Given:  Yes ? ? ? ? ?Caren Macadam ?02/09/2022, 1:25 PM ?

## 2022-02-09 NOTE — Progress Notes (Signed)
Once patient arrived to floor, patient has been very uncooperative. Patient will follow commands, but gets very agitated and irritated when giving patient care. Patient had refused to let nurse assess patient's lungs, heart, and abdomen. Foley was placed per order due to bladder scan showing 632 mL of urine in bladder and patient had been in and out cath at least 3 times in the last 24 hours. Patient was also uncooperative when trying to change dressing changes. Patient refused some of the dressing changes. Made on-coming nurse aware of soft blood pressures, wound changes, and uncooperativeness.  ?

## 2022-02-09 NOTE — Progress Notes (Signed)
Physical Therapy Wound Treatment ?Patient Details  ?Name: [name] ?MRN: [id number] ?Date of Birth: 05-17-1971 ? ?Today's Date: 02/09/2022 ?Time: 1320-1400 ?Time Calculation (min): 40 min ? ?Subjective  ?Subjective Assessment ?Subjective: you just do what you do ?Patient and Family Stated Goals: go home today ?Date of Onset:  (presnt on admission. long standing) ?Prior Treatments: dressings by patient , wound center  ?Pain Score:   ? ?Wound Assessment  ?Pressure Injury 02/06/22 Ischial tuberosity Left Stage 4 - Full thickness tissue loss with exposed bone, tendon or muscle. large and deep wound, muscle and bone exposed. PT ONLY (Active)  ?Wound Image   02/09/22 1400  ?Dressing Type ABD;Gauze (Comment);Dakin's-soaked gauze;Barrier Film (skin prep) 02/09/22 1400  ?Dressing Changed 02/09/22 1400  ?Dressing Change Frequency PRN 02/09/22 1400  ?State of Healing Non-healing 02/09/22 1400  ?Site / Wound Assessment Granulation tissue;Yellow 02/09/22 1400  ?% Wound base Red or Granulating 55% 02/09/22 1400  ?% Wound base Yellow/Fibrinous Exudate 25% 02/09/22 1400  ?% Wound base Black/Eschar 0% 02/09/22 1400  ?% Wound base Other/Granulation Tissue (Comment) 30% 02/09/22 1400  ?Peri-wound Assessment Intact;Pink 02/09/22 1400  ?Wound Length (cm) 7.5 cm 02/06/22 1100  ?Wound Width (cm) 6 cm 02/06/22 1100  ?Wound Depth (cm) 6 cm 02/06/22 1100  ?Wound Surface Area (cm^2) 45 cm^2 02/06/22 1100  ?Wound Volume (cm^3) 270 cm^3 02/06/22 1100  ?Tunneling (cm) 8 02/06/22 1100  ?Margins Epibole (rolled edges) 02/09/22 1400  ?Drainage Amount Moderate 02/09/22 1400  ?Drainage Description Serosanguineous 02/09/22 1400  ?Treatment Cleansed;Debridement (Selective);Hydrotherapy (Pulse lavage);Other (Comment);Packing (Saline gauze) 02/09/22 1400  ?   ?Pressure Injury 02/09/22 Hip Left;Lateral Stage 4 - Full thickness tissue loss with exposed bone, tendon or muscle. Greater Trochanter Wound PT HYDRO Only (Active)  ?Wound Image    02/09/22 1400  ?Dressing Type Foam - Lift dressing to assess site every shift;Gauze (Comment);Santyl;Barrier Film (skin prep);Normal saline moist dressing 02/09/22 1400  ?Dressing Changed 02/09/22 1400  ?Dressing Change Frequency PRN 02/09/22 1400  ?State of Healing Non-healing 02/09/22 1400  ?Site / Wound Assessment Pink;Red;Yellow 02/09/22 1400  ?% Wound base Red or Granulating 60% 02/09/22 1400  ?% Wound base Yellow/Fibrinous Exudate 30% 02/09/22 1400  ?% Wound base Black/Eschar 0% 02/09/22 1400  ?% Wound base Other/Granulation Tissue (Comment) 10% 02/09/22 1400  ?Peri-wound Assessment Intact;Pink 02/09/22 1400  ?Margins Epibole (rolled edges) 02/09/22 1400  ?Drainage Amount Moderate 02/09/22 1400  ?Drainage Description Serosanguineous 02/09/22 1400  ?Treatment Cleansed;Debridement (Selective);Hydrotherapy (Pulse lavage);Packing (Saline gauze) 02/09/22 1400  ? ?Hydrotherapy ?Pulsed lavage therapy - wound location: Lt ischial and greater trochanter wounds ?Pulsed Lavage with Suction (psi): 8 psi ?Pulsed Lavage with Suction - Normal Saline Used: 1000 mL ?Pulsed Lavage Tip: Tip with splash shield ?Selective Debridement ?Selective Debridement - Location: Lt ischial and greater trochanter wounds ?Selective Debridement - Tools Used: Forceps, Scissors ?Selective Debridement - Tissue Removed: eschar  ? ? ?Wound Assessment and Plan  ?Wound Therapy - Assess/Plan/Recommendations ?Wound Therapy - Clinical Statement: Patient's ischial wound with slightly reduced necrotic tissue and able to debride eschar along wound margins with scissors and forceps today. Slough debrided form trochanter wound on Lt as well and pulsed lavage performed to irrigate and remove drainage and loose necrotic tissue. Pt tolerated procdure and dressings continued with dakins gauze in large ischial wound and santyl with saline soaked gauze in trochanter wound. Will continue MWF for PT hydrotherapy and dressing changes to be performed by RN on days when  hydro not performed. ?Wound Therapy - Functional Problem List:  paralysis and amputations of legs ?Factors Delaying/Impairing Wound Healing: Altered sensation, Incontinence, Infection - systemic/local ?Hydrotherapy Plan: Debridement, Dressing change, Patient/family education, Pulsatile lavage with suction ?Wound Therapy - Frequency: 6X / week ?Wound Therapy - Follow Up Recommendations: dressing changes by family/patient (returnt to wound center) ? ?Wound Therapy Goals- ?Improve the function of patient's integumentary system by progressing the wound(s) through the phases of wound healing (inflammation - proliferation - remodeling) by: ?Wound Therapy Goals - Improve the function of patient's integumentary system by progressing the wound(s) through the phases of wound healing by: ?Decrease Necrotic Tissue to: 10 ?Decrease Necrotic Tissue - Progress: Progressing toward goal ?Increase Granulation Tissue to: 75 ?Increase Granulation Tissue - Progress: Progressing toward goal ?Patient/Family will be able to : dress wound appropriatelyu ?Patient/Family Instruction Goal - Progress: Progressing toward goal ?Goals/treatment plan/discharge plan were made with and agreed upon by patient/family: Yes ?Time For Goal Achievement: 2 weeks ?Wound Therapy - Potential for Goals: Poor (long standing) ? ?Goals will be updated until maximal potential achieved or discharge criteria met.  Discharge criteria: when goals achieved, discharge from hospital, MD decision/surgical intervention, no progress towards goals, refusal/missing three consecutive treatments without notification or medical reason. ? ?GP ?   ? ?Charges ?PT Wound Care Charges ?$Wound Debridement up to 20 cm: < or equal to 20 cm ?$ Wound Debridement each add'l 20 sqcm: 1 ?$PT Hydrotherapy Dressing: 1 dressing ?$PT PLS Gun and Tip: 1 Supply ?$PT Hydrotherapy Visit: 2 Visits ?  ?  ?Gwynneth Albright PT, DPT ?Acute Rehabilitation Services ?Office [phone] ?Pager [phone]  ? ?Jacques Navy ?02/09/2022, 3:32 PM ? ?
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
## 2022-02-09 NOTE — Plan of Care (Signed)
  Problem: Pain Managment: Goal: General experience of comfort will improve Outcome: Progressing   Problem: Safety: Goal: Ability to remain free from injury will improve Outcome: Progressing   Problem: Skin Integrity: Goal: Risk for impaired skin integrity will decrease Outcome: Progressing   

## 2022-02-09 NOTE — Progress Notes (Signed)
?   02/08/22 2306  ?Assess: MEWS Score  ?Temp 99.5 ?F (37.5 ?C)  ?BP (!) 90/55  ?Pulse Rate (!) 104  ?Resp 18  ?Level of Consciousness Alert  ?SpO2 98 %  ?O2 Device Room Air  ?Patient Activity (if Appropriate) In bed  ?Assess: MEWS Score  ?MEWS Temp 0  ?MEWS Systolic 1  ?MEWS Pulse 1  ?MEWS RR 0  ?MEWS LOC 0  ?MEWS Score 2  ?MEWS Score Color Yellow  ?Assess: if the MEWS score is Yellow or Red  ?Were vital signs taken at a resting state? Yes  ?Focused Assessment No change from prior assessment  ?Does the patient meet 2 or more of the SIRS criteria? No  ?MEWS guidelines implemented *See Row Information* Yes  ?Treat  ?MEWS Interventions Other (Comment)  ?Pain Scale 0-10  ?Pain Score 0  ?Neuro symptoms relieved by Rest  ?Take Vital Signs  ?Increase Vital Sign Frequency  Yellow: Q 2hr X 2 then Q 4hr X 2, if remains yellow, continue Q 4hrs  ?Notify: Charge Nurse/RN  ?Name of Charge Nurse/RN Notified [place], RN  ?Date Charge Nurse/RN Notified 02/08/22  ?Time Charge Nurse/RN Notified 2330  ?Document  ?Patient Outcome Other (Comment)  ?Progress note created (see row info) Yes  ?Assess: SIRS CRITERIA  ?SIRS Temperature  0  ?SIRS Pulse 1  ?SIRS Respirations  0  ?SIRS WBC 0  ?SIRS Score Sum  1  ? ? ?

## 2022-02-10 DIAGNOSIS — M4628 Osteomyelitis of vertebra, sacral and sacrococcygeal region: Secondary | ICD-10-CM | POA: Diagnosis not present

## 2022-02-10 DIAGNOSIS — G9341 Metabolic encephalopathy: Secondary | ICD-10-CM | POA: Diagnosis not present

## 2022-02-10 DIAGNOSIS — S14109S Unspecified injury at unspecified level of cervical spinal cord, sequela: Secondary | ICD-10-CM | POA: Diagnosis not present

## 2022-02-10 DIAGNOSIS — G903 Multi-system degeneration of the autonomic nervous system: Secondary | ICD-10-CM | POA: Diagnosis not present

## 2022-02-10 LAB — BASIC METABOLIC PANEL
Anion gap: 3 — ABNORMAL LOW (ref 5–15)
BUN: 10 mg/dL (ref 6–20)
CO2: 22 mmol/L (ref 22–32)
Calcium: 7.7 mg/dL — ABNORMAL LOW (ref 8.9–10.3)
Chloride: 108 mmol/L (ref 98–111)
Creatinine, Ser: 0.6 mg/dL — ABNORMAL LOW (ref 0.61–1.24)
GFR, Estimated: >60 mL/min (ref >60)
Glucose, Bld: 90 mg/dL (ref 70–99)
Potassium: 4.3 mmol/L (ref 3.5–5.1)
Sodium: 133 mmol/L — ABNORMAL LOW (ref 135–145)

## 2022-02-10 LAB — CBC
HCT: 22.3 % — ABNORMAL LOW (ref 39.0–52.0)
Hemoglobin: 7.8 g/dL — ABNORMAL LOW (ref 13.0–17.0)
MCH: 30.8 pg (ref 26.0–34.0)
MCHC: 35 g/dL (ref 30.0–36.0)
MCV: 88.1 fL (ref 80.0–100.0)
Platelets: 106 10*3/uL — ABNORMAL LOW (ref 150–400)
RBC: 2.53 MIL/uL — ABNORMAL LOW (ref 4.22–5.81)
RDW: 16.8 % — ABNORMAL HIGH (ref 11.5–15.5)
WBC: 21.6 10*3/uL — ABNORMAL HIGH (ref 4.0–10.5)
nRBC: 0.3 % — ABNORMAL HIGH (ref 0.0–0.2)

## 2022-02-10 LAB — MAGNESIUM: Magnesium: 1.4 mg/dL — ABNORMAL LOW (ref 1.7–2.4)

## 2022-02-10 MED ORDER — MIDODRINE HCL 5 MG PO TABS
2.5000 mg | ORAL_TABLET | Freq: Two times a day (BID) | ORAL | Status: DC
  Administered 2022-02-11 – 2022-02-12 (×2): 2.5 mg via ORAL
  Filled 2022-02-10 (×4): qty 1
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
## 2022-02-10 NOTE — Progress Notes (Signed)
Patient is refusing wound care/dressing change at this time. After multiple attempts of education and explanation we have agreed not to do wound care this evening because the patient was becoming agitated.  ?

## 2022-02-10 NOTE — Progress Notes (Addendum)
Patient refuses wound care and any vitals to be taken this evening shift. ?

## 2022-02-11 ENCOUNTER — Inpatient Hospital Stay (HOSPITAL_COMMUNITY): Payer: Medicare Other

## 2022-02-11 DIAGNOSIS — S14109S Unspecified injury at unspecified level of cervical spinal cord, sequela: Secondary | ICD-10-CM | POA: Diagnosis not present

## 2022-02-11 DIAGNOSIS — G9341 Metabolic encephalopathy: Secondary | ICD-10-CM | POA: Diagnosis not present

## 2022-02-11 DIAGNOSIS — M4628 Osteomyelitis of vertebra, sacral and sacrococcygeal region: Secondary | ICD-10-CM | POA: Diagnosis not present

## 2022-02-11 DIAGNOSIS — G903 Multi-system degeneration of the autonomic nervous system: Secondary | ICD-10-CM | POA: Diagnosis not present

## 2022-02-11 LAB — CBC
HCT: 22.8 % — ABNORMAL LOW (ref 39.0–52.0)
Hemoglobin: 7.7 g/dL — ABNORMAL LOW (ref 13.0–17.0)
MCH: 30.2 pg (ref 26.0–34.0)
MCHC: 33.8 g/dL (ref 30.0–36.0)
MCV: 89.4 fL (ref 80.0–100.0)
Platelets: 108 10*3/uL — ABNORMAL LOW (ref 150–400)
RBC: 2.55 MIL/uL — ABNORMAL LOW (ref 4.22–5.81)
RDW: 16.7 % — ABNORMAL HIGH (ref 11.5–15.5)
WBC: 26.9 10*3/uL — ABNORMAL HIGH (ref 4.0–10.5)
nRBC: 0.1 % (ref 0.0–0.2)

## 2022-02-11 LAB — CULTURE, BLOOD (ROUTINE X 2)
Culture: NO GROWTH
Culture: NO GROWTH
Special Requests: ADEQUATE
Special Requests: ADEQUATE

## 2022-02-11 LAB — BASIC METABOLIC PANEL WITH GFR
Anion gap: 6 (ref 5–15)
BUN: 16 mg/dL (ref 6–20)
CO2: 23 mmol/L (ref 22–32)
Calcium: 8.1 mg/dL — ABNORMAL LOW (ref 8.9–10.3)
Chloride: 103 mmol/L (ref 98–111)
Creatinine, Ser: 0.76 mg/dL (ref 0.61–1.24)
GFR, Estimated: >60 mL/min
Glucose, Bld: 62 mg/dL — ABNORMAL LOW (ref 70–99)
Potassium: 4.5 mmol/L (ref 3.5–5.1)
Sodium: 132 mmol/L — ABNORMAL LOW (ref 135–145)

## 2022-02-11 LAB — MAGNESIUM: Magnesium: 1.4 mg/dL — ABNORMAL LOW (ref 1.7–2.4)

## 2022-02-11 MED ORDER — SODIUM CHLORIDE (PF) 0.9 % IJ SOLN
INTRAMUSCULAR | Status: AC
Start: 2022-02-11 — End: 2022-02-11
  Filled 2022-02-11: qty 50

## 2022-02-11 MED ORDER — IOHEXOL 9 MG/ML PO SOLN
ORAL | Status: AC
  Filled 2022-02-11: qty 1000

## 2022-02-11 MED ORDER — IOHEXOL 9 MG/ML PO SOLN
500.0000 mL | ORAL | Status: AC
  Administered 2022-02-11 (×2): 500 mL via ORAL

## 2022-02-11 MED ORDER — IOHEXOL 300 MG/ML  SOLN
100.0000 mL | Freq: Once | INTRAMUSCULAR | Status: AC | PRN
  Administered 2022-02-11: 100 mL via INTRAVENOUS

## 2022-02-11 NOTE — Progress Notes (Signed)
5409: Attempted to give patient medications on MAR. Patient stated he wasn't taking them this late. Educated and explained to patient the importance of taking his medications. Patient persistent on not taking them and told this RN to leave his room. On call notified. Will retry in an hour per MD request. ? ?2300: Attempted to give patient medications again. Patient still refusing. Patient reeducated.  ?

## 2022-02-11 NOTE — Progress Notes (Incomplete)
0277: Attempted to give patient medications on MAR. Patient stated he wasn't taking them this late. Educated and explained to patient the importance of taking his medications. Patient persistent on not taking them and told this RN to leave his room. On call notified. Will retry in an hour per MD request. ? ?[phone] ?
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
## 2022-02-11 NOTE — TOC Progression Note (Signed)
Transition of Care (TOC) - Progression Note  ? ? ?Patient Details  ?Name: [name] ?MRN: [id number] ?Date of Birth: Dec 04, 1970 ? ?Transition of Care (TOC) CM/SW Contact  ?Darleene Cleaver, LCSW ?Phone Number: ?02/11/2022, 4:14 PM ? ?Clinical Narrative:    ? ?Home health PT and RN have been set up with [facility].  CSW to continue to follow patient's progress throughout discharge.  Pam from [facility] is following patient in case he needs IV antibiotics.                                                    ? ?Expected Discharge Plan: Home w Home Health Services ?Barriers to Discharge: Continued Medical Work up ? ?Expected Discharge Plan and Services ?Expected Discharge Plan: Home w Home Health Services ?  ?  ?  ?Living arrangements for the past 2 months: Single Family Home ?                ?  ?  ?  ?  ?  ?  ?  ?  ?  ?  ? ? ?Social Determinants of Health (SDOH) Interventions ?  ? ?Readmission Risk Interventions ?   ? View : No data to display.  ?  ?  ?  ? ? ?

## 2022-02-11 NOTE — Progress Notes (Signed)
PT Cancellation Note ? ?Patient Details ?Name: [name] ?MRN: [id number] ?DOB: 12/29/1970 ? ? ?Cancelled Treatment:    Reason Eval/Treat Not Completed: Other (comment) Nurse reports pt not taking meds or participating in much care, but his brother is coming later to encourage the patient and that PT would likely have better luck when family is present.  Will check back. ? ? ?[name] ?02/11/2022, 11:59 AM ?
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
## 2022-02-12 DIAGNOSIS — M4628 Osteomyelitis of vertebra, sacral and sacrococcygeal region: Secondary | ICD-10-CM | POA: Diagnosis not present

## 2022-02-12 DIAGNOSIS — S14109S Unspecified injury at unspecified level of cervical spinal cord, sequela: Secondary | ICD-10-CM | POA: Diagnosis not present

## 2022-02-12 DIAGNOSIS — G40309 Generalized idiopathic epilepsy and epileptic syndromes, not intractable, without status epilepticus: Secondary | ICD-10-CM | POA: Diagnosis not present

## 2022-02-12 DIAGNOSIS — Z89612 Acquired absence of left leg above knee: Secondary | ICD-10-CM

## 2022-02-12 LAB — CBC
HCT: 21 % — ABNORMAL LOW (ref 39.0–52.0)
Hemoglobin: 7 g/dL — ABNORMAL LOW (ref 13.0–17.0)
MCH: 29.9 pg (ref 26.0–34.0)
MCHC: 33.3 g/dL (ref 30.0–36.0)
MCV: 89.7 fL (ref 80.0–100.0)
Platelets: 94 10*3/uL — ABNORMAL LOW (ref 150–400)
RBC: 2.34 MIL/uL — ABNORMAL LOW (ref 4.22–5.81)
RDW: 16.6 % — ABNORMAL HIGH (ref 11.5–15.5)
WBC: 23.9 10*3/uL — ABNORMAL HIGH (ref 4.0–10.5)
nRBC: 0 % (ref 0.0–0.2)

## 2022-02-12 LAB — HEMOGLOBIN AND HEMATOCRIT, BLOOD
HCT: 24.4 % — ABNORMAL LOW (ref 39.0–52.0)
Hemoglobin: 8.3 g/dL — ABNORMAL LOW (ref 13.0–17.0)

## 2022-02-12 LAB — BASIC METABOLIC PANEL
Anion gap: 3 — ABNORMAL LOW (ref 5–15)
BUN: 17 mg/dL (ref 6–20)
CO2: 25 mmol/L (ref 22–32)
Calcium: 7.7 mg/dL — ABNORMAL LOW (ref 8.9–10.3)
Chloride: 103 mmol/L (ref 98–111)
Creatinine, Ser: 0.55 mg/dL — ABNORMAL LOW (ref 0.61–1.24)
GFR, Estimated: >60 mL/min (ref >60)
Glucose, Bld: 60 mg/dL — ABNORMAL LOW (ref 70–99)
Potassium: 3.9 mmol/L (ref 3.5–5.1)
Sodium: 131 mmol/L — ABNORMAL LOW (ref 135–145)

## 2022-02-12 LAB — C-REACTIVE PROTEIN: CRP: 19.5 mg/dL — ABNORMAL HIGH (ref <1.0)

## 2022-02-12 LAB — LACTIC ACID, PLASMA
Lactic Acid, Venous: 1 mmol/L (ref 0.5–1.9)
Lactic Acid, Venous: 1 mmol/L (ref 0.5–1.9)

## 2022-02-12 LAB — PREPARE RBC (CROSSMATCH)

## 2022-02-12 LAB — MAGNESIUM: Magnesium: 1.3 mg/dL — ABNORMAL LOW (ref 1.7–2.4)

## 2022-02-12 MED ORDER — MAGNESIUM SULFATE 2 GM/50ML IV SOLN
2.0000 g | Freq: Once | INTRAVENOUS | Status: DC
  Filled 2022-02-12: qty 50

## 2022-02-12 MED ORDER — AMOXICILLIN-POT CLAVULANATE 400-57 MG/5ML PO SUSR
875.0000 mg | Freq: Two times a day (BID) | ORAL | Status: DC
  Administered 2022-02-12: 875 mg via ORAL
  Filled 2022-02-12 (×3): qty 10.9

## 2022-02-12 MED ORDER — MIDODRINE HCL 5 MG PO TABS
5.0000 mg | ORAL_TABLET | Freq: Two times a day (BID) | ORAL | Status: DC
Start: 2022-02-12 — End: 2022-02-13
  Administered 2022-02-12 – 2022-02-13 (×2): 5 mg via ORAL
  Filled 2022-02-12 (×3): qty 1

## 2022-02-12 MED ORDER — LACTATED RINGERS IV BOLUS
500.0000 mL | Freq: Once | INTRAVENOUS | Status: AC
  Administered 2022-02-12: 500 mL via INTRAVENOUS

## 2022-02-12 MED ORDER — SODIUM CHLORIDE 0.9% IV SOLUTION: Freq: Once | INTRAVENOUS | Status: AC

## 2022-02-12 MED ORDER — SODIUM CHLORIDE 0.9 % IV BOLUS
1000.0000 mL | Freq: Once | INTRAVENOUS | Status: AC
  Administered 2022-02-12: 1000 mL via INTRAVENOUS

## 2022-02-12 MED ORDER — ENSURE ENLIVE PO LIQD
237.0000 mL | Freq: Two times a day (BID) | ORAL | Status: DC
  Administered 2022-02-14: 237 mL via ORAL

## 2022-02-12 MED ORDER — ADULT MULTIVITAMIN W/MINERALS CH
1.0000 | ORAL_TABLET | Freq: Every day | ORAL | Status: DC
  Filled 2022-02-12 (×3): qty 1

## 2022-02-12 MED ORDER — JUVEN PO PACK
1.0000 | PACK | Freq: Two times a day (BID) | ORAL | Status: DC
  Administered 2022-02-12 – 2022-02-13 (×2): 1 via ORAL
  Filled 2022-02-12 (×5): qty 1

## 2022-02-12 MED ORDER — MAGNESIUM SULFATE 4 GM/100ML IV SOLN
4.0000 g | Freq: Once | INTRAVENOUS | Status: AC
Start: 2022-02-12 — End: 2022-02-12
  Administered 2022-02-12: 4 g via INTRAVENOUS
  Filled 2022-02-12: qty 100

## 2022-02-12 NOTE — Progress Notes (Signed)
? ?Progress Note ? ?   ?Subjective: ?Asked to re-evaluate wounds after CT over the weekend showed collection adjacent to lesser trochanter on the left. Dressings being changed BID. Pt with persistent leukocytosis. Tolerating hydrotherapy well.  ? ?Objective: ?Vital signs in last 24 hours: ?Temp:  [97.8 ?F (36.6 ?C)-98.6 ?F (37 ?C)] 97.8 ?F (36.6 ?C) (04/24 0650) ?Pulse Rate:  [90-94] 90 (04/24 0650) ?Resp:  [17-26] 25 (04/24 1200) ?BP: (77-91)/(50-58) 91/57 (04/24 0650) ?SpO2:  [95 %-100 %] 100 % (04/24 0650) ?Weight:  [61.7 kg] 61.7 kg (04/24 0458) ?Last BM Date : 02/08/22 ? ?Intake/Output from previous day: ?04/23 0701 - 04/24 0700 ?In: 938.8 [I.V.:206.2; IV Piggyback:732.6] ?Out: 802 [Urine:800; Stool:2] ?Intake/Output this shift: ?Total I/O ?In: 10 [I.V.:10] ?Out: -  ? ?PE: ?Gen: NAD ?Skin: L trochanteric wound is clean with no evidence of infection.  L ischial wound is mostly clean.  This wound goes down to his bone that is palpable in the wound.  There is an undermining of the tissue posterior/cephalad with some fibrin and necrotic tissue present. There is some thin drainage, but no purulent drainage.  No odor today.  L trochanteric wound with some necrotic/fibrinous tissue present  ? ? ?Lab Results:  ?Recent Labs  ?  02/11/22 ?[id number] 02/12/22 ?0357  ?WBC 26.9* 23.9*  ?HGB 7.7* 7.0*  ?HCT 22.8* 21.0*  ?PLT 108* 94*  ? ?BMET ?Recent Labs  ?  02/11/22 ?[id number] 02/12/22 ?0357  ?NA 132* 131*  ?K 4.5 3.9  ?CL 103 103  ?CO2 23 25  ?GLUCOSE 62* 60*  ?BUN 16 17  ?CREATININE 0.76 0.55*  ?CALCIUM 8.1* 7.7*  ? ?PT/INR ?No results for input(s): LABPROT, INR in the last 72 hours. ?CMP  ?   ?Component Value Date/Time  ? NA 131 (L) 02/12/2022 0357  ? NA 143 09/19/2016 1002  ? K 3.9 02/12/2022 0357  ? CL 103 02/12/2022 0357  ? CO2 25 02/12/2022 0357  ? GLUCOSE 60 (L) 02/12/2022 0357  ? BUN 17 02/12/2022 0357  ? BUN 17 09/19/2016 1002  ? CREATININE 0.55 (L) 02/12/2022 0357  ? CREATININE 0.81 05/26/2018 1038  ? CALCIUM 7.7 (L)  02/12/2022 0357  ? PROT 5.1 (L) 02/08/2022 [id number]  ? PROT 7.0 09/19/2016 1002  ? ALBUMIN <1.5 (L) 02/08/2022 [id number]  ? ALBUMIN 3.7 09/19/2016 1002  ? AST 17 02/08/2022 0722  ? ALT 10 02/08/2022 0722  ? ALKPHOS 59 02/08/2022 0722  ? BILITOT 0.3 02/08/2022 0722  ? BILITOT 0.6 09/19/2016 1002  ? GFRNONAA >60 02/12/2022 0357  ? GFRNONAA 109 11/21/2017 1422  ? GFRAA >60 02/10/2020 0645  ? GFRAA 126 11/21/2017 1422  ? ?Lipase  ?No results found for: LIPASE ? ? ? ? ?Studies/Results: ?CT ABDOMEN PELVIS W WO CONTRAST ? ?Result Date: 02/11/2022 ?CLINICAL DATA:  Abdominal pain EXAM: CT ABDOMEN AND PELVIS WITHOUT AND WITH CONTRAST TECHNIQUE: Multidetector CT imaging of the abdomen and pelvis was performed following the standard protocol before and following the bolus administration of intravenous contrast. RADIATION DOSE REDUCTION: This exam was performed according to the departmental dose-optimization program which includes automated exposure control, adjustment of the mA and/or kV according to patient size and/or use of iterative reconstruction technique. CONTRAST:  180mL OMNIPAQUE IOHEXOL 300 MG/ML  SOLN COMPARISON:  CT abdomen pelvis, 12/17/2021, CT pelvis, 02/04/2022 FINDINGS: Lower chest: Small left pleural effusion associated atelectasis or consolidation. Hepatobiliary: No solid liver abnormality is seen. No gallstones, gallbladder wall thickening, or biliary dilatation. Pancreas: Unremarkable. No pancreatic ductal dilatation or  surrounding inflammatory changes. Spleen: Normal in size without significant abnormality. Adrenals/Urinary Tract: Adrenal glands are unremarkable. Kidneys are normal, without renal calculi, solid lesion, or hydronephrosis. Bladder is decompressed by a Foley catheter. Thickening of the urinary bladder. Stomach/Bowel: Stomach is within normal limits. Appendix appears normal. The colon is fluid-filled to the rectum. Vascular/Lymphatic: Aortic atherosclerosis. No enlarged abdominal or pelvic lymph nodes.  Reproductive: No mass or other significant abnormality. Other: No abdominal wall hernia or abnormality. Small volume ascites. Anasarca. Musculoskeletal: Generally unchanged appearance of large decubitus ulcerations overlying the bilateral greater trochanters and left ischial, with left greater trochanteric and ischial ulcerations open to bone. Sclerosis of the left ischial ramus. Rim enhancing air and fluid collection within the proximal medial left thigh overlying the left lesser trochanter and adjacent to large decubitus wound measuring 4.7 x 3.2 cm (series 3, image 100). This was not clearly appreciated on recent noncontrast CT. IMPRESSION: 1. No specific acute CT findings of the abdomen or pelvis to explain abdominal pain. 2. Colon is fluid-filled to the rectum, consistent with diarrheal illness. 3. Generally unchanged appearance of large decubitus ulcerations overlying the bilateral greater trochanters and left ischial, with left greater trochanteric and ischial ulcerations open to bone. Sclerosis of the left ischial ramus consistent with osteomyelitis. 4. Rim enhancing air and fluid collection within the proximal medial left thigh overlying the left lesser trochanter and adjacent to large decubitus wound measuring 4.7 x 3.2 cm. This is concerning for abscess and was not clearly appreciated on recent noncontrast CT. 5. Small left pleural effusion. 6. Anasarca. Aortic Atherosclerosis (ICD10-I70.0). Electronically Signed   By: Delanna Ahmadi M.D.   On: 02/11/2022 17:47   ? ?Anti-infectives: ?Anti-infectives (From admission, onward)  ? ? Start     Dose/Rate Route Frequency Ordered Stop  ? 02/12/22 1215  amoxicillin-clavulanate (AUGMENTIN) 400-57 MG/5ML suspension 875 mg       ? 875 mg Oral Every 12 hours 02/12/22 1117 02/17/22 2359  ? 02/08/22 1600  amoxicillin-clavulanate (AUGMENTIN) 875-125 MG per tablet 1 tablet  Status:  Discontinued       ? 1 tablet Oral Every 12 hours 02/08/22 1357 02/12/22 1117  ? 02/08/22  1445  doxycycline (VIBRA-TABS) tablet 100 mg       ? 100 mg Oral Every 12 hours 02/08/22 1357 02/17/22 0959  ? 02/08/22 0600  vancomycin (VANCOCIN) IVPB 1000 mg/200 mL premix  Status:  Discontinued       ? 1,000 mg ?200 mL/hr over 60 Minutes Intravenous Every 12 hours 02/07/22 1616 02/08/22 1357  ? 02/07/22 2200  metroNIDAZOLE (FLAGYL) tablet 500 mg  Status:  Discontinued       ? 500 mg Oral Every 12 hours 02/07/22 1602 02/08/22 1357  ? 02/07/22 1700  vancomycin (VANCOREADY) IVPB 1250 mg/250 mL       ? 1,250 mg ?166.7 mL/hr over 90 Minutes Intravenous  Once 02/07/22 1615 02/07/22 2338  ? 02/07/22 1700  ceFEPIme (MAXIPIME) 2 g in sodium chloride 0.9 % 100 mL IVPB  Status:  Discontinued       ? 2 g ?200 mL/hr over 30 Minutes Intravenous Every 8 hours 02/07/22 1616 02/08/22 1357  ? 02/05/22 1200  doxycycline (VIBRA-TABS) tablet 100 mg  Status:  Discontinued       ? 100 mg Oral Every 12 hours 02/05/22 1102 02/07/22 1602  ? 02/05/22 1200  amoxicillin-clavulanate (AUGMENTIN) 875-125 MG per tablet 1 tablet  Status:  Discontinued       ? 1 tablet Oral Every  12 hours 02/05/22 1102 02/07/22 1602  ? 02/05/22 1027  vancomycin (VANCOCIN) IVPB 1000 mg/200 mL premix  Status:  Discontinued       ? 1,000 mg ?200 mL/hr over 60 Minutes Intravenous Every 12 hours 02/05/22 0946 02/05/22 1102  ? 02/05/22 0600  metroNIDAZOLE (FLAGYL) IVPB 500 mg  Status:  Discontinued       ? 500 mg ?100 mL/hr over 60 Minutes Intravenous Every 12 hours 02/04/22 2115 02/05/22 1102  ? 02/04/22 2215  vancomycin (VANCOCIN) IVPB 1000 mg/200 mL premix  Status:  Discontinued       ? 1,000 mg ?200 mL/hr over 60 Minutes Intravenous  Once 02/04/22 2115 02/04/22 2116  ? 02/04/22 2215  cefTRIAXone (ROCEPHIN) 2 g in sodium chloride 0.9 % 100 mL IVPB  Status:  Discontinued       ? 2 g ?200 mL/hr over 30 Minutes Intravenous Every 24 hours 02/04/22 2115 02/05/22 1102  ? 02/04/22 2200  vancomycin (VANCOREADY) IVPB 1250 mg/250 mL  Status:  Discontinued       ? 1,250  mg ?166.7 mL/hr over 90 Minutes Intravenous Every 24 hours 02/04/22 1931 02/05/22 0946  ? 02/04/22 1800  ceFEPIme (MAXIPIME) 2 g in sodium chloride 0.9 % 100 mL IVPB       ? 2 g ?200 mL/hr over 30 Minutes [place]

## 2022-02-12 NOTE — Progress Notes (Signed)
?   02/12/22 0458  ?Assess: MEWS Score  ?Temp 98 ?F (36.7 ?C)  ?BP (!) 77/50  ?Pulse Rate 91  ?Resp (!) 24  ?SpO2 100 %  ?O2 Device Room Air  ?Assess: MEWS Score  ?MEWS Temp 0  ?MEWS Systolic 2  ?MEWS Pulse 0  ?MEWS RR 1  ?MEWS LOC 0  ?MEWS Score 3  ?MEWS Score Color Yellow  ?Assess: if the MEWS score is Yellow or Red  ?Were vital signs taken at a resting state? Yes  ?Focused Assessment No change from prior assessment  ?Does the patient meet 2 or more of the SIRS criteria? Yes  ?Does the patient have a confirmed or suspected source of infection? Yes  ?Provider and Rapid Response Notified? No  ?Treat  ?MEWS Interventions Administered scheduled meds/treatments  ?Pain Scale 0-10  ?Pain Score 0  ?Take Vital Signs  ?Increase Vital Sign Frequency  Yellow: Q 2hr X 2 then Q 4hr X 2, if remains yellow, continue Q 4hrs  ?Escalate  ?MEWS: Escalate Yellow: discuss with charge nurse/RN and consider discussing with provider and RRT  ?Notify: Charge Nurse/RN  ?Name of Charge Nurse/RN Notified Lauren, RN  ?Date Charge Nurse/RN Notified 02/12/22  ?Time Charge Nurse/RN Notified 0503  ?Notify: Provider  ?Provider Name/Title Toniann Fail, MD  ?Date Provider Notified 02/12/22  ?Time Provider Notified 0502  ?Notification Type Page  ?Notification Reason Other (Comment) ?(BP 77/50- patient resting in bed)  ?Provider response See new orders  ?Date of Provider Response 02/12/22  ?Time of Provider Response [phone]  ?Document  ?Progress note created (see row info) Yes  ?Assess: SIRS CRITERIA  ?SIRS Temperature  0  ?SIRS Pulse 1  ?SIRS Respirations  1  ?SIRS WBC 0  ?SIRS Score Sum  2  ? ? ?

## 2022-02-12 NOTE — Progress Notes (Signed)
0526: Patient refusing to have magnesium given. Attempted to educate patient, but patient continuously saying bye when being spoke to. Patient agreed to have fluid bolus. ?

## 2022-02-12 NOTE — Progress Notes (Addendum)
PHARMACY - PHYSICIAN COMMUNICATION ?CRITICAL VALUE ALERT - BLOOD CULTURE IDENTIFICATION (BCID) ? ?[name] is an 51 y.o. male who presented to [facility] on 02/04/2022 with a chief complaint of sepsis secondary to decubitus ulcer ? ?Assessment:  4/23 Blood cultures with 1/2 GPC. No BCID for these cultures as this is consistent with previous reported value from 4/17 Bcx with GPC (BCID at that time showed Staph hominis) which was thought to be contaminant.  ?- ID saw patient on 4/17 and antibiotics were changed to Augmentin/Doxy to complete a 10 day course ? ?Name of physician (or Provider) Contacted: Dr. Hanley Ben ? ?Current antibiotics: Augmentin/Doxy PO through 4/28 ? ?Changes to prescribed antibiotics recommended: Organism is still likely a contaminant, would continue current antibiotic course. ? ?Results for orders placed or performed during the hospital encounter of 02/04/22  ?Blood Culture ID Panel (Reflexed) (Collected: 02/04/2022  5:10 PM)  ?Result Value Ref Range  ? Enterococcus faecalis NOT DETECTED NOT DETECTED  ? Enterococcus Faecium NOT DETECTED NOT DETECTED  ? Listeria monocytogenes NOT DETECTED NOT DETECTED  ? Staphylococcus species DETECTED (A) NOT DETECTED  ? Staphylococcus aureus (BCID) NOT DETECTED NOT DETECTED  ? Staphylococcus epidermidis NOT DETECTED NOT DETECTED  ? Staphylococcus lugdunensis NOT DETECTED NOT DETECTED  ? Streptococcus species NOT DETECTED NOT DETECTED  ? Streptococcus agalactiae NOT DETECTED NOT DETECTED  ? Streptococcus pneumoniae NOT DETECTED NOT DETECTED  ? Streptococcus pyogenes NOT DETECTED NOT DETECTED  ? A.calcoaceticus-baumannii NOT DETECTED NOT DETECTED  ? Bacteroides fragilis NOT DETECTED NOT DETECTED  ? Enterobacterales NOT DETECTED NOT DETECTED  ? Enterobacter cloacae complex NOT DETECTED NOT DETECTED  ? Escherichia coli NOT DETECTED NOT DETECTED  ? Klebsiella aerogenes NOT DETECTED NOT DETECTED  ? Klebsiella oxytoca NOT DETECTED NOT DETECTED  ? Klebsiella  pneumoniae NOT DETECTED NOT DETECTED  ? Proteus species NOT DETECTED NOT DETECTED  ? Salmonella species NOT DETECTED NOT DETECTED  ? Serratia marcescens NOT DETECTED NOT DETECTED  ? Haemophilus influenzae NOT DETECTED NOT DETECTED  ? Neisseria meningitidis NOT DETECTED NOT DETECTED  ? Pseudomonas aeruginosa NOT DETECTED NOT DETECTED  ? Stenotrophomonas maltophilia NOT DETECTED NOT DETECTED  ? Candida albicans NOT DETECTED NOT DETECTED  ? Candida auris NOT DETECTED NOT DETECTED  ? Candida glabrata NOT DETECTED NOT DETECTED  ? Candida krusei NOT DETECTED NOT DETECTED  ? Candida parapsilosis NOT DETECTED NOT DETECTED  ? Candida tropicalis NOT DETECTED NOT DETECTED  ? Cryptococcus neoformans/gattii NOT DETECTED NOT DETECTED  ? ?Rexford Maus, PharmD ?02/12/2022 8:52 AM ? ?
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
## 2022-02-12 NOTE — Progress Notes (Signed)
Inpatient Diabetes Program Recommendations ? ?AACE/ADA: New Consensus Statement on Inpatient Glycemic Control (2015) ? ?Target Ranges:  Prepandial:   less than 140 mg/dL ?     Peak postprandial:   less than 180 mg/dL (1-2 hours) ?     Critically ill patients:  140 - 180 mg/dL  ? ?Lab Results  ?Component Value Date  ? GLUCAP 79 02/04/2022  ? ? ?Review of Glycemic Control ? Latest Reference Range & Units 02/11/22 03:44 02/12/22 03:57  ?Glucose 70 - 99 mg/dL 62 (L) 60 (L)  ?(L): Data is abnormally low ?Diabetes history: none ?Outpatient Diabetes medications: none ?Current orders for Inpatient glycemic control: none ? ?Inpatient Diabetes Program Recommendations:   ? ?Noted mild hypoglycemia this AM. May want to add CBGs BID.  ? ?Thanks, ?Bronson Curb, MSN, RNC-OB ?Diabetes Coordinator ?[phone] (8a-5p) ? ? ? ? ?
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
## 2022-02-12 NOTE — Care Management Important Message (Signed)
Important Message ? ?Patient Details IM Letter given to the Patient. ?Name: [name] ?MRN: [id number] ?Date of Birth: April 30, 1971 ? ? ?Medicare Important Message Given:  Yes ? ? ? ? ?[name] ?02/12/2022, 1:26 PM ?

## 2022-02-12 NOTE — Progress Notes (Signed)
Physical Therapy Wound Treatment ?Patient Details  ?Name: [name] ?MRN: [id number] ?Date of Birth: 05/21/1971 ? ?Today's Date: 02/12/2022 ?Time: 3762-8315 ?Time Calculation (min): 55 min ? ?Subjective  ?Subjective Assessment ?Subjective: pt more pleasant, calm, and agreeable today ?Patient and Family Stated Goals: go home ?Date of Onset:  (presnt on admission. long standing) ?Prior Treatments: dressings by patient , wound center  ?Pain Score:   ? ?Wound Assessment  ?Pressure Injury 02/06/22 Ischial tuberosity Left Stage 4 - Full thickness tissue loss with exposed bone, tendon or muscle. large and deep wound, muscle and bone exposed. PT ONLY (Active)  ?Wound Image    02/12/22 1200  ?Dressing Type Gauze (Comment);Barrier Film (skin prep);Foam - Lift dressing to assess site every shift;Santyl;Normal saline moist dressing 02/12/22 1200  ?Dressing Changed 02/12/22 1200  ?Dressing Change Frequency PRN 02/12/22 1200  ?State of Healing Non-healing 02/12/22 1200  ?Site / Wound Assessment Pink;Red;Yellow;Brown 02/12/22 1200  ?% Wound base Red or Granulating 65% 02/12/22 1200  ?% Wound base Yellow/Fibrinous Exudate 35% 02/12/22 1200  ?% Wound base Black/Eschar 0% 02/12/22 1200  ?% Wound base Other/Granulation Tissue (Comment) 0% 02/12/22 1200  ?Peri-wound Assessment Intact;Black;Pink 02/12/22 1200  ?Wound Length (cm) 7.1 cm 02/12/22 1200  ?Wound Width (cm) 9.2 cm 02/12/22 1200  ?Wound Depth (cm) 4.4 cm 02/12/22 1200  ?Wound Surface Area (cm^2) 65.32 cm^2 02/12/22 1200  ?Wound Volume (cm^3) 287.41 cm^3 02/12/22 1200  ?Tunneling (cm) 9.1 cm at 12:00 02/12/22 1200  ?Undermining (cm) 2.3 cm from 10:00-12:00 02/12/22 1200  ?Margins Epibole (rolled edges) 02/12/22 1200  ?Drainage Amount Moderate 02/12/22 1200  ?Drainage Description Serosanguineous;Purulent 02/12/22 1200  ?Treatment Debridement (Selective);Cleansed;Hydrotherapy (Pulse lavage);Packing (Saline gauze) 02/12/22 1200  ?   ?Pressure Injury 02/09/22 Hip  Left;Lateral Stage 4 - Full thickness tissue loss with exposed bone, tendon or muscle. Greater Trochanter Wound PT HYDRO Only (Active)  ?Wound Image   02/12/22 1200  ?Dressing Type Foam - Lift dressing to assess site every shift;Gauze (Comment);Santyl;Barrier Film (skin prep);Normal saline moist dressing 02/12/22 1200  ?Dressing Changed 02/12/22 1200  ?Dressing Change Frequency PRN 02/12/22 1200  ?State of Healing Non-healing 02/12/22 1200  ?Site / Wound Assessment Pink;Pale 02/12/22 1200  ?% Wound base Red or Granulating 20% 02/12/22 1200  ?% Wound base Yellow/Fibrinous Exudate 30% 02/12/22 1200  ?% Wound base Black/Eschar 0% 02/12/22 1200  ?% Wound base Other/Granulation Tissue (Comment) 50% 02/12/22 1200  ?Peri-wound Assessment Intact;Maceration;Pink 02/12/22 1200  ?Wound Length (cm) 3 cm 02/12/22 1200  ?Wound Width (cm) 0.9 cm 02/12/22 1200  ?Wound Depth (cm) 1.4 cm 02/12/22 1200  ?Wound Surface Area (cm^2) 2.7 cm^2 02/12/22 1200  ?Wound Volume (cm^3) 3.78 cm^3 02/12/22 1200  ?Tunneling (cm) 4.7 cm at 6:00 02/12/22 1200  ?Undermining (cm) 2.1 cm at 12:00 02/12/22 1200  ?Margins Epibole (rolled edges) 02/12/22 1200  ?Drainage Amount Moderate 02/12/22 1200  ?Drainage Description Serosanguineous 02/12/22 1200  ?Treatment Cleansed;Debridement (Selective);Hydrotherapy (Pulse lavage);Packing (Saline gauze) 02/12/22 1200  ? ?Hydrotherapy ?Pulsed lavage therapy - wound location: Lt ischial and greater trochanter wounds ?Pulsed Lavage with Suction (psi): 8 psi ?Pulsed Lavage with Suction - Normal Saline Used: 1000 mL ?Pulsed Lavage Tip: Tip with splash shield ?Selective Debridement ?Selective Debridement - Location: Lt ischial and greater trochanter wounds ?Selective Debridement - Tools Used: Forceps, Scissors ?Selective Debridement - Tissue Removed: devitalized tissue, eschar, and slough  ? ? ?Wound Assessment and Plan  ?Wound Therapy - Assess/Plan/Recommendations ?Wound Therapy - Clinical Statement: Patient's ischial  wound continues to have reduced  necrotic tissue and able to debride eschar along wound margins with scissors and forceps today. Continued to remove eschar from deeper wound bed. Noted undermining between 10-12 around wound bed where black eschar of superficial skin is present. Anticiapte this may continue to degrade and tissue may slough off. Deep tunnel noted in base of wound running superiorly. Trochanter wound on Lt noted to have reduce red tissue and more pale today with some maceration noted around margins. No purpulent drainiage nored. Pulsed lavage performed to irrigate and remove drainage and loose necrotic tissue in both wounds. Pt tolerated procdure and dressings continued with salin gauze and santyl to necrotic areas for both wounds. Will continue MWF for PT hydrotherapy and dressing changes to be performed by RN on days when hydro not performed. ?Wound Therapy - Functional Problem List: paralysis and amputations of legs ?Factors Delaying/Impairing Wound Healing: Altered sensation, Incontinence, Infection - systemic/local ?Hydrotherapy Plan: Debridement, Dressing change, Patient/family education, Pulsatile lavage with suction ?Wound Therapy - Frequency: 3X / week ?Wound Therapy - Follow Up Recommendations: dressing changes by family/patient (return to wound center) ? ?Wound Therapy Goals- ?Improve the function of patient's integumentary system by progressing the wound(s) through the phases of wound healing (inflammation - proliferation - remodeling) by: ?Wound Therapy Goals - Improve the function of patient's integumentary system by progressing the wound(s) through the phases of wound healing by: ?Decrease Necrotic Tissue to: 10 ?Decrease Necrotic Tissue - Progress: Progressing toward goal ?Increase Granulation Tissue to: 75 ?Increase Granulation Tissue - Progress: Progressing toward goal ?Patient/Family will be able to : dress wound appropriatelyu ?Patient/Family Instruction Goal - Progress: Progressing  toward goal ?Goals/treatment plan/discharge plan were made with and agreed upon by patient/family: Yes ?Time For Goal Achievement: 2 weeks ?Wound Therapy - Potential for Goals: Poor (chronic long standing wounds) ? ?Goals will be updated until maximal potential achieved or discharge criteria met.  Discharge criteria: when goals achieved, discharge from hospital, MD decision/surgical intervention, no progress towards goals, refusal/missing three consecutive treatments without notification or medical reason. ? ?GP ?   ? ?Charges ?PT Wound Care Charges ?$Wound Debridement up to 20 cm: < or equal to 20 cm ?$ Wound Debridement each add'l 20 sqcm: 1 ?$PT Hydrotherapy Dressing: 2 dressings ?$PT PLS Gun and Tip: 1 Supply ?$PT Hydrotherapy Visit: 2 Visits ?  ?  ? ?Gwynneth Albright PT, DPT ?Acute Rehabilitation Services ?Office [phone] ?Pager [phone]  ? ?Jacques Navy ?02/12/2022, 2:58 PM ? ?

## 2022-02-13 DIAGNOSIS — M869 Osteomyelitis, unspecified: Secondary | ICD-10-CM

## 2022-02-13 DIAGNOSIS — L89210 Pressure ulcer of right hip, unstageable: Secondary | ICD-10-CM

## 2022-02-13 DIAGNOSIS — E43 Unspecified severe protein-calorie malnutrition: Secondary | ICD-10-CM

## 2022-02-13 DIAGNOSIS — L89220 Pressure ulcer of left hip, unstageable: Secondary | ICD-10-CM

## 2022-02-13 DIAGNOSIS — M4628 Osteomyelitis of vertebra, sacral and sacrococcygeal region: Secondary | ICD-10-CM | POA: Diagnosis not present

## 2022-02-13 DIAGNOSIS — G40309 Generalized idiopathic epilepsy and epileptic syndromes, not intractable, without status epilepticus: Secondary | ICD-10-CM | POA: Diagnosis not present

## 2022-02-13 DIAGNOSIS — G8250 Quadriplegia, unspecified: Secondary | ICD-10-CM | POA: Diagnosis not present

## 2022-02-13 DIAGNOSIS — S14109S Unspecified injury at unspecified level of cervical spinal cord, sequela: Secondary | ICD-10-CM | POA: Diagnosis not present

## 2022-02-13 LAB — COMPREHENSIVE METABOLIC PANEL
ALT: 8 U/L (ref 0–44)
AST: 15 U/L (ref 15–41)
Albumin: <1.5 g/dL — ABNORMAL LOW (ref 3.5–5.0)
Alkaline Phosphatase: 107 U/L (ref 38–126)
Anion gap: 4 — ABNORMAL LOW (ref 5–15)
BUN: 17 mg/dL (ref 6–20)
CO2: 24 mmol/L (ref 22–32)
Calcium: 8.2 mg/dL — ABNORMAL LOW (ref 8.9–10.3)
Chloride: 106 mmol/L (ref 98–111)
Creatinine, Ser: 0.6 mg/dL — ABNORMAL LOW (ref 0.61–1.24)
GFR, Estimated: >60 mL/min (ref >60)
Glucose, Bld: 61 mg/dL — ABNORMAL LOW (ref 70–99)
Potassium: 4 mmol/L (ref 3.5–5.1)
Sodium: 134 mmol/L — ABNORMAL LOW (ref 135–145)
Total Bilirubin: 0.8 mg/dL (ref 0.3–1.2)
Total Protein: 5.6 g/dL — ABNORMAL LOW (ref 6.5–8.1)

## 2022-02-13 LAB — BPAM RBC
Blood Product Expiration Date: 202305232359
ISSUE DATE / TIME: 202304241756
Unit Type and Rh: 9500

## 2022-02-13 LAB — CBC WITH DIFFERENTIAL/PLATELET
Abs Immature Granulocytes: 0.44 10*3/uL — ABNORMAL HIGH (ref 0.00–0.07)
Basophils Absolute: 0.1 10*3/uL (ref 0.0–0.1)
Basophils Relative: 0 %
Eosinophils Absolute: 0 10*3/uL (ref 0.0–0.5)
Eosinophils Relative: 0 %
HCT: 25.7 % — ABNORMAL LOW (ref 39.0–52.0)
Hemoglobin: 8.7 g/dL — ABNORMAL LOW (ref 13.0–17.0)
Immature Granulocytes: 2 %
Lymphocytes Relative: 12 %
Lymphs Abs: 2.2 10*3/uL (ref 0.7–4.0)
MCH: 30.2 pg (ref 26.0–34.0)
MCHC: 33.9 g/dL (ref 30.0–36.0)
MCV: 89.2 fL (ref 80.0–100.0)
Monocytes Absolute: 2.1 10*3/uL — ABNORMAL HIGH (ref 0.1–1.0)
Monocytes Relative: 11 %
Neutro Abs: 13.5 10*3/uL — ABNORMAL HIGH (ref 1.7–7.7)
Neutrophils Relative %: 75 %
Platelets: 113 10*3/uL — ABNORMAL LOW (ref 150–400)
RBC: 2.88 MIL/uL — ABNORMAL LOW (ref 4.22–5.81)
RDW: 16 % — ABNORMAL HIGH (ref 11.5–15.5)
WBC: 18.3 10*3/uL — ABNORMAL HIGH (ref 4.0–10.5)
nRBC: 0 % (ref 0.0–0.2)

## 2022-02-13 LAB — TYPE AND SCREEN
ABO/RH(D): O NEG
Antibody Screen: NEGATIVE
Unit division: 0

## 2022-02-13 LAB — MAGNESIUM: Magnesium: 1.8 mg/dL (ref 1.7–2.4)

## 2022-02-13 MED ORDER — MIDODRINE HCL 5 MG PO TABS
5.0000 mg | ORAL_TABLET | Freq: Three times a day (TID) | ORAL | Status: DC
  Administered 2022-02-13 – 2022-02-14 (×3): 5 mg via ORAL
  Filled 2022-02-13 (×3): qty 1

## 2022-02-13 MED ORDER — SODIUM CHLORIDE 0.9 % IV SOLN
100.0000 mg | Freq: Two times a day (BID) | INTRAVENOUS | Status: DC
  Administered 2022-02-13 – 2022-02-14 (×3): 100 mg via INTRAVENOUS
  Filled 2022-02-13 (×4): qty 100

## 2022-02-13 MED ORDER — SODIUM CHLORIDE 0.9 % IV SOLN
3.0000 g | Freq: Four times a day (QID) | INTRAVENOUS | Status: DC
  Administered 2022-02-13 – 2022-02-14 (×4): 3 g via INTRAVENOUS
  Filled 2022-02-13 (×6): qty 8
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
## 2022-02-13 NOTE — Progress Notes (Signed)
?   02/12/22 2018  ?Assess: MEWS Score  ?Temp 98.5 ?F (36.9 ?C)  ?BP (!) 79/47  ?Pulse Rate 88  ?Resp 20  ?Level of Consciousness Alert  ?SpO2 100 %  ?O2 Device Room Air  ?Patient Activity (if Appropriate) In bed  ?Assess: MEWS Score  ?MEWS Temp 0  ?MEWS Systolic 2  ?MEWS Pulse 0  ?MEWS RR 0  ?MEWS LOC 0  ?MEWS Score 2  ?MEWS Score Color Yellow  ?Assess: if the MEWS score is Yellow or Red  ?Were vital signs taken at a resting state? Yes  ?Focused Assessment No change from prior assessment  ?Does the patient meet 2 or more of the SIRS criteria? No  ?Does the patient have a confirmed or suspected source of infection? Yes  ?Provider and Rapid Response Notified? Yes  ?MEWS guidelines implemented *See Row Information* Yes  ?Treat  ?MEWS Interventions Administered scheduled meds/treatments  ?Pain Scale 0-10  ?Pain Score 0  ?Take Vital Signs  ?Increase Vital Sign Frequency  Yellow: Q 2hr X 2 then Q 4hr X 2, if remains yellow, continue Q 4hrs  ?Escalate  ?MEWS: Escalate Yellow: discuss with charge nurse/RN and consider discussing with provider and RRT  ?Notify: Charge Nurse/RN  ?Name of Charge Nurse/RN Notified Karrie Doffing, RN  ?Date Charge Nurse/RN Notified 02/12/22  ?Time Charge Nurse/RN Notified 2033  ?Notify: Provider  ?Provider Name/Title Gershon Cull, NP  ?Date Provider Notified 02/12/22  ?Time Provider Notified 2024  ?Notification Type Page  ?Notification Reason Other (Comment) ?(patient's low BP within 30 minutes of Blood Transfusion)  ?Provider response See new orders  ?Date of Provider Response 02/12/22  ?Time of Provider Response 2035  ?Notify: Rapid Response  ?Name of Rapid Response RN Notified Leafy Ro, RN (Rapid Response Nurse)  ?Date Rapid Response Notified 02/12/22  ?Time Rapid Response Notified 2041  ?Document  ?Patient Outcome Other (Comment) ?(BP came up some)  ?Progress note created (see row info) Yes  ?Assess: SIRS CRITERIA  ?SIRS Temperature  0  ?SIRS Pulse 0  ?SIRS Respirations  0  ?SIRS WBC 0  ?SIRS  Score Sum  0  ? ? ?

## 2022-02-13 NOTE — Plan of Care (Signed)

## 2022-02-13 NOTE — Progress Notes (Signed)
Patient refused his ABT, and multivitamin this AM. He states it is too much for him to take all those pills. Explained to pt he need ABT to fight the infection. He was very irritable and said that he know his body. MD notified. ?

## 2022-02-13 NOTE — Plan of Care (Signed)
  Problem: Pain Managment: Goal: General experience of comfort will improve Outcome: Progressing   Problem: Elimination: Goal: Will not experience complications related to bowel motility Outcome: Progressing Goal: Will not experience complications related to urinary retention Outcome: Progressing   

## 2022-02-13 NOTE — TOC Progression Note (Addendum)
Transition of Care (TOC) - Progression Note  ? ? ?Patient Details  ?Name: Glenn Gullickson ?MRN: [id number] ?Date of Birth: 08/18/1971 ? ?Transition of Care (TOC) CM/SW Contact  ?Lanier Clam, RN ?Phone Number: ?02/13/2022, 12:50 PM ? ?Clinical Narrative: [facility] rep Cheryl following for HHRN(iv abx if needed, wound care only dsg changes teaching)/HHPT;[facility] rep Pam following if Long term iv abx needed. Wait specific HHC;OPAT orders if needed for home.PTAR for transport.   ?-1:42p-[facility] for HHRN/PT;no home iv abx per MD;wound care clinic appt set.Family will transport home on own. ? ? ? ?Expected Discharge Plan: Home w Home Health Services ?Barriers to Discharge: Continued Medical Work up ? ?Expected Discharge Plan and Services ?Expected Discharge Plan: Home w Home Health Services ?  ?  ?  ?Living arrangements for the past 2 months: Single Family Home ?                ?  ?  ?  ?  ?  ?  ?  ?  ?  ?  ? ? ?Social Determinants of Health (SDOH) Interventions ?  ? ?Readmission Risk Interventions ?   ? View : No data to display.  ?  ?  ?  ? ? ?

## 2022-02-13 NOTE — Progress Notes (Signed)
?   02/12/22 2249  ?Provider Notification  ?Provider Name/Title Chinita Greenland, NP  ?Date Provider Notified 02/12/22  ?Time Provider Notified 2258  ?Method of Notification Page  ?Notification Reason Red med refusal  ?Provider response No new orders  ?Date of Provider Response 02/12/22  ?Time of Provider Response 2257  ? ?Patient refused scheduled amoxicillin-clavulanate. Notified on call provider. ?
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
## 2022-02-13 NOTE — Progress Notes (Signed)
PT Cancellation Note ? ?Patient Details ?Name: [name] ?MRN: [id number] ?DOB: Mar 08, 1971 ? ? ?Cancelled Treatment:    Reason Eval/Treat Not Completed: Patient declined, no reason specified ("we'll do it tomorrow". will follow up at later date/time as schedule allows.) ? ? ?Gwynneth Albright PT, DPT ?Acute Rehabilitation Services ?Office [phone] ?Pager [phone]  ?02/13/2022, 11:22 AM ?

## 2022-02-14 DIAGNOSIS — A419 Sepsis, unspecified organism: Secondary | ICD-10-CM | POA: Diagnosis not present

## 2022-02-14 DIAGNOSIS — M869 Osteomyelitis, unspecified: Secondary | ICD-10-CM | POA: Diagnosis not present

## 2022-02-14 DIAGNOSIS — Z720 Tobacco use: Secondary | ICD-10-CM

## 2022-02-14 DIAGNOSIS — R7881 Bacteremia: Secondary | ICD-10-CM

## 2022-02-14 DIAGNOSIS — G9341 Metabolic encephalopathy: Secondary | ICD-10-CM | POA: Diagnosis not present

## 2022-02-14 DIAGNOSIS — S14109S Unspecified injury at unspecified level of cervical spinal cord, sequela: Secondary | ICD-10-CM | POA: Diagnosis not present

## 2022-02-14 LAB — CULTURE, BLOOD (ROUTINE X 2): Special Requests: ADEQUATE

## 2022-02-14 MED ORDER — ZINC SULFATE 220 (50 ZN) MG PO CAPS: 220.0000 mg | ORAL_CAPSULE | Freq: Every day | ORAL | Status: DC

## 2022-02-14 MED ORDER — ADULT MULTIVITAMIN W/MINERALS CH: 1.0000 | ORAL_TABLET | Freq: Every day | ORAL | 0 refills | Status: AC

## 2022-02-14 MED ORDER — MIDODRINE HCL 5 MG PO TABS
5.0000 mg | ORAL_TABLET | Freq: Three times a day (TID) | ORAL | 0 refills | Status: AC
Start: 2022-02-14 — End: ?

## 2022-02-14 MED ORDER — ASCORBIC ACID 500 MG PO TABS: 500.0000 mg | ORAL_TABLET | Freq: Two times a day (BID) | ORAL | Status: DC

## 2022-02-14 MED ORDER — ENSURE ENLIVE PO LIQD
237.0000 mL | Freq: Two times a day (BID) | ORAL | 0 refills | Status: AC
Start: 2022-02-14 — End: 2022-03-16

## 2022-02-14 MED ORDER — ASCORBIC ACID 500 MG PO TABS
500.0000 mg | ORAL_TABLET | Freq: Two times a day (BID) | ORAL | 1 refills | Status: AC
Start: 2022-02-14 — End: ?

## 2022-02-14 MED ORDER — DOXYCYCLINE HYCLATE 100 MG PO CAPS: 100.0000 mg | ORAL_CAPSULE | Freq: Two times a day (BID) | ORAL | 0 refills | Status: AC

## 2022-02-14 MED ORDER — AMOXICILLIN-POT CLAVULANATE 875-125 MG PO TABS: 1.0000 | ORAL_TABLET | Freq: Two times a day (BID) | ORAL | 0 refills | Status: AC

## 2022-02-14 MED ORDER — SENNOSIDES-DOCUSATE SODIUM 8.6-50 MG PO TABS: 1.0000 | ORAL_TABLET | Freq: Two times a day (BID) | ORAL | 0 refills | Status: AC | PRN

## 2022-02-14 NOTE — Assessment & Plan Note (Signed)
See above

## 2022-02-14 NOTE — TOC Transition Note (Addendum)
Transition of Care (TOC) - CM/SW Discharge Note ? ? ?Patient Details  ?Name: [name] ?MRN: [id number] ?Date of Birth: 04-01-1971 ? ?Transition of Care [facility]) CM/SW Contact:  ?Lanier Clam, RN ?Phone Number: ?02/14/2022, 1:28 PM ? ? ?Clinical Narrative:  d/c home today w/[facility] HHRN-wound care orders in place;Wound care center  to sign HHC orders after d/c;Family to transport on own. No further CM needs.  ?-2:25p-spoke to Joann(mother) about d/c plans-HHC,wound care appt-[facility] to teach her to do wound care(in agreement). Family to transport home. No further CM needs. ? ? ? ?Final next level of care: Home w Home Health Services ?Barriers to Discharge: No Barriers Identified ? ? ?Patient Goals and CMS Choice ?  ?  ?  ? ?Discharge Placement ?  ?           ?  ?  ?  ?  ? ?Discharge Plan and Services ?  ?Discharge Planning Services: CM Consult ?           ?  ?  ?  ?  ?  ?HH Arranged: Charity fundraiser, PT ?HH Agency: [facility] [facility] ?Date HH Agency Contacted: 02/14/22 ?Time HH Agency Contacted: 1328 ?Representative spoke with at [facility] Agency: Elnita Maxwell ? ?Social Determinants of Health (SDOH) Interventions ?  ? ? ?Readmission Risk Interventions ?   ? View : No data to display.  ?  ?  ?  ? ? ? ? ? ?

## 2022-02-14 NOTE — Assessment & Plan Note (Addendum)
Blood cultures with staph hominis and Staph epidermidis in 1 out of 2 bottles on 4/16 and 4/23 respectively.  Likely contaminant.  Not true bacteremia.  No indication for treatment per ID ?

## 2022-02-14 NOTE — Assessment & Plan Note (Signed)
Uses wheelchair at baseline. ?PT/OT ?

## 2022-02-14 NOTE — Progress Notes (Signed)
AVS and discharge instructions reviewed w/ patient and 2 family members at the bedside. Wound care instructions given to and taught to the family at the bedside. Extra supplies for wound care supplied to patient's family until [facility] wound care follows up w/ patient at home. Family and patient verbalized understanding and had no further questions. ?

## 2022-02-14 NOTE — Progress Notes (Signed)
[facility]ST RN to bedside to remove PICC for discharge. Debridement therapy in progress. Will return at later time.  ?

## 2022-02-14 NOTE — Assessment & Plan Note (Signed)
Daily dressing per WOCN recommendation.  Patient's mother educated.  [facility] RN ordered.  Patient has follow-up at wound care on 5/3. ?

## 2022-02-14 NOTE — Progress Notes (Signed)
DBIV: Pt declined lab draw at this time.  ?

## 2022-02-14 NOTE — Progress Notes (Signed)
[facility]ST RN contacted Cyndia Skeeters, MD regarding ordered lab draw from this am; he stated to disregard labs ordered this am as patient previously refused.  ?RA SL PICC removed per protocol per MD order. Manual pressure applied for 5 mins. Vaseline gauze, gauze, and Tegaderm applied over insertion site. No bleeding or swelling noted. Educated patient about S/S of infection and when to call MD; keep dressing dry and intact for 24 hours. Pt verbalized comprehension.  ?

## 2022-02-14 NOTE — Progress Notes (Signed)
Physical Therapy Wound Treatment ?Patient Details  ?Name: [name] ?MRN: [id number] ?Date of Birth: 1970/11/27 ? ?Today's Date: 02/14/2022 ?Time: 1120-1210 ?Time Calculation (min): 50 min ? ?Subjective  ?Subjective Assessment ?Subjective: limited talking today, pt slightly irritable but followed cues to roll for wound care. ?Patient and Family Stated Goals: go home ?Date of Onset:  (presnt on admission. long standing) ?Prior Treatments: dressings by patient , wound center  ?Pain Score:   ? ?Wound Assessment  ?Pressure Injury 02/06/22 Ischial tuberosity Left Stage 4 - Full thickness tissue loss with exposed bone, tendon or muscle. large and deep wound, muscle and bone exposed. PT ONLY (Active)  ?Wound Image    02/12/22 1200  ?Dressing Type Gauze (Comment);Barrier Film (skin prep);Foam - Lift dressing to assess site every shift;Santyl;Normal saline moist dressing 02/14/22 1225  ?Dressing Changed 02/14/22 1225  ?Dressing Change Frequency PRN 02/14/22 1225  ?State of Healing Non-healing 02/14/22 1225  ?Site / Wound Assessment Pink;Red;Yellow;Brown 02/14/22 1225  ?% Wound base Red or Granulating 65% 02/14/22 1225  ?% Wound base Yellow/Fibrinous Exudate 35% 02/14/22 1225  ?% Wound base Black/Eschar 0% 02/14/22 1225  ?% Wound base Other/Granulation Tissue (Comment) 0% 02/14/22 1225  ?Peri-wound Assessment Intact;Black;Pink 02/14/22 1225  ?Wound Length (cm) 7.1 cm 02/14/22 1000  ?Wound Width (cm) 9.2 cm 02/14/22 1000  ?Wound Depth (cm) 4.4 cm 02/14/22 1000  ?Wound Surface Area (cm^2) 65.32 cm^2 02/14/22 1000  ?Wound Volume (cm^3) 287.41 cm^3 02/14/22 1000  ?Tunneling (cm) 9.1 02/14/22 1000  ?Undermining (cm) 2.3 02/14/22 1000  ?Margins Epibole (rolled edges) 02/14/22 1225  ?Drainage Amount Moderate 02/14/22 1225  ?Drainage Description Serosanguineous;Purulent;Odor - foul 02/14/22 1225  ?Treatment Cleansed;Debridement (Selective);Hydrotherapy (Pulse lavage);Packing (Saline gauze) 02/14/22 1225  ?   ?Pressure Injury  02/09/22 Hip Left;Lateral Stage 4 - Full thickness tissue loss with exposed bone, tendon or muscle. Greater Trochanter Wound PT HYDRO Only (Active)  ?Wound Image   02/12/22 1200  ?Dressing Type Foam - Lift dressing to assess site every shift;Gauze (Comment);Santyl;Barrier Film (skin prep);Normal saline moist dressing 02/14/22 1225  ?Dressing Changed 02/14/22 1225  ?Dressing Change Frequency PRN 02/14/22 1225  ?State of Healing Non-healing 02/14/22 1225  ?Site / Wound Assessment Pink;Pale 02/14/22 1225  ?% Wound base Red or Granulating 60% 02/14/22 1225  ?% Wound base Yellow/Fibrinous Exudate 30% 02/14/22 1225  ?% Wound base Black/Eschar 0% 02/14/22 1225  ?% Wound base Other/Granulation Tissue (Comment) 10% 02/14/22 1225  ?Peri-wound Assessment Intact;Maceration;Pink 02/14/22 1225  ?Wound Length (cm) 3 cm 02/14/22 1000  ?Wound Width (cm) 0.9 cm 02/14/22 1000  ?Wound Depth (cm) 1.4 cm 02/14/22 1000  ?Wound Surface Area (cm^2) 2.7 cm^2 02/14/22 1000  ?Wound Volume (cm^3) 3.78 cm^3 02/14/22 1000  ?Tunneling (cm) 4.7 02/14/22 1000  ?Undermining (cm) 2.1 02/14/22 1000  ?Margins Epibole (rolled edges) 02/14/22 1225  ?Drainage Amount Moderate 02/14/22 1225  ?Drainage Description Serosanguineous 02/14/22 1225  ?Treatment Cleansed;Debridement (Selective);Hydrotherapy (Pulse lavage);Packing (Saline gauze) 02/14/22 1225  ? ?Hydrotherapy ?Pulsed lavage therapy - wound location: Lt ischial and greater trochanter wounds ?Pulsed Lavage with Suction (psi): 8 psi ?Pulsed Lavage with Suction - Normal Saline Used: 1000 mL ?Pulsed Lavage Tip: Tip with splash shield ?Selective Debridement ?Selective Debridement - Location: Lt ischial and greater trochanter wounds ?Selective Debridement - Tools Used: Forceps, Scissors ?Selective Debridement - Tissue Removed: devitalized tissue, eschar, and slough  ? ? ?Wound Assessment and Plan  ?Wound Therapy - Assess/Plan/Recommendations ?Wound Therapy - Clinical Statement: Continued with hydrotherapy  for Lt trochanter and Ischial/sacral wound. Wound  had slightly more purulent drainage today from ischial wound. Was able to debdride more necrotic slough from deep in wound bed today and packed wound with santyle and saline gauze. Trochanter wound was packed as able, probbed with cotton swap but unable to pack to prior depth noted for tunneling. Will continue MWF for PT hydrotherapy and dressing changes to be performed by RN on days when hydro not performed. ?Wound Therapy - Functional Problem List: paralysis and amputations of legs ?Factors Delaying/Impairing Wound Healing: Altered sensation, Incontinence, Infection - systemic/local ?Hydrotherapy Plan: Debridement, Dressing change, Patient/family education, Pulsatile lavage with suction ?Wound Therapy - Frequency: 3X / week ?Wound Therapy - Follow Up Recommendations: dressing changes by family/patient (return to wound center) ? ?Wound Therapy Goals- ?Improve the function of patient's integumentary system by progressing the wound(s) through the phases of wound healing (inflammation - proliferation - remodeling) by: ?Wound Therapy Goals - Improve the function of patient's integumentary system by progressing the wound(s) through the phases of wound healing by: ?Decrease Necrotic Tissue to: 10 ?Decrease Necrotic Tissue - Progress: Progressing toward goal ?Increase Granulation Tissue to: 75 ?Increase Granulation Tissue - Progress: Progressing toward goal ?Patient/Family will be able to : dress wound appropriatelyu ?Patient/Family Instruction Goal - Progress: Progressing toward goal ?Goals/treatment plan/discharge plan were made with and agreed upon by patient/family: Yes ?Time For Goal Achievement: 2 weeks ?Wound Therapy - Potential for Goals: Poor (chronic long standing wounds) ? ?Goals will be updated until maximal potential achieved or discharge criteria met.  Discharge criteria: when goals achieved, discharge from hospital, MD decision/surgical intervention, no  progress towards goals, refusal/missing three consecutive treatments without notification or medical reason. ? ?GP ?   ? ?Charges ?PT Wound Care Charges ?$Wound Debridement up to 20 cm: < or equal to 20 cm ?$ Wound Debridement each add'l 20 sqcm: 1 ?$PT Hydrotherapy Dressing: 1 dressing ?$PT PLS Gun and Tip: 1 Supply ?$PT Hydrotherapy Visit: 2 Visits ?  ?  ? ?Gwynneth Albright PT, DPT ?Acute Rehabilitation Services ?Office [phone] ?Pager [phone]  ? ? ?Jacques Navy ?02/14/2022, 12:55 PM ? ?
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
## 2022-02-14 NOTE — Progress Notes (Signed)
NUTRITION NOTE ? ?Patient seen by this RD for a full assessment on 4/24. Discussed with MD via secure chat about adding 500 mg ascorbic acid BID and 220 mg zinc sulfate once/day x14 days. MD agreeable. Orders placed. ? ?RD will continue to follow per protocol.  ? ? ? ? ?Trenton Gammon, MS, RD, LDN ?Registered Dietitian II ?Inpatient Clinical Nutrition ?RD pager # and on-call/weekend pager # available in AMION  ? ?

## 2022-02-14 NOTE — Plan of Care (Signed)
  Problem: Elimination: Goal: Will not experience complications related to bowel motility Outcome: Progressing Goal: Will not experience complications related to urinary retention Outcome: Progressing   

## 2022-02-14 NOTE — Assessment & Plan Note (Signed)
Nutrition Status: ?Nutrition Problem: Moderate Malnutrition ?Etiology: chronic illness ?Signs/Symptoms: moderate fat depletion, moderate muscle depletion ?Interventions: Ensure Enlive (each supplement provides 350kcal and 20 grams of protein), Juven, MVI ?

## 2022-02-16 ENCOUNTER — Emergency Department (HOSPITAL_COMMUNITY): Payer: Medicare Other

## 2022-02-16 ENCOUNTER — Inpatient Hospital Stay (HOSPITAL_COMMUNITY)
Admission: EM | Admit: 2022-02-16 | Discharge: 2022-03-22 | DRG: 871 | Disposition: E | Payer: Medicare Other | Attending: Internal Medicine | Admitting: Internal Medicine

## 2022-02-16 ENCOUNTER — Other Ambulatory Visit: Payer: Self-pay

## 2022-02-16 ENCOUNTER — Encounter (HOSPITAL_COMMUNITY): Payer: Self-pay | Admitting: Pulmonary Disease

## 2022-02-16 DIAGNOSIS — Z66 Do not resuscitate: Secondary | ICD-10-CM | POA: Diagnosis not present

## 2022-02-16 DIAGNOSIS — F05 Delirium due to known physiological condition: Secondary | ICD-10-CM | POA: Diagnosis present

## 2022-02-16 DIAGNOSIS — E871 Hypo-osmolality and hyponatremia: Secondary | ICD-10-CM | POA: Diagnosis present

## 2022-02-16 DIAGNOSIS — E878 Other disorders of electrolyte and fluid balance, not elsewhere classified: Secondary | ICD-10-CM

## 2022-02-16 DIAGNOSIS — G9341 Metabolic encephalopathy: Secondary | ICD-10-CM | POA: Diagnosis present

## 2022-02-16 DIAGNOSIS — Z8249 Family history of ischemic heart disease and other diseases of the circulatory system: Secondary | ICD-10-CM

## 2022-02-16 DIAGNOSIS — G40109 Localization-related (focal) (partial) symptomatic epilepsy and epileptic syndromes with simple partial seizures, not intractable, without status epilepticus: Secondary | ICD-10-CM | POA: Diagnosis present

## 2022-02-16 DIAGNOSIS — Z89612 Acquired absence of left leg above knee: Secondary | ICD-10-CM

## 2022-02-16 DIAGNOSIS — Z993 Dependence on wheelchair: Secondary | ICD-10-CM

## 2022-02-16 DIAGNOSIS — S14109S Unspecified injury at unspecified level of cervical spinal cord, sequela: Secondary | ICD-10-CM | POA: Diagnosis not present

## 2022-02-16 DIAGNOSIS — Z87820 Personal history of traumatic brain injury: Secondary | ICD-10-CM

## 2022-02-16 DIAGNOSIS — G8220 Paraplegia, unspecified: Secondary | ICD-10-CM | POA: Diagnosis present

## 2022-02-16 DIAGNOSIS — N319 Neuromuscular dysfunction of bladder, unspecified: Secondary | ICD-10-CM | POA: Diagnosis present

## 2022-02-16 DIAGNOSIS — S069XAS Unspecified intracranial injury with loss of consciousness status unknown, sequela: Secondary | ICD-10-CM

## 2022-02-16 DIAGNOSIS — J9621 Acute and chronic respiratory failure with hypoxia: Secondary | ICD-10-CM | POA: Diagnosis not present

## 2022-02-16 DIAGNOSIS — R4182 Altered mental status, unspecified: Secondary | ICD-10-CM | POA: Diagnosis present

## 2022-02-16 DIAGNOSIS — G9389 Other specified disorders of brain: Secondary | ICD-10-CM | POA: Diagnosis present

## 2022-02-16 DIAGNOSIS — R1310 Dysphagia, unspecified: Secondary | ICD-10-CM | POA: Diagnosis present

## 2022-02-16 DIAGNOSIS — J9601 Acute respiratory failure with hypoxia: Secondary | ICD-10-CM

## 2022-02-16 DIAGNOSIS — E861 Hypovolemia: Secondary | ICD-10-CM | POA: Diagnosis present

## 2022-02-16 DIAGNOSIS — Z87898 Personal history of other specified conditions: Secondary | ICD-10-CM | POA: Diagnosis not present

## 2022-02-16 DIAGNOSIS — S14105S Unspecified injury at C5 level of cervical spinal cord, sequela: Secondary | ICD-10-CM

## 2022-02-16 DIAGNOSIS — N289 Disorder of kidney and ureter, unspecified: Secondary | ICD-10-CM

## 2022-02-16 DIAGNOSIS — Z515 Encounter for palliative care: Secondary | ICD-10-CM | POA: Diagnosis not present

## 2022-02-16 DIAGNOSIS — L89312 Pressure ulcer of right buttock, stage 2: Secondary | ICD-10-CM | POA: Diagnosis present

## 2022-02-16 DIAGNOSIS — S14109A Unspecified injury at unspecified level of cervical spinal cord, initial encounter: Secondary | ICD-10-CM | POA: Diagnosis present

## 2022-02-16 DIAGNOSIS — Z888 Allergy status to other drugs, medicaments and biological substances status: Secondary | ICD-10-CM

## 2022-02-16 DIAGNOSIS — F17210 Nicotine dependence, cigarettes, uncomplicated: Secondary | ICD-10-CM | POA: Diagnosis present

## 2022-02-16 DIAGNOSIS — E876 Hypokalemia: Secondary | ICD-10-CM | POA: Diagnosis present

## 2022-02-16 DIAGNOSIS — M86159 Other acute osteomyelitis, unspecified femur: Secondary | ICD-10-CM | POA: Diagnosis not present

## 2022-02-16 DIAGNOSIS — R54 Age-related physical debility: Secondary | ICD-10-CM | POA: Diagnosis present

## 2022-02-16 DIAGNOSIS — J690 Pneumonitis due to inhalation of food and vomit: Secondary | ICD-10-CM | POA: Diagnosis present

## 2022-02-16 DIAGNOSIS — E8809 Other disorders of plasma-protein metabolism, not elsewhere classified: Secondary | ICD-10-CM | POA: Diagnosis present

## 2022-02-16 DIAGNOSIS — M869 Osteomyelitis, unspecified: Secondary | ICD-10-CM

## 2022-02-16 DIAGNOSIS — R6521 Severe sepsis with septic shock: Secondary | ICD-10-CM | POA: Diagnosis present

## 2022-02-16 DIAGNOSIS — I82611 Acute embolism and thrombosis of superficial veins of right upper extremity: Secondary | ICD-10-CM | POA: Diagnosis not present

## 2022-02-16 DIAGNOSIS — A419 Sepsis, unspecified organism: Principal | ICD-10-CM | POA: Diagnosis present

## 2022-02-16 DIAGNOSIS — N170 Acute kidney failure with tubular necrosis: Secondary | ICD-10-CM | POA: Diagnosis not present

## 2022-02-16 DIAGNOSIS — R569 Unspecified convulsions: Secondary | ICD-10-CM | POA: Diagnosis not present

## 2022-02-16 DIAGNOSIS — Z89511 Acquired absence of right leg below knee: Secondary | ICD-10-CM | POA: Diagnosis not present

## 2022-02-16 DIAGNOSIS — R64 Cachexia: Secondary | ICD-10-CM | POA: Diagnosis present

## 2022-02-16 DIAGNOSIS — M7989 Other specified soft tissue disorders: Secondary | ICD-10-CM | POA: Diagnosis not present

## 2022-02-16 DIAGNOSIS — R197 Diarrhea, unspecified: Secondary | ICD-10-CM | POA: Diagnosis present

## 2022-02-16 DIAGNOSIS — M4628 Osteomyelitis of vertebra, sacral and sacrococcygeal region: Secondary | ICD-10-CM | POA: Diagnosis present

## 2022-02-16 DIAGNOSIS — L89154 Pressure ulcer of sacral region, stage 4: Secondary | ICD-10-CM | POA: Diagnosis present

## 2022-02-16 DIAGNOSIS — E8770 Fluid overload, unspecified: Secondary | ICD-10-CM | POA: Diagnosis not present

## 2022-02-16 DIAGNOSIS — L89209 Pressure ulcer of unspecified hip, unspecified stage: Secondary | ICD-10-CM | POA: Diagnosis present

## 2022-02-16 DIAGNOSIS — D638 Anemia in other chronic diseases classified elsewhere: Secondary | ICD-10-CM | POA: Diagnosis present

## 2022-02-16 DIAGNOSIS — G40919 Epilepsy, unspecified, intractable, without status epilepticus: Secondary | ICD-10-CM | POA: Diagnosis not present

## 2022-02-16 DIAGNOSIS — G9340 Encephalopathy, unspecified: Secondary | ICD-10-CM

## 2022-02-16 DIAGNOSIS — E43 Unspecified severe protein-calorie malnutrition: Secondary | ICD-10-CM | POA: Diagnosis present

## 2022-02-16 DIAGNOSIS — F0392 Unspecified dementia, unspecified severity, with psychotic disturbance: Secondary | ICD-10-CM | POA: Diagnosis present

## 2022-02-16 DIAGNOSIS — E162 Hypoglycemia, unspecified: Secondary | ICD-10-CM | POA: Diagnosis present

## 2022-02-16 DIAGNOSIS — Z79899 Other long term (current) drug therapy: Secondary | ICD-10-CM

## 2022-02-16 DIAGNOSIS — D509 Iron deficiency anemia, unspecified: Secondary | ICD-10-CM | POA: Diagnosis present

## 2022-02-16 DIAGNOSIS — Z6824 Body mass index (BMI) 24.0-24.9, adult: Secondary | ICD-10-CM

## 2022-02-16 DIAGNOSIS — Z89512 Acquired absence of left leg below knee: Secondary | ICD-10-CM

## 2022-02-16 DIAGNOSIS — R571 Hypovolemic shock: Secondary | ICD-10-CM | POA: Diagnosis present

## 2022-02-16 DIAGNOSIS — F0280 Dementia in other diseases classified elsewhere without behavioral disturbance: Secondary | ICD-10-CM

## 2022-02-16 DIAGNOSIS — R579 Shock, unspecified: Secondary | ICD-10-CM | POA: Diagnosis not present

## 2022-02-16 LAB — CBC WITH DIFFERENTIAL/PLATELET
Abs Immature Granulocytes: 0.08 10*3/uL — ABNORMAL HIGH (ref 0.00–0.07)
Basophils Absolute: 0 10*3/uL (ref 0.0–0.1)
Basophils Relative: 0 %
Eosinophils Absolute: 0 10*3/uL (ref 0.0–0.5)
Eosinophils Relative: 0 %
HCT: 26.5 % — ABNORMAL LOW (ref 39.0–52.0)
Hemoglobin: 8.5 g/dL — ABNORMAL LOW (ref 13.0–17.0)
Immature Granulocytes: 1 %
Lymphocytes Relative: 16 %
Lymphs Abs: 1.6 10*3/uL (ref 0.7–4.0)
MCH: 30.5 pg (ref 26.0–34.0)
MCHC: 32.1 g/dL (ref 30.0–36.0)
MCV: 95 fL (ref 80.0–100.0)
Monocytes Absolute: 1.5 10*3/uL — ABNORMAL HIGH (ref 0.1–1.0)
Monocytes Relative: 14 %
Neutro Abs: 7.3 10*3/uL (ref 1.7–7.7)
Neutrophils Relative %: 69 %
Platelets: 199 10*3/uL (ref 150–400)
RBC: 2.79 MIL/uL — ABNORMAL LOW (ref 4.22–5.81)
RDW: 16.2 % — ABNORMAL HIGH (ref 11.5–15.5)
WBC: 10.5 10*3/uL (ref 4.0–10.5)
nRBC: 0 % (ref 0.0–0.2)

## 2022-02-16 LAB — GLUCOSE, CAPILLARY: Glucose-Capillary: 53 mg/dL — ABNORMAL LOW (ref 70–99)

## 2022-02-16 LAB — URINALYSIS, ROUTINE W REFLEX MICROSCOPIC
Bilirubin Urine: NEGATIVE
Glucose, UA: NEGATIVE mg/dL
Hgb urine dipstick: NEGATIVE
Ketones, ur: 5 mg/dL — AB
Nitrite: NEGATIVE
Protein, ur: NEGATIVE mg/dL
Specific Gravity, Urine: 1.015 (ref 1.005–1.030)
pH: 5 (ref 5.0–8.0)

## 2022-02-16 LAB — COMPREHENSIVE METABOLIC PANEL
ALT: 10 U/L (ref 0–44)
AST: 26 U/L (ref 15–41)
Albumin: <1.5 g/dL — ABNORMAL LOW (ref 3.5–5.0)
Alkaline Phosphatase: 120 U/L (ref 38–126)
Anion gap: 8 (ref 5–15)
BUN: 20 mg/dL (ref 6–20)
CO2: 22 mmol/L (ref 22–32)
Calcium: 8.5 mg/dL — ABNORMAL LOW (ref 8.9–10.3)
Chloride: 101 mmol/L (ref 98–111)
Creatinine, Ser: 0.53 mg/dL — ABNORMAL LOW (ref 0.61–1.24)
GFR, Estimated: >60 mL/min (ref >60)
Glucose, Bld: 42 mg/dL — CL (ref 70–99)
Potassium: 3.4 mmol/L — ABNORMAL LOW (ref 3.5–5.1)
Sodium: 131 mmol/L — ABNORMAL LOW (ref 135–145)
Total Bilirubin: 0.8 mg/dL (ref 0.3–1.2)
Total Protein: 6.9 g/dL (ref 6.5–8.1)

## 2022-02-16 LAB — I-STAT CHEM 8, ED
BUN: 22 mg/dL — ABNORMAL HIGH (ref 6–20)
Calcium, Ion: 1.16 mmol/L (ref 1.15–1.40)
Chloride: 99 mmol/L (ref 98–111)
Creatinine, Ser: 0.6 mg/dL — ABNORMAL LOW (ref 0.61–1.24)
Glucose, Bld: 43 mg/dL — CL (ref 70–99)
HCT: 26 % — ABNORMAL LOW (ref 39.0–52.0)
Hemoglobin: 8.8 g/dL — ABNORMAL LOW (ref 13.0–17.0)
Potassium: 3.7 mmol/L (ref 3.5–5.1)
Sodium: 134 mmol/L — ABNORMAL LOW (ref 135–145)
TCO2: 26 mmol/L (ref 22–32)

## 2022-02-16 LAB — LACTIC ACID, PLASMA
Lactic Acid, Venous: 1.5 mmol/L (ref 0.5–1.9)
Lactic Acid, Venous: 4.1 mmol/L (ref 0.5–1.9)

## 2022-02-16 LAB — VALPROIC ACID LEVEL: Valproic Acid Lvl: 52 ug/mL (ref 50.0–100.0)

## 2022-02-16 LAB — BRAIN NATRIURETIC PEPTIDE: B Natriuretic Peptide: 298.9 pg/mL — ABNORMAL HIGH (ref 0.0–100.0)

## 2022-02-16 LAB — PROTIME-INR
INR: 1.4 — ABNORMAL HIGH (ref 0.8–1.2)
Prothrombin Time: 16.7 seconds — ABNORMAL HIGH (ref 11.4–15.2)

## 2022-02-16 LAB — APTT: aPTT: 20 seconds — ABNORMAL LOW (ref 24–36)

## 2022-02-16 LAB — CULTURE, BLOOD (ROUTINE X 2)
Culture: NO GROWTH
Special Requests: ADEQUATE

## 2022-02-16 LAB — CBG MONITORING, ED
Glucose-Capillary: 47 mg/dL — ABNORMAL LOW (ref 70–99)
Glucose-Capillary: 92 mg/dL (ref 70–99)

## 2022-02-16 LAB — MRSA NEXT GEN BY PCR, NASAL: MRSA by PCR Next Gen: NOT DETECTED

## 2022-02-16 MED ORDER — POTASSIUM CHLORIDE 10 MEQ/100ML IV SOLN
10.0000 meq | INTRAVENOUS | Status: AC
  Filled 2022-02-16: qty 100

## 2022-02-16 MED ORDER — LACTATED RINGERS IV BOLUS
1000.0000 mL | Freq: Once | INTRAVENOUS | Status: AC
  Administered 2022-02-16: 1000 mL via INTRAVENOUS

## 2022-02-16 MED ORDER — SODIUM CHLORIDE (PF) 0.9 % IJ SOLN
INTRAMUSCULAR | Status: AC
  Administered 2022-02-16: 10 mL
  Filled 2022-02-16: qty 50

## 2022-02-16 MED ORDER — VANCOMYCIN HCL IN DEXTROSE 1-5 GM/200ML-% IV SOLN
1000.0000 mg | Freq: Once | INTRAVENOUS | Status: DC
  Filled 2022-02-16: qty 200

## 2022-02-16 MED ORDER — VANCOMYCIN HCL IN DEXTROSE 1-5 GM/200ML-% IV SOLN
1000.0000 mg | Freq: Two times a day (BID) | INTRAVENOUS | Status: DC
  Administered 2022-02-17 – 2022-02-19 (×4): 1000 mg via INTRAVENOUS
  Filled 2022-02-16 (×4): qty 200

## 2022-02-16 MED ORDER — SODIUM CHLORIDE 0.9 % IV SOLN
2.0000 g | INTRAVENOUS | Status: AC
  Administered 2022-02-16: 2 g via INTRAVENOUS
  Filled 2022-02-16: qty 12.5

## 2022-02-16 MED ORDER — CHLORHEXIDINE GLUCONATE CLOTH 2 % EX PADS
6.0000 | MEDICATED_PAD | Freq: Every day | CUTANEOUS | Status: DC
  Administered 2022-02-16 – 2022-03-02 (×15): 6 via TOPICAL

## 2022-02-16 MED ORDER — SODIUM CHLORIDE 0.9 % IV SOLN
2.0000 g | Freq: Three times a day (TID) | INTRAVENOUS | Status: DC
  Administered 2022-02-17 – 2022-02-20 (×9): 2 g via INTRAVENOUS
  Filled 2022-02-16 (×9): qty 12.5

## 2022-02-16 MED ORDER — DEXTROSE IN LACTATED RINGERS 5 % IV SOLN: INTRAVENOUS | Status: DC

## 2022-02-16 MED ORDER — LACTATED RINGERS IV BOLUS: 1000.0000 mL | Freq: Once | INTRAVENOUS | Status: DC

## 2022-02-16 MED ORDER — SODIUM CHLORIDE 0.9 % IV SOLN
2.0000 g | Freq: Once | INTRAVENOUS | Status: DC
  Filled 2022-02-16: qty 12.5

## 2022-02-16 MED ORDER — LACTATED RINGERS IV BOLUS (SEPSIS)
1000.0000 mL | Freq: Once | INTRAVENOUS | Status: AC
  Administered 2022-02-16: 1000 mL via INTRAVENOUS

## 2022-02-16 MED ORDER — METRONIDAZOLE 500 MG/100ML IV SOLN
500.0000 mg | Freq: Once | INTRAVENOUS | Status: AC
  Administered 2022-02-16: 500 mg via INTRAVENOUS
  Filled 2022-02-16: qty 100

## 2022-02-16 MED ORDER — DIVALPROEX SODIUM ER 250 MG PO TB24: 500.0000 mg | ORAL_TABLET | Freq: Every day | ORAL | Status: DC

## 2022-02-16 MED ORDER — NOREPINEPHRINE 4 MG/250ML-% IV SOLN
0.0000 ug/min | INTRAVENOUS | Status: DC
  Filled 2022-02-16: qty 250

## 2022-02-16 MED ORDER — HEPARIN SODIUM (PORCINE) 5000 UNIT/ML IJ SOLN
5000.0000 [IU] | Freq: Three times a day (TID) | INTRAMUSCULAR | Status: DC
  Administered 2022-02-16 – 2022-03-03 (×43): 5000 [IU] via SUBCUTANEOUS
  Filled 2022-02-16 (×43): qty 1

## 2022-02-16 MED ORDER — IOHEXOL 300 MG/ML  SOLN
100.0000 mL | Freq: Once | INTRAMUSCULAR | Status: AC | PRN
  Administered 2022-02-16: 100 mL via INTRAVENOUS

## 2022-02-16 MED ORDER — DIVALPROEX SODIUM ER 250 MG PO TB24
1000.0000 mg | ORAL_TABLET | Freq: Two times a day (BID) | ORAL | Status: DC
Start: 2022-02-16 — End: 2022-02-17
  Administered 2022-02-16: 500 mg via ORAL
  Filled 2022-02-16: qty 4

## 2022-02-16 MED ORDER — DEXTROSE 50 % IV SOLN: 1.0000 | Freq: Once | INTRAVENOUS | Status: DC

## 2022-02-16 MED ORDER — LACTATED RINGERS IV SOLN: INTRAVENOUS | Status: AC

## 2022-02-16 MED ORDER — SODIUM CHLORIDE 0.9 % IV SOLN
250.0000 mL | INTRAVENOUS | Status: DC
Start: 2022-02-16 — End: 2022-03-02
  Administered 2022-02-17 – 2022-02-22 (×3): 250 mL via INTRAVENOUS

## 2022-02-16 MED ORDER — POLYETHYLENE GLYCOL 3350 17 G PO PACK: 17.0000 g | PACK | Freq: Every day | ORAL | Status: DC | PRN

## 2022-02-16 MED ORDER — ALBUMIN HUMAN 25 % IV SOLN
25.0000 g | Freq: Four times a day (QID) | INTRAVENOUS | Status: AC
  Administered 2022-02-17: 25 g via INTRAVENOUS
  Filled 2022-02-16 (×3): qty 100

## 2022-02-16 MED ORDER — DEXTROSE 50 % IV SOLN
INTRAVENOUS | Status: AC
  Administered 2022-02-16: 50 mL
  Filled 2022-02-16: qty 50

## 2022-02-16 MED ORDER — VANCOMYCIN HCL 1500 MG/300ML IV SOLN
1500.0000 mg | Freq: Once | INTRAVENOUS | Status: AC
  Administered 2022-02-16: 1500 mg via INTRAVENOUS
  Filled 2022-02-16: qty 300

## 2022-02-16 MED ORDER — DOCUSATE SODIUM 100 MG PO CAPS: 100.0000 mg | ORAL_CAPSULE | Freq: Two times a day (BID) | ORAL | Status: DC | PRN

## 2022-02-16 MED ORDER — DEXTROSE 50 % IV SOLN
1.0000 | Freq: Once | INTRAVENOUS | Status: AC
Start: 2022-02-16 — End: 2022-02-16
  Administered 2022-02-16: 50 mL via INTRAVENOUS

## 2022-02-16 MED ORDER — DEXTROSE 50 % IV SOLN
INTRAVENOUS | Status: AC
  Filled 2022-02-16: qty 50

## 2022-02-16 MED ORDER — NOREPINEPHRINE 4 MG/250ML-% IV SOLN
2.0000 ug/min | INTRAVENOUS | Status: DC
  Administered 2022-02-16: 2 ug/min via INTRAVENOUS
  Administered 2022-02-17: 6 ug/min via INTRAVENOUS
  Administered 2022-02-18: 8 ug/min via INTRAVENOUS
  Administered 2022-02-18: 6 ug/min via INTRAVENOUS
  Filled 2022-02-16 (×3): qty 250

## 2022-02-16 NOTE — ED Triage Notes (Addendum)
Pt BIBA from home. HH nurse visited this AM and pt's BP was a little lower than his average. Pt alert but unable not oriented to time, place, situation (similar to baseline) ? ?Was admitted 4/16 for sepsis. ? ?Hx TBI, seizures, R BKA, L AKA. ?Multiple pressure wound in various stages. ? ?BP: 78/44 ?HR: 69 ?SPO2: 96 RA ?CBG: 100 ?

## 2022-02-16 NOTE — ED Provider Notes (Signed)
CRITICAL CARE ?Performed by: Vanetta Mulders ?Total critical care time: 60 minutes ?Critical care time was exclusive of separately billable procedures and treating other patients. ?Critical care was necessary to treat or prevent imminent or life-threatening deterioration. ?Critical care was time spent personally by me on the following activities: development of treatment plan with patient and/or surrogate as well as nursing, discussions with consultants, evaluation of patient's response to treatment, examination of patient, obtaining history from patient or surrogate, ordering and performing treatments and interventions, ordering and review of laboratory studies, ordering and review of radiographic studies, pulse oximetry and re-evaluation of patient's condition. ? ?Patient with very borderline blood pressures.  Sometimes below 90s sometimes low 90s.  Lactic acid was normal which is reassuring.  Patient glucose was originally hypoglycemic with a glucose of 43.  Received an amp of D50 and blood sugars up to 92.  Patient did not have a fever.  Urinalysis rare bacteria but did have RBCs and white blood cells.  Possible urinary tract infection lactic acid 1.5.  Valproic acid that he is on is therapeutic at 52 sodium down a little bit at 131 no leukocytosis hemoglobin down some at 8.5. ? ?CT head without any acute findings.  CT abdomen pelvis and CT left femur area without significant changes from prior studies showing evidence of the osteomyelitis and in the very deep decubiti.  Fluid collections are in general down.  Chest x-ray negative. ? ?Says blood pressures are little on the low side.  Patient started on sepsis protocol by me.  Is receiving 2 L lactated Ringer's.  Blood pressure still a little soft.  We will have the nursing norepinephrine which she can titrate as needed.  Will discuss with critical care specialist about consideration for for admission by them or admission by hospitalist. ? ?Patient's mental  status is apparently baseline he has a history of traumatic brain injury.  And sort of rambles on necessarily always coherent.  But apparently this is baseline but head CT showed no acute findings. ? ?Patient was just discharged from the hospital 2 days ago.  He was being treated by infectious disease for osteomyelitis and was taking oral antibiotics. ? ?In today for altered mental status blood pressures being a little lower than baseline. ? ?  ?Vanetta Mulders, MD ?01/28/2022 1740 ? ?

## 2022-02-16 NOTE — Progress Notes (Signed)
eLink Physician-Brief Progress Note ?Patient Name: [name] ?DOB: December 21, 1970 ?MRN: [id number] ? ? ?Date of Service ? 01/25/2022  ?HPI/Events of Note ? 51 yr old male with history of spinal cord injury and associated paralysis and osteomyelitis of sacrum just discharged from hospital 2 days prior presents hypotension, hypoglycemia, and AMS.  Ct abd/pelvis and left femur again show osteo.  BP has improved with volume resuscitation.  Glucose remains low requiring dextrose source.  Resp status stable.  Empiric abx ordered with plans for ID consult.  ?eICU Interventions ? Chart reviewed  ? ? ? ?Intervention Category ?Evaluation Type: New Patient Evaluation ? [name], P ?01/22/2022, 10:38 PM ?
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
## 2022-02-16 NOTE — Progress Notes (Signed)
Pharmacy Antibiotic Note ? ?[name] is a 51 y.o. male admitted on 02/11/2022 with confusion and hypotension.  Recent hospitalization for treatment of sepsis due to sacral osteomyelitis and staph epidermidis bacteremia.  In the ED patient received Cefepime 2gm, Metronidazole 500mg , and Vancomycin 1500mg  IV x 1 dose each.  Upon admission, Pharmacy has been consulted for Vancomycin and Cefepime dosing.   ? ?Plan: ?Cefepime 2gm IV q8h ?Vancomycin 1000 mg IV Q 12 hrs. Goal AUC 400-550.  Expected AUC: 569.8  SCr used: 0.8 (rounded up from 0.6) ?Follow culture results and sensitivities ? ? ?Height: 5\' 3"  (160 cm) ?IBW/kg (Calculated) : 56.9 ? ?Temp (24hrs), Avg:97.8 ?F (36.6 ?C), Min:96.9 ?F (36.1 ?C), Max:99.4 ?F (37.4 ?C) ? ?Recent Labs  ?Lab 02/10/22 ?0421 02/11/22 ? 02/12/22 ?0357 02/12/22 ?0805 02/12/22 ?1220 02/13/22 ?0440 01/27/2022 ?1340 02/04/2022 ?1455 02/05/2022 ?1830  ?WBC 21.6* 26.9* 23.9*  --   --  18.3* 10.5  --   --   ?CREATININE 0.60* 0.76 0.55*  --   --  0.60* 0.53* 0.60*  --   ?LATICACIDVEN  --   --   --  1.0 1.0  --  1.5  --  4.1*  ?  ?Estimated Creatinine Clearance: 89.9 mL/min (A) (by C-G formula based on SCr of 0.6 mg/dL (L)).   ? ?Allergies  ?Allergen Reactions  ? Baclofen Other (See Comments)  ?  Can cause seizures, since has epilepsy- lowers seizure threshold  ? ? ?Antimicrobials this admission: ?4/28 Metronidazole x 1 ?4/28 Cefepime >>   ?4/28 Vancomycin >> ? ?Dose adjustments this admission: ?  ? ?Microbiology results: ?4/28 BCx:   ?4/28 UCx:    ?4/28 MRSA PCR:   ? ?Thank you for allowing pharmacy to be a part of this patient?s care. ? ?5/28, PharmD ?01/29/2022 11:54 PM ? ?

## 2022-02-16 NOTE — ED Provider Notes (Signed)
?[facility]-EMERGENCY DEPT ?Provider Note ? ? ?CSN: [id number] ?Arrival date & time: 03-15-22  1307 ? ?LEVEL 5 CAVEAT - ALTERED MENTAL STATUS  ? ?History ? ?Chief Complaint  ?Patient presents with  ? Blood Infection  ? ? ?[name] is a 51 y.o. male. ? ?HPI ?51 year old male presents with confusion and low blood pressure.  History is mostly taken from mom over the phone.  Patient just got out of the hospital after a 10-day stay for osteomyelitis/wounds being infected.  Mom reports that since coming home in a little bit before he came home he is been confused.  He has been talking out of his head.  Last night he woke up repeatedly and seem to be hallucinating and talking to people that were not there.  He was getting people's names wrong and calling the mom the sister.  Today he did not take his meds except for his Depakote and refused them.  His blood pressure was low when the home health nurse came, 85/65, so he was sent to the ER via EMS.  Further history is quite limited from the patient. ? ?Home Medications ?Prior to Admission medications   ?Medication Sig Start Date End Date Taking? Authorizing Provider  ?acetaminophen (TYLENOL) 325 MG tablet Take 2 tablets (650 mg total) by mouth every 6 (six) hours as needed for mild pain (or Fever >/= 101). ?Patient not taking: Reported on 02/04/2022 12/19/21   Alicia Amel, MD  ?amoxicillin-clavulanate (AUGMENTIN) 875-125 MG tablet Take 1 tablet by mouth 2 (two) times daily for 4 days. 02/14/22 02/18/22  Almon Hercules, MD  ?ascorbic acid (VITAMIN C) 500 MG tablet Take 1 tablet (500 mg total) by mouth 2 (two) times daily. 02/14/22   Almon Hercules, MD  ?divalproex (DEPAKOTE ER) 500 MG 24 hr tablet TAKE 2 TABLETS BY MOUTH EVERY MORNING, 1 TABLET IN AFTERNOON, AND 2 TABLETS AT NIGHT. ?Patient taking differently: Take 500 mg by mouth 5 (five) times daily. 10/31/21   [name], MD  ?doxycycline (VIBRAMYCIN) 100 MG capsule Take 1 capsule (100 mg  total) by mouth 2 (two) times daily for 4 days. 02/14/22 02/18/22  Almon Hercules, MD  ?feeding supplement (ENSURE ENLIVE / ENSURE PLUS) LIQD Take 237 mLs by mouth 2 (two) times daily between meals. 02/14/22 03/16/22  Almon Hercules, MD  ?midodrine (PROAMATINE) 5 MG tablet Take 1 tablet (5 mg total) by mouth 3 (three) times daily with meals. 02/14/22   Almon Hercules, MD  ?mineral oil-hydrophilic petrolatum (AQUAPHOR) ointment Apply 1 application topically daily as needed (with pressure sore pad changes).    [provider]  ?Multiple Vitamin (MULTIVITAMIN WITH MINERALS) TABS tablet Take 1 tablet by mouth daily. 02/14/22   Almon Hercules, MD  ?senna-docusate (SENOKOT-S) 8.6-50 MG tablet Take 1 tablet by mouth 2 (two) times daily between meals as needed for moderate constipation. 02/14/22   Almon Hercules, MD  ?   ? ?Allergies    ?Baclofen   ? ?Review of Systems   ?Review of Systems  ?Unable to perform ROS: Mental status change  ?Constitutional:  Negative for fever.  ? ?Physical Exam ?Updated Vital Signs ?BP (!) 76/40   Pulse 91   Temp 99.4 ?F (37.4 ?C) (Rectal)   Resp 20   Ht 5\' 3"  (1.6 m)   SpO2 100%   BMI 24.10 kg/m?  ?Physical Exam ?Vitals and nursing note reviewed.  ?Constitutional:   ?   Appearance: He  is well-developed.  ?HENT:  ?   Head: Normocephalic and atraumatic.  ?Cardiovascular:  ?   Rate and Rhythm: Normal rate and regular rhythm.  ?   Heart sounds: Normal heart sounds.  ?Pulmonary:  ?   Effort: Pulmonary effort is normal.  ?   Breath sounds: Normal breath sounds.  ?Abdominal:  ?   General: There is no distension.  ?   Palpations: Abdomen is soft.  ?   Tenderness: There is no abdominal tenderness.  ?Skin: ?   General: Skin is warm and dry.  ?   Comments: Patient has 1 small and 1 large wound to his left buttock/ischium.  These are both draining serosanguineous fluid.  No overt cellulitis.  The left leg appears swollen compared to the right though it is unclear how chronic this is no tenderness or  fluctuance  ?Neurological:  ?   Mental Status: He is alert.  ?   Comments: Alert but confused.  [place] in [place] but then he repeatedly [place] for other questions such as month and day.  ? ? ?ED Results / Procedures / Treatments   ?Labs ?(all labs ordered are listed, but only abnormal results are displayed) ?Labs Reviewed  ?PROTIME-INR - Abnormal; Notable for the following components:  ?    Result Value  ? Prothrombin Time 16.7 (*)   ? INR 1.4 (*)   ? All other components within normal limits  ?APTT - Abnormal; Notable for the following components:  ? aPTT 20 (*)   ? All other components within normal limits  ?URINALYSIS, ROUTINE W REFLEX MICROSCOPIC - Abnormal; Notable for the following components:  ? APPearance HAZY (*)   ? Ketones, ur 5 (*)   ? Leukocytes,Ua TRACE (*)   ? Bacteria, UA RARE (*)   ? All other components within normal limits  ?I-STAT CHEM 8, ED - Abnormal; Notable for the following components:  ? Sodium 134 (*)   ? BUN 22 (*)   ? Creatinine, Ser 0.60 (*)   ? Glucose, Bld 43 (*)   ? Hemoglobin 8.8 (*)   ? HCT 26.0 (*)   ? All other components within normal limits  ?CBG MONITORING, ED - Abnormal; Notable for the following components:  ? Glucose-Capillary 47 (*)   ? All other components within normal limits  ?CULTURE, BLOOD (ROUTINE X 2)  ?CULTURE, BLOOD (ROUTINE X 2)  ?URINE CULTURE  ?LACTIC ACID, PLASMA  ?VALPROIC ACID LEVEL  ?LACTIC ACID, PLASMA  ?COMPREHENSIVE METABOLIC PANEL  ?CBC WITH DIFFERENTIAL/PLATELET  ? ? ?EKG ?EKG Interpretation ? ?Date/Time:  Friday February 16 2022 13:20:58 EDT ?Ventricular Rate:  92 ?PR Interval:  134 ?QRS Duration: 93 ?QT Interval:  343 ?QTC Calculation: 425 ?R Axis:   41 ?Text Interpretation: Sinus rhythm Abnormal R-wave progression, early transition similar to February 04 2022 Confirmed by Pricilla LovelessGoldston, Demi Trieu [phone] on 01/28/2022 3:06:20 PM ? ?Radiology ?No results found. ? ?Procedures ?Procedures  ? ? ?Medications Ordered in ED ?Medications  ?iohexol  (OMNIPAQUE) 300 MG/ML solution 100 mL (has no administration in time range)  ?sodium chloride (PF) 0.9 % injection (has no administration in time range)  ?lactated ringers bolus 1,000 mL (1,000 mLs Intravenous New Bag/Given 01/29/2022 1426)  ?dextrose 50 % solution 50 mL ( Intravenous Not Given 01/28/2022 1509)  ? ? ?ED Course/ Medical Decision Making/ A&P ?  ?                        ?  Medical Decision Making ?Amount and/or Complexity of Data Reviewed ?Labs: ordered. ?Radiology: ordered. ?ECG/medicine tests: ordered. ? ? ?Patient is found to have hypoglycemia with a glucose in the 40s.  Mom did note that he has been having poor p.o. intake.  Was given D50.  He will need further evaluation as I am not sure if this is solely causing his altered mental state and so I think he still needs CT head and images to rule out deeper infection.  Care transferred to Dr. Deretha Emory. ? ? ? ? ? ? ? ?Final Clinical Impression(s) / ED Diagnoses ?Final diagnoses:  ?None  ? ? ?Rx / DC Orders ?ED Discharge Orders   ? ? None  ? ?  ? ? ?  ?Pricilla Loveless, MD ?02/11/2022 1526 ? ?

## 2022-02-16 NOTE — Sepsis Progress Note (Signed)
Code sepsis protocol being monitored by eLink. 

## 2022-02-16 NOTE — ED Notes (Signed)
On assessment of pt., it was noted that the 20 ga. IV in the L. FA was infiltrated. Site is swollen. IV removed. IP nurse notified. ?

## 2022-02-16 NOTE — Progress Notes (Signed)
A consult was received from an ED physician for cefepime and vancomycin per pharmacy dosing.  The patient's profile has been reviewed for ht/wt/allergies/indication/available labs.   ? ?Cefepime 2 g IV x 1 is dosed appropriately as entered by provider.   ?A one time order has been placed for vancomycin 1500 mg IV.   ? ?Further antibiotics/pharmacy consults should be ordered by admitting physician if indicated.       ?                ?Thank you, ?Selinda Eon, PharmD, BCPS ?Clinical Pharmacist ?[place] ?Please utilize Amion for appropriate phone number to reach the unit pharmacist [facility]) ?02/02/2022 3:56 PM ? ? ?

## 2022-02-17 ENCOUNTER — Inpatient Hospital Stay (HOSPITAL_COMMUNITY): Payer: Medicare Other

## 2022-02-17 ENCOUNTER — Inpatient Hospital Stay: Payer: Self-pay

## 2022-02-17 DIAGNOSIS — R579 Shock, unspecified: Secondary | ICD-10-CM

## 2022-02-17 DIAGNOSIS — M4628 Osteomyelitis of vertebra, sacral and sacrococcygeal region: Secondary | ICD-10-CM

## 2022-02-17 LAB — GLUCOSE, CAPILLARY
Glucose-Capillary: 81 mg/dL (ref 70–99)
Glucose-Capillary: 66 mg/dL — ABNORMAL LOW (ref 70–99)
Glucose-Capillary: 95 mg/dL (ref 70–99)
Glucose-Capillary: 104 mg/dL — ABNORMAL HIGH (ref 70–99)
Glucose-Capillary: 120 mg/dL — ABNORMAL HIGH (ref 70–99)
Glucose-Capillary: 148 mg/dL — ABNORMAL HIGH (ref 70–99)
Glucose-Capillary: 142 mg/dL — ABNORMAL HIGH (ref 70–99)
Glucose-Capillary: 120 mg/dL — ABNORMAL HIGH (ref 70–99)

## 2022-02-17 LAB — CBC
HCT: 21 % — ABNORMAL LOW (ref 39.0–52.0)
Hemoglobin: 7.1 g/dL — ABNORMAL LOW (ref 13.0–17.0)
MCH: 30.1 pg (ref 26.0–34.0)
MCHC: 33.8 g/dL (ref 30.0–36.0)
MCV: 89 fL (ref 80.0–100.0)
Platelets: 223 10*3/uL (ref 150–400)
RBC: 2.36 MIL/uL — ABNORMAL LOW (ref 4.22–5.81)
RDW: 15.6 % — ABNORMAL HIGH (ref 11.5–15.5)
WBC: 13.7 10*3/uL — ABNORMAL HIGH (ref 4.0–10.5)
nRBC: 0 % (ref 0.0–0.2)

## 2022-02-17 LAB — BASIC METABOLIC PANEL
Anion gap: 7 (ref 5–15)
BUN: 19 mg/dL (ref 6–20)
CO2: 22 mmol/L (ref 22–32)
Calcium: 8.5 mg/dL — ABNORMAL LOW (ref 8.9–10.3)
Chloride: 104 mmol/L (ref 98–111)
Creatinine, Ser: 0.62 mg/dL (ref 0.61–1.24)
GFR, Estimated: >60 mL/min (ref >60)
Glucose, Bld: 78 mg/dL (ref 70–99)
Potassium: 3.6 mmol/L (ref 3.5–5.1)
Sodium: 133 mmol/L — ABNORMAL LOW (ref 135–145)

## 2022-02-17 LAB — MAGNESIUM: Magnesium: 1.4 mg/dL — ABNORMAL LOW (ref 1.7–2.4)

## 2022-02-17 LAB — PHOSPHORUS: Phosphorus: 4.5 mg/dL (ref 2.5–4.6)

## 2022-02-17 LAB — URINE CULTURE: Culture: NO GROWTH

## 2022-02-17 LAB — C-REACTIVE PROTEIN: CRP: 16.5 mg/dL — ABNORMAL HIGH (ref <1.0)

## 2022-02-17 LAB — LACTIC ACID, PLASMA: Lactic Acid, Venous: 1.4 mmol/L (ref 0.5–1.9)

## 2022-02-17 LAB — CORTISOL: Cortisol, Plasma: 19.2 ug/dL

## 2022-02-17 MED ORDER — MIDAZOLAM HCL 2 MG/2ML IJ SOLN
2.0000 mg | Freq: Once | INTRAMUSCULAR | Status: AC
  Administered 2022-02-17: 2 mg via INTRAVENOUS

## 2022-02-17 MED ORDER — SODIUM CHLORIDE 0.9% FLUSH
10.0000 mL | Freq: Two times a day (BID) | INTRAVENOUS | Status: DC
  Administered 2022-02-17 (×2): 30 mL
  Administered 2022-02-18 – 2022-02-20 (×5): 10 mL
  Administered 2022-02-20: 20 mL
  Administered 2022-02-21 – 2022-02-22 (×3): 10 mL
  Administered 2022-02-22: 20 mL
  Administered 2022-02-23 – 2022-03-02 (×11): 10 mL

## 2022-02-17 MED ORDER — HALOPERIDOL LACTATE 5 MG/ML IJ SOLN
INTRAMUSCULAR | Status: AC
  Filled 2022-02-17: qty 1

## 2022-02-17 MED ORDER — MIDAZOLAM HCL 2 MG/2ML IJ SOLN: 2.0000 mg | Freq: Once | INTRAMUSCULAR | Status: DC

## 2022-02-17 MED ORDER — ORAL CARE MOUTH RINSE
15.0000 mL | Freq: Two times a day (BID) | OROMUCOSAL | Status: DC
  Administered 2022-02-18 – 2022-03-02 (×21): 15 mL via OROMUCOSAL

## 2022-02-17 MED ORDER — VALPROIC ACID 250 MG/5ML PO SOLN
500.0000 mg | ORAL | Status: DC
  Filled 2022-02-17: qty 10

## 2022-02-17 MED ORDER — SODIUM CHLORIDE 0.9% FLUSH: 10.0000 mL | INTRAVENOUS | Status: DC | PRN

## 2022-02-17 MED ORDER — MIDAZOLAM HCL 2 MG/2ML IJ SOLN
INTRAMUSCULAR | Status: AC
  Filled 2022-02-17: qty 2

## 2022-02-17 MED ORDER — MAGNESIUM SULFATE 4 GM/100ML IV SOLN
4.0000 g | Freq: Once | INTRAVENOUS | Status: AC
  Administered 2022-02-17: 4 g via INTRAVENOUS
  Filled 2022-02-17: qty 100

## 2022-02-17 MED ORDER — VALPROIC ACID 250 MG/5ML PO SOLN
1000.0000 mg | Freq: Two times a day (BID) | ORAL | Status: DC
  Administered 2022-02-17: 1000 mg via ORAL
  Filled 2022-02-17 (×2): qty 20

## 2022-02-17 MED ORDER — MIDAZOLAM HCL 2 MG/2ML IJ SOLN
INTRAMUSCULAR | Status: AC
Start: 2022-02-17 — End: 2022-02-17
  Filled 2022-02-17: qty 2

## 2022-02-17 MED ORDER — VALPROATE SODIUM 100 MG/ML IV SOLN
625.0000 mg | Freq: Four times a day (QID) | INTRAVENOUS | Status: DC
  Administered 2022-02-17 – 2022-02-23 (×22): 625 mg via INTRAVENOUS
  Filled 2022-02-17 (×29): qty 6.25

## 2022-02-17 MED ORDER — CHLORHEXIDINE GLUCONATE 0.12 % MT SOLN
15.0000 mL | Freq: Two times a day (BID) | OROMUCOSAL | Status: DC
  Administered 2022-02-17 – 2022-03-02 (×25): 15 mL via OROMUCOSAL
  Filled 2022-02-17 (×24): qty 15

## 2022-02-17 MED ORDER — SODIUM CHLORIDE 0.9 % IV SOLN: INTRAVENOUS | Status: DC | PRN

## 2022-02-17 MED ORDER — HALOPERIDOL LACTATE 5 MG/ML IJ SOLN
5.0000 mg | Freq: Once | INTRAMUSCULAR | Status: AC
  Administered 2022-02-17: 5 mg via INTRAVENOUS

## 2022-02-17 NOTE — Progress Notes (Signed)
? ?NAME:  [name], MRN:  [id number], DOB:  [birth year], LOS: 1 ?ADMISSION DATE:  02/04/2022, CONSULTATION DATE:  02/18/2022 ?REFERRING MD:  Dr. Rogene Houston, CHIEF COMPLAINT:  Hypotension with concern for sepsis  ? ?History of Present Illness:  ?[name] is a 51yo male with PMH significant for spinal cord injury, quadriplegia, osteomyelitis of sacrum, neurogenic bladder requiring in and out caths, seizures, and prior wound dehiscence who presented to the [facility] ED via EMS with reports of hypotension. Of note patient was just discharged from this facility 2 days prior for management of sepsis with bacteremia secondary to osteomyelitis.  ? ?On ED arrival patient was seen hypotensive with all other vitals WNL. Lab work significant for NA 131, glucose 42, creatinine 0.53, albumin <1.5, Hgb 8.5, lactic normal at 1.5, INR 1.4. Given hypotension with concern for need of pressor support PCCM consulted for further management and admission   ? ?Pertinent  Medical History  ?spinal cord injury, quadriplegia, osteomyelitis of sacrum, neurogenic bladder requiring in and out caths, seizures, and prior wound dehiscence ? ?Significant Hospital Events: ?Including procedures, antibiotic start and stop dates in addition to other pertinent events   ?4/28 patient presented to ED after home health nurse identified hypotension.  Concern for recurrent sepsis.  Of note patient was just discharged from this facility 2 days prior ? ?Interim History / Subjective:  ? ?No acute events overnight. ? ?Patient with difficult IV access. He was started on peripheral levophed.  ? ?Objective   ?Blood pressure (!) 73/27, pulse 68, temperature 97.9 ?F (36.6 ?C), temperature source Oral, resp. rate 16, height 5\' 3"  (1.6 m), SpO2 97 %. ?   ?   ? ?Intake/Output Summary (Last 24 hours) at 02/17/2022 0908 ?Last data filed at 02/17/2022 0700 ?Gross per 24 hour  ?Intake 2286.6 ml  ?Output 300 ml  ?Net 1986.6 ml  ? ?There were no vitals filed for  this visit. ? ?Examination: ?General: chronically ill male, no acute distress, resting in bed ?HEENT: [place]/AT, dry mucous membranes, sclera anicteric ?Neuro: awake, following simple commands, but not oriented ?CV: rrr, s1s2, no murmurs ?PULM: clear to auscultation bilaterally. No wheezing ?GI: soft, non-tender, non-distended, BS+ ?Extremities: warm, no edema ?Skin: chronic wounds on ischial tuberosity - left likely stage 4. Left lateral hip - stage 4 ? ?Resolved Hospital Problem list   ?Lactic Acidosis ? ?Assessment & Plan:  ?Shock Multifactorial: Septic and hypovolemic ?Sepsis secondary to left ischial osteomyelitis and infected decubitus ulcer ?Previous Staph epi bacteremia 4/23 ?- Follow up blood cultures from admission ?- Continue Cefepime and vancomycin ?- Will consult ID for further antibiotic input ?- Continue levophed for MAP goal > 65 ?- Continue D5LR ?- Wound care consult placed  ?- Cortisol is appropriate, less concern for adrenal insufficiency ? ?Acute Metabolic Encephalopathy ?Hx of Seizures ?In setting of hypoglycemia and sepsis ?- Continue home Depakote ?- Maintain neuro protective measures ?- Nutrition and bowel regiment  ?- Seizure precautions  ?- Aspirations precautions  ? ?Hypoglycemia ?In setting of poor oral intake and sepsis ?- Continue D5LR ?- q2hr POC CBGs for now ? ?Cervical cord injury ?Quadriplegia ?Decubitus ulcer left hip unstageable ?Stage IV sacral pressure injury ?Repeat CT abdomen and pelvis 4/28 revealed persistent decubitus ulceration with underlying osteomyelitis ?- ID consult as above ?- Wound care ?- Abx as above ?- Pressure alleviating devices ? ?Difficult IV Access ?- PICC line ordered ? ?History of right BKA and left AKA ?-Patient utilizes wheelchair at baseline ?P: ?PT/OT when appropriate ? ?  Severe protein calorie malnutrition ?Albumin less than 1.5 on admission ?- Supplement protein as able  ?- Dietitian consult  ? ?Hyponatremia  ?Na 131 on admit  ?- IV hydration  ?- Trend  Bmet  ? ?Hypomagnesemia ?- replete for level > 2 ? ?Chronic Anemia ?- monitor ?- transfuse for hemoglobin < 7g/dL ? ? ?Best Practice (right click and "Reselect all SmartList Selections" daily)  ? ?Diet/type: Regular consistency (see orders) ?DVT prophylaxis: prophylactic heparin  ?GI prophylaxis: N/A ?Lines: N/A ?Foley:  N/A and Yes, and it is still needed ?Code Status:  full code ?Last date of multidisciplinary goals of care discussion: Pending  ? ?Labs   ?CBC: ?Recent Labs  ?Lab 02/11/22 ?[id number] 02/12/22 ?0357 02/12/22 ?2211 02/13/22 ?0440 02/07/2022 ?1340 02/06/2022 ?1455 02/17/22 ?0240  ?WBC 26.9* 23.9*  --  18.3* 10.5  --  13.7*  ?NEUTROABS  --   --   --  13.5* 7.3  --   --   ?HGB 7.7* 7.0* 8.3* 8.7* 8.5* 8.8* 7.1*  ?HCT 22.8* 21.0* 24.4* 25.7* 26.5* 26.0* 21.0*  ?MCV 89.4 89.7  --  89.2 95.0  --  89.0  ?PLT 108* 94*  --  113* 199  --  223  ? ? ?Basic Metabolic Panel: ?Recent Labs  ?Lab 02/11/22 ?[id number] 02/12/22 ?0357 02/13/22 ?0440 02/13/2022 ?1340 02/01/2022 ?1455 02/17/22 ?0240  ?NA 132* 131* 134* 131* 134* 133*  ?K 4.5 3.9 4.0 3.4* 3.7 3.6  ?CL 103 103 106 101 99 104  ?CO2 23 25 24 22   --  22  ?GLUCOSE 62* 60* 61* 42* 43* 78  ?BUN 16 17 17 20  22* 19  ?CREATININE 0.76 0.55* 0.60* 0.53* 0.60* 0.62  ?CALCIUM 8.1* 7.7* 8.2* 8.5*  --  8.5*  ?MG 1.4* 1.3* 1.8  --   --  1.4*  ?PHOS  --   --   --   --   --  4.5  ? ?GFR: ?Estimated Creatinine Clearance: 89.9 mL/min (by C-G formula based on SCr of 0.62 mg/dL). ?Recent Labs  ?Lab 02/12/22 ?[id number] 02/12/22 ?0805 02/12/22 ?1220 02/13/22 ?0440 02/13/2022 ?1340 01/26/2022 ?1830 02/17/22 ?0240  ?WBC 23.9*  --   --  18.3* 10.5  --  13.7*  ?LATICACIDVEN  --    < > 1.0  --  1.5 4.1* 1.4  ? < > = values in this interval not displayed.  ? ? ?Liver Function Tests: ?Recent Labs  ?Lab 02/13/22 ?0440 02/05/2022 ?1340  ?AST 15 26  ?ALT 8 10  ?ALKPHOS 107 120  ?BILITOT 0.8 0.8  ?PROT 5.6* 6.9  ?ALBUMIN <1.5* <1.5*  ? ?No results for input(s): LIPASE, AMYLASE in the last 168 hours. ?No results for  input(s): AMMONIA in the last 168 hours. ? ?ABG ?   ?Component Value Date/Time  ? PHART 7.516 (H) 01/01/2020 1214  ? PCO2ART 36.5 01/01/2020 1214  ? PO2ART 353.0 (H) 01/01/2020 1214  ? HCO3 29.5 (H) 01/01/2020 1214  ? TCO2 26 02/08/2022 1455  ? O2SAT 100.0 01/01/2020 1214  ?  ? ?Coagulation Profile: ?Recent Labs  ?Lab 02/11/2022 ?1340  ?INR 1.4*  ? ? ?Cardiac Enzymes: ?No results for input(s): CKTOTAL, CKMB, CKMBINDEX, TROPONINI in the last 168 hours. ? ?HbA1C: ?No results found for: HGBA1C ? ?CBG: ?Recent Labs  ?Lab 01/31/2022 ?1501 01/30/2022 ?1603 01/21/2022 ?2125 02/17/22 ?0139  ?GLUCAP 47* 92 53* 81  ? ?Critical care time: 40 minutes  ? ?Freda Jackson, MD ?[facility] ?Office: [phone] ? ? ?See Amion for personal  pager ?PCCM on call pager [phone] until 7pm. ?Please call Elink 7p-7a. [phone] ? ? ? ? ? ? ? ?

## 2022-02-17 NOTE — Progress Notes (Signed)
An USGPIV (ultrasound guided PIV) has been placed for short-term vasopressor infusion. A correctly placed ivWatch must be used when administering Vasopressors. Should this treatment be needed beyond 72 hours, central line access should be obtained.  It will be the responsibility of the bedside nurse to follow best practice to prevent extravasations.   ?

## 2022-02-17 NOTE — Progress Notes (Signed)
[facility] ADULT ICU REPLACEMENT PROTOCOL ? ? ?The patient does apply for the [facility] Adult ICU Electrolyte Replacment Protocol based on the criteria listed below:  ? ?1.Exclusion criteria: TCTS patients, ECMO patients, and Dialysis patients ?2. Is GFR >/= 30 ml/min? Yes.    ?Patient's GFR today is >60 ?3. Is SCr </= 2? Yes.   ?Patient's SCr is 0.62 mg/dL ?4. Did SCr increase >/= 0.5 in 24 hours? No. ?5.Pt's weight >40kg  Yes.   ?6. Abnormal electrolyte(s): mag 1.4  ?7. Electrolytes replaced per protocol ?8.  Call MD STAT for K+ </= 2.5, Phos </= 1, or Mag </= 1 ?Physician:   ? ?[facility], Rashel Okeefe A 02/17/2022 6:17 AM ? ?

## 2022-02-17 NOTE — Progress Notes (Signed)
Peripherally Inserted Central Catheter Placement ? ?The IV Nurse has discussed with the patient and/or persons authorized to consent for the patient, the purpose of this procedure and the potential benefits and risks involved with this procedure.  The benefits include less needle sticks, lab draws from the catheter, and the patient may be discharged home with the catheter. Risks include, but not limited to, infection, bleeding, blood clot (thrombus formation), and puncture of an artery; nerve damage and irregular heartbeat and possibility to perform a PICC exchange if needed/ordered by physician.  Alternatives to this procedure were also discussed.  Bard Power PICC patient education guide, fact sheet on infection prevention and patient information card has been provided to patient /or left at bedside.   ? ?Telephone consent obtained via mother. ?  ? ?PICC Placement Documentation  ?PICC Triple Lumen 02/17/22 Right Basilic 44 cm 1 cm (Active)  ? ? ? ? ? ?Vernona Rieger  Mariselda Badalamenti ?02/17/2022, 1:00 PM ? ?

## 2022-02-17 NOTE — Progress Notes (Addendum)
Initial Nutrition Assessment ? ?DOCUMENTATION CODES:  ? ?Non-severe (moderate) malnutrition in context of chronic illness ? ?INTERVENTION:  ? ?When diet is advanced, add PO supplements: ?Ensure Enlive/Plus po TID, each supplement provides 350 kcal and 13-20 grams of protein. ?Juven 1 packet BID, each packet provides 80 calories, 8 grams of carbohydrate, 2.5  grams of protein (collagen), 7 grams of L-arginine and 7 grams of L-glutamine; supplement contains CaHMB, Vitamins C, E, B12 and Zinc to promote wound healing. ?MVI with minerals daily ? ?If unable to safely advance diet within the next 24-48 hours, recommend placement of small bore feeding tube for enteral nutrition supplementation.  ? ?NUTRITION DIAGNOSIS:  ? ?Increased nutrient needs related to wound healing as evidenced by estimated needs. ? ?GOAL:  ? ?Patient will meet greater than or equal to 90% of their needs ? ?MONITOR:  ? ?PO intake, Supplement acceptance, Labs, Skin ? ?REASON FOR ASSESSMENT:  ? ?Malnutrition Screening Tool ?  ? ?ASSESSMENT:  ? ?51 yo male admitted with hypotension, recurrent sepsis d/t left ischial osteomyelitis and infected decubitus ulcer. PMH includes staph epidermidis bacteremia, SCI, quadriplegia, sacral osteomyelitis, neurogenic bladder, seizures, tobacco use, L AKA, R BKA. ? ?PICC being placed today. ?RD working remotely. ?Unable to speak with patient or complete nutrition focused physical exam at this time. ?Patient has multiple pressure injuries. ?Currently NPO.  ?If unable to advance diet within the next 24-48 hours, may need to consider placement of a small bore feeding tube for tube feeding. ? ?Labs reviewed. Na 133, mag 1.4, CRP 16.5 ?CBG: 66-95 ? ?Medications reviewed and include mag sulfate x 1 this morning, Levophed. ? ?Weight hx reviewed. 61.7 kg documented 03/27/21, 12/17/21, and 02/12/22. Need to obtain accurate weight if able. ?Unsure accuracy of current documented height. On multiple previous admissions, patient's  height has been 6\' 2" . Currently documented at 5\' 3" . ? ?Per nutrition assessment on 4/24 (previous admission), patient was identified with moderate malnutrition in the context of chronic illness. Suspect this is ongoing.  ? ?NUTRITION - FOCUSED PHYSICAL EXAM: ? ?Unable to complete ? ?Diet Order:   ?Diet Order   ? ?       ?  Diet NPO time specified  Diet effective now       ?  ? ?  ?  ? ?  ? ? ?EDUCATION NEEDS:  ? ?Not appropriate for education at this time ? ?Skin:  Skin Integrity Issues:: Stage II, DTI, Stage IV ?DTI: R hip ?Stage II: R buttocks ?Stage IV: L ischial tuberosity, L hip x 2 ? ?Last BM:  4/28 ? ?Height:  ? ?Ht Readings from Last 1 Encounters:  ?02/02/2022 5\' 3"  (1.6 m)  ? ? ?Weight:  ? ?Wt Readings from Last 1 Encounters:  ?02/12/22 61.7 kg  ? ? ? ?BMI:  Body mass index is 24.1 kg/m?. ? ?Estimated Nutritional Needs:  ? ?Kcal:  2000-2200 ? ?Protein:  100-120 gm ? ?Fluid:  2.2-2.4 L ? ? ? ?Lucas Mallow RD, LDN, CNSC ?Please refer to Amion for contact information.                                                       ? ?

## 2022-02-17 NOTE — Progress Notes (Signed)
At bedside for PICC placement. Pt extremely verbally abusive and physically aggressive. Primary and charge RN at bedside. Pt refusing placement. Primary RN reported to MD and sedation to be ordered. ?

## 2022-02-17 NOTE — Plan of Care (Signed)
?  Problem: Elimination: ?Goal: Will not experience complications related to urinary retention ?Outcome: Not Progressing ?  ?Problem: Skin Integrity: ?Goal: Risk for impaired skin integrity will decrease ?Outcome: Not Progressing ?  ?

## 2022-02-17 NOTE — Progress Notes (Signed)
Patient's IV is no longer functional.  Site became occluded.  Several attempts were made to reposition IV catheter in order to flush the line with no success.  E-link was made aware of the situation.  There is an order for a midline catheter.  IV line failed while administering albumin.  IV potassium was never administered. ?
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
## 2022-02-18 DIAGNOSIS — R4182 Altered mental status, unspecified: Secondary | ICD-10-CM | POA: Diagnosis not present

## 2022-02-18 LAB — BASIC METABOLIC PANEL
Anion gap: 5 (ref 5–15)
BUN: 11 mg/dL (ref 6–20)
CO2: 26 mmol/L (ref 22–32)
Calcium: 8.6 mg/dL — ABNORMAL LOW (ref 8.9–10.3)
Chloride: 107 mmol/L (ref 98–111)
Creatinine, Ser: 0.62 mg/dL (ref 0.61–1.24)
GFR, Estimated: >60 mL/min (ref >60)
Glucose, Bld: 146 mg/dL — ABNORMAL HIGH (ref 70–99)
Potassium: 2.7 mmol/L — CL (ref 3.5–5.1)
Sodium: 138 mmol/L (ref 135–145)

## 2022-02-18 LAB — GLUCOSE, CAPILLARY
Glucose-Capillary: 100 mg/dL — ABNORMAL HIGH (ref 70–99)
Glucose-Capillary: 105 mg/dL — ABNORMAL HIGH (ref 70–99)
Glucose-Capillary: 123 mg/dL — ABNORMAL HIGH (ref 70–99)
Glucose-Capillary: 128 mg/dL — ABNORMAL HIGH (ref 70–99)
Glucose-Capillary: 131 mg/dL — ABNORMAL HIGH (ref 70–99)
Glucose-Capillary: 143 mg/dL — ABNORMAL HIGH (ref 70–99)

## 2022-02-18 LAB — CBC
HCT: 21.9 % — ABNORMAL LOW (ref 39.0–52.0)
Hemoglobin: 7.5 g/dL — ABNORMAL LOW (ref 13.0–17.0)
MCH: 31.1 pg (ref 26.0–34.0)
MCHC: 34.2 g/dL (ref 30.0–36.0)
MCV: 90.9 fL (ref 80.0–100.0)
Platelets: 240 10*3/uL (ref 150–400)
RBC: 2.41 MIL/uL — ABNORMAL LOW (ref 4.22–5.81)
RDW: 16 % — ABNORMAL HIGH (ref 11.5–15.5)
WBC: 10.2 10*3/uL (ref 4.0–10.5)
nRBC: 0.2 % (ref 0.0–0.2)

## 2022-02-18 LAB — MAGNESIUM: Magnesium: 2 mg/dL (ref 1.7–2.4)

## 2022-02-18 MED ORDER — MIDODRINE HCL 5 MG PO TABS
5.0000 mg | ORAL_TABLET | Freq: Three times a day (TID) | ORAL | Status: DC
  Administered 2022-02-19 – 2022-02-21 (×4): 5 mg via ORAL
  Filled 2022-02-18 (×6): qty 1

## 2022-02-18 MED ORDER — NOREPINEPHRINE 4 MG/250ML-% IV SOLN
0.0000 ug/min | INTRAVENOUS | Status: DC
  Administered 2022-02-18 – 2022-02-19 (×2): 6 ug/min via INTRAVENOUS
  Administered 2022-02-19: 3 ug/min via INTRAVENOUS
  Administered 2022-02-20 – 2022-02-21 (×2): 2 ug/min via INTRAVENOUS
  Administered 2022-02-22 (×2): 5 ug/min via INTRAVENOUS
  Filled 2022-02-18 (×7): qty 250

## 2022-02-18 MED ORDER — POTASSIUM CHLORIDE 10 MEQ/50ML IV SOLN
10.0000 meq | INTRAVENOUS | Status: AC
  Administered 2022-02-18 (×6): 10 meq via INTRAVENOUS
  Filled 2022-02-18 (×6): qty 50

## 2022-02-18 MED ORDER — POTASSIUM CHLORIDE 10 MEQ/50ML IV SOLN
INTRAVENOUS | Status: AC
  Administered 2022-02-18: 10 meq
  Filled 2022-02-18: qty 50

## 2022-02-18 MED ORDER — METRONIDAZOLE 500 MG/100ML IV SOLN
500.0000 mg | Freq: Two times a day (BID) | INTRAVENOUS | Status: DC
  Administered 2022-02-18 – 2022-02-20 (×5): 500 mg via INTRAVENOUS
  Filled 2022-02-18 (×5): qty 100

## 2022-02-18 NOTE — Progress Notes (Signed)
? ?NAME:  [name], MRN:  [id number], DOB:  [birth year], LOS: 2 ?ADMISSION DATE:  01/31/2022, CONSULTATION DATE:  02/05/2022 ?REFERRING MD:  Dr. Rogene Houston, CHIEF COMPLAINT:  Hypotension with concern for sepsis  ? ?History of Present Illness:  ?[name] is a 51yo male with PMH significant for spinal cord injury, quadriplegia, osteomyelitis of sacrum, neurogenic bladder requiring in and out caths, seizures, and prior wound dehiscence who presented to the [facility] ED via EMS with reports of hypotension. Of note patient was just discharged from this facility 2 days prior for management of sepsis with bacteremia secondary to osteomyelitis.  ? ?On ED arrival patient was seen hypotensive with all other vitals WNL. Lab work significant for NA 131, glucose 42, creatinine 0.53, albumin <1.5, Hgb 8.5, lactic normal at 1.5, INR 1.4. Given hypotension with concern for need of pressor support PCCM consulted for further management and admission   ? ?Pertinent  Medical History  ?spinal cord injury, quadriplegia, osteomyelitis of sacrum, neurogenic bladder requiring in and out caths, seizures, and prior wound dehiscence ? ?Significant Hospital Events: ?Including procedures, antibiotic start and stop dates in addition to other pertinent events   ?4/28 patient presented to ED after home health nurse identified hypotension.  Concern for recurrent sepsis.  Of note patient was just discharged from this facility 2 days prior ?4/29 PICC line placed ? ?Interim History / Subjective:  ? ?No acute events overnight. ?Remains on levophed ?Did not pass bedside swallow eval ? ? ?Objective   ?Blood pressure 110/61, pulse 70, temperature (!) 97.5 ?F (36.4 ?C), temperature source Oral, resp. rate (!) 21, height 5\' 3"  (1.6 m), weight 65.4 kg, SpO2 100 %. ?   ?   ? ?Intake/Output Summary (Last 24 hours) at 02/18/2022 0732 ?Last data filed at 02/18/2022 0640 ?Gross per 24 hour  ?Intake 1778.89 ml  ?Output 2750 ml  ?Net -971.11 ml  ? ?Filed  Weights  ? 02/18/22 0500  ?Weight: 65.4 kg  ? ? ?Examination: ?General: chronically ill male, no acute distress, resting in bed ?HEENT: [place]/AT, dry mucous membranes, sclera anicteric ?Neuro: awake, following simple commands, oriented to person and place but not time ?CV: rrr, s1s2, no murmurs ?PULM: clear to auscultation bilaterally. No wheezing ?GI: soft, non-tender, non-distended, BS+ ?Extremities: warm, no edema ?Skin: chronic wounds on ischial tuberosity - left likely stage 4. Left lateral hip - stage 4 ? ?Resolved Hospital Problem list   ?Lactic Acidosis ? ?Assessment & Plan:  ?Shock Multifactorial: Septic and hypovolemic ?Sepsis secondary to left ischial osteomyelitis and infected decubitus ulcer ?Previous Staph epi bacteremia 4/23 ?- Follow up blood cultures from admission ?- Continue Cefepime, vancomycin and flagyl ?- ID is following ?- Continue levophed for MAP goal > 65 ?- Start midodrine 5mg  TID ?- Continue D5LR ?- Wound care consult placed  ?- Cortisol is appropriate, less concern for adrenal insufficiency ? ?Acute Metabolic Encephalopathy ?Hx of Seizures ?In setting of hypoglycemia and sepsis ?- Continue home Depakote ?- Maintain neuro protective measures ?- Seizure precautions  ?- Aspiration precautions  ? ?Hypoglycemia ?In setting of poor oral intake and sepsis ?- Continue D5LR ?- q4hr POC CBGs  ? ?Cervical cord injury ?Quadriplegia ?Decubitus ulcer left hip unstageable ?Stage IV sacral pressure injury ?Repeat CT abdomen and pelvis 4/28 revealed persistent decubitus ulceration with underlying osteomyelitis ?- ID consult as above ?- Wound care ?- Abx as above ?- Pressure alleviating devices ? ?Difficult IV Access ?- PICC line in place ? ?History of right BKA and left  AKA ?-Patient utilizes wheelchair at baseline ?P: ?PT/OT when appropriate ? ?Severe protein calorie malnutrition ?Albumin less than 1.5 on admission ?- Supplement protein as able  ?- Dietitian consult  ?- Will need NG tube place to start  tube feeds if he does not pass swallow eval ? ?Hyponatremia - improved ?Na 131 on admit  ?- IV hydration  ?- Trend Bmet  ? ?Hypomagnesemia - improved ?- replete for level > 2 ? ?Chronic Anemia ?- monitor ?- transfuse for hemoglobin < 7g/dL ? ? ?Best Practice (right click and "Reselect all SmartList Selections" daily)  ? ?Diet/type: Regular consistency (see orders) ?DVT prophylaxis: prophylactic heparin  ?GI prophylaxis: N/A ?Lines: N/A ?Foley:  N/A and Yes, and it is still needed ?Code Status:  full code ?Last date of multidisciplinary goals of care discussion: Pending  ? ?Labs   ?CBC: ?Recent Labs  ?Lab 02/12/22 ?[id number] 02/12/22 ?2211 02/13/22 ?0440 01/25/2022 ?1340 02/01/2022 ?1455 02/17/22 ?0240 02/18/22 ?0703  ?WBC 23.9*  --  18.3* 10.5  --  13.7* 10.2  ?NEUTROABS  --   --  13.5* 7.3  --   --   --   ?HGB 7.0*   < > 8.7* 8.5* 8.8* 7.1* 7.5*  ?HCT 21.0*   < > 25.7* 26.5* 26.0* 21.0* 21.9*  ?MCV 89.7  --  89.2 95.0  --  89.0 90.9  ?PLT 94*  --  113* 199  --  223 240  ? < > = values in this interval not displayed.  ? ? ?Basic Metabolic Panel: ?Recent Labs  ?Lab 02/12/22 ?[id number] 02/13/22 ?0440 01/21/2022 ?1340 02/10/2022 ?1455 02/17/22 ?0240  ?NA 131* 134* 131* 134* 133*  ?K 3.9 4.0 3.4* 3.7 3.6  ?CL 103 106 101 99 104  ?CO2 25 24 22   --  22  ?GLUCOSE 60* 61* 42* 43* 78  ?BUN 17 17 20  22* 19  ?CREATININE 0.55* 0.60* 0.53* 0.60* 0.62  ?CALCIUM 7.7* 8.2* 8.5*  --  8.5*  ?MG 1.3* 1.8  --   --  1.4*  ?PHOS  --   --   --   --  4.5  ? ?GFR: ?Estimated Creatinine Clearance: 89.9 mL/min (by C-G formula based on SCr of 0.62 mg/dL). ?Recent Labs  ?Lab 02/12/22 ?1220 02/13/22 ?0440 02/10/2022 ?1340 02/17/2022 ?1830 02/17/22 ?0240 02/18/22 ?0703  ?WBC  --  18.3* 10.5  --  13.7* 10.2  ?LATICACIDVEN 1.0  --  1.5 4.1* 1.4  --   ? ? ?Liver Function Tests: ?Recent Labs  ?Lab 02/13/22 ?0440 02/15/2022 ?1340  ?AST 15 26  ?ALT 8 10  ?ALKPHOS 107 120  ?BILITOT 0.8 0.8  ?PROT 5.6* 6.9  ?ALBUMIN <1.5* <1.5*  ? ?No results for input(s): LIPASE, AMYLASE in  the last 168 hours. ?No results for input(s): AMMONIA in the last 168 hours. ? ?ABG ?   ?Component Value Date/Time  ? PHART 7.516 (H) 01/01/2020 1214  ? PCO2ART 36.5 01/01/2020 1214  ? PO2ART 353.0 (H) 01/01/2020 1214  ? HCO3 29.5 (H) 01/01/2020 1214  ? TCO2 26 02/08/2022 1455  ? O2SAT 100.0 01/01/2020 1214  ?  ? ?Coagulation Profile: ?Recent Labs  ?Lab 02/07/2022 ?1340  ?INR 1.4*  ? ? ?Cardiac Enzymes: ?No results for input(s): CKTOTAL, CKMB, CKMBINDEX, TROPONINI in the last 168 hours. ? ?HbA1C: ?No results found for: HGBA1C ? ?CBG: ?Recent Labs  ?Lab 02/17/22 ?1855 02/17/22 ?2010 02/17/22 ?2215 02/18/22 ?0017 02/18/22 ?0430  ?GLUCAP 148* 142* 120* 123* 131*  ? ?Critical care time: 35 minutes  ? ?  Freda Jackson, MD ?[facility] ?Office: [phone] ? ? ?See Amion for personal pager ?PCCM on call pager [phone] until 7pm. ?Please call Elink 7p-7a. [phone] ? ? ? ? ? ? ? ?

## 2022-02-19 DIAGNOSIS — M86159 Other acute osteomyelitis, unspecified femur: Secondary | ICD-10-CM | POA: Diagnosis not present

## 2022-02-19 LAB — CBC
HCT: 23.3 % — ABNORMAL LOW (ref 39.0–52.0)
Hemoglobin: 7.6 g/dL — ABNORMAL LOW (ref 13.0–17.0)
MCH: 29.9 pg (ref 26.0–34.0)
MCHC: 32.6 g/dL (ref 30.0–36.0)
MCV: 91.7 fL (ref 80.0–100.0)
Platelets: 210 10*3/uL (ref 150–400)
RBC: 2.54 MIL/uL — ABNORMAL LOW (ref 4.22–5.81)
RDW: 16.3 % — ABNORMAL HIGH (ref 11.5–15.5)
WBC: 7.1 10*3/uL (ref 4.0–10.5)
nRBC: 0.3 % — ABNORMAL HIGH (ref 0.0–0.2)

## 2022-02-19 LAB — BASIC METABOLIC PANEL
Anion gap: 3 — ABNORMAL LOW (ref 5–15)
BUN: 8 mg/dL (ref 6–20)
CO2: 25 mmol/L (ref 22–32)
Calcium: 8.6 mg/dL — ABNORMAL LOW (ref 8.9–10.3)
Chloride: 113 mmol/L — ABNORMAL HIGH (ref 98–111)
Creatinine, Ser: 0.59 mg/dL — ABNORMAL LOW (ref 0.61–1.24)
GFR, Estimated: >60 mL/min (ref >60)
Glucose, Bld: 99 mg/dL (ref 70–99)
Potassium: 2.7 mmol/L — CL (ref 3.5–5.1)
Sodium: 141 mmol/L (ref 135–145)

## 2022-02-19 LAB — GLUCOSE, CAPILLARY
Glucose-Capillary: 103 mg/dL — ABNORMAL HIGH (ref 70–99)
Glucose-Capillary: 109 mg/dL — ABNORMAL HIGH (ref 70–99)
Glucose-Capillary: 121 mg/dL — ABNORMAL HIGH (ref 70–99)
Glucose-Capillary: 89 mg/dL (ref 70–99)
Glucose-Capillary: 89 mg/dL (ref 70–99)
Glucose-Capillary: 97 mg/dL (ref 70–99)

## 2022-02-19 LAB — MAGNESIUM: Magnesium: 1.8 mg/dL (ref 1.7–2.4)

## 2022-02-19 LAB — POTASSIUM: Potassium: 3.8 mmol/L (ref 3.5–5.1)

## 2022-02-19 MED ORDER — JUVEN PO PACK
1.0000 | PACK | Freq: Two times a day (BID) | ORAL | Status: DC
  Administered 2022-02-20 (×2): 1 via ORAL
  Filled 2022-02-19 (×3): qty 1

## 2022-02-19 MED ORDER — DAKINS (1/4 STRENGTH) 0.125 % EX SOLN
Freq: Two times a day (BID) | CUTANEOUS | Status: AC
  Administered 2022-02-19 – 2022-02-20 (×2): 1 via TOPICAL
  Filled 2022-02-19 (×3): qty 473

## 2022-02-19 MED ORDER — ADULT MULTIVITAMIN W/MINERALS CH
1.0000 | ORAL_TABLET | Freq: Every day | ORAL | Status: DC
  Administered 2022-02-19 – 2022-02-22 (×3): 1 via ORAL
  Filled 2022-02-19 (×3): qty 1

## 2022-02-19 MED ORDER — DOXYCYCLINE HYCLATE 100 MG PO TABS
100.0000 mg | ORAL_TABLET | Freq: Two times a day (BID) | ORAL | Status: DC
  Administered 2022-02-19: 100 mg via ORAL
  Filled 2022-02-19 (×2): qty 1

## 2022-02-19 MED ORDER — ENSURE ENLIVE PO LIQD
237.0000 mL | Freq: Two times a day (BID) | ORAL | Status: DC
  Administered 2022-02-20 (×2): 237 mL via ORAL

## 2022-02-19 MED ORDER — PANTOPRAZOLE SODIUM 40 MG PO TBEC
40.0000 mg | DELAYED_RELEASE_TABLET | Freq: Every day | ORAL | Status: DC
Start: 2022-02-19 — End: 2022-02-20
  Administered 2022-02-19: 40 mg via ORAL
  Filled 2022-02-19 (×2): qty 1

## 2022-02-19 MED ORDER — POTASSIUM CHLORIDE 10 MEQ/50ML IV SOLN
10.0000 meq | INTRAVENOUS | Status: AC
  Administered 2022-02-19 (×8): 10 meq via INTRAVENOUS
  Filled 2022-02-19 (×8): qty 50

## 2022-02-19 MED ORDER — MAGNESIUM SULFATE 2 GM/50ML IV SOLN
2.0000 g | Freq: Once | INTRAVENOUS | Status: AC
  Administered 2022-02-19: 2 g via INTRAVENOUS
  Filled 2022-02-19: qty 50

## 2022-02-19 NOTE — Consult Note (Signed)
WOC Nurse Consult Note: ?Patient receiving care in [facility] 1225. Consult completed remotely after review of record. Patient will be seen by WOC in the morning 02/20/22. ?Reason for Consult: pressure wounds ?Wound type: Chronic, non-healing bilateral trochanteric wounds with extensive undermining and chronic sacral wound. Known osteomyelitis to pelvic region ?Pressure Injury POA: Yes ?Dressing procedure/placement/frequency: ?Moisten kerlix with Dakin's solution. Insert the moistened kerlix into the right and left trochanter wounds, and the sacral wound. Cover with ABD pads, tape in place. The duration of this order is 5 days at twice daily. ? ?Helmut Muster, RN, MSN, CWOCN, CNS-BC, pager [phone]  ?

## 2022-02-19 NOTE — Progress Notes (Signed)
? ?NAME:  [name], MRN:  [id number], DOB:  [birth year], LOS: 3 ?ADMISSION DATE:  01/26/2022, CONSULTATION DATE:  02/04/2022 ?REFERRING MD:  Dr. Rogene Houston, CHIEF COMPLAINT:  Hypotension with concern for sepsis  ? ?History of Present Illness:  ?[name] is a 51yo male with PMH significant for spinal cord injury, quadriplegia, osteomyelitis of sacrum, neurogenic bladder requiring in and out caths, seizures, and prior wound dehiscence who presented to the [facility] ED via EMS with reports of hypotension. Of note patient was just discharged from this facility 2 days prior for management of sepsis with bacteremia secondary to osteomyelitis.  ? ?On ED arrival patient was seen hypotensive with all other vitals WNL. Lab work significant for NA 131, glucose 42, creatinine 0.53, albumin <1.5, Hgb 8.5, lactic normal at 1.5, INR 1.4. Given hypotension with concern for need of pressor support PCCM consulted for further management and admission   ? ?Pertinent  Medical History  ?spinal cord injury, quadriplegia, osteomyelitis of sacrum, neurogenic bladder requiring in and out caths, seizures, and prior wound dehiscence ? ?Significant Hospital Events: ?Including procedures, antibiotic start and stop dates in addition to other pertinent events   ?4/28 patient presented to ED after home health nurse identified hypotension.  Concern for recurrent sepsis.  Of note patient was just discharged from this facility 2 days prior ?4/29 PICC line placed ? ?Interim History / Subjective:  ? ?No events overnight ?89mcg levo still no change ?Failed swallow eval so unable to get midodrine.  ?Reportedly he is resisting RN care ? ?Family has POA (mother and brother listed as well) they insist we do not ask his many questions because his answers sound reliable, but are not.  ? ?Objective   ?Blood pressure 116/79, pulse 61, temperature (!) 96.3 ?F (35.7 ?C), temperature source Axillary, resp. rate (!) 22, height 5\' 3"  (1.6 m), weight 65.2  kg, SpO2 99 %. ?   ?   ? ?Intake/Output Summary (Last 24 hours) at 02/19/2022 1310 ?Last data filed at 02/19/2022 [id number] ?Gross per 24 hour  ?Intake 3791.84 ml  ?Output 2426 ml  ?Net 1365.84 ml  ? ? ?Filed Weights  ? 02/18/22 0500 02/19/22 0433  ?Weight: 65.4 kg 65.2 kg  ? ? ?Examination:  ?General: cachectic middle aged male in NAD ?HEENT: [place]/AT, pERRL, no JVD. MM dry ?Neuro: Awake, alert, oriented to self.  ?CV: regular rate and rhythm  ?PULM: clear, no distress  ?GI: Soft, non-tender, non-distended ?Extremities: Bilateral lower extremity amputations.  ?Skin: chronic wounds on ischial tuberosity - left likely stage 4. Left lateral hip - stage 4 ? ?Resolved Hospital Problem list   ?Lactic Acidosis ? ?Assessment & Plan:  ?Shock Multifactorial: Septic and hypovolemic ?Sepsis secondary to left ischial osteomyelitis and infected decubitus ulcer ?Previous Staph epi bacteremia 4/23 ?- Continue Cefepime, vancomycin and flagyl per ID ?- Cultures pending ?- ID is following ?- Continue levophed for MAP goal > 65 ?- Cortisol 19, defer steroids ?- Start midodrine 5mg  TID, but unable to take PO ?- Wound care consult placed  ? ?Acute Metabolic Encephalopathy ?Hx of Seizures ?In setting of hypoglycemia and sepsis ?- Continue home Depakote ?- Maintain neuro protective measures ?- Seizure precautions  ?- Aspiration precautions  ? ?Hypoglycemia ?In setting of poor oral intake and sepsis ?- Continue D5LR ?- q4hr POC CBGs  ? ?Cervical cord injury ?Quadriplegia ?Decubitus ulcer left hip unstageable ?Stage IV sacral pressure injury ?Repeat CT abdomen and pelvis 4/28 revealed persistent decubitus ulceration with underlying osteomyelitis ?- ID  consult as above ?- Wound care ?- Abx as above ?- Pressure alleviating devices ? ?Difficult IV Access ?- PICC line in place ? ?History of right BKA and left AKA ?-Patient utilizes wheelchair at baseline ?P: ?PT/OT when appropriate ? ?Severe protein calorie malnutrition ?Albumin less than 1.5 on  admission ?- Supplement protein as able  ?- Dietitian consult  ?- Will ask RN to place cortrak.  ? ?Hyponatremia - improved ?Na 131 on admit  ?- IV hydration  ?- Trend Bmet  ? ?Chronic Anemia ?- monitor ?- transfuse for hemoglobin < 7g/dL ? ? ?Best Practice (right click and "Reselect all SmartList Selections" daily)  ? ?Diet/type: Regular consistency (see orders) ?DVT prophylaxis: prophylactic heparin  ?GI prophylaxis: PPI ?Lines: Central line PICC ?Foley:  N/A and Yes, and it is still needed ?Code Status:  full code ?Last date of multidisciplinary goals of care discussion: Pending  ? ?Labs   ?CBC: ?Recent Labs  ?Lab 02/13/22 ?0440 01/27/2022 ?1340 01/23/2022 ?1455 02/17/22 ?0240 02/18/22 ?0703 02/19/22 ?[id number]  ?WBC 18.3* 10.5  --  13.7* 10.2 7.1  ?NEUTROABS 13.5* 7.3  --   --   --   --   ?HGB 8.7* 8.5* 8.8* 7.1* 7.5* 7.6*  ?HCT 25.7* 26.5* 26.0* 21.0* 21.9* 23.3*  ?MCV 89.2 95.0  --  89.0 90.9 91.7  ?PLT 113* 199  --  223 240 210  ? ? ? ?Basic Metabolic Panel: ?Recent Labs  ?Lab 02/13/22 ?0440 01/24/2022 ?1340 01/28/2022 ?1455 02/17/22 ?0240 02/18/22 ?0703 02/19/22 ?[id number]  ?NA 134* 131* 134* 133* 138 141  ?K 4.0 3.4* 3.7 3.6 2.7* 2.7*  ?CL 106 101 99 104 107 113*  ?CO2 24 22  --  22 26 25   ?GLUCOSE 61* 42* 43* 78 146* 99  ?BUN 17 20 22* 19 11 8   ?CREATININE 0.60* 0.53* 0.60* 0.62 0.62 0.59*  ?CALCIUM 8.2* 8.5*  --  8.5* 8.6* 8.6*  ?MG 1.8  --   --  1.4* 2.0 1.8  ?PHOS  --   --   --  4.5  --   --   ? ? ?GFR: ?Estimated Creatinine Clearance: 89.9 mL/min (A) (by C-G formula based on SCr of 0.59 mg/dL (L)). ?Recent Labs  ?Lab 02/05/2022 ?1340 01/30/2022 ?1830 02/17/22 ?0240 02/18/22 ?0703 02/19/22 ?[id number]  ?WBC 10.5  --  13.7* 10.2 7.1  ?LATICACIDVEN 1.5 4.1* 1.4  --   --   ? ? ? ?Liver Function Tests: ?Recent Labs  ?Lab 02/13/22 ?0440 01/28/2022 ?1340  ?AST 15 26  ?ALT 8 10  ?ALKPHOS 107 120  ?BILITOT 0.8 0.8  ?PROT 5.6* 6.9  ?ALBUMIN <1.5* <1.5*  ? ? ?No results for input(s): LIPASE, AMYLASE in the last 168 hours. ?No results for  input(s): AMMONIA in the last 168 hours. ? ?ABG ?   ?Component Value Date/Time  ? PHART 7.516 (H) 01/01/2020 1214  ? PCO2ART 36.5 01/01/2020 1214  ? PO2ART 353.0 (H) 01/01/2020 1214  ? HCO3 29.5 (H) 01/01/2020 1214  ? TCO2 26 01/29/2022 1455  ? O2SAT 100.0 01/01/2020 1214  ? ?  ? ?Coagulation Profile: ?Recent Labs  ?Lab 02/10/2022 ?1340  ?INR 1.4*  ? ? ? ?Cardiac Enzymes: ?No results for input(s): CKTOTAL, CKMB, CKMBINDEX, TROPONINI in the last 168 hours. ? ?HbA1C: ?No results found for: HGBA1C ? ?CBG: ?Recent Labs  ?Lab 02/18/22 ?1554 02/18/22 ?1959 02/19/22 ?0021 02/19/22 ?0755 02/19/22 ?1136  ?GLUCAP 105* 100* 97 103* 89  ? ? ?Critical care time: 35 minutes  ? ? ?Eddie Dibbles  Kennidi Yoshida, AGACNP-BC ?[facility] ? ?See Amion for personal pager ?PCCM on call pager [phone] until 7pm. ?Please call Elink 7p-7a. [phone] ? ?02/19/2022 1:24 PM ? ? ? ? ? ? ? ? ? ?

## 2022-02-19 NOTE — Progress Notes (Signed)
Pharmacy Antibiotic Note ? ?[name] is a 51 y.o. male admitted on 2022/03/11 with confusion and hypotension.  Recent hospitalization for treatment of sepsis due to sacral osteomyelitis and staph epidermidis bacteremia.  In the ED patient received Cefepime 2g, Metronidazole 500mg , and Vancomycin 1500mg  IV x 1 dose each.  Upon admission, Pharmacy has been consulted for Vancomycin and Cefepime dosing.   ? ?Day 4 Vanc/Cefepime/Flagyl per ID rec's ?SCr stable ?WBC down improved ?No growth to date per culture data ? ?Plan: ?Continue Cefepime 2g IV q8h per current renal function ?Continue vancomycin 1000 mg IV Q 12 hrs. Goal AUC 400-550.  Expected AUC: 569.8  SCr used: 0.8 (rounded up from 0.6) ?Follow culture results and sensitivities ?No Vanc levels yet, await further plan for antibiotics per ID team ? ? ?Height: 5\' 3"  (160 cm) ?Weight: 65.2 kg (143 lb 11.8 oz) ?IBW/kg (Calculated) : 56.9 ? ?Temp (24hrs), Avg:97.6 ?F (36.4 ?C), Min:97.2 ?F (36.2 ?C), Max:97.9 ?F (36.6 ?C) ? ?Recent Labs  ?Lab 02/12/22 ?1220 02/13/22 ?0440 02/13/22 ?0440 Mar 11, 2022 ?1340 11-Mar-2022 ?1455 11-Mar-2022 ?1830 02/17/22 ?0240 02/18/22 ?0703 02/19/22 ?02/19/22  ?WBC  --  18.3*  --  10.5  --   --  13.7* 10.2 7.1  ?CREATININE  --  0.60*   < > 0.53* 0.60*  --  0.62 0.62 0.59*  ?LATICACIDVEN 1.0  --   --  1.5  --  4.1* 1.4  --   --   ? < > = values in this interval not displayed.  ? ?  ?Estimated Creatinine Clearance: 89.9 mL/min (A) (by C-G formula based on SCr of 0.59 mg/dL (L)).   ? ?Allergies  ?Allergen Reactions  ? Baclofen Other (See Comments)  ?  Can cause seizures, since has epilepsy- lowers seizure threshold  ? ? ?Antimicrobials this admission: ?4/28 Metronidazole x 1 ?4/28 Cefepime >>   ?4/28 Vancomycin >> ? ?Dose adjustments this admission: ?  ? ?Microbiology results: ?4/28 BCx:  ngtd ?4/28 UCx:  ngf ?4/28 MRSA PCR:  not detected ? ?Thank you for allowing pharmacy to be a part of this patient?s care. ? ? ?5/28, PharmD,  BCPS ?Secure Chat if ?s ?02/19/2022 8:44 AM ? ? ?
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
## 2022-02-19 NOTE — Progress Notes (Signed)
[facility] ADULT ICU REPLACEMENT PROTOCOL ? ? ?The patient does apply for the [facility] Adult ICU Electrolyte Replacment Protocol based on the criteria listed below:  ? ?1.Exclusion criteria: TCTS patients, ECMO patients, and Dialysis patients ?2. Is GFR >/= 30 ml/min? Yes.    ?Patient's GFR today is >60 ?3. Is SCr </= 2? Yes.   ?Patient's SCr is 0.59 mg/dL ?4. Did SCr increase >/= 0.5 in 24 hours? No. ?5.Pt's weight >40kg  Yes.   ?6. Abnormal electrolyte(s): K+ 2.7, Mag 1.8  ?7. Electrolytes replaced per protocol ?8.  Call MD STAT for K+ </= 2.5, Phos </= 1, or Mag </= 1 ?Physician:  Dr Warrick Parisian ? ?Lolita Lenz 02/19/2022 5:26 AM  ?

## 2022-02-19 NOTE — Progress Notes (Signed)
?    [facility]   ? ?Date of Admission:  01/22/2022   Total days of antibiotics 4/vanco/cefepime/metro ?        ? ? ?ID: [name] is a 51 y.o. male with  pelvic osteo ?Principal Problem: ?  AMS (altered mental status) ? ? ? ?Subjective: ?Afebrile, more alert. Evaluated by speech pathology. Now on midodrine for BP support ? ?Medications:  ? chlorhexidine  15 mL Mouth Rinse BID  ? Chlorhexidine Gluconate Cloth  6 each Topical Daily  ? dextrose  1 ampule Intravenous Once  ? doxycycline  100 mg Oral Q12H  ? heparin  5,000 Units Subcutaneous Q8H  ? mouth rinse  15 mL Mouth Rinse q12n4p  ? midazolam  2 mg Intravenous Once  ? midodrine  5 mg Oral TID WC  ? sodium chloride flush  10-40 mL Intracatheter Q12H  ? sodium hypochlorite   Topical BID  ? ? ?Objective: ?Vital signs in last 24 hours: ?Temp:  [96.3 ?F (35.7 ?C)-97.9 ?F (36.6 ?C)] 96.3 ?F (35.7 ?C) (05/01 1120) ?Pulse Rate:  [61-86] 63 (05/01 1500) ?Resp:  [12-28] 16 (05/01 1500) ?BP: (66-143)/(36-107) 101/59 (05/01 1500) ?SpO2:  [95 %-100 %] 98 % (05/01 1500) ?Weight:  [65.2 kg] 65.2 kg (05/01 0433) ? ?Physical Exam  ?Constitutional: He is oriented to person, place, and time. He appears chronically ill and under-nourished. No distress.  ?HENT: bitemporal wasting ?Mouth/Throat: Oropharynx is clear and moist. No oropharyngeal exudate.  ?Cardiovascular: Normal rate, regular rhythm and normal heart sounds. Exam reveals no gallop and no friction rub.  ?No murmur heard.  ?Pulmonary/Chest: Effort normal and breath sounds normal. No respiratory distress. He has no wheezes.  ?Abdominal: Soft. Bowel sounds are normal. He exhibits no distension. There is no tenderness.  ?Lymphadenopathy:  ?He has no cervical adenopathy.  ?Neurological: He is alert and oriented to person, place, and time.  ?Skin: Skin is warm and dry. No rash noted. No erythema.  ?Ext: swelling to right arm (picc line is clean dry and intact) ? ? ?Lab Results ?Recent Labs  ?   02/18/22 ?0703 02/19/22 ?0428  ?WBC 10.2 7.1  ?HGB 7.5* 7.6*  ?HCT 21.9* 23.3*  ?NA 138 141  ?K 2.7* 2.7*  ?CL 107 113*  ?CO2 26 25  ?BUN 11 8  ?CREATININE 0.62 0.59*  ? ?Liver Panel ?No results for input(s): PROT, ALBUMIN, AST, ALT, ALKPHOS, BILITOT, BILIDIR, IBILI in the last 72 hours. ?Sedimentation Rate ?No results for input(s): ESRSEDRATE in the last 72 hours. ?C-Reactive Protein ?Recent Labs  ?  02/05/2022 ?2227  ?CRP 16.5*  ? ? ?Microbiology: ?reviewed ?Studies/Results: ?No results found. ? ? ?Assessment/Plan: ?Pelvic osteomyelitis = WBC is trending down. Will change vanco to doxy but keep iv cefepime and metronidazole for now. Anticipate that we treat with IV abtx  x 2 weeks then convert to orals for course of chronic osteo. Will check sed rate ? ?Malnutrition = continue with protein supplementation ? ?Pelvic wound/pressure ulcer = continue with bid dressing per wound care but also ensure to do off load ? ?[name] ?[facility] ?Pager: [phone] ? ?02/19/2022, 3:48 PM ? ? ? ? ? ?

## 2022-02-19 DEATH — deceased

## 2022-02-20 ENCOUNTER — Other Ambulatory Visit: Payer: Self-pay

## 2022-02-20 DIAGNOSIS — R6521 Severe sepsis with septic shock: Secondary | ICD-10-CM

## 2022-02-20 DIAGNOSIS — M869 Osteomyelitis, unspecified: Secondary | ICD-10-CM

## 2022-02-20 DIAGNOSIS — A419 Sepsis, unspecified organism: Secondary | ICD-10-CM

## 2022-02-20 LAB — BASIC METABOLIC PANEL
Anion gap: 4 — ABNORMAL LOW (ref 5–15)
BUN: 8 mg/dL (ref 6–20)
CO2: 25 mmol/L (ref 22–32)
Calcium: 8.3 mg/dL — ABNORMAL LOW (ref 8.9–10.3)
Chloride: 111 mmol/L (ref 98–111)
Creatinine, Ser: 0.6 mg/dL — ABNORMAL LOW (ref 0.61–1.24)
GFR, Estimated: >60 mL/min (ref >60)
Glucose, Bld: 104 mg/dL — ABNORMAL HIGH (ref 70–99)
Potassium: 3.4 mmol/L — ABNORMAL LOW (ref 3.5–5.1)
Sodium: 140 mmol/L (ref 135–145)

## 2022-02-20 LAB — CBC
HCT: 23.1 % — ABNORMAL LOW (ref 39.0–52.0)
Hemoglobin: 7.5 g/dL — ABNORMAL LOW (ref 13.0–17.0)
MCH: 29.9 pg (ref 26.0–34.0)
MCHC: 32.5 g/dL (ref 30.0–36.0)
MCV: 92 fL (ref 80.0–100.0)
Platelets: 179 10*3/uL (ref 150–400)
RBC: 2.51 MIL/uL — ABNORMAL LOW (ref 4.22–5.81)
RDW: 16.9 % — ABNORMAL HIGH (ref 11.5–15.5)
WBC: 9.1 10*3/uL (ref 4.0–10.5)
nRBC: 0.2 % (ref 0.0–0.2)

## 2022-02-20 LAB — GLUCOSE, CAPILLARY
Glucose-Capillary: 127 mg/dL — ABNORMAL HIGH (ref 70–99)
Glucose-Capillary: 95 mg/dL (ref 70–99)
Glucose-Capillary: 94 mg/dL (ref 70–99)
Glucose-Capillary: 74 mg/dL (ref 70–99)

## 2022-02-20 LAB — SEDIMENTATION RATE: Sed Rate: 45 mm/hr — ABNORMAL HIGH (ref 0–16)

## 2022-02-20 LAB — MAGNESIUM: Magnesium: 1.9 mg/dL (ref 1.7–2.4)

## 2022-02-20 LAB — PHOSPHORUS: Phosphorus: 3.1 mg/dL (ref 2.5–4.6)

## 2022-02-20 MED ORDER — SODIUM CHLORIDE 0.9 % IV SOLN
2.0000 g | INTRAVENOUS | Status: DC
  Administered 2022-02-20 – 2022-03-02 (×11): 2 g via INTRAVENOUS
  Filled 2022-02-20 (×11): qty 20

## 2022-02-20 MED ORDER — FOOD THICKENER (SIMPLYTHICK): 1.0000 | ORAL | Status: DC | PRN

## 2022-02-20 MED ORDER — ACETAMINOPHEN 325 MG PO TABS
650.0000 mg | ORAL_TABLET | ORAL | Status: AC | PRN
  Administered 2022-02-20: 650 mg via ORAL
  Filled 2022-02-20: qty 2

## 2022-02-20 MED ORDER — POTASSIUM CHLORIDE 20 MEQ PO PACK
40.0000 meq | PACK | Freq: Two times a day (BID) | ORAL | Status: DC
  Administered 2022-02-20: 40 meq via ORAL
  Filled 2022-02-20 (×2): qty 2

## 2022-02-20 MED ORDER — METRONIDAZOLE 500 MG PO TABS
500.0000 mg | ORAL_TABLET | Freq: Two times a day (BID) | ORAL | Status: DC
  Administered 2022-02-20 – 2022-02-22 (×4): 500 mg via ORAL
  Filled 2022-02-20 (×4): qty 1

## 2022-02-20 MED ORDER — SODIUM CHLORIDE 0.9 % IV SOLN
100.0000 mg | Freq: Two times a day (BID) | INTRAVENOUS | Status: DC
  Filled 2022-02-20: qty 100

## 2022-02-20 MED ORDER — POTASSIUM CHLORIDE CRYS ER 20 MEQ PO TBCR: 40.0000 meq | EXTENDED_RELEASE_TABLET | Freq: Two times a day (BID) | ORAL | Status: DC

## 2022-02-20 MED ORDER — PANTOPRAZOLE SODIUM 40 MG IV SOLR
40.0000 mg | INTRAVENOUS | Status: DC
  Administered 2022-02-20 – 2022-02-24 (×5): 40 mg via INTRAVENOUS
  Filled 2022-02-20 (×5): qty 10
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
## 2022-02-20 NOTE — Progress Notes (Signed)
?  Transition of Care (TOC) Screening Note ? ? ?Patient Details  ?Name: [name] ?Date of Birth: 23-Dec-1970 ? ? ?Transition of Care (TOC) CM/SW Contact:    ?Jenesys Casseus, LCSW ?Phone Number: ?02/20/2022, 11:00 AM ? ? ? ?Transition of Care Department [facility]) has reviewed patient and no TOC needs have been identified at this time. We will continue to monitor patient advancement through interdisciplinary progression rounds. If new patient transition needs arise, please place a TOC consult. ? ? ?

## 2022-02-20 NOTE — Consult Note (Signed)
WOC Nurse Consult Note: ?Patient receiving care in [facility] ICU 1225. Patient was incontinent of moderate amount of soft brown stool at time of my assessment. ?Reason for Consult: chronic wounds ?Wound type: ?There is a right trochanter wound that is 75% yellow and black necrotic material and 25% pink. It measures 5 cm x 6 cm x unknown depth. ?The sacrum is wound free. A foam dressing covers the area. ?The left trochanter is barely open and measures 4 cm x 0.8 cm x 1 cm and it is putting out a heavy amount of tan drainage. The wound edges are macerated. ?The left ischium has a stage 4 PI that is primarily pink with a small area of black along the border. It measures 8 cm x 10.5 cm x 9 cm. It has heavy tan drainage also. ?The right ischium has a stage 2 PI that measures 0.8 cm x 3 cm and is pink with a tiny spot of darkened tissue in the center. No drainage. ?Pressure Injury POA: Yes ?Measurement: ?Wound bed: ?Drainage (amount, consistency, odor)  ?Periwound: intact, scarred/contracted ?Dressing procedure/placement/frequency: ?I modified my original [place]'s order from yesterday to reflect the actual involvement of the LEFT ischium, not the sacrum. ? ?Thank you for the consult.  Discussed plan of care with the patient and bedside nurse.  [place] nurse will not follow at this time.  Please re-consult the [place] team if needed. ? ?Val Riles, RN, MSN, CWOCN, CNS-BC, pager [phone]  ?  ?

## 2022-02-20 NOTE — Progress Notes (Signed)
PHARMACY CONSULT NOTE FOR: ? ?OUTPATIENT  PARENTERAL ANTIBIOTIC THERAPY (OPAT) ? ?Indication: Pelvic osteo ?Regimen: Rocephin 2g IV every 24 hours and Metronidazole 500 mg po every 12 hours ?End date: 03/16/22 ? ?IV antibiotic discharge orders are pended. ?To discharging provider:  please sign these orders via discharge navigator,  ?Select New Orders & click on the button choice - Manage This Unsigned Work.  ?  ? ?Thank you for allowing pharmacy to be a part of this patient?s care. ? ?Alycia Rossetti, PharmD, BCPS ?Infectious Diseases Clinical Pharmacist ?02/20/2022 1:38 PM  ? ?**Pharmacist phone directory can now be found on [email or web address] ([email or web address]).  Listed under [place].  ?

## 2022-02-20 NOTE — Progress Notes (Signed)
Nutrition Follow-up ? ?DOCUMENTATION CODES:  ? ?Non-severe (moderate) malnutrition in context of chronic illness ? ?INTERVENTION:  ?- continue Ensure Plus High Protein BID and Juven BID. ? ?- if within [place], recommend small bore NGT and initiation of TF in order to consistently meet estimated nutrition needs for wound healing.  ? ? ?NUTRITION DIAGNOSIS:  ? ?Moderate Malnutrition related to chronic illness as evidenced by moderate fat depletion, moderate muscle depletion, severe muscle depletion. -ongoing ? ?GOAL:  ? ?Patient will meet greater than or equal to 90% of their needs -unmet ? ?MONITOR:  ? ?PO intake, Supplement acceptance, Diet advancement, Labs, Weight trends, Skin ? ?ASSESSMENT:  ? ?51 yo male admitted with hypotension, recurrent sepsis d/t left ischial osteomyelitis and infected decubitus ulcer. PMH includes staph epidermidis bacteremia, SCI, quadriplegia, sacral osteomyelitis, neurogenic bladder, seizures, tobacco use, L AKA, R BKA. ? ?Patient discussed in rounds and one-on-one with RN and with SLP. RN shares suspected aspiration this AM and that d/t this, patient did not receive 10 AM medications. ? ?Patient's brother at bedside at the time of RD visit. This RD saw patient on 4/24, during previous admission, and he was assessed remotely by another RD on 4/29. ? ?Patient became disgruntled during [place] despite RD introducing self and requested permission to complete NFPE; flow sheet documentation indicates that he is a/o to self only.  ? ?SLP shared that patient was not interested in PO intake during her assessment. RD offered patient items from bedside table; he declined.  ? ?Weight today is +4 lb compared to weight on 4/30. Moderate pitting edema to RUE documented in the edema section of flow sheet.  ? ?Per notes: ?- cervical cord injury with resultant quadriplegia ?- acute metabolic encephalopathy ?- septic and hypovolemic shock 2/2 L pelvic osteomyelitis  ? ? ?Labs reviewed; CBGs: 127, 95, 94 mg/dl,  K: 3.4 mmol/l, creatinine: 0.6 mg/dl, Ca: 8.3 mg/dl. ? ?Medications reviewed; 1 tablet multivitamin with minerals/day, 40 mEq Klor-Con BID, 10 mEq IV KCl x8 runs 5/1. ? ?IVF; D5-LR @ 75 ml/hr (306 kcal/24 hrs).  ?  ? ?NUTRITION - FOCUSED PHYSICAL EXAM: ? ?Flowsheet Row Most Recent Value  ?Orbital Region Moderate depletion  ?Upper Arm Region Moderate depletion  ?Thoracic and Lumbar Region Unable to assess  ?Buccal Region Moderate depletion  ?Temple Region Moderate depletion  ?Clavicle Bone Region Severe depletion  ?Clavicle and Acromion Bone Region Severe depletion  ?Scapular Bone Region Moderate depletion  ?Dorsal Hand Mild depletion  ?Patellar Region Unable to assess  [bilateral AKA]  ?Anterior Thigh Region Moderate depletion  ?Posterior Calf Region Unable to assess  [bilateral AKA]  ?Edema (RD Assessment) None  ?Hair Reviewed  ?Eyes Reviewed  ?Mouth Unable to assess  ?Skin Reviewed  ?Nails Reviewed  ? ?  ? ? ?Diet Order:   ?Diet Order   ? ?       ?  Diet full liquid Room service appropriate? No; Fluid consistency: Nectar Thick  Diet effective now       ?  ? ?  ?  ? ?  ? ? ?EDUCATION NEEDS:  ? ?Not appropriate for education at this time ? ?Skin:  Skin Integrity Issues:: Stage II, DTI, Stage IV ?DTI: R hip ?Stage II: R buttocks ?Stage IV: L ischial tuberosity, L hip x 2 ? ?Last BM:  5/1 (type 5 x1, large amount; type 6 x1, large amount) ? ?Height:  ? ?Ht Readings from Last 1 Encounters:  ?01/26/2022 5\' 3"  (1.6 m)  ? ? ?Weight:  ? ?  Wt Readings from Last 1 Encounters:  ?02/20/22 67.4 kg  ? ? ?BMI:  Body mass index is 26.32 kg/m?. ? ?Estimated Nutritional Needs:  ?Kcal:  2000-2200 ?Protein:  100-120 gm ?Fluid:  2.2-2.4 L ? ? ? ? ?Jarome Matin, MS, RD, LDN ?Registered Dietitian II ?Inpatient Clinical Nutrition ?RD pager # and on-call/weekend pager # available in [place]  ? ?

## 2022-02-20 NOTE — Progress Notes (Signed)
? ? ?[facility] for Infectious Disease ? ?Date of Admission:  02/02/2022    ? ?Total days of antibiotics 16 ?        ?ASSESSMENT: ? ?[name] has ongoing osteomyelitis with most recent imaging showing decreased size of upper thigh fluid collection that was previously treated with oral antibiotics. No history of MRSA noted and will plan for 4 weeks of ceftriaxone and with oral metronidazole given his decreased oral intake. Will need to optimize nutritional intake and off load site to allow for healing. It is unlikely that this wound is going to heal without a flap procedure. OPAT orders placed. Will attempt to arrange virtual visit for follow up. Continue remaining medical and supportive care per primary team.  ?   ?PLAN: ? ?Change antibiotics to ceftriaxone and oral metronidazole.  ?Continue wound care with frequent turns and optimize nutrition. ?OPAT orders placed.  ?Will arrange virtual follow up in ID clinic.  ?Remaining medical and supportive care per primary team.  ? ? ?Diagnosis: ?Osteomyelitis  ? ?Culture Result: Culture negative  ? ?Allergies  ?Allergen Reactions  ? Baclofen Other (See Comments)  ?  Can cause seizures, since has epilepsy- lowers seizure threshold  ? ? ?OPAT Orders ?Discharge antibiotics to be given via PICC line ?Discharge antibiotics: Ceftriaxone and oral metronidazole ?Per pharmacy protocol  ?Duration: ?4 weeks ?End Date: ?03/16/22 ? ?[facility] Care Per Protocol: ? ?Home health RN for IV administration and teaching; PICC line care and labs.   ? ?Labs weekly while on IV antibiotics: ?_X_ CBC with differential ?_X_ BMP ?__ CMP ?__ CRP ?_X_ ESR ?__ Vancomycin trough ?_X_ CK ? ?_X_ Please pull PIC at completion of IV antibiotics ?__ Please leave PIC in place until doctor has seen patient or been notified ? ?Fax weekly labs to [phone] ? ?Clinic Follow Up Appt: ? ?03/15/22 at 3 pm virtual visit with Dr. Baxter Flattery ? ? ?Principal Problem: ?  AMS (altered mental status) ?Active Problems: ?   Septic shock ([place]) ? ? ? chlorhexidine  15 mL Mouth Rinse BID  ? Chlorhexidine Gluconate Cloth  6 each Topical Daily  ? dextrose  1 ampule Intravenous Once  ? feeding supplement  237 mL Oral BID BM  ? heparin  5,000 Units Subcutaneous Q8H  ? mouth rinse  15 mL Mouth Rinse q12n4p  ? metroNIDAZOLE  500 mg Oral Q12H  ? midazolam  2 mg Intravenous Once  ? midodrine  5 mg Oral TID WC  ? multivitamin with minerals  1 tablet Oral Daily  ? nutrition supplement (JUVEN)  1 packet Oral BID BM  ? pantoprazole (PROTONIX) IV  40 mg Intravenous Q24H  ? potassium chloride  40 mEq Oral BID  ? sodium chloride flush  10-40 mL Intracatheter Q12H  ? sodium hypochlorite   Topical BID  ? ? ?SUBJECTIVE: ? ?Afebrile overnight with no acute events. No new concerns/complaints. ? ?Allergies  ?Allergen Reactions  ? Baclofen Other (See Comments)  ?  Can cause seizures, since has epilepsy- lowers seizure threshold  ? ? ? ?Review of Systems: ?Review of Systems  ?Constitutional:  Negative for chills, fever and weight loss.  ?Respiratory:  Negative for cough, shortness of breath and wheezing.   ?Cardiovascular:  Negative for chest pain and leg swelling.  ?Gastrointestinal:  Negative for abdominal pain, constipation, diarrhea, nausea and vomiting.  ?Skin:  Negative for rash.  ? ? ? ?OBJECTIVE: ?Vitals:  ? 02/20/22 1230 02/20/22 1245 02/20/22 1300 02/20/22 1400  ?BP: 107/78 Marland Kitchen)  88/51 (!) 93/54 (!) 100/57  ?Pulse: 65 66 63 66  ?Resp: (!) _0 ?Temp:      ?TempSrc:      ?SpO2: 96% 98% 97% 97%  ?Weight:      ?Height:      ? ?Body mass index is 26.32 kg/m?. ? ?Physical Exam ?Constitutional:   ?   General: He is not in acute distress. ?   Appearance: He is well-developed.  ?   Comments: Lying in bed with head of bed elevated; pleasant.   ?Cardiovascular:  ?   Rate and Rhythm: Normal rate and regular rhythm.  ?   Heart sounds: Normal heart sounds.  ?Pulmonary:  ?   Effort: Pulmonary effort is normal.  ?   Breath sounds: Normal breath sounds.   ?Skin: ?   General: Skin is warm and dry.  ?Neurological:  ?   Mental Status: He is alert.  ? ? ?Lab Results ?Lab Results  ?Component Value Date  ? WBC 9.1 02/20/2022  ? HGB 7.5 (L) 02/20/2022  ? HCT 23.1 (L) 02/20/2022  ? MCV 92.0 02/20/2022  ? PLT 179 02/20/2022  ?  ?Lab Results  ?Component Value Date  ? CREATININE 0.60 (L) 02/20/2022  ? BUN 8 02/20/2022  ? NA 140 02/20/2022  ? K 3.4 (L) 02/20/2022  ? CL 111 02/20/2022  ? CO2 25 02/20/2022  ?  ?Lab Results  ?Component Value Date  ? ALT 10 02/07/2022  ? AST 26 02/11/2022  ? ALKPHOS 120 01/22/2022  ? BILITOT 0.8 01/27/2022  ?  ? ?Microbiology: ?Recent Results (from the past 240 hour(s))  ?Culture, blood (Routine X 2) w Reflex to ID Panel     Status: Abnormal  ? Collection Time: 02/11/22  9:27 AM  ? Specimen: BLOOD  ?Result Value Ref Range Status  ? Specimen Description   Final  ?  BLOOD BLOOD LEFT HAND ?Performed at [facility], [place] [address] [place], [place] [address] ?  ? Special Requests IN PEDIATRIC BOTTLE Blood Culture adequate volume  Final  ? Culture  Setup Time   Final  ?  GRAM POSITIVE COCCI ?IN PEDIATRIC BOTTLE ?CRITICAL VALUE NOTED.  VALUE IS CONSISTENT WITH PREVIOUSLY REPORTED AND CALLED VALUE. ?CRITICAL RESULT CALLED TO, READ BACK BY AND VERIFIED WITH: PHARMD M.SWAYNE AT 0845 ON 02/12/2022 BY T.SAAD. ?  ? Culture (A)  Final  ?  STAPHYLOCOCCUS EPIDERMIDIS ?THE SIGNIFICANCE OF ISOLATING THIS ORGANISM FROM A SINGLE SET OF BLOOD CULTURES WHEN MULTIPLE SETS ARE DRAWN IS UNCERTAIN. PLEASE NOTIFY THE MICROBIOLOGY DEPARTMENT WITHIN ONE WEEK IF SPECIATION AND SENSITIVITIES ARE REQUIRED. ?Performed at [facility], [place] [address], [place], [place] [address] ?  ? Report Status 02/14/2022 FINAL  Final  ?Culture, blood (Routine X 2) w Reflex to ID Panel     Status: None  ? Collection Time: 02/11/22  9:27 AM  ? Specimen: BLOOD  ?Result Value Ref Range Status  ? Specimen Description   Final  ?  BLOOD LEFT ANTECUBITAL ?Performed at [facility], [place] [address], [place], [place] [address] ?  ? Special Requests IN PEDIATRIC BOTTLE Blood Culture adequate volume  Final  ? Culture   Final  ?  NO GROWTH 5 DAYS ?Performed at [facility], [place] [address], [place], [place] [address] ?  ? Report Status 02/10/2022 FINAL  Final  ?Blood Culture (routine x 2)     Status: None (Preliminary result)  ?  Collection Time: 02/07/2022  2:17 PM  ? Specimen: BLOOD  ?Result Value Ref Range Status  ? Specimen Description   Final  ?  BLOOD ?Performed at [facility], [place] [address] [place], [place] [address] ?  ? Special Requests   Final  ?  BOTTLES DRAWN AEROBIC AND ANAEROBIC Blood Culture results may not be optimal due to an inadequate volume of blood received in culture bottles ?Performed at [facility], [place] [address] [place], [place] [address] ?  ? Culture   Final  ?  NO GROWTH 3 DAYS ?Performed at [facility], [place] [address] [place], [place] [address] ?  ? Report Status PENDING  Incomplete  ?Urine Culture     Status: None  ? Collection Time: 02/15/2022  2:45 PM  ? Specimen: In/Out Cath Urine  ?Result Value Ref Range Status  ? Specimen Description   Final  ?  IN/OUT CATH URINE ?Performed at [facility], [place] [address], [place], [place] [address] ?  ? Special Requests   Final  ?  NONE ?Performed at [facility], [place] [address] [place], [place] [address] ?  ? Culture   Final  ?  NO GROWTH ?Performed at [facility], [place] [address], [place], [place] [address] ?  ? Report Status 02/17/2022 FINAL  Final  ?MRSA Next Gen by PCR, Nasal     Status: None  ? Collection Time: 01/20/2022  9:30 PM  ? Specimen: Nasal Mucosa; Nasal Swab  ?Result Value Ref Range Status  ? MRSA by PCR Next Gen NOT DETECTED NOT DETECTED Final  ?  Comment: (NOTE) ?The GeneXpert MRSA Assay (FDA approved for NASAL specimens only), ?is one component of a comprehensive MRSA colonization  surveillance ?program. It is not intended to diagnose MRSA infection nor to guide ?or monitor treatment for MRSA infections. ?Test performance is not FDA approved in patients less than 2 years ?old. ?Performed at [facility]

## 2022-02-20 NOTE — Plan of Care (Signed)
Discussed with patient plan of care for the evening, pain management, importance of blood pressures and taking medications to help prevent seizures with no teach back displayed.  Patient spit part of his potassium medications out tonight. ? ?Problem: Health Behavior/Discharge Planning: ?Goal: Ability to manage health-related needs will improve ?Outcome: Not Progressing ?  ?

## 2022-02-20 NOTE — Progress Notes (Signed)
? ?NAME:  [name], MRN:  [id number], DOB:  December 01, 1970, LOS: 4 ?ADMISSION DATE:  02/18/2022, CONSULTATION DATE:  02/12/2022 ?REFERRING MD:  Dr. Rogene Houston, CHIEF COMPLAINT:  Hypotension with concern for sepsis  ? ?History of Present Illness:  ?[name] is a 51yo male with PMH significant for spinal cord injury, quadriplegia, osteomyelitis of sacrum, neurogenic bladder requiring in and out caths, seizures, and prior wound dehiscence who presented to the [facility] ED via EMS with reports of hypotension. Of note patient was just discharged from this facility 2 days prior for management of sepsis with bacteremia secondary to osteomyelitis.  ? ?On ED arrival patient was seen hypotensive with all other vitals WNL. Lab work significant for NA 131, glucose 42, creatinine 0.53, albumin <1.5, Hgb 8.5, lactic normal at 1.5, INR 1.4. Given hypotension with concern for need of pressor support PCCM consulted for further management and admission   ? ?Pertinent  Medical History  ?spinal cord injury, quadriplegia, osteomyelitis of sacrum, neurogenic bladder requiring in and out caths, seizures, and prior wound dehiscence ? ?Significant Hospital Events: ?Including procedures, antibiotic start and stop dates in addition to other pertinent events   ?4/28 patient presented to ED after home health nurse identified hypotension.  Concern for recurrent sepsis.  Of note patient was just discharged from this facility 2 days prior ?4/29 PICC line placed ?5/1 still on levophed. Passed SLP swallow eval for mech soft diet.  ? ?Interim History / Subjective:  ? ?No acute events overnight. Passes SLP eval for mech soft. Levophed slowly weaning down.  ? ?Family has POA (mother and brother listed as well they insist we do not ask him many questions because his answers sound reliable, but are not.  ? ?Objective   ?Blood pressure 93/69, pulse 62, temperature 97.8 ?F (36.6 ?C), temperature source Axillary, resp. rate 15, height 5\' 3"  (1.6  m), weight 67.4 kg, SpO2 96 %. ?   ?   ? ?Intake/Output Summary (Last 24 hours) at 02/20/2022 0809 ?Last data filed at 02/20/2022 0605 ?Gross per 24 hour  ?Intake 3337.83 ml  ?Output 1425 ml  ?Net 1912.83 ml  ? ? ?Filed Weights  ? 02/18/22 0500 02/19/22 0433 02/20/22 0500  ?Weight: 65.4 kg 65.2 kg 67.4 kg  ? ? ?Examination:  ?General: cachectic middle aged male ?HEENT: [place]/AT, PERRL, no JVD ?Neuro: Awake, alert, oriented to self ?CV: RRR, no MRG ?PULM: Clear bilateral breath sounds ?GI: Soft, non-tender, non-distended ?Extremities: Bilateral lower extremity amputation remote.  ?Skin: Wounds to the R trochanter, L and R ischium, and sacrum.  ? ?See end of note for detailed wound assessment.  ? ?Resolved Hospital Problem list   ?Lactic Acidosis ? ?Assessment & Plan:  ? ?Shock Multifactorial: Septic and hypovolemic  ?Sepsis secondary to left pelvic osteomyelitis from chronic pressure wounds to bilateral ischium and trochanters POA ?Previous Staph epi bacteremia 4/23 ?- Continue Cefepime, flagyl per ID ?- Vanco changed to doxy ?- IV abx x 2 weeks then convert to orals.  ?- Cultures pending ?- Continue levophed for MAP goal > 65 ?- Cortisol 19, defer steroids ?- Midodrine, can increase but he has only had 2 doses so far. Levo weaning.  ?- Wound care consult placed  ? ?Acute Metabolic Encephalopathy ?Hx of Seizures ?In setting of hypoglycemia and sepsis ?Family reports he is close to his baseline.  ?- Continue home Depakote ?- Maintain neuro protective measures ?- Seizure precautions  ?- Aspiration precautions  ? ?Hypoglycemia ?In setting of poor oral intake  and sepsis ?- Continue D5LR ?- q4hr POC CBGs  ?- Now that he is taking PO we can work towards discontinuing this.  ? ?Cervical cord injury ?Quadriplegia ?Repeat CT abdomen and pelvis 4/28 revealed persistent decubitus ulceration with underlying osteomyelitis ?- ID consult as above ?- Wound care ?- Abx as above ?- Pressure alleviating devices ? ?Difficult IV Access ?- PICC  line in place ? ?History of right BKA and left AKA ?-Patient utilizes wheelchair at baseline ?P: ?PT/OT when appropriate ? ?Severe protein calorie malnutrition ?Albumin less than 1.5 on admission ?- Approved for dys 3 diet by SLP on 5/1 ?- Supplement protein as able per RD recommendations ? ?Chronic Anemia ?- monitor ?- transfuse for hemoglobin < 7g/dL ? ? ?Best Practice (right click and "Reselect all SmartList Selections" daily)  ? ?Diet/type: Regular consistency (see orders) ?DVT prophylaxis: prophylactic heparin  ?GI prophylaxis: PPI ?Lines: Central line PICC ?Foley:  N/A and Yes, and it is still needed ?Code Status:  full code ?Last date of multidisciplinary goals of care discussion: Pending  ? ?Labs   ?CBC: ?Recent Labs  ?Lab 02/15/2022 ?1340 02/09/2022 ?1455 02/17/22 ?0240 02/18/22 ?0703 02/19/22 ?[id number] 02/20/22 ?0244  ?WBC 10.5  --  13.7* 10.2 7.1 9.1  ?NEUTROABS 7.3  --   --   --   --   --   ?HGB 8.5* 8.8* 7.1* 7.5* 7.6* 7.5*  ?HCT 26.5* 26.0* 21.0* 21.9* 23.3* 23.1*  ?MCV 95.0  --  89.0 90.9 91.7 92.0  ?PLT 199  --  223 240 210 179  ? ? ? ?Basic Metabolic Panel: ?Recent Labs  ?Lab 02/05/2022 ?1340 02/13/2022 ?1455 02/17/22 ?0240 02/18/22 ?0703 02/19/22 ?0428 02/19/22 ?1803 02/20/22 ?0244  ?NA 131* 134* 133* 138 141  --  140  ?K 3.4* 3.7 3.6 2.7* 2.7* 3.8 3.4*  ?CL 101 99 104 107 113*  --  111  ?CO2 22  --  22 26 25   --  25  ?GLUCOSE 42* 43* 78 146* 99  --  104*  ?BUN 20 22* 19 11 8   --  8  ?CREATININE 0.53* 0.60* 0.62 0.62 0.59*  --  0.60*  ?CALCIUM 8.5*  --  8.5* 8.6* 8.6*  --  8.3*  ?MG  --   --  1.4* 2.0 1.8  --  1.9  ?PHOS  --   --  4.5  --   --   --  3.1  ? ? ?GFR: ?Estimated Creatinine Clearance: 89.9 mL/min (A) (by C-G formula based on SCr of 0.6 mg/dL (L)). ?Recent Labs  ?Lab 01/21/2022 ?1340 02/08/2022 ?1830 02/17/22 ?0240 02/18/22 ?0703 02/19/22 ?[id number] 02/20/22 ?0244  ?WBC 10.5  --  13.7* 10.2 7.1 9.1  ?LATICACIDVEN 1.5 4.1* 1.4  --   --   --   ? ? ? ?Liver Function Tests: ?Recent Labs  ?Lab 01/28/2022 ?1340   ?AST 26  ?ALT 10  ?ALKPHOS 120  ?BILITOT 0.8  ?PROT 6.9  ?ALBUMIN <1.5*  ? ? ?No results for input(s): LIPASE, AMYLASE in the last 168 hours. ?No results for input(s): AMMONIA in the last 168 hours. ? ?ABG ?   ?Component Value Date/Time  ? PHART 7.516 (H) 01/01/2020 1214  ? PCO2ART 36.5 01/01/2020 1214  ? PO2ART 353.0 (H) 01/01/2020 1214  ? HCO3 29.5 (H) 01/01/2020 1214  ? TCO2 26 02/04/2022 1455  ? O2SAT 100.0 01/01/2020 1214  ? ?  ? ?Coagulation Profile: ?Recent Labs  ?Lab 02/17/2022 ?1340  ?INR 1.4*  ? ? ? ?Cardiac Enzymes: ?  No results for input(s): CKTOTAL, CKMB, CKMBINDEX, TROPONINI in the last 168 hours. ? ?HbA1C: ?No results found for: HGBA1C ? ?CBG: ?Recent Labs  ?Lab 02/19/22 ?1533 02/19/22 ?1923 02/19/22 ?2349 02/20/22 ?[id number] 02/20/22 ?0802  ?GLUCAP 89 109* 121* 127* 95  ? ? ?Critical care time: 35 minutes  ? ? ?Georgann Housekeeper, AGACNP-BC ?[facility] ? ?See [place] for personal pager ?PCCM on call pager [phone] until 7pm. ?Please call Elink 7p-7a. [phone] ? ?02/20/2022 8:09 AM ? ? ? ?Wounds ? ?Pressure Injury 12/17/21 Hip Anterior;Left;Lateral Stage 4 - Full thickness tissue loss with exposed bone, tendon or muscle. (Active)  ?Date First Assessed/Time First Assessed: 12/17/21 1800   Location: Hip  Location Orientation: Anterior;Left;Lateral  Staging: Stage 4 - Full thickness tissue loss with exposed bone, tendon or muscle.  Present on Admission: Yes  ?  ?Assessments 12/17/2021  6:00 PM 02/19/2022  9:00 PM  ?Dressing Type Foam - Lift dressing to assess site every shift;Gauze (Comment);Moist to dry;Other (Comment) Moist to dry;Foam - Lift dressing to assess site every shift  ?Dressing Clean, Dry, Intact Changed  ?Dressing Change Frequency Daily Twice a day  ?State of Healing -- Non-healing  ?Site / Wound Assessment Yellow Pink;Painful  ?Wound Length (cm) 4 cm --  ?Wound Width (cm) 2 cm --  ?Wound Depth (cm) 1 cm --  ?Wound Surface Area (cm^2) 8 cm^2 --  ?Wound Volume (cm^3) 8 cm^3 --   ?Drainage Amount Minimal --  ?Drainage Description Serous;Purulent --  ?Treatment Cleansed;Packing (Saline gauze) Cleansed;Off loading;Packing (Saline gauze)  ?   ?No Linked orders to display  ?   ?Pressu

## 2022-02-21 ENCOUNTER — Inpatient Hospital Stay (HOSPITAL_COMMUNITY): Payer: Medicare Other

## 2022-02-21 ENCOUNTER — Encounter (HOSPITAL_COMMUNITY): Payer: Self-pay | Admitting: Pulmonary Disease

## 2022-02-21 ENCOUNTER — Encounter (HOSPITAL_BASED_OUTPATIENT_CLINIC_OR_DEPARTMENT_OTHER): Payer: Medicare Other | Admitting: Physician Assistant

## 2022-02-21 DIAGNOSIS — E878 Other disorders of electrolyte and fluid balance, not elsewhere classified: Secondary | ICD-10-CM

## 2022-02-21 LAB — GLUCOSE, CAPILLARY
Glucose-Capillary: 79 mg/dL (ref 70–99)
Glucose-Capillary: 72 mg/dL (ref 70–99)
Glucose-Capillary: 62 mg/dL — ABNORMAL LOW (ref 70–99)
Glucose-Capillary: 96 mg/dL (ref 70–99)
Glucose-Capillary: 81 mg/dL (ref 70–99)
Glucose-Capillary: 133 mg/dL — ABNORMAL HIGH (ref 70–99)
Glucose-Capillary: 64 mg/dL — ABNORMAL LOW (ref 70–99)

## 2022-02-21 LAB — BASIC METABOLIC PANEL
Anion gap: 2 — ABNORMAL LOW (ref 5–15)
BUN: 9 mg/dL (ref 6–20)
CO2: 27 mmol/L (ref 22–32)
Calcium: 8 mg/dL — ABNORMAL LOW (ref 8.9–10.3)
Chloride: 110 mmol/L (ref 98–111)
Creatinine, Ser: 0.58 mg/dL — ABNORMAL LOW (ref 0.61–1.24)
GFR, Estimated: >60 mL/min (ref >60)
Glucose, Bld: 82 mg/dL (ref 70–99)
Potassium: 3.3 mmol/L — ABNORMAL LOW (ref 3.5–5.1)
Sodium: 139 mmol/L (ref 135–145)

## 2022-02-21 LAB — CBC
HCT: 23.3 % — ABNORMAL LOW (ref 39.0–52.0)
Hemoglobin: 7.5 g/dL — ABNORMAL LOW (ref 13.0–17.0)
MCH: 29.5 pg (ref 26.0–34.0)
MCHC: 32.2 g/dL (ref 30.0–36.0)
MCV: 91.7 fL (ref 80.0–100.0)
Platelets: 153 10*3/uL (ref 150–400)
RBC: 2.54 MIL/uL — ABNORMAL LOW (ref 4.22–5.81)
RDW: 17.3 % — ABNORMAL HIGH (ref 11.5–15.5)
WBC: 7.3 10*3/uL (ref 4.0–10.5)
nRBC: 0.4 % — ABNORMAL HIGH (ref 0.0–0.2)

## 2022-02-21 LAB — CULTURE, BLOOD (ROUTINE X 2): Culture: NO GROWTH

## 2022-02-21 LAB — PHOSPHORUS: Phosphorus: 3 mg/dL (ref 2.5–4.6)

## 2022-02-21 LAB — MAGNESIUM: Magnesium: 1.7 mg/dL (ref 1.7–2.4)

## 2022-02-21 MED ORDER — MAGNESIUM SULFATE 2 GM/50ML IV SOLN
2.0000 g | Freq: Once | INTRAVENOUS | Status: AC
  Administered 2022-02-21: 2 g via INTRAVENOUS
  Filled 2022-02-21: qty 50

## 2022-02-21 MED ORDER — DEXTROSE 50 % IV SOLN
INTRAVENOUS | Status: AC
  Administered 2022-02-21: 12.5 g via INTRAVENOUS
  Filled 2022-02-21: qty 50

## 2022-02-21 MED ORDER — DEXTROSE 50 % IV SOLN
12.5000 g | INTRAVENOUS | Status: AC
  Administered 2022-02-21: 12.5 g via INTRAVENOUS
  Filled 2022-02-21: qty 50

## 2022-02-21 MED ORDER — POTASSIUM CHLORIDE 10 MEQ/50ML IV SOLN
10.0000 meq | INTRAVENOUS | Status: AC
  Administered 2022-02-21 (×4): 10 meq via INTRAVENOUS
  Filled 2022-02-21 (×4): qty 50

## 2022-02-21 MED ORDER — DEXTROSE 50 % IV SOLN: 12.5000 g | Freq: Once | INTRAVENOUS | Status: AC

## 2022-02-21 MED ORDER — MIDODRINE HCL 5 MG PO TABS
10.0000 mg | ORAL_TABLET | Freq: Three times a day (TID) | ORAL | Status: DC
  Administered 2022-02-21 – 2022-02-22 (×2): 10 mg via ORAL
  Filled 2022-02-21 (×4): qty 2

## 2022-02-21 NOTE — Progress Notes (Addendum)
SLP Cancellation Note ? ?Patient Details ?Name: [name] ?MRN: [id number] ?DOB: Mar 23, 1971 ? ? ?Cancelled treatment:       Reason Eval/Treat Not Completed: Other (comment);Patient declined, no reason specified (Wet voice noted at basline and he did not cough or clear his throat on command; RN attempted to provide pt medications crushed with icecream.  RN reports male visitor tried to feed pt this am but he refused all po. Will continue efforts - defer MBS.) ?Requested Eileen Stanford to share attempts with family member/s when they arrive.   ?Brother report pt mostly only consumed liquids at home and would cough at times with thin liquids.   Engaging in palliative discussion may be helpful given pt recurrent admit, TBI and decreased po even PTA.   ? ? ?Rolena Infante, MS CCC SLP ?Acute Rehab Services ?Office [phone] ?Pager [phone] ? ? ?Chales Abrahams ?02/21/2022, 11:31 AM ?

## 2022-02-21 NOTE — TOC Progression Note (Signed)
Transition of Care (TOC) - Progression Note  ? ? ?Patient Details  ?Name: [name] ?MRN: [id number] ?Date of Birth: 26-Sep-1971 ? ?Transition of Care (TOC) CM/SW Contact  ?Golda Acre, RN ?Phone Number: ?02/21/2022, 8:01 AM ? ?Clinical Narrative:    ?Plan os for 16 days of iv abx  [facility] and [facility] are following.for home iv abx ? ? ?  ?  ? ?Expected Discharge Plan and Services ?  ?  ?  ?  ?  ?                ?  ?  ?  ?  ?  ?  ?  ?  ?  ?  ? ? ?Social Determinants of Health (SDOH) Interventions ?  ? ?Readmission Risk Interventions ?   ? View : No data to display.  ?  ?  ?  ? ? ?

## 2022-02-21 NOTE — Progress Notes (Signed)
[facility] ADULT ICU REPLACEMENT PROTOCOL ? ? ?The patient does apply for the [facility] Adult ICU Electrolyte Replacment Protocol based on the criteria listed below:  ? ?1.Exclusion criteria: TCTS patients, ECMO patients, and Dialysis patients ?2. Is GFR >/= 30 ml/min? Yes.    ?Patient's GFR today is >60 ?3. Is SCr </= 2? Yes.   ?Patient's SCr is 0.58 mg/dL ?4. Did SCr increase >/= 0.5 in 24 hours? No. ?5.Pt's weight >40kg  Yes.   ?6. Abnormal electrolyte(s): K 3.3  ?7. Electrolytes replaced per protocol ?8.  Call MD STAT for K+ </= 2.5, Phos </= 1, or Mag </= 1 ?Physician:  Warrick Parisian  d/c oral Potassium, patient refusing oral meds ? ?[facility], [name] A 02/21/2022 5:48 AM ? ?

## 2022-02-21 NOTE — Progress Notes (Addendum)
? ?NAME:  [name], MRN:  [id number], DOB:  09/08/1971, LOS: 5 ?ADMISSION DATE:  01/25/2022, CONSULTATION DATE:  02/15/2022 ?REFERRING MD:  Dr. Rogene Houston, CHIEF COMPLAINT:  Hypotension with concern for sepsis  ? ?History of Present Illness:  ?Lashad Pallanes is a 51yo male with PMH significant for spinal cord injury, quadriplegia, osteomyelitis of sacrum, neurogenic bladder requiring in and out caths, seizures, and prior wound dehiscence who presented to the [facility] ED via EMS with reports of hypotension. Of note patient was just discharged from this facility 2 days prior for management of sepsis with bacteremia secondary to osteomyelitis.  ? ?On ED arrival patient was seen hypotensive with all other vitals WNL. Lab work significant for NA 131, glucose 42, creatinine 0.53, albumin <1.5, Hgb 8.5, lactic normal at 1.5, INR 1.4. Given hypotension with concern for need of pressor support PCCM consulted for further management and admission   ? ?Pertinent  Medical History  ?spinal cord injury, quadriplegia, osteomyelitis of sacrum, neurogenic bladder requiring in and out caths, seizures, and prior wound dehiscence ? ?Significant Hospital Events: ?Including procedures, antibiotic start and stop dates in addition to other pertinent events   ?4/28 patient presented to ED after home health nurse identified hypotension.  Concern for recurrent sepsis.  Of note patient was just discharged from this facility 2 days prior ?4/29 PICC line placed ?5/1 still on levophed. Passed SLP swallow eval for mech soft diet.  ?5/2 ID recommending 4 weeks CTX and oral flagyl then f/u in ID clinic ?5/3 still on pressors. Goal SBP changed to > 90. Increased midodrine to 10 mg tid  ? ?Interim History / Subjective:  ?No events overnight  ?Still on norepi. Refusing meds from time to time ? ?Objective   ?Blood pressure 108/80, pulse 73, temperature 97.6 ?F (36.4 ?C), temperature source Oral, resp. rate (Abnormal) 21, height 5\' 3"  (1.6 m),  weight 66.7 kg, SpO2 98 %. ?   ?   ? ?Intake/Output Summary (Last 24 hours) at 02/21/2022 0905 ?Last data filed at 02/21/2022 0700 ?Gross per 24 hour  ?Intake 2408.22 ml  ?Output 975 ml  ?Net 1433.22 ml  ? ?Filed Weights  ? 02/19/22 0433 02/20/22 0500 02/21/22 0442  ?Weight: 65.2 kg 67.4 kg 66.7 kg  ? ? ?Examination:  ?General 51 YO male resting on bed. No distress ?HENT NCAT does have temporal wasting ?Pulm clear currently on room air ?Card rrr ?Abd soft  ?Ext bilateral amputations ?Neuro awake. Alert. Oriented x 3 currently but not cooperative w/ staff  ?Gu cl yellow ?Wounds left hip dressing intact.  ?Resolved Hospital Problem list   ?Lactic Acidosis ? ?Assessment & Plan:  ?Principal Problem: ?  Sepsis due to left ischial osteomyelitis and infected decubitus ulcer ?Active Problems: ?  Cervical spinal cord injury/quadriplegia/neurogenic bladder ?  History of seizure ?  Hx of right BKA and left AKA ?  Wheelchair dependence ?  Pressure ulcer of hip, stage IV ?  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  Severe protein-calorie malnutrition ([place]) ?  AMS (altered mental status) ?  Septic shock ([place]) ?  Alteration in electrolyte and fluid balance ? ? ?Shock Multifactorial: Septic and hypovolemic  ?Sepsis secondary to left pelvic osteomyelitis from chronic pressure wounds to bilateral ischium and trochanters POA ?Previous Staph epi bacteremia 4/23 ?Plan ?Cont flagyl and ceftriaxone for 4 weeks (start date 5/2) then f/u out pt ?Norepi for SBP > 90 ?Cont midodrine; increased to 10mg  tid ?Avoid volume depletion  ? ?  Acute Metabolic Encephalopathy ?Hx of Seizures ?In setting of hypoglycemia and sepsis ?Family reports he is close to his baseline.  ?Plan ?Cont depakote  ?Cont neuro protective interventions ?Sz precautions ?Asp precautions  ? ?Fluid and electrolyte imbalance: hypokalemia ?Plan ?Replace and recheck  ? ?Hypoglycemia ?In setting of poor oral intake and sepsis-->still has been borderline   ?Plan ?Repeat am chem  ?Repeat cbg ac hs  ? ?Cervical cord injury ?Quadriplegia ?Repeat CT abdomen and pelvis 4/28 revealed persistent decubitus ulceration with underlying osteomyelitis ?Plan ?Wound care ?Abx as above ?Pressure reducing interventions ?Max nutrition  ? ? ?Difficult IV Access ?Plan ?PICC ? ?History of right BKA and left AKA ?-Patient utilizes wheelchair at baseline ?PLan ?PT/OT ? ?Severe protein calorie malnutrition ?Albumin less than 1.5 on admission ?Plan ?Dysf 3 diet ?Supplemental protein RD following ? ? ?Chronic Anemia ?Plan ?Trend  ?Trigger for transfusion < 7 ? ? ?Best Practice (right click and "Reselect all SmartList Selections" daily)  ? ?Diet/type: Regular consistency (see orders) ?DVT prophylaxis: prophylactic heparin  ?GI prophylaxis: PPI ?Lines: Central line PICC ?Foley:  N/A and Yes, and it is still needed ?Code Status:  full code ?Last date of multidisciplinary goals of care discussion: Pending  ? ? ?My cct 32 min ?Erick Colace ACNP-BC ?[place] ?Pager # [phone] OR # [phone] if no answer ? ? ? ? ?

## 2022-02-22 ENCOUNTER — Inpatient Hospital Stay (HOSPITAL_COMMUNITY): Payer: Medicare Other

## 2022-02-22 ENCOUNTER — Inpatient Hospital Stay (HOSPITAL_COMMUNITY)
Admission: EM | Admit: 2022-02-22 | Discharge: 2022-02-22 | Disposition: A | Discharge status: Home / Self Care | Payer: Medicare Other | Source: Home / Self Care | Attending: Internal Medicine | Admitting: Internal Medicine

## 2022-02-22 DIAGNOSIS — G9340 Encephalopathy, unspecified: Secondary | ICD-10-CM

## 2022-02-22 DIAGNOSIS — R569 Unspecified convulsions: Secondary | ICD-10-CM

## 2022-02-22 DIAGNOSIS — R4182 Altered mental status, unspecified: Secondary | ICD-10-CM | POA: Diagnosis not present

## 2022-02-22 DIAGNOSIS — Z87898 Personal history of other specified conditions: Secondary | ICD-10-CM

## 2022-02-22 DIAGNOSIS — F0280 Dementia in other diseases classified elsewhere without behavioral disturbance: Secondary | ICD-10-CM

## 2022-02-22 DIAGNOSIS — G40919 Epilepsy, unspecified, intractable, without status epilepticus: Secondary | ICD-10-CM

## 2022-02-22 DIAGNOSIS — E878 Other disorders of electrolyte and fluid balance, not elsewhere classified: Secondary | ICD-10-CM

## 2022-02-22 DIAGNOSIS — S069XAS Unspecified intracranial injury with loss of consciousness status unknown, sequela: Secondary | ICD-10-CM

## 2022-02-22 LAB — BASIC METABOLIC PANEL
Anion gap: 4 — ABNORMAL LOW (ref 5–15)
Anion gap: 4 — ABNORMAL LOW (ref 5–15)
BUN: 8 mg/dL (ref 6–20)
BUN: 7 mg/dL (ref 6–20)
CO2: 25 mmol/L (ref 22–32)
CO2: 22 mmol/L (ref 22–32)
Calcium: 7.8 mg/dL — ABNORMAL LOW (ref 8.9–10.3)
Calcium: 8.1 mg/dL — ABNORMAL LOW (ref 8.9–10.3)
Chloride: 106 mmol/L (ref 98–111)
Chloride: 112 mmol/L — ABNORMAL HIGH (ref 98–111)
Creatinine, Ser: 0.74 mg/dL (ref 0.61–1.24)
Creatinine, Ser: 0.6 mg/dL — ABNORMAL LOW (ref 0.61–1.24)
GFR, Estimated: >60 mL/min (ref >60)
GFR, Estimated: >60 mL/min (ref >60)
Glucose, Bld: 408 mg/dL — ABNORMAL HIGH (ref 70–99)
Glucose, Bld: 92 mg/dL (ref 70–99)
Potassium: 3.3 mmol/L — ABNORMAL LOW (ref 3.5–5.1)
Potassium: 3.7 mmol/L (ref 3.5–5.1)
Sodium: 135 mmol/L (ref 135–145)
Sodium: 138 mmol/L (ref 135–145)

## 2022-02-22 LAB — CULTURE, BLOOD (ROUTINE X 2)
Culture: NO GROWTH
Special Requests: ADEQUATE

## 2022-02-22 LAB — CBC
HCT: 22.5 % — ABNORMAL LOW (ref 39.0–52.0)
Hemoglobin: 7.2 g/dL — ABNORMAL LOW (ref 13.0–17.0)
MCH: 30.1 pg (ref 26.0–34.0)
MCHC: 32 g/dL (ref 30.0–36.0)
MCV: 94.1 fL (ref 80.0–100.0)
Platelets: 149 10*3/uL — ABNORMAL LOW (ref 150–400)
RBC: 2.39 MIL/uL — ABNORMAL LOW (ref 4.22–5.81)
RDW: 17.5 % — ABNORMAL HIGH (ref 11.5–15.5)
WBC: 11.1 10*3/uL — ABNORMAL HIGH (ref 4.0–10.5)
nRBC: 0.3 % — ABNORMAL HIGH (ref 0.0–0.2)

## 2022-02-22 LAB — GLUCOSE, CAPILLARY
Glucose-Capillary: 101 mg/dL — ABNORMAL HIGH (ref 70–99)
Glucose-Capillary: 109 mg/dL — ABNORMAL HIGH (ref 70–99)
Glucose-Capillary: 112 mg/dL — ABNORMAL HIGH (ref 70–99)
Glucose-Capillary: 136 mg/dL — ABNORMAL HIGH (ref 70–99)
Glucose-Capillary: 72 mg/dL (ref 70–99)
Glucose-Capillary: 78 mg/dL (ref 70–99)
Glucose-Capillary: 82 mg/dL (ref 70–99)
Glucose-Capillary: 86 mg/dL (ref 70–99)
Glucose-Capillary: 92 mg/dL (ref 70–99)

## 2022-02-22 LAB — PHOSPHORUS
Phosphorus: 3.5 mg/dL (ref 2.5–4.6)
Phosphorus: 3.6 mg/dL (ref 2.5–4.6)

## 2022-02-22 LAB — VALPROIC ACID LEVEL: Valproic Acid Lvl: 51 ug/mL (ref 50.0–100.0)

## 2022-02-22 LAB — MAGNESIUM
Magnesium: 1.7 mg/dL (ref 1.7–2.4)
Magnesium: 1.8 mg/dL (ref 1.7–2.4)
Magnesium: 1.7 mg/dL (ref 1.7–2.4)

## 2022-02-22 LAB — AMMONIA: Ammonia: 33 umol/L (ref 9–35)

## 2022-02-22 MED ORDER — POTASSIUM CHLORIDE 10 MEQ/50ML IV SOLN
INTRAVENOUS | Status: AC
Start: 2022-02-22 — End: 2022-02-22
  Administered 2022-02-22: 10 meq
  Filled 2022-02-22: qty 50

## 2022-02-22 MED ORDER — DEXTROSE-NACL 10-0.45 % IV SOLN
INTRAVENOUS | Status: DC
  Filled 2022-02-22 (×6): qty 1000

## 2022-02-22 MED ORDER — LIDOCAINE HCL URETHRAL/MUCOSAL 2 % EX GEL
1.0000 | Freq: Once | CUTANEOUS | Status: AC
Start: 2022-02-22 — End: 2022-02-22
  Administered 2022-02-22: 1 via TOPICAL
  Filled 2022-02-22: qty 5

## 2022-02-22 MED ORDER — METRONIDAZOLE 500 MG/100ML IV SOLN
500.0000 mg | Freq: Two times a day (BID) | INTRAVENOUS | Status: DC
  Administered 2022-02-22 – 2022-03-02 (×17): 500 mg via INTRAVENOUS
  Filled 2022-02-22 (×18): qty 100

## 2022-02-22 MED ORDER — VITAL HIGH PROTEIN PO LIQD
1000.0000 mL | ORAL | Status: DC
  Administered 2022-02-23: 1000 mL

## 2022-02-22 MED ORDER — LEVETIRACETAM IN NACL 1500 MG/100ML IV SOLN
1500.0000 mg | Freq: Once | INTRAVENOUS | Status: AC
  Administered 2022-02-22: 1500 mg via INTRAVENOUS
  Filled 2022-02-22: qty 100

## 2022-02-22 MED ORDER — POTASSIUM CHLORIDE 10 MEQ/50ML IV SOLN
10.0000 meq | INTRAVENOUS | Status: AC
  Administered 2022-02-22 (×5): 10 meq via INTRAVENOUS
  Filled 2022-02-22 (×6): qty 50

## 2022-02-22 MED ORDER — LEVETIRACETAM IN NACL 500 MG/100ML IV SOLN
500.0000 mg | Freq: Two times a day (BID) | INTRAVENOUS | Status: DC
  Administered 2022-02-23 – 2022-02-25 (×5): 500 mg via INTRAVENOUS
  Filled 2022-02-22 (×6): qty 100

## 2022-02-22 MED ORDER — HYDROCORTISONE SOD SUC (PF) 100 MG IJ SOLR
50.0000 mg | Freq: Four times a day (QID) | INTRAMUSCULAR | Status: DC
  Administered 2022-02-22 – 2022-02-25 (×12): 50 mg via INTRAVENOUS
  Filled 2022-02-22 (×12): qty 2

## 2022-02-22 MED ORDER — PROSOURCE TF PO LIQD
45.0000 mL | Freq: Two times a day (BID) | ORAL | Status: DC
  Administered 2022-02-23 – 2022-03-02 (×14): 45 mL
  Filled 2022-02-22 (×16): qty 45

## 2022-02-22 NOTE — Plan of Care (Signed)
?  Problem: Health Behavior/Discharge Planning: ?Goal: Ability to manage health-related needs will improve ?Outcome: Not Progressing ?  ?Problem: Elimination: ?Goal: Will not experience complications related to bowel motility ?Outcome: Progressing ?Goal: Will not experience complications related to urinary retention ?Outcome: Progressing ?  ?Problem: Safety: ?Goal: Ability to remain free from injury will improve ?Outcome: Progressing ?  ?Problem: Skin Integrity: ?Goal: Risk for impaired skin integrity will decrease ?Outcome: Progressing ?  ?Problem: Education: ?Goal: Knowledge of General Education information will improve ?Description: Including pain rating scale, medication(s)/side effects and non-pharmacologic comfort measures ?Outcome: Not Progressing ?  ?Problem: Health Behavior/Discharge Planning: ?Goal: Ability to manage health-related needs will improve ?Outcome: Progressing ?  ?Problem: Clinical Measurements: ?Goal: Ability to maintain clinical measurements within normal limits will improve ?Outcome: Not Progressing ?Goal: Will remain free from infection ?Outcome: Progressing ?Goal: Diagnostic test results will improve ?Outcome: Progressing ?Goal: Respiratory complications will improve ?Outcome: Progressing ?Goal: Cardiovascular complication will be avoided ?Outcome: Progressing ?  ?Problem: Activity: ?Goal: Risk for activity intolerance will decrease ?Outcome: Progressing ?  ?Problem: Nutrition: ?Goal: Adequate nutrition will be maintained ?Outcome: Not Progressing ?  ?Problem: Coping: ?Goal: Level of anxiety will decrease ?Outcome: Progressing ?  ?Problem: Elimination: ?Goal: Will not experience complications related to bowel motility ?Outcome: Progressing ?Goal: Will not experience complications related to urinary retention ?Outcome: Progressing ?  ?Problem: Pain Managment: ?Goal: General experience of comfort will improve ?Outcome: Progressing ?  ?Problem: Safety: ?Goal: Ability to remain free from injury  will improve ?Outcome: Progressing ?  ?Problem: Skin Integrity: ?Goal: Risk for impaired skin integrity will decrease ?Outcome: Progressing ?  ?

## 2022-02-22 NOTE — Procedures (Signed)
Patient Name: [name]  ?MRN: [id number]  ?Epilepsy Attending: Charlsie Quest  ?Referring Physician/Provider: Simonne Martinet, NP ?Date: 02/22/2022 ?Duration: 22.18 mins ? ?Patient history: 51yo m with h/o seizure now with ams. EEG to evaluate for seizure ? ?Level of alertness: lethargic  ? ?AEDs during EEG study: VPA ? ?Technical aspects: This EEG study was done with scalp electrodes positioned according to the 10-20 International system of electrode placement. Electrical activity was acquired at a sampling rate of 500Hz  and reviewed with a high frequency filter of 70Hz  and a low frequency filter of 1Hz . EEG data were recorded continuously and digitally stored.  ? ?Description: EEG showed continuous generalized 3 to 6 Hz theta-delta slowing. Lateralized periodic discharges with overriding fast activity ( LPD+F) was noted in left posterior quadrant at 1hz  at  Hz. Hyperventilation and photic stimulation were not performed.    ? ?ABNORMALITY ?- Lateralized periodic discharges with overriding fast activity ( LPD +) left posterior quadrant ?- Continuous slow, generalized ? ? ?IMPRESSION: ?This study  showed evidence of epileptogenicity arising from left posterior quadrant with increased risk for seizure recurrence. Additionally there is  ?severe diffuse encephalopathy, nonspecific etiology. No seizures were seen throughout the recording. ? ?If concern for ictal activity persists, please consider Long term EEG.  ? ?Dr and was notified.  ? ?  ? ?
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
## 2022-02-22 NOTE — Progress Notes (Addendum)
? ?NAME:  [name], MRN:  [id number], DOB:  1971-04-28, LOS: 6 ?ADMISSION DATE:  02/12/2022, CONSULTATION DATE:  01/24/2022 ?REFERRING MD:  Dr. Rogene Houston, CHIEF COMPLAINT:  Hypotension with concern for sepsis  ? ?History of Present Illness:  ?[name] is a 51yo male with PMH significant for spinal cord injury, quadriplegia, osteomyelitis of sacrum, neurogenic bladder requiring in and out caths, seizures, and prior wound dehiscence who presented to the [facility] ED via EMS with reports of hypotension. Of note patient was just discharged from this facility 2 days prior for management of sepsis with bacteremia secondary to osteomyelitis.  ? ?On ED arrival patient was seen hypotensive with all other vitals WNL. Lab work significant for NA 131, glucose 42, creatinine 0.53, albumin <1.5, Hgb 8.5, lactic normal at 1.5, INR 1.4. Given hypotension with concern for need of pressor support PCCM consulted for further management and admission   ? ?Pertinent  Medical History  ?spinal cord injury, quadriplegia, osteomyelitis of sacrum, neurogenic bladder requiring in and out caths, seizures, and prior wound dehiscence ? ?Significant Hospital Events: ?Including procedures, antibiotic start and stop dates in addition to other pertinent events   ?4/28 patient presented to ED after home health nurse identified hypotension.  Concern for recurrent sepsis.  Of note patient was just discharged from this facility 2 days prior ?4/29 PICC line placed ?5/1 still on levophed. Passed SLP swallow eval for mech soft diet.  ?5/2 ID recommending 4 weeks CTX and oral flagyl then f/u in ID clinic ?5/3 still on pressors. Goal SBP changed to > 90. Increased midodrine to 10 mg tid.  Refused medications intermittently throughout the day.  Remained on pressors.  Several episodes of hypoglycemia, growing concern for encephalopathy ?5/4 encephalopathic on a.m. rounds, glucose 78, placed on D10 during evening rounds, stress dose steroids  ordered.  ? ?Interim History / Subjective:  ?More encephalopathic  ? ?Objective   ?Blood pressure (Abnormal) 121/56, pulse 82, temperature 98.4 ?F (36.9 ?C), temperature source Oral, resp. rate (Abnormal) 21, height 5\' 3"  (1.6 m), weight 66.9 kg, SpO2 90 %. ?   ?   ? ?Intake/Output Summary (Last 24 hours) at 02/22/2022 0921 ?Last data filed at 02/22/2022 0700 ?Gross per 24 hour  ?Intake 2663.35 ml  ?Output 1175 ml  ?Net 1488.35 ml  ? ?Filed Weights  ? 02/20/22 0500 02/21/22 0442 02/22/22 0500  ?Weight: 67.4 kg 66.7 kg 66.9 kg  ? ? ?Examination:  ?General: This is a 51 year old chronically ill male patient he is lying in bed.  He is more encephalopathic today, speech much more slurred ?Neuro: Awake, interactive, follows commands, speech is slurred, garbled at times, cannot understand answers.  Moves all extremities ?HEENT normocephalic atraumatic mucous membranes are dry sclera nonicteric no neck vein distention ?Pulmonary scattered rhonchi today with rhonchorous cough currently on room air no accessory use ?Cardiac regular rate and rhythm ?Abdomen soft not tender ?Extremities warm dry with bilateral amputations ?GU clear yellow ? ?Resolved Hospital Problem list   ?Lactic Acidosis ? ?Assessment & Plan:  ?Principal Problem: ?  Sepsis due to left ischial osteomyelitis and infected decubitus ulcer ?Active Problems: ?  Cervical spinal cord injury/quadriplegia/neurogenic bladder ?  History of seizure ?  Hx of right BKA and left AKA ?  Wheelchair dependence ?  Pressure ulcer of hip, stage IV ?  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  Severe protein-calorie malnutrition ([place]) ?  AMS (altered mental status) ?  Septic shock ([place]) ?  Alteration in electrolyte and fluid balance ?  Encephalopathy acute ? ? ?Shock Multifactorial: Septic and hypovolemic  ?Sepsis secondary to left pelvic osteomyelitis from chronic pressure wounds to bilateral ischium and trochanters POA ?Previous Staph epi bacteremia  4/23 ?Plan ?Continuing ceftriaxone x4 weeks with start date 5/2 as well as Flagyl 5  ?Continue to titrate norepinephrine for systolic blood pressure greater than 90  ?Continue midodrine 10 mg 3 times daily  ?Avoiding volume depletion  ?Starting stress dose steroids, random cortisol was less than 20, no good correlating evidence of random cortisol and shock state ? ?Acute Metabolic Encephalopathy, he is worse today ?Hx of Seizures ?In setting of hypoglycemia and sepsis ?Family reports he is close to his baseline.  ?Plan ?Continuing IV Depakote  ?Continue neuroprotective agents  ?Seizure precautions  ?Aspiration precautions  ?Repeating blood cultures  ?Check ammonia  ?Obtain EEG ?Ensure euglycemia ? ? ?Fluid and electrolyte imbalance: hypokalemia ?Plan ?Replace and recheck ? ?Hypoglycemia ?In setting of poor oral intake and sepsis-->had to be placed on D10 5/3 ?Plan ?Continuing D10 infusion ?Every 2 hour Accu-Cheks, Solu-Cortef should also help with this ? ?Cervical cord injury ?Quadriplegia ?Repeat CT abdomen and pelvis 4/28 revealed persistent decubitus ulceration with underlying osteomyelitis ?Plan ?Continue wound care  ?Antibiotics as above  ?Maximize nutrition  ? ?Difficult IV Access ?Plan ?PICC ? ?History of right BKA and left AKA ?-Patient utilizes wheelchair at baseline ?PLan ?PT/OT ? ?Severe protein calorie malnutrition ?Albumin less than 1.5 on admission ?Plan ?Dysphagia 3 diet ?Already following ?May need to consider supplemental nutrition via nasogastric tube however I fear with his delirium he will simply pull it out ? ? ?Chronic Anemia ?Plan ?Trend daily CBC ? ? ?Best Practice (right click and "Reselect all SmartList Selections" daily)  ? ?Diet/type: Regular consistency (see orders) ?DVT prophylaxis: prophylactic heparin  ?GI prophylaxis: PPI ?Lines: Central line PICC ?Foley:  N/A and Yes, and it is still needed ?Code Status:  full code ?Last date of multidisciplinary goals of care discussion: Pending   ? ? ?My cct 31 min ?Erick Colace ACNP-BC ?[place] ?Pager # [phone] OR # [phone] if no answer ? ? ? ?Xxxxxxxxxxxxxxxxxxxxxxxxxxxxxxxxxxxxxxxxxxxxxxx ? ?STAFF MD NOTE ? ?S: confused. Got seizures. Going to go to [employer] . Had NG tube placed in RMB and this was removed. STil on low dose pressors ? ? ?O ?Awake confused ? ?A ?Shock ?Encephalopathy with seizures ?NG ube in RMB ? ?Plan ? Support cone transfer ?Seizeure meds ?Needs  NG by IR ?Check cxsr to rule out ptx ? ? ? ?15 min ccm time ? ? ? ?SIGNATURE  ? ? ?Dr. Brand Males, M.D., F.C.C.P,  ?Pulmonary and Critical Care Medicine ?Staff Physician, [place] ?Center Director - Interstitial Lung Disease  Program  ?Medical Director - [facility] ?[facility] [place] [facility] ?[place], [place], [address] ? ?NPI Number:  NPI [id number] ?DEA Number: [id number] ? ?Pager: [phone], If no answer  -> Check AMION or Try [phone] ?Telephone (clinical office): [phone] ?Telephone (research): [phone] ? ?6:58 PM ?02/22/2022 ? ? ?

## 2022-02-22 NOTE — Progress Notes (Signed)
EEG complete - results pending 

## 2022-02-22 NOTE — Progress Notes (Signed)
eLink Physician-Brief Progress Note ?Patient Name: [name] ?DOB: 1971/08/21 ?MRN: [id number] ? ? ?Date of Service ? 02/22/2022  ?HPI/Events of Note ? Pt with hypoglycemia despite D5LR @100ml /hr.   ?eICU Interventions ? Will change fluids to D10 1/2NS to run at 36ml/hr. Will continue to monitor serial glucose   ? ? ? ?  ? ?Lyn Deemer M DELA CRUZ ?02/22/2022, 12:50 AM ?

## 2022-02-22 NOTE — Progress Notes (Addendum)
Interval  ?Principal Problem: ?  Sepsis due to left ischial osteomyelitis and infected decubitus ulcer ?Active Problems: ?  Cervical spinal cord injury/quadriplegia/neurogenic bladder ?  History of seizure ?  Hx of right BKA and left AKA ?  Wheelchair dependence ?  Pressure ulcer of hip, stage IV ?  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  Osteomyelitis ([place]) ?  Severe protein-calorie malnutrition ([place]) ?  AMS (altered mental status) ?  Septic shock ([place]) ?  Alteration in electrolyte and fluid balance ?  Breakthrough seizure ([place]) ?  Encephalopathy acute ? ? ?Brief interval history: ?Initially admitted 4/28 with recurrent sepsis felt secondary to ongoing osteomyelitis placed on broad-spectrum antibiotics, norepinephrine, IV fluid resuscitation.  PICC line placed on the 29th.  Continued to be encephalopathic over the course of his hospitalization seemingly waxing and waning but very noticeably on 5/4.  On exam 5/4 he was awake, speech very slurred and incoherent,Had some mild right sided weakness.  Prolonged discussion with family reviewed that at baseline he is actually quite functional able to make his own medical appointments take care of his own activities of daily living's including tasks such as laundry.  Since his last hospital discharge he has not been able to do this, and since his hospital admission he has remained encephalopathic.  As part of this evaluation I repeated blood cultures, ordered CT of head, and also ordered spot EEG.  Family later noted that he does have history of breakthrough seizure typically identified by prolonged ictal state of confusion.  EEG was completed this afternoon and showed left PLEDs I was contacted by epileptologist who recommended long-term EEG monitoring.  Neurology consultation was obtained, he was loaded with additional Keppra 1500 mg, and valproic acid level was sent.He is currently on valproic acid IV twice daily, depending on valproate  level we may need to titrate this up.  Neurology will be awaiting his arrival to assist with his management once he arrives at [facility].  The rest of his plan of care is as outlined per our last progress note.  Additional concerns have been dysphagia, for which we placed feeding tube however we had some difficulty so may need to be repositioned by IR (awaiting placement imaging). I have updated his brother re: plan of care.  ? ?My cct 34 minutes ?Erick Colace ACNP-BC ?[place] ?Pager # [phone] OR # [phone] if no answer ? ?

## 2022-02-22 NOTE — Progress Notes (Signed)
Afternoon meeting w/ family.  ?Pt still fairly lethargic. He at baseline is able to carry out most activities. Make appointments, do his laundry etc... when he is sick due to his TBI he has more difficulty. Additionally his family notes that w/ seizure he gets more confused as well. He has been very encephalopathic and also now has more pronounced right arm weakness. I was not able to appreciate that yesterday however he would not cooperate w/ exam at that point.  ?Plan ?CT head.  ?Ask for EEg to be done today ?Place small bore feeding tube ?[place] RUE limb swelling ?Cont to monitor ? ? ?NOTE: family is responsible for any medical decision making.  ? ?Simonne Martinet ACNP-BC ?[facility] ?Pager # [phone] OR # [phone] if no answer ? ?

## 2022-02-23 ENCOUNTER — Inpatient Hospital Stay (HOSPITAL_COMMUNITY): Payer: Medicare Other

## 2022-02-23 DIAGNOSIS — R4182 Altered mental status, unspecified: Secondary | ICD-10-CM | POA: Diagnosis not present

## 2022-02-23 DIAGNOSIS — G40919 Epilepsy, unspecified, intractable, without status epilepticus: Secondary | ICD-10-CM | POA: Diagnosis not present

## 2022-02-23 DIAGNOSIS — M7989 Other specified soft tissue disorders: Secondary | ICD-10-CM | POA: Diagnosis not present

## 2022-02-23 LAB — COMPREHENSIVE METABOLIC PANEL
ALT: 8 U/L (ref 0–44)
AST: 16 U/L (ref 15–41)
Albumin: <1.5 g/dL — ABNORMAL LOW (ref 3.5–5.0)
Alkaline Phosphatase: 72 U/L (ref 38–126)
Anion gap: 7 (ref 5–15)
BUN: 7 mg/dL (ref 6–20)
CO2: 22 mmol/L (ref 22–32)
Calcium: 7.8 mg/dL — ABNORMAL LOW (ref 8.9–10.3)
Chloride: 105 mmol/L (ref 98–111)
Creatinine, Ser: 0.71 mg/dL (ref 0.61–1.24)
GFR, Estimated: >60 mL/min (ref >60)
Glucose, Bld: 138 mg/dL — ABNORMAL HIGH (ref 70–99)
Potassium: 4.1 mmol/L (ref 3.5–5.1)
Sodium: 134 mmol/L — ABNORMAL LOW (ref 135–145)
Total Bilirubin: 0.2 mg/dL — ABNORMAL LOW (ref 0.3–1.2)
Total Protein: 5.8 g/dL — ABNORMAL LOW (ref 6.5–8.1)

## 2022-02-23 LAB — PHOSPHORUS
Phosphorus: 3.5 mg/dL (ref 2.5–4.6)
Phosphorus: 3.5 mg/dL (ref 2.5–4.6)

## 2022-02-23 LAB — CBC
HCT: 24.1 % — ABNORMAL LOW (ref 39.0–52.0)
Hemoglobin: 7.9 g/dL — ABNORMAL LOW (ref 13.0–17.0)
MCH: 29.9 pg (ref 26.0–34.0)
MCHC: 32.8 g/dL (ref 30.0–36.0)
MCV: 91.3 fL (ref 80.0–100.0)
Platelets: 153 10*3/uL (ref 150–400)
RBC: 2.64 MIL/uL — ABNORMAL LOW (ref 4.22–5.81)
RDW: 17.7 % — ABNORMAL HIGH (ref 11.5–15.5)
WBC: 8.6 10*3/uL (ref 4.0–10.5)
nRBC: 0.5 % — ABNORMAL HIGH (ref 0.0–0.2)

## 2022-02-23 LAB — GLUCOSE, CAPILLARY
Glucose-Capillary: 105 mg/dL — ABNORMAL HIGH (ref 70–99)
Glucose-Capillary: 116 mg/dL — ABNORMAL HIGH (ref 70–99)
Glucose-Capillary: 118 mg/dL — ABNORMAL HIGH (ref 70–99)
Glucose-Capillary: 123 mg/dL — ABNORMAL HIGH (ref 70–99)
Glucose-Capillary: 129 mg/dL — ABNORMAL HIGH (ref 70–99)
Glucose-Capillary: 132 mg/dL — ABNORMAL HIGH (ref 70–99)
Glucose-Capillary: 155 mg/dL — ABNORMAL HIGH (ref 70–99)

## 2022-02-23 LAB — MAGNESIUM
Magnesium: 1.5 mg/dL — ABNORMAL LOW (ref 1.7–2.4)
Magnesium: 2.3 mg/dL (ref 1.7–2.4)

## 2022-02-23 MED ORDER — MIDODRINE HCL 5 MG PO TABS
10.0000 mg | ORAL_TABLET | Freq: Three times a day (TID) | ORAL | Status: DC
  Administered 2022-02-23 – 2022-02-26 (×9): 10 mg
  Filled 2022-02-23 (×9): qty 2

## 2022-02-23 MED ORDER — JUVEN PO PACK
1.0000 | PACK | Freq: Two times a day (BID) | ORAL | Status: DC
  Administered 2022-02-23 – 2022-03-01 (×12): 1
  Filled 2022-02-23 (×12): qty 1

## 2022-02-23 MED ORDER — VITAL HIGH PROTEIN PO LIQD: 1000.0000 mL | ORAL | Status: DC

## 2022-02-23 MED ORDER — VITAL HIGH PROTEIN PO LIQD
1000.0000 mL | ORAL | Status: AC
  Administered 2022-02-23: 1000 mL

## 2022-02-23 MED ORDER — VITAL 1.5 CAL PO LIQD
1000.0000 mL | ORAL | Status: DC
  Administered 2022-02-23 – 2022-02-28 (×6): 1000 mL
  Filled 2022-02-23 (×2): qty 1000

## 2022-02-23 MED ORDER — MAGNESIUM SULFATE 4 GM/100ML IV SOLN
4.0000 g | Freq: Once | INTRAVENOUS | Status: AC
  Administered 2022-02-23: 4 g via INTRAVENOUS
  Filled 2022-02-23: qty 100

## 2022-02-23 MED ORDER — ADULT MULTIVITAMIN W/MINERALS CH
1.0000 | ORAL_TABLET | Freq: Every day | ORAL | Status: DC
  Administered 2022-02-24 – 2022-03-02 (×7): 1
  Filled 2022-02-23 (×8): qty 1

## 2022-02-23 MED ORDER — VALPROATE SODIUM 100 MG/ML IV SOLN
800.0000 mg | Freq: Four times a day (QID) | INTRAVENOUS | Status: DC
  Administered 2022-02-23 – 2022-03-03 (×30): 800 mg via INTRAVENOUS
  Filled 2022-02-23 (×40): qty 8
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
## 2022-02-23 NOTE — Progress Notes (Signed)
? ?NAME:  [name], MRN:  [id number], DOB:  July 21, 1971, LOS: 7 ?ADMISSION DATE:  01/31/2022, CONSULTATION DATE:  02/12/2022 ?REFERRING MD:  Dr. Rogene Houston, CHIEF COMPLAINT:  Hypotension with concern for sepsis  ? ?History of Present Illness:  ?[name] is a 51yo male with PMH significant for spinal cord injury, quadriplegia, osteomyelitis of sacrum, neurogenic bladder requiring in and out caths, seizures, and prior wound dehiscence who presented to the [facility] ED via EMS with reports of hypotension. Of note patient was just discharged from this facility 2 days prior for management of sepsis with bacteremia secondary to osteomyelitis.  ? ?On ED arrival patient was seen hypotensive with all other vitals WNL. Lab work significant for NA 131, glucose 42, creatinine 0.53, albumin <1.5, Hgb 8.5, lactic normal at 1.5, INR 1.4. Given hypotension with concern for need of pressor support PCCM consulted for further management and admission   ? ?Pertinent  Medical History  ?spinal cord injury, quadriplegia, osteomyelitis of sacrum, neurogenic bladder requiring in and out caths, seizures, and prior wound dehiscence ? ?Significant Hospital Events: ?Including procedures, antibiotic start and stop dates in addition to other pertinent events   ?4/28 patient presented to ED after home health nurse identified hypotension.  Concern for recurrent sepsis.  Of note patient was just discharged from this facility 2 days prior ?4/29 PICC line placed ?5/1 still on levophed. Passed SLP swallow eval for mech soft diet.  ?5/2 ID recommending 4 weeks CTX and oral flagyl then f/u in ID clinic ?5/3 still on pressors. Goal SBP changed to > 90. Increased midodrine to 10 mg tid.  Refused medications intermittently throughout the day.  Remained on pressors.  Several episodes of hypoglycemia, growing concern for encephalopathy ?5/4 encephalopathic on a.m. rounds, glucose 78, placed on D10 during evening rounds, stress dose steroids  ordered.  ?5/5 Remains encephalopathic with intermittent agitation   ? ?Interim History / Subjective:  ?Receiving bed bath at time of assessment  ?Remains encephalopathic  ? ?Objective   ?Blood pressure 122/62, pulse 63, temperature (!) 96 ?F (35.6 ?C), temperature source Axillary, resp. rate 20, height 5\' 3"  (1.6 m), weight 69.1 kg, SpO2 93 %. ?   ?   ? ?Intake/Output Summary (Last 24 hours) at 02/23/2022 0751 ?Last data filed at 02/23/2022 0601 ?Gross per 24 hour  ?Intake 2435.38 ml  ?Output 1095 ml  ?Net 1340.38 ml  ? ?Filed Weights  ? 02/21/22 0442 02/22/22 0500 02/23/22 0127  ?Weight: 66.7 kg 66.9 kg 69.1 kg  ? ? ?Examination:  ?General: Acute on chronically ill appearing middle aged male lying in bed, in NAD ?HEENT: [place]/AT, MM pink/moist, PERRL,  ?Neuro: Eyes spontaneously open unable to follow commands  ?CV: s1s2 regular rate and rhythm, no murmur, rubs, or gallops,  ?PULM:  Clear to ascultation, no increased work of, diminished bases,  ?GI: soft, bowel sounds active in all 4 quadrants, non-tender, non-distended, tolerating TF ?Extremities: warm/dry, bilaterally AKA  ?Skin: Large pressure wound  ? ? ? ?Resolved Hospital Problem list   ?Lactic Acidosis ? ?Assessment & Plan:  ?Principal Problem: ?  Sepsis due to left ischial osteomyelitis and infected decubitus ulcer ?Active Problems: ?  Cervical spinal cord injury/quadriplegia/neurogenic bladder ?  History of seizure ?  Hx of right BKA and left AKA ?  Wheelchair dependence ?  Pressure ulcer of hip, stage IV ?  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  Osteomyelitis ([place]) ?  Severe protein-calorie malnutrition ([place]) ?  AMS (altered mental status) ?  Septic shock ([place]) ?  Alteration in electrolyte and fluid balance ?  Breakthrough seizure ([place]) ?  Encephalopathy acute ? ? ?Shock Multifactorial: Septic and hypovolemic  ?Sepsis secondary to left pelvic osteomyelitis from chronic pressure wounds to bilateral ischium and trochanters  POA ?-Previous Staph epi bacteremia 4/23 ?-Pressors stopped overnight/early am  ?P: ?ID following, appreciate assistance  ?Continue Ceftriaxone and Flagyl  x4 weeks (start date 5/2)  ?MAP goal > 65 ?Continue Midodrine  ?Stress dose sterids started 5/4 can consider deescalation soon now that patient is off pressors  ? ?Acute Metabolic Encephalopathy, he is worse today ?Hx of Seizures ?-In setting of hypoglycemia and sepsis ?-Family reports he is close to his baseline.  ?P: ?Primary management per neurology  ?Maintain neuro protective measures; goal for eurothermia, euglycemia, eunatermia, normoxia, and PCO2 goal of 35-40 ?Nutrition and bowel regiment  ?Seizure precautions  ?Continue Keppra and Vimpat ?Aspirations precautions  ?Continuous EEG per neurology ?Frequent neurochecks ? ?Fluid and electrolyte imbalance: hypokalemia ?P: ?Trend B,et  ?Supplement as needed  ? ?Hypoglycemia ?-In setting of poor oral intake and sepsis-->had to be placed on D10 5/3 ?P: ?Cortrack placed 5/4 continue TF  ?Trend CBG ? ?Cervical cord injury ?Quadriplegia ?-Repeat CT abdomen and pelvis 4/28 revealed persistent decubitus ulceration with underlying osteomyelitis ?P: ?Continue local wound care  ?Pressure alleviating devices  ?Antibiotics as above  ?Optimize protein supplementation  ? ?History of right BKA and left AKA ?-Patient utilizes wheelchair at baseline ?P: ?PT/OT when able ? ?Severe protein calorie malnutrition ?-Albumin less than 1.5 on admission ?P: ?Cortrack now in place  ?Supplement protein dietitian following ? ?Chronic Anemia ?P: ?Trend CBC  ?Transfuse per protocol  ?Hgb goal > 7 ? ?Best Practice (right click and "Reselect all SmartList Selections" daily)  ? ?Diet/type: Regular consistency (see orders) ?DVT prophylaxis: prophylactic heparin  ?GI prophylaxis: PPI ?Lines: Central line PICC ?Foley:  N/A and Yes, and it is still needed ?Code Status:  full code ?Last date of multidisciplinary goals of care discussion: Pending   ? ?CRITICAL CARE ?Performed by: Rikayla Demmon D. Harris ? ?Total critical care time: 37 minutes ? ?Critical care time was exclusive of separately billable procedures and treating other patients. ? ?Critical care was necessary to treat or prevent imminent or life-threatening deterioration. ? ?Critical care was time spent personally by me on the following activities: development of treatment plan with patient and/or surrogate as well as nursing, discussions with consultants, evaluation of patient's response to treatment, examination of patient, obtaining history from patient or surrogate, ordering and performing treatments and interventions, ordering and review of laboratory studies, ordering and review of radiographic studies, pulse oximetry and re-evaluation of patient's condition. ? ?Reem Fleury D. Harris, NP-C ?[facility] ?Personal contact information can be found on Amion  ?02/23/2022, 11:03 AM ? ? ?

## 2022-02-23 NOTE — Progress Notes (Signed)
eLink Physician-Brief Progress Note ?Patient Name: [name] ?DOB: 03/28/1971 ?MRN: [id number] ? ? ?Date of Service ? 02/23/2022  ?HPI/Events of Note ? Agitation - Patient pulling at EEG wires. Nursing request for bilateral wrist restraints.  ?eICU Interventions ? Will order bilaterals soft wrist restraints X 4 hours.   ? ? ? ?Intervention Category ?Major Interventions: Delirium, psychosis, severe agitation - evaluation and management ? ?Kerissa Coia Dennard Nip ?02/23/2022, 5:09 AM ?

## 2022-02-23 NOTE — Progress Notes (Signed)
LTM EEG hooked up and running - no initial skin breakdown - push button tested - neuro notified. [facility] monitoring.  

## 2022-02-23 NOTE — Progress Notes (Signed)
Nutrition Follow-up ? ?DOCUMENTATION CODES:  ? ?Non-severe (moderate) malnutrition in context of chronic illness ? ?INTERVENTION:  ? ?Initiate tube feeding via Cortrak: ?Vital 1.5 at 35 ml/h and increase by 10 ml every 8 hours to goal rate of 55 ml/hr (1320 ml per day) ?Prosource TF 45 ml BID ? ?Provides 2060 kcal, 111 gm protein, 1003 ml free water daily ? ? ? ?NUTRITION DIAGNOSIS:  ? ?Moderate Malnutrition related to chronic illness as evidenced by moderate fat depletion, moderate muscle depletion, severe muscle depletion. ?Ongoing.  ? ?GOAL:  ? ?Patient will meet greater than or equal to 90% of their needs ?Met with TF at goal  ? ?MONITOR:  ? ?PO intake, Supplement acceptance, Diet advancement, Labs, Weight trends, Skin ? ?REASON FOR ASSESSMENT:  ? ?Consult ?Enteral/tube feeding initiation and management ? ?ASSESSMENT:  ? ?51 yo male admitted with hypotension, recurrent sepsis d/t left ischial osteomyelitis and infected decubitus ulcer. PMH includes staph epidermidis bacteremia, SCI, quadriplegia, sacral osteomyelitis, neurogenic bladder, seizures, tobacco use, L AKA, R BKA. ? ?Pt discussed during ICU rounds and with RN. Pt encephalopathic unable to answer any questions.  ? ?04/28 - admitted for sepsis ?05/05 - tx to [facility] for cEEG ; cortrak placed tip gastric antrum per xray  ? ? ?Medications reviewed and include: solu-cortef, MVI with minerals, juven, protonix ? ?Labs reviewed: Na 134 ?CBG's: 123-155 ? ? ? ?Diet Order:   ?Diet Order   ? ?       ?  Diet NPO time specified  Diet effective now       ?  ? ?  ?  ? ?  ? ? ?EDUCATION NEEDS:  ? ?Not appropriate for education at this time ? ?Skin:  Skin Integrity Issues:: Stage II, DTI, Stage IV ?DTI: R hip ?Stage II: R buttocks ?Stage IV: L ischial tuberosity, L hip x 2 ? ?Last BM:  5/5 x 2 type 7 ? ?Height:  ? ?Ht Readings from Last 1 Encounters:  ?02/23/22 5' 3"  (1.6 m)  ? ? ?Weight:  ? ?Wt Readings from Last 1 Encounters:  ?02/23/22 69.1 kg  ? ? ?BMI:  Body mass index  is 26.99 kg/m?. ? ?Estimated Nutritional Needs:  ? ?Kcal:  2000-2200 ? ?Protein:  100-120 gm ? ?Fluid:  2.2-2.4 L ? ?Lockie Pares., RD, LDN, CNSC ?See AMiON for contact information  ? ?

## 2022-02-23 NOTE — Procedures (Addendum)
Patient Name: [name]  ?MRN: [id number]  ?Epilepsy Attending: Charlsie Quest  ?Referring Physician/Provider: Simonne Martinet, NP ?Duration: 02/23/2022 0346 to 02/24/2022 0346 ?  ?Patient history: 52yo m with h/o seizure now with ams. EEG to evaluate for seizure ?  ?Level of alertness: lethargic  ?  ?AEDs during EEG study: VPA, LEV ?  ?Technical aspects: This EEG study was done with scalp electrodes positioned according to the 10-20 International system of electrode placement. Electrical activity was acquired at a sampling rate of 500Hz  and reviewed with a high frequency filter of 70Hz  and a low frequency filter of 1Hz . EEG data were recorded continuously and digitally stored.  ?  ?Description: EEG showed continuous generalized 3 to 6 Hz theta-delta slowing. Lateralized periodic discharges with overriding fast activity ( LPD+F) was noted in left posterior quadrant at 1hz . Hyperventilation and photic stimulation were not performed.    ? ?Patient event button was pressed on 02/24/2022 at 1616 during which patient was being cleaned and was noted to have bilateral upper extremity and head twitching. Concomitant eeg showed  lateralized periodic discharges with overriding fast activity ( LPD+F) in left posterior quadrant at 1hz  ?  ?ABNORMALITY  ?- Lateralized periodic discharges with overriding fast activity ( LPD +) left posterior quadrant ?- Continuous slow, generalized ?  ?IMPRESSION: ?This study  showed evidence of epileptogenicity arising from left posterior quadrant with increased risk for seizure recurrence. Additionally there is  ?moderate to severe diffuse encephalopathy, nonspecific etiology. No seizures were seen throughout the recording. ? ?Patient event button was pressed on 02/24/2022 at 1616 during which patient was being cleaned and was noted to have bilateral upper extremity and head twitching without concomitant eeg change. This was most likely not an epileptic event.  ?  ?  ?  ?

## 2022-02-23 NOTE — Progress Notes (Signed)
SLP Cancellation Note ? ?Patient Details ?Name: [name] ?MRN: [id number] ?DOB: [birth year] ? ? ?Cancelled treatment:       Reason Eval/Treat Not Completed: Other (comment) (Pt transferred to [facility] due for neuro monitoring given concerns for seizures; Will follow up next week, if need assist over the weekend, please contact w/e pager [phone] or call [phone]. Thank you.) ? ?Kathleen Lime, MS CCC SLP ?Acute Rehab Services ?Office [phone] ?Pager [phone] ? ?[name] ?02/23/2022, 7:39 AM ?

## 2022-02-23 NOTE — Progress Notes (Signed)
EEG maintenance performed.  No skin breakdown observed at electrode sites Fp1, Fp2. 

## 2022-02-23 NOTE — Progress Notes (Signed)
Upper extremity venous RT study completed. ? ?Preliminary results relayed to Marchelle Gearing, MD via secure chat. ? ?See CV Proc for preliminary results report.  ? ?Jean Rosenthal, RDMS, RVT ? ?

## 2022-02-23 NOTE — Progress Notes (Deleted)
Attending Note:  ?I have examined patient, reviewed labs, studies and notes.  ? ?51 year old man with history of quadriplegia due to C-spine injury.  He has neurogenic bladder, chronic sacral wounds.  Admitted with shock, encephalopathy.  Developed encephalopathy, question seizure activity.  Continuous EEG with evidence for possible postictal state without any active seizures. ? ?Vitals:  ? 02/23/22 1400 02/23/22 1430 02/23/22 1500 02/23/22 1600  ?BP: (!) 107/51 (!) 96/52 (!) 104/50 (!) 100/47  ?Pulse: 67 61 68 79  ?Resp: 18 16 20 16   ?Temp:      ?TempSrc:      ?SpO2: 100% 100% 100% 92%  ?Weight:      ?Height:      ?Chronically ill-appearing man laying in bed.  Continuous EEG in place.  Opens eyes spontaneously.  Does not communicate or follow commands.  Heart regular without a murmur.  Abdomen is nondistended with positive bowel sounds.  Tolerating tube feeding.  Lungs clear bilaterally.  He has bilateral AKA.  Skin with large pressure wounds as have been documented ? ?Multifactorial septic shock due to left pelvic osteomyelitis from chronic pressure wound.  Planning for 4 weeks of ceftriaxone and metronidazole.  Continue midodrine.  Pressors weaned to off.  Consider discontinuation stress dose steroids on 5/6. ? ?Acute metabolic encephalopathy, question active seizures.  No recurrent seizure activity on continuous EEG.  Remains on Keppra and Vimpat.  Appreciate neurology management. ? ?Hypoglycemia and electrolyte imbalance, hypokalemia. ? ?Severe protein calorie malnutrition.  Core track in place and will initiate tube feeding today 5/5 ? ?Independent critical care time is 31 minutes.  ? ?Baltazar Apo, MD, PhD ?02/23/2022, 4:12 PM ?[facility] ?[phone] or if no answer [phone] ? ?

## 2022-02-23 NOTE — Progress Notes (Signed)
[facility] ADULT ICU REPLACEMENT PROTOCOL ? ? ?The patient does apply for the [facility] Adult ICU Electrolyte Replacment Protocol based on the criteria listed below:  ? ?1.Exclusion criteria: TCTS patients, ECMO patients, and Dialysis patients ?2. Is GFR >/= 30 ml/min? Yes.    ?Patient's GFR today is >60 ?3. Is SCr </= 2? Yes.   ?Patient's SCr is 0.71 mg/dL ?4. Did SCr increase >/= 0.5 in 24 hours? No. ?5.Pt's weight >40kg  Yes.   ?6. Abnormal electrolyte(s): mag 1.5  ?7. Electrolytes replaced per protocol ?8.  Call MD STAT for K+ </= 2.5, Phos </= 1, or Mag </= 1 ?Physician:  n/a ? ?Melvern Banker 02/23/2022 6:41 AM ? ?

## 2022-02-23 NOTE — Procedures (Signed)
Cortrak  Person Inserting Tube:  [name], RD Tube Type:  Cortrak - 43 inches Tube Size:  10 Tube Location:  Left nare Secured by: Bridle Technique Used to Measure Tube Placement:  Marking at nare/corner of mouth Cortrak Secured At:  67 cm  Cortrak Tube Team Note:  Consult received to place a Cortrak feeding tube.   X-ray is required, abdominal x-ray has been ordered by the Cortrak team. Please confirm tube placement before using the Cortrak tube.   If the tube becomes dislodged please keep the tube and contact the Cortrak team at [email or web address] ([email or web address]) for replacement.  If after hours and replacement cannot be delayed, place a NG tube and confirm placement with an abdominal x-ray.    [name], RD, LDN Clinical Dietitian RD pager # available in AMION  After hours/weekend pager # available in AMION   

## 2022-02-24 ENCOUNTER — Inpatient Hospital Stay (HOSPITAL_COMMUNITY): Payer: Medicare Other

## 2022-02-24 DIAGNOSIS — G40919 Epilepsy, unspecified, intractable, without status epilepticus: Secondary | ICD-10-CM | POA: Diagnosis not present

## 2022-02-24 DIAGNOSIS — J9601 Acute respiratory failure with hypoxia: Secondary | ICD-10-CM

## 2022-02-24 DIAGNOSIS — R4182 Altered mental status, unspecified: Secondary | ICD-10-CM | POA: Diagnosis not present

## 2022-02-24 LAB — GLUCOSE, CAPILLARY
Glucose-Capillary: 107 mg/dL — ABNORMAL HIGH (ref 70–99)
Glucose-Capillary: 128 mg/dL — ABNORMAL HIGH (ref 70–99)
Glucose-Capillary: 117 mg/dL — ABNORMAL HIGH (ref 70–99)
Glucose-Capillary: 122 mg/dL — ABNORMAL HIGH (ref 70–99)
Glucose-Capillary: 137 mg/dL — ABNORMAL HIGH (ref 70–99)
Glucose-Capillary: 129 mg/dL — ABNORMAL HIGH (ref 70–99)
Glucose-Capillary: 134 mg/dL — ABNORMAL HIGH (ref 70–99)
Glucose-Capillary: 115 mg/dL — ABNORMAL HIGH (ref 70–99)

## 2022-02-24 LAB — CBC
HCT: 23.2 % — ABNORMAL LOW (ref 39.0–52.0)
Hemoglobin: 7.3 g/dL — ABNORMAL LOW (ref 13.0–17.0)
MCH: 29.7 pg (ref 26.0–34.0)
MCHC: 31.5 g/dL (ref 30.0–36.0)
MCV: 94.3 fL (ref 80.0–100.0)
Platelets: 134 10*3/uL — ABNORMAL LOW (ref 150–400)
RBC: 2.46 MIL/uL — ABNORMAL LOW (ref 4.22–5.81)
RDW: 18 % — ABNORMAL HIGH (ref 11.5–15.5)
WBC: 7 10*3/uL (ref 4.0–10.5)
nRBC: 1 % — ABNORMAL HIGH (ref 0.0–0.2)

## 2022-02-24 LAB — BASIC METABOLIC PANEL
Anion gap: 5 (ref 5–15)
BUN: 20 mg/dL (ref 6–20)
CO2: 23 mmol/L (ref 22–32)
Calcium: 7.4 mg/dL — ABNORMAL LOW (ref 8.9–10.3)
Chloride: 107 mmol/L (ref 98–111)
Creatinine, Ser: 0.76 mg/dL (ref 0.61–1.24)
GFR, Estimated: >60 mL/min (ref >60)
Glucose, Bld: 123 mg/dL — ABNORMAL HIGH (ref 70–99)
Potassium: 3.6 mmol/L (ref 3.5–5.1)
Sodium: 135 mmol/L (ref 135–145)

## 2022-02-24 MED ORDER — POTASSIUM CHLORIDE 20 MEQ PO PACK
40.0000 meq | PACK | Freq: Once | ORAL | Status: AC
  Administered 2022-02-24: 40 meq
  Filled 2022-02-24: qty 2

## 2022-02-24 MED ORDER — PANTOPRAZOLE 2 MG/ML SUSPENSION
40.0000 mg | Freq: Every day | ORAL | Status: DC
  Administered 2022-02-25 – 2022-03-02 (×6): 40 mg
  Filled 2022-02-24 (×7): qty 20

## 2022-02-24 MED ORDER — LACTATED RINGERS IV BOLUS
500.0000 mL | Freq: Once | INTRAVENOUS | Status: AC
  Administered 2022-02-24: 500 mL via INTRAVENOUS

## 2022-02-24 MED ORDER — SODIUM CHLORIDE 0.9 % IV SOLN
200.0000 mg | Freq: Two times a day (BID) | INTRAVENOUS | Status: DC
  Administered 2022-02-24 – 2022-03-02 (×13): 200 mg via INTRAVENOUS
  Filled 2022-02-24 (×16): qty 20

## 2022-02-24 MED ORDER — ALBUMIN HUMAN 25 % IV SOLN
INTRAVENOUS | Status: AC
  Filled 2022-02-24: qty 50

## 2022-02-24 MED ORDER — ALBUMIN HUMAN 25 % IV SOLN
25.0000 g | Freq: Once | INTRAVENOUS | Status: AC
  Administered 2022-02-24: 25 g via INTRAVENOUS

## 2022-02-24 MED ORDER — DAKINS (1/4 STRENGTH) 0.125 % EX SOLN
Freq: Two times a day (BID) | CUTANEOUS | Status: DC
  Administered 2022-02-27 – 2022-02-28 (×4): 1
  Filled 2022-02-24 (×5): qty 473

## 2022-02-24 MED ORDER — ALBUMIN HUMAN 25 % IV SOLN
INTRAVENOUS | Status: AC
  Filled 2022-02-24: qty 100

## 2022-02-24 NOTE — Progress Notes (Signed)
Subjective: ?Continues to have right-sided weakness which family states is new ? ?Exam: ?Vitals:  ? 02/24/22 1700 02/24/22 1800  ?BP: (!) 111/59 114/65  ?Pulse:  73  ?Resp: 20 20  ?Temp:    ?SpO2: 98% 93%  ? ?Gen: In bed, NAD ?Resp: non-labored breathing, no acute distress ?Abd: soft, nt ? ?Neuro: ?MS: Opens eyes, but does not follow commands, he tracks me around the room ?CN: Crosses midline in both directions, unclear blink to threat as he does not from either side ?Motor: He has severe right arm >right leg weakness ?Sensory: Response to noxious stim x4 ? ?Pertinent Labs: ?Albumin less than 1.5, calcium 7.4 ?Hemoglobin 7.3 ?Creatinine 0.76 ? ?Impression: 51 year old male with history of TBI, focal seizures on Depakote at home who was admitted with sepsis due to osteomyelitis.  He was admitted on the 28th with hypotension and altered mental status concerning for sepsis.  On 5/4 an EEG was performed which demonstrated PLEDs plus, he was loaded with Keppra and transferred to [facility] for continuous EEG. ? ?He continues to have epileptogenic periodic discharges on the right, with persistent right negative deficits.  In this setting, I would favor being more aggressive and I will start him on lacosamide.  I am hesitant to use benzodiazepines or other sedating medications given the tenuous respiratory status. ? ?Recommendations: ?1) continue Depakote 800 3 times daily, repeat level tomorrow ?2) increase Keppra to 1 g twice daily ?3) Vimpat 200 twice daily ?4) neurology will continue to follow ? ?Ritta Slot, MD ?[facility] ?[phone] ? ?If 7pm- 7am, please page neurology on call as listed in AMION. ? ?

## 2022-02-24 NOTE — Progress Notes (Signed)
LTM maint complete - no skin breakdown under: Fp1 Fp2 A2 ?[facility] monitored, Event button test confirmed by [facility]. ? ?

## 2022-02-24 NOTE — Procedures (Addendum)
Patient Name: [name]  ?MRN: [id number]  ?Epilepsy Attending: Charlsie Quest  ?Referring Physician/Provider: Simonne Martinet, NP ?Duration: 02/24/2022 0346 to 5/72023 0346 ?  ?Patient history: 51yo m with h/o seizure now with ams. EEG to evaluate for seizure ?  ?Level of alertness: lethargic  ?  ?AEDs during EEG study: VPA, LEV ?  ?Technical aspects: This EEG study was done with scalp electrodes positioned according to the 10-20 International system of electrode placement. Electrical activity was acquired at a sampling rate of 500Hz  and reviewed with a high frequency filter of 70Hz  and a low frequency filter of 1Hz . EEG data were recorded continuously and digitally stored.  ?  ?Description: EEG showed continuous generalized 3 to 6 Hz theta-delta slowing. Lateralized periodic discharges with overriding fast activity ( LPD+F) was noted in left posterior quadrant at 1hz . Hyperventilation and photic stimulation were not performed.    ?  ?ABNORMALITY  ?- Lateralized periodic discharges with overriding fast activity ( LPD +) left posterior quadrant ?- Continuous slow, generalized ?  ?IMPRESSION: ?This study  showed evidence of epileptogenicity arising from left posterior quadrant with increased risk for seizure recurrence. Additionally there is  ?moderate to severe diffuse encephalopathy, nonspecific etiology. No seizures were seen throughout the recording. ?  ?  ?  ?

## 2022-02-24 NOTE — Progress Notes (Signed)
LTM maint complete - no skin breakdown under: Fp1 Fp2 F4 [facility] monitored, Event button test confirmed by [facility].  

## 2022-02-24 NOTE — Progress Notes (Signed)
? ?NAME:  [name], MRN:  [id number], DOB:  March 31, 1971, LOS: 8 ?ADMISSION DATE:  03/09/2022, CONSULTATION DATE:  03/09/2022 ?REFERRING MD:  Dr. Deretha Emory, CHIEF COMPLAINT:  Hypotension with concern for sepsis  ? ?History of Present Illness:  ?[name] is a 51yo male with PMH significant for spinal cord injury, quadriplegia, osteomyelitis of sacrum, neurogenic bladder requiring in and out caths, seizures, and prior wound dehiscence who presented to the [facility] ED via EMS with reports of hypotension. Of note patient was just discharged from this facility 2 days prior for management of sepsis with bacteremia secondary to osteomyelitis.  ? ?On ED arrival patient was seen hypotensive with all other vitals WNL. Lab work significant for NA 131, glucose 42, creatinine 0.53, albumin <1.5, Hgb 8.5, lactic normal at 1.5, INR 1.4. Given hypotension with concern for need of pressor support PCCM consulted for further management and admission   ? ?Pertinent  Medical History  ?spinal cord injury, quadriplegia, osteomyelitis of sacrum, neurogenic bladder requiring in and out caths, seizures, and prior wound dehiscence ? ?Significant Hospital Events: ?Including procedures, antibiotic start and stop dates in addition to other pertinent events   ?4/28 patient presented to ED after home health nurse identified hypotension.  Concern for recurrent sepsis.  Of note patient was just discharged from this facility 2 days prior ?4/29 PICC line placed ?5/1 still on levophed. Passed SLP swallow eval for mech soft diet.  ?5/2 ID recommending 4 weeks CTX and oral flagyl then f/u in ID clinic ?5/3 still on pressors. Goal SBP changed to > 90. Increased midodrine to 10 mg tid.  Refused medications intermittently throughout the day.  Remained on pressors.  Several episodes of hypoglycemia, growing concern for encephalopathy ?5/4 encephalopathic on a.m. rounds, glucose 78, placed on D10 during evening rounds, stress dose steroids  ordered.  ?5/5 Remains encephalopathic with intermittent agitation   ?5/6 Renal US given oliguria  ? ?Interim History / Subjective:  ? Minimal UOP overnight despite fluid bolus ?Is net positive 9L this admission  ? ?Objective   ?Blood pressure (!) 117/59, pulse 100, temperature (!) 96.4 ?F (35.8 ?C), temperature source Axillary, resp. rate (!) 26, height 5\' 3"  (1.6 m), weight 74.9 kg, SpO2 95 %. ?   ?   ? ?Intake/Output Summary (Last 24 hours) at 02/24/2022 1127 ?Last data filed at 02/24/2022 1000 ?Gross per 24 hour  ?Intake 1645.95 ml  ?Output 395 ml  ?Net 1250.95 ml  ? ?Filed Weights  ? 02/22/22 0500 02/23/22 0127 02/24/22 0425  ?Weight: 66.9 kg 69.1 kg 74.9 kg  ? ? ?Examination:  ?General: Acutely and chronically ill cachectic middle aged M in bed NAD  ?HEENT: NCAT pink mm anicteric sclera  ?Neuro:  Lethargic. Oriented to self. Following some simple commands  ?CV: rrr s1s2 cap refill < 3sec  ?PULM:  CTAb even unlabored  ?GI: soft ndnt thin  ?Extremities: BLE amputations  ?Skin: Sacral decub ulcers  ? ? ? ?Resolved Hospital Problem list   ?Lactic Acidosis ? ?Assessment & Plan:  ? ?Sepsis due to sacral osteomyelitis ?Sacral decubitus ulcers ?-Previous Staph epi bacteremia 4/23 ?P: ?-4wk course rocephin, flagyl  ? ?Hypotension, improving  -- chronic component + hypovolemia + improved shock ?P ?-cont midodrine ?-cont solucortef for now  ? 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 ?AKI with oliguria ?Hx neurogenic bladder ?P: ?-renal US ?-500 ml LR bolus  ?-cont foley for now with change in renal fxn, but has been in for 7d -- could change to [facility] I/O  ? ?Hx cervical cord injury with resultant paraplegia  ?Hx R BKA, L AKA ?P: ?-supportive care ?-PO, OT  ? ?Severe protein  calorie malnutrition  ?-Albumin less than 1.5 on admission ?P: ?-EN via cortrak  ? ?Chronic anemia + anemia of critical disease  ?P: ?-PRN CBC  -- think we can likely space to QOD and reduce iatrogenic blood loss  ? ?Best Practice (right click and "Reselect all SmartList Selections" daily)  ? ?Diet/type: tubefeeds ?DVT prophylaxis: prophylactic heparin  ?GI prophylaxis: PPI ?Lines: Central line PICC ?Foley:  N/A and Yes, and it is still needed ?Code Status:  full code ?Last date of multidisciplinary goals of care discussion: family updated 5/5  ? ?CRITICAL CARE ?Performed by: Lanier Clam ? ?Total critical care time: 40 minutes ? ?Critical care time was exclusive of separately billable procedures and treating other patients. ?Critical care was necessary to treat or prevent imminent or life-threatening deterioration. ? ?Critical care was time spent personally by me on the following activities: development of treatment plan with patient and/or surrogate as well as nursing, discussions with consultants, evaluation of patient's response to treatment, examination of patient, obtaining history from patient or surrogate, ordering and performing treatments and interventions, ordering and review of laboratory studies, ordering and review of radiographic studies, pulse oximetry and re-evaluation of patient's condition. ? ?Tessie Fass MSN, AGACNP-BC ? Pulmonary/Critical Care Medicine ?Amion for pager  ?02/24/2022, 11:27 AM ? ? ? ?

## 2022-02-24 NOTE — Consult Note (Signed)
[facility] Nurse Consult Note: ?Reason for Consult:Wound care Orders required ?Wound type:Chronic, nonhealing pressure injuries, Stage 4 ?Pressure Injury POA: Yes ? ?Patient seen by my associate, Lenore Manner on 5/1 and 02/20/22.  Her orders for sodium hypochlorite solution dampened dressing to the left IT have expired and nursing is seeking guidance for continuation of wound care.  I will reorder these interventions. ? ?Dr. Sharol Given (Orthopedics) saw the patient on 4/25. Surgery (Dr. Johney Maine) saw the patient on 02/05/22. ?My associate M. Liane Comber saw the patient on 02/05/22. Please see notes from those encounters. ? ?A mattress replacement with low air loss feature is ordered and guidance for turning from side to side and for keeping the [facility] at or below a 30 degree angle are provided. ? ?[place] nursing team will not follow, but will remain available to this patient, the nursing and medical teams.  Please re-consult if needed. ?Thanks, ?Maudie Flakes, MSN, RN, [place], [place], CWON-AP, [place]  ?Pager# [phone]  ? ? ? ?  ?

## 2022-02-24 NOTE — Progress Notes (Signed)
Pt had low output throughout the night , CCM notified ?

## 2022-02-24 NOTE — Progress Notes (Signed)
PCCM Interval Note ? ?Acute desaturation this afternoon, then required NT suctioning for significant secretions.  Despite this he required up titration to 1.00 mask.  SPO2 beginning to improve, but has not been able to wean O2 yet. ? ?Chest x-ray performed, reviewed, shows significant increase in volume loss and right lower lobe infiltrate, suspect some mucous plugging. ? ?Plan: ?-Initiate chest PT via bed vibration 3 times daily ?-Continue same antibiotics, ceftriaxone, vancomycin ?-Follow chest x-ray ?-Wean FiO2 today as able. ? ? ?Independent CC time 20 minutes ? ? ?Levy Pupa, MD, PhD ?02/24/2022, 3:02 PM ?[facility] ?[phone] or if no answer before 7:00PM call [phone] ?For any issues after 7:00PM please call eLink [phone] ? ?

## 2022-02-24 NOTE — Progress Notes (Signed)
eLink Physician-Brief Progress Note ?Patient Name: [name] ?DOB: Mar 16, 1971 ?MRN: [id number] ? ? ?Date of Service ? 02/24/2022  ?HPI/Events of Note ? Oliguria - Albumin < 1.5.  ?eICU Interventions ? Plan: ?25% Albumin 25 gm IV x 1 now.   ? ? ? ?Intervention Category ?Major Interventions: Other: ? ?Tanaya Dunigan Dennard Nip ?02/24/2022, 6:56 AM ?

## 2022-02-25 DIAGNOSIS — N289 Disorder of kidney and ureter, unspecified: Secondary | ICD-10-CM

## 2022-02-25 DIAGNOSIS — R4182 Altered mental status, unspecified: Secondary | ICD-10-CM | POA: Diagnosis not present

## 2022-02-25 DIAGNOSIS — G40919 Epilepsy, unspecified, intractable, without status epilepticus: Secondary | ICD-10-CM | POA: Diagnosis not present

## 2022-02-25 LAB — CBC
HCT: 22.1 % — ABNORMAL LOW (ref 39.0–52.0)
Hemoglobin: 6.9 g/dL — CL (ref 13.0–17.0)
MCH: 30 pg (ref 26.0–34.0)
MCHC: 31.2 g/dL (ref 30.0–36.0)
MCV: 96.1 fL (ref 80.0–100.0)
Platelets: 133 10*3/uL — ABNORMAL LOW (ref 150–400)
RBC: 2.3 MIL/uL — ABNORMAL LOW (ref 4.22–5.81)
RDW: 18.4 % — ABNORMAL HIGH (ref 11.5–15.5)
WBC: 7.5 10*3/uL (ref 4.0–10.5)
nRBC: 1.1 % — ABNORMAL HIGH (ref 0.0–0.2)

## 2022-02-25 LAB — PREPARE RBC (CROSSMATCH)

## 2022-02-25 LAB — GLUCOSE, CAPILLARY
Glucose-Capillary: 115 mg/dL — ABNORMAL HIGH (ref 70–99)
Glucose-Capillary: 122 mg/dL — ABNORMAL HIGH (ref 70–99)
Glucose-Capillary: 123 mg/dL — ABNORMAL HIGH (ref 70–99)
Glucose-Capillary: 123 mg/dL — ABNORMAL HIGH (ref 70–99)
Glucose-Capillary: 133 mg/dL — ABNORMAL HIGH (ref 70–99)
Glucose-Capillary: 90 mg/dL (ref 70–99)
Glucose-Capillary: 95 mg/dL (ref 70–99)
Glucose-Capillary: 98 mg/dL (ref 70–99)

## 2022-02-25 LAB — HEMOGLOBIN AND HEMATOCRIT, BLOOD
HCT: 30.1 % — ABNORMAL LOW (ref 39.0–52.0)
Hemoglobin: 9.5 g/dL — ABNORMAL LOW (ref 13.0–17.0)

## 2022-02-25 LAB — BASIC METABOLIC PANEL
Anion gap: 4 — ABNORMAL LOW (ref 5–15)
BUN: 24 mg/dL — ABNORMAL HIGH (ref 6–20)
CO2: 24 mmol/L (ref 22–32)
Calcium: 7.5 mg/dL — ABNORMAL LOW (ref 8.9–10.3)
Chloride: 112 mmol/L — ABNORMAL HIGH (ref 98–111)
Creatinine, Ser: 0.6 mg/dL — ABNORMAL LOW (ref 0.61–1.24)
GFR, Estimated: >60 mL/min (ref >60)
Glucose, Bld: 131 mg/dL — ABNORMAL HIGH (ref 70–99)
Potassium: 3.3 mmol/L — ABNORMAL LOW (ref 3.5–5.1)
Sodium: 140 mmol/L (ref 135–145)

## 2022-02-25 LAB — VALPROIC ACID LEVEL: Valproic Acid Lvl: 70 ug/mL (ref 50.0–100.0)

## 2022-02-25 LAB — MAGNESIUM: Magnesium: 1.9 mg/dL (ref 1.7–2.4)

## 2022-02-25 MED ORDER — PERAMPANEL 8 MG PO TABS
8.0000 mg | ORAL_TABLET | Freq: Once | ORAL | Status: DC
  Filled 2022-02-25: qty 4

## 2022-02-25 MED ORDER — LEVETIRACETAM IN NACL 1000 MG/100ML IV SOLN
1000.0000 mg | Freq: Two times a day (BID) | INTRAVENOUS | Status: DC
  Administered 2022-02-25 – 2022-03-03 (×11): 1000 mg via INTRAVENOUS
  Filled 2022-02-25 (×15): qty 100

## 2022-02-25 MED ORDER — LOPERAMIDE HCL 1 MG/7.5ML PO SUSP
2.0000 mg | ORAL | Status: DC | PRN
  Administered 2022-02-25 – 2022-02-27 (×2): 2 mg
  Filled 2022-02-25 (×3): qty 15

## 2022-02-25 MED ORDER — POTASSIUM CHLORIDE 10 MEQ/50ML IV SOLN
10.0000 meq | INTRAVENOUS | Status: AC
  Administered 2022-02-25 (×4): 10 meq via INTRAVENOUS
  Filled 2022-02-25 (×4): qty 50

## 2022-02-25 MED ORDER — POTASSIUM CHLORIDE 20 MEQ PO PACK
20.0000 meq | PACK | ORAL | Status: AC
  Administered 2022-02-25 (×2): 20 meq
  Filled 2022-02-25 (×2): qty 1

## 2022-02-25 MED ORDER — HYDROCORTISONE SOD SUC (PF) 100 MG IJ SOLR: 50.0000 mg | Freq: Two times a day (BID) | INTRAMUSCULAR | Status: DC

## 2022-02-25 MED ORDER — PERAMPANEL 8 MG PO TABS
8.0000 mg | ORAL_TABLET | Freq: Once | ORAL | Status: AC
  Administered 2022-02-25: 8 mg
  Filled 2022-02-25: qty 1

## 2022-02-25 MED ORDER — SODIUM CHLORIDE 0.9% IV SOLUTION: Freq: Once | INTRAVENOUS | Status: AC

## 2022-02-25 MED ORDER — MAGNESIUM SULFATE 2 GM/50ML IV SOLN
2.0000 g | Freq: Once | INTRAVENOUS | Status: AC
  Administered 2022-02-25: 2 g via INTRAVENOUS
  Filled 2022-02-25: qty 50

## 2022-02-25 MED ORDER — LOPERAMIDE HCL 2 MG PO CAPS
2.0000 mg | ORAL_CAPSULE | ORAL | Status: DC | PRN
  Filled 2022-02-25: qty 1

## 2022-02-25 NOTE — Procedures (Addendum)
Patient Name: [name]  ?MRN: [id number]  ?Epilepsy Attending: Charlsie Quest  ?Referring Physician/Provider: Simonne Martinet, NP ?Duration: 02/25/2022 0346 to 5/8/ 2023 0346 ?  ?Patient history: 51yo m with h/o seizure now with ams. EEG to evaluate for seizure ?  ?Level of alertness: lethargic  ?  ?AEDs during EEG study: VPA, LEV ?  ?Technical aspects: This EEG study was done with scalp electrodes positioned according to the 10-20 International system of electrode placement. Electrical activity was acquired at a sampling rate of 500Hz  and reviewed with a high frequency filter of 70Hz  and a low frequency filter of 1Hz . EEG data were recorded continuously and digitally stored.  ?  ?Description: EEG showed continuous generalized 3 to 6 Hz theta-delta slowing. Lateralized periodic discharges with overriding fast activity ( LPD+F) was noted in left posterior quadrant at 1hz . Hyperventilation and photic stimulation were not performed.    ?  ?ABNORMALITY  ?- Lateralized periodic discharges with overriding fast activity ( LPD +) left posterior quadrant ?- Continuous slow, generalized ?  ?IMPRESSION: ?This study  showed evidence of epileptogenicity arising from left posterior quadrant with increased risk for seizure recurrence. Additionally there is  ?moderate to severe diffuse encephalopathy, nonspecific etiology. No seizures were seen throughout the recording. ?  ?  ?  ?

## 2022-02-25 NOTE — Progress Notes (Signed)
Subjective: ?Continues to have right-sided weakness which family states is new ? ?Exam: ?Vitals:  ? 02/25/22 0900 02/25/22 1000  ?BP: 126/82 127/82  ?Pulse: 63 76  ?Resp: 17 19  ?Temp:  (!) 97.5 ?F (36.4 ?C)  ?SpO2: 100% 99%  ? ?Gen: In bed, NAD ?Resp: non-labored breathing, no acute distress ?Abd: soft, nt ? ?Neuro: ?MS: Opens eyes, but does not follow commands, he tracks me around the room ?CN: Crosses midline in both directions, unclear blink to threat as he does not from either side ?Motor: He has severe right arm >right leg weakness ?Sensory: Response to noxious stim x4 ? ?Pertinent Labs: ?Albumin less than 1.5, calcium 7.4 ?Hemoglobin 7.3 ?Creatinine 0.76 ? ?Impression: 51 year old male with history of TBI, focal seizures on Depakote at home who was admitted with sepsis due to osteomyelitis.  He was admitted on the 28th with hypotension and altered mental status concerning for sepsis.  On 5/4 an EEG was performed which demonstrated PLEDs plus, he was loaded with Keppra and transferred to [facility] for continuous EEG. ? ?He continues to have epileptogenic periodic discharges on the right, with persistent right negative deficits.  Given that he is not intubated, I would favor not using heavily sedating medications, but I will start perampanel with a single 8 mg dose ? ?Recommendations: ?1) continue Depakote 800 3 times daily ?2) increase Keppra to 1 g twice daily ?3) Vimpat 200 twice daily ?4) perampanel 8 mg x 1 ? ?Ritta Slot, MD ?[facility] ?[phone] ? ?If 7pm- 7am, please page neurology on call as listed in AMION. ? ?

## 2022-02-25 NOTE — Progress Notes (Signed)
Critical H&H of 6.9, CCM notified ?

## 2022-02-25 NOTE — Evaluation (Signed)
Physical Therapy Evaluation ?Patient Details ?Name: [name] ?MRN: [id number] ?DOB: 09/23/1971 ?Today's Date: 02/25/2022 ? ?History of Present Illness ? 51 y.o. male presents to [facility] hospital on 01/21/2022 with hypotension, pt discharged 2 days earlier with management for sepsis due to osteomyelitis. On 5/4 an EEG was performed which demonstrated periodic lateralized epileptiform discharges. PMH includes SCI with quadriplegia, osteomyelitis of sacrum, seizures, R BKA, L AKA, TBI.  ?Clinical Impression ? Pt presents to PT with deficits in strength, balance, power, communication, cognition, cardiopulmonary function. Pt does not follow commands during session. PT notes spontaneous and possibly purposeful movement of LUE but no AROM of other limbs at this time. Pt requires totalA to roll currently, while recently being able to perform bed mobility with just minA while hospitalized in April. Pt will benefit from continued acute PT services in an effort to improve mobility and reduce caregiver burden. PT recommends SNF placement at this time. ?   ? ?Recommendations for follow up therapy are one component of a multi-disciplinary discharge planning process, led by the attending physician.  Recommendations may be updated based on patient status, additional functional criteria and insurance authorization. ? ?Follow Up Recommendations Skilled nursing-short term rehab (<3 hours/day) ? ?  ?Assistance Recommended at Discharge Frequent or constant Supervision/Assistance  ?Patient can return home with the following ? Two people to help with walking and/or transfers;Two people to help with bathing/dressing/bathroom;Assistance with cooking/housework;Assistance with feeding;Direct supervision/assist for medications management;Direct supervision/assist for financial management;Assist for transportation;Help with stairs or ramp for entrance ? ?  ?Equipment Recommendations  (hoyer lift)  ?Recommendations for Other Services ?    ?   ?Functional Status Assessment Patient has had a recent decline in their functional status and demonstrates the ability to make significant improvements in function in a reasonable and predictable amount of time.  ? ?  ?Precautions / Restrictions Precautions ?Precautions: Fall;Other (comment) ?Precaution Comments: sacral wound, bilateral hip wounds, L AKA, R BKA ?Restrictions ?Weight Bearing Restrictions: No  ? ?  ? ?Mobility ? Bed Mobility ?Overal bed mobility: Needs Assistance ?Bed Mobility: Rolling ?Rolling: Total assist, +2 for physical assistance ?  ?  ?  ?  ?General bed mobility comments: pt does appear to attempt to initiate reaching back to roll onto back mid-dressing change ?  ? ?Transfers ?  ?  ?  ?  ?  ?  ?  ?  ?  ?  ?  ? ?Ambulation/Gait ?  ?  ?  ?  ?  ?  ?  ?  ? ?Stairs ?  ?  ?  ?  ?  ? ?Wheelchair Mobility ?  ? ?Modified Rankin (Stroke Patients Only) ?  ? ?  ? ?Balance Overall balance assessment:  (sitting not assessed as pt total to roll and not following commands) ?  ?  ?  ?  ?  ?  ?  ?  ?  ?  ?  ?  ?  ?  ?  ?  ?  ?  ?   ? ? ? ?Pertinent Vitals/Pain Pain Assessment ?Pain Assessment: CPOT ?Facial Expression: Tense ?Body Movements: Protection ?Muscle Tension: Relaxed ?Compliance with ventilator (intubated pts.): N/A ?Vocalization (extubated pts.): N/A ?CPOT Total: 2  ? ? ?Home Living Family/patient expects to be discharged to:: Private residence ?Living Arrangements: Parent ?Available Help at Discharge: Family;Available PRN/intermittently ?Type of Home: House ?Home Access: Ramped entrance ?  ?  ?  ?Home Layout: One level ?Home Equipment: Wheelchair - manual;Other (comment) (bed rail) ?Additional  Comments: history recorded from recent prior admission as pt does not communicate verbally during session  ?  ?Prior Function Prior Level of Function : Independent/Modified Independent ?  ?  ?  ?  ?  ?  ?Mobility Comments: Pt independent with transfers to w/c - lateral scoots no sliding board (obtained from PT  eval on 02/11/2022) ?  ?  ? ? ?Hand Dominance  ? Dominant Hand: Right ? ?  ?Extremity/Trunk Assessment  ? Upper Extremity Assessment ?Upper Extremity Assessment: RUE deficits/detail;LUE deficits/detail ?RUE Deficits / Details: PROM WFL, RUE flaccid, no spasticity noted, no AROM noted, grimaces to painful stimulation, no withdrawal noted ?LUE Deficits / Details: PROM WFL, elbow extensor spasticity noted, pt does spontaneously move LUE, appears purposeful when reaching back in an attempt to roll onto back during dressing change ?  ? ?Lower Extremity Assessment ?Lower Extremity Assessment: RLE deficits/detail;LLE deficits/detail ?RLE Deficits / Details: BKA, edematous, no AROM noted, PROM assessment limited due to bowel incontinence ?LLE Deficits / Details: AKA, edematous, no AROM noted, PROM assessment limited due to bowel incontinence ?  ? ?Cervical / Trunk Assessment ?Cervical / Trunk Assessment: Normal  ?Communication  ? Communication: Receptive difficulties;Expressive difficulties (pt does not appear to attempt to communicate, does not follow commands)  ?Cognition Arousal/Alertness: Awake/alert (intermittent periods of eyes closed but does re-open eyes with stimulation) ?Behavior During Therapy: Flat affect ?Overall Cognitive Status: History of cognitive impairments - at baseline ?  ?  ?  ?  ?  ?  ?  ?  ?  ?  ?  ?  ?  ?  ?  ?  ?General Comments: pt does not communicate verbally, does not follow commands during session ?  ?  ? ?  ?General Comments General comments (skin integrity, edema, etc.): pt with periods of desaturation during rolling but wildly fluctuating possibly due to inaccurate pleth reading. Pt on 5L [place], wet breath sounds. Incontinent of stool. Sacral wound along with bilateral hip wounds ? ?  ?Exercises    ? ?Assessment/Plan  ?  ?PT Assessment Patient needs continued PT services  ?PT Problem List Decreased strength;Decreased activity tolerance;Decreased balance;Decreased mobility;Decreased  cognition;Decreased knowledge of use of DME;Decreased safety awareness;Decreased knowledge of precautions;Cardiopulmonary status limiting activity;Impaired tone;Pain ? ?   ?  ?PT Treatment Interventions DME instruction;Functional mobility training;Therapeutic activities;Therapeutic exercise;Balance training;Neuromuscular re-education;Patient/family education;Cognitive remediation;Wheelchair mobility training   ? ?PT Goals (Current goals can be found in the Care Plan section)  ?Acute Rehab PT Goals ?Patient Stated Goal: pt unable to state, PT goal to return to performing transfers to wheelchair with assistance ?PT Goal Formulation: Patient unable to participate in goal setting ?Time For Goal Achievement: 03/11/22 ?Potential to Achieve Goals: Fair ?Additional Goals ?Additional Goal #1: Pt will utilize BUE to propel manual wheelchair 25' with modA ? ?  ?Frequency Min 2X/week ?  ? ? ?Co-evaluation   ?  ?  ?  ?  ? ? ?  ?AM-PAC PT "6 Clicks" Mobility  ?Outcome Measure Help needed turning from your back to your side while in a flat bed without using bedrails?: Total ?Help needed moving from lying on your back to sitting on the side of a flat bed without using bedrails?: Total ?Help needed moving to and from a bed to a chair (including a wheelchair)?: Total ?Help needed standing up from a chair using your arms (e.g., wheelchair or bedside chair)?: Total ?Help needed to walk in hospital room?: Total ?Help needed climbing 3-5 steps with a railing? : Total ?  6 Click Score: 6 ? ?  ?End of Session   ?Activity Tolerance: Patient tolerated treatment well ?Patient left: in bed;with call bell/phone within reach;with bed alarm set ?Nurse Communication: Mobility status ?PT Visit Diagnosis: Other abnormalities of gait and mobility (R26.89);Muscle weakness (generalized) (M62.81);Other symptoms and signs involving the nervous system (R29.898) ?  ? ?Time: [id number] ?PT Time Calculation (min) (ACUTE ONLY): 48 min ? ? ?Charges:   PT  Evaluation ?$PT Eval High Complexity: 1 High ?  ?  ?   ? ? ?Arlyss Gandyyan J Rhyker Silversmith, PT, DPT ?Acute Rehabilitation ?Pager: [phone] ?Office [phone] ? ? ?Arlyss Gandyyan J Yenni Carra ?02/25/2022, 10:59 AM ? ?

## 2022-02-25 NOTE — Progress Notes (Signed)
[facility] ADULT ICU REPLACEMENT PROTOCOL ? ? ?The patient does apply for the [facility] Adult ICU Electrolyte Replacment Protocol based on the criteria listed below:  ? ?1.Exclusion criteria: TCTS patients, ECMO patients, and Dialysis patients ?2. Is GFR >/= 30 ml/min? Yes.    ?Patient's GFR today is >60 ?3. Is SCr </= 2? Yes.   ?Patient's SCr is 0.60 mg/dL ?4. Did SCr increase >/= 0.5 in 24 hours? No. ?5.Pt's weight >40kg  Yes.   ?6. Abnormal electrolyte(s): K, Mag  ?7. Electrolytes replaced per protocol ?8.  Call MD STAT for K+ </= 2.5, Phos </= 1, or Mag </= 1 ?Physician:  Arsenio Loader ? ?[name] 02/25/2022 6:17 AM  ?

## 2022-02-25 NOTE — Progress Notes (Signed)
EEG maint complete.  ?

## 2022-02-25 NOTE — Progress Notes (Signed)
? ?NAME:  [name], MRN:  [id number], DOB:  1971-09-17, LOS: 9 ?ADMISSION DATE:  02/01/2022, CONSULTATION DATE:  01/20/2022 ?REFERRING MD:  Dr. Deretha Emory, CHIEF COMPLAINT:  Hypotension with concern for sepsis  ? ?History of Present Illness:  ?[name] is a 51yo male with PMH significant for spinal cord injury, quadriplegia, osteomyelitis of sacrum, neurogenic bladder requiring in and out caths, seizures, and prior wound dehiscence who presented to the [facility] ED via EMS with reports of hypotension. Of note patient was just discharged from this facility 2 days prior for management of sepsis with bacteremia secondary to osteomyelitis.  ? ?On ED arrival patient was seen hypotensive with all other vitals WNL. Lab work significant for NA 131, glucose 42, creatinine 0.53, albumin <1.5, Hgb 8.5, lactic normal at 1.5, INR 1.4. Given hypotension with concern for need of pressor support PCCM consulted for further management and admission   ? ?Pertinent  Medical History  ?spinal cord injury, quadriplegia, osteomyelitis of sacrum, neurogenic bladder requiring in and out caths, seizures, and prior wound dehiscence ? ?Significant Hospital Events: ?Including procedures, antibiotic start and stop dates in addition to other pertinent events   ?4/28 patient presented to ED after home health nurse identified hypotension.  Concern for recurrent sepsis.  Of note patient was just discharged from this facility 2 days prior ?4/29 PICC line placed ?5/1 still on levophed. Passed SLP swallow eval for mech soft diet.  ?5/2 ID recommending 4 weeks CTX and oral flagyl then f/u in ID clinic ?5/3 still on pressors. Goal SBP changed to > 90. Increased midodrine to 10 mg tid.  Refused medications intermittently throughout the day.  Remained on pressors.  Several episodes of hypoglycemia, growing concern for encephalopathy ?5/4 encephalopathic on a.m. rounds, glucose 78, placed on D10 during evening rounds, stress dose steroids  ordered.  ?5/5 Remains encephalopathic with intermittent agitation   ?5/6 Renal US given oliguria  ?5/7 Desaturated yesterday, back on 5L today. Renal US without significant findings, EEG with evidence of epileptogenicity but no seizure  ? ?Interim History / Subjective:  ? ?Received IVF bolus yesterday, still just 500cc UOP ?Increased secretions  ?Watery brown diarrhea making sacral decub wound care difficult ? ?Objective   ?Blood pressure 127/82, pulse 76, temperature (!) 97.5 ?F (36.4 ?C), temperature source Oral, resp. rate 19, height 5\' 3"  (1.6 m), weight 74.2 kg, SpO2 99 %. ?   ?FiO2 (%):  [40 %] 40 %  ? ?Intake/Output Summary (Last 24 hours) at 02/25/2022 1008 ?Last data filed at 02/25/2022 1000 ?Gross per 24 hour  ?Intake 2327.65 ml  ?Output 535 ml  ?Net 1792.65 ml  ? ? ?Filed Weights  ? 02/23/22 0127 02/24/22 0425 02/25/22 0458  ?Weight: 69.1 kg 74.9 kg 74.2 kg  ? ? ? ?General:  ill-appearing M, sitting up in bed lethargic and minimally interactive  ?HEENT: MM pink/moist, sclera anicteric  ?Neuro: follows minimal commands, fatigued, not answering questions ?CV: s1s2 rrr, no m/r/g ?PULM:  course breath sounds throughout, on 5L [place] without respiratory distress ?GI/GU: soft, non-distended, scrotal edema, brown watery stool ?Extremities: warm/dry, 1+ edema, BLE amputations ?Skin: no rashes or lesions anteriorly  ? ? ? ? ?Resolved Hospital Problem list   ?Lactic Acidosis ? ?Assessment & Plan:  ? ?Sepsis due to sacral osteomyelitis ?Sacral decubitus ulcers ?-Previous Staph epi bacteremia 4/23 ?P: ?-continue 4wk course rocephin, flagyl  ?-add prn imodium for diarrhea ? ?Hypotension, improving  ? -- chronic component + hypovolemia + improved shock ?No current  worsening ?P ?-continue midodrine ?-Taper stress dose steroids from Solumedrol 50mg  q6hrs to q12hrs  ? 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 ?AKI with oliguria ?Hx neurogenic bladder ?Creatinine improved, but still minimal UOP, +13L since admission ?P: ?-renal without acute findings  ?-500 ml LR bolus yesterday, don't think needs more volume today ?-cont foley for now with change in renal fxn, but has been in for 7d -- could change to [facility] I/O  ? ?Hx cervical cord injury with resultant paraplegia  ?Hx R BKA, L AKA ?P: ?-supportive care ?-PO, OT  ? ?Severe protein calorie malnutrition  ?-Albumin less than 1.5 on admission ?P: ?-EN via cortrak  ? ?Chronic anemia + anemia of critical disease  ?Hgb 6.9 today and transfused 1 unit PRBC's ?P: ?-continue to trend CBC ? ? ?Best Practice (right click and "Reselect all SmartList Selections" daily)  ? ?Diet/type: tubefeeds ?DVT prophylaxis: prophylactic heparin  ?GI prophylaxis: PPI ?Lines: Central line PICC ?Foley:  N/A and Yes, and it is still needed ?Code Status:  full code ?Last date of multidisciplinary goals of care discussion: family updated 5/5  ? ?CRITICAL CARE ?Performed by: [facility] Ryla Cauthon ? ?Total critical care time: 38 minutes ? ?Critical care time was exclusive of separately billable procedures and treating other patients. ?Critical care was necessary to treat or prevent imminent or life-threatening deterioration. ? ?Critical care was time spent personally by me on the following activities: development of treatment plan with patient and/or surrogate as well as nursing, discussions with consultants, evaluation of patient's response to treatment, examination of patient, obtaining history from patient or surrogate, ordering and performing treatments and interventions, ordering and review of laboratory studies, ordering and review of radiographic studies, pulse oximetry and re-evaluation of  patient's condition. ? ?Darcella Gasman Clema Skousen, PA-C ?[facility] ?See Amion for pager ?If no response to pager , please call [phone] until 7pm ?After 7:00 pm call Elink  [phone] ? ? ? ?

## 2022-02-25 NOTE — Progress Notes (Signed)
Informed consent received from Areta Haber (Mother), at 0600, understood the need of blood transfusion, this Rn and Badi, RN took the verbal consent.  ?

## 2022-02-25 NOTE — Progress Notes (Signed)
eLink Physician-Brief Progress Note ?Patient Name: [name] ?DOB: 10-20-1971 ?MRN: [id number] ? ? ?Date of Service ? 02/25/2022  ?HPI/Events of Note ? Anemia - Hgb = 6.9.   ?eICU Interventions ? Will transfuse 1 unit PRBC.  ? ? ? ?Intervention Category ?Major Interventions: Other: ? ?[name] ?02/25/2022, 5:20 AM ?

## 2022-02-26 ENCOUNTER — Inpatient Hospital Stay (HOSPITAL_COMMUNITY): Payer: Medicare Other

## 2022-02-26 DIAGNOSIS — S14109S Unspecified injury at unspecified level of cervical spinal cord, sequela: Secondary | ICD-10-CM

## 2022-02-26 DIAGNOSIS — G40919 Epilepsy, unspecified, intractable, without status epilepticus: Secondary | ICD-10-CM | POA: Diagnosis not present

## 2022-02-26 DIAGNOSIS — R4182 Altered mental status, unspecified: Secondary | ICD-10-CM | POA: Diagnosis not present

## 2022-02-26 LAB — TYPE AND SCREEN
ABO/RH(D): O NEG
Antibody Screen: NEGATIVE
Unit division: 0

## 2022-02-26 LAB — GLUCOSE, CAPILLARY
Glucose-Capillary: 104 mg/dL — ABNORMAL HIGH (ref 70–99)
Glucose-Capillary: 101 mg/dL — ABNORMAL HIGH (ref 70–99)
Glucose-Capillary: 81 mg/dL (ref 70–99)
Glucose-Capillary: 86 mg/dL (ref 70–99)
Glucose-Capillary: 94 mg/dL (ref 70–99)

## 2022-02-26 LAB — CBC
HCT: 25.4 % — ABNORMAL LOW (ref 39.0–52.0)
Hemoglobin: 8.1 g/dL — ABNORMAL LOW (ref 13.0–17.0)
MCH: 29.8 pg (ref 26.0–34.0)
MCHC: 31.9 g/dL (ref 30.0–36.0)
MCV: 93.4 fL (ref 80.0–100.0)
Platelets: UNDETERMINED 10*3/uL (ref 150–400)
RBC: 2.72 MIL/uL — ABNORMAL LOW (ref 4.22–5.81)
RDW: 19.1 % — ABNORMAL HIGH (ref 11.5–15.5)
WBC: 10.9 10*3/uL — ABNORMAL HIGH (ref 4.0–10.5)
nRBC: 1.7 % — ABNORMAL HIGH (ref 0.0–0.2)

## 2022-02-26 LAB — BASIC METABOLIC PANEL
Anion gap: 6 (ref 5–15)
BUN: 21 mg/dL — ABNORMAL HIGH (ref 6–20)
CO2: 23 mmol/L (ref 22–32)
Calcium: 7.3 mg/dL — ABNORMAL LOW (ref 8.9–10.3)
Chloride: 113 mmol/L — ABNORMAL HIGH (ref 98–111)
Creatinine, Ser: 0.48 mg/dL — ABNORMAL LOW (ref 0.61–1.24)
GFR, Estimated: >60 mL/min (ref >60)
Glucose, Bld: 123 mg/dL — ABNORMAL HIGH (ref 70–99)
Potassium: 3.2 mmol/L — ABNORMAL LOW (ref 3.5–5.1)
Sodium: 142 mmol/L (ref 135–145)

## 2022-02-26 LAB — BPAM RBC
Blood Product Expiration Date: 202305132359
ISSUE DATE / TIME: 202305070744
Unit Type and Rh: 9500

## 2022-02-26 LAB — AMMONIA: Ammonia: 33 umol/L (ref 9–35)

## 2022-02-26 MED ORDER — POTASSIUM CHLORIDE 10 MEQ/50ML IV SOLN
10.0000 meq | INTRAVENOUS | Status: AC
  Administered 2022-02-26 (×4): 10 meq via INTRAVENOUS
  Filled 2022-02-26 (×3): qty 50

## 2022-02-26 MED ORDER — POTASSIUM CHLORIDE CRYS ER 20 MEQ PO TBCR
20.0000 meq | EXTENDED_RELEASE_TABLET | ORAL | Status: AC
  Administered 2022-02-26: 20 meq via ORAL
  Filled 2022-02-26 (×2): qty 1

## 2022-02-26 MED ORDER — MIDODRINE HCL 5 MG PO TABS
20.0000 mg | ORAL_TABLET | Freq: Three times a day (TID) | ORAL | Status: DC
  Administered 2022-02-26 – 2022-03-03 (×15): 20 mg
  Filled 2022-02-26 (×15): qty 4

## 2022-02-26 MED ORDER — LACTATED RINGERS IV BOLUS
500.0000 mL | Freq: Once | INTRAVENOUS | Status: AC
  Administered 2022-02-26: 500 mL via INTRAVENOUS

## 2022-02-26 NOTE — Progress Notes (Signed)
Subjective: Patient continues to be minimally responsive.  Sisters and daughters at bedside. ? ?ROS: Unable to obtain due to poor mental status ? ?Examination ? ?Vital signs in last 24 hours: ?Temp:  [96.3 ?F (35.7 ?C)-98.1 ?F (36.7 ?C)] 97.4 ?F (36.3 ?C) (05/08 0800) ?Pulse Rate:  [57-92] 57 (05/08 1000) ?Resp:  [15-25] 16 (05/08 1000) ?BP: (85-131)/(38-90) 101/64 (05/08 1000) ?SpO2:  [89 %-100 %] 98 % (05/08 1000) ?Weight:  [78 kg] 78 kg (05/08 0500) ? ?General: lying in bed, on O2 ?Neuro: Opens eyes to verbal stimulation but does not follow commands, PERRLA, does appear to track examiner in the room at times, blinks to threat bilaterally, withdraws to noxious stimuli in left more than right upper extremity ? ? ?Basic Metabolic Panel: ?Recent Labs  ?Lab 02/21/22 ?0240 02/22/22 ?3546 02/22/22 ?1024 02/22/22 ?1408 02/22/22 ?2014 02/23/22 ?0442 02/23/22 ?1700 02/24/22 ?0424 02/25/22 ?0425 02/26/22 ?0408  ?NA 139   < > 138  --   --  134*  --  135 140 142  ?K 3.3*   < > 3.7  --   --  4.1  --  3.6 3.3* 3.2*  ?CL 110   < > 112*  --   --  105  --  107 112* 113*  ?CO2 27   < > 22  --   --  22  --  23 24 23   ?GLUCOSE 82   < > 92  --   --  138*  --  123* 131* 123*  ?BUN 9   < > 7  --   --  7  --  20 24* 21*  ?CREATININE 0.58*   < > 0.60*  --   --  0.71  --  0.76 0.60* 0.48*  ?CALCIUM 8.0*   < > 8.1*  --   --  7.8*  --  7.4* 7.5* 7.3*  ?MG 1.7   < >  --  1.8 1.7 1.5* 2.3  --  1.9  --   ?PHOS 3.0  --   --  3.5 3.6 3.5 3.5  --   --   --   ? < > = values in this interval not displayed.  ? ? ?CBC: ?Recent Labs  ?Lab 02/22/22 ?04/24/22 02/23/22 ?04/25/22 02/24/22 ?0424 02/25/22 ?0425 02/25/22 ?1208 02/26/22 ?0408  ?WBC 11.1* 8.6 7.0 7.5  --  10.9*  ?HGB 7.2* 7.9* 7.3* 6.9* 9.5* 8.1*  ?HCT 22.5* 24.1* 23.2* 22.1* 30.1* 25.4*  ?MCV 94.1 91.3 94.3 96.1  --  93.4  ?PLT 149* 153 134* 133*  --  PLATELET CLUMPS NOTED ON SMEAR, UNABLE TO ESTIMATE  ? ? ? ?Coagulation Studies: ?No results for input(s): LABPROT, INR in the last 72 hours. ? ?Imaging   ?CTH wo contrast 02/23/2022: No acute intracranial abnormality. Old left occipital infarct and chronic microvascular ischemia. ? ? 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  ? ?Epilepsy with breakthrough seizure ?Osteomyelitis ?Right hemiparesis ?Acute encephalopathy ?- LTM eeg showed evidence of epileptogenicity arising from left posterior quadrant with increased risk for seizure recurrence. Additionally there is  ?moderate to severe diffuse encephalopathy, nonspecific etiology. No seizures were seen throughout the recording. ?-Encephalopathy likely due to seizures, medications, infection ? ?Recommendations ?- Continue LEV 1000mg  BID, LCM 200mg  BID and VPA  800mg  Q6h ?- Will check ammonia to see if it is contributing to encephalopathy ?- Continue LTM eeg overnight to look for intermittent seizures ?-We will hold off on adding more AEDs as ICU team recommended intubation but family does not think patient would want that. ?-Would also like to obtain MRI brain.  However patient is unable to lay still in the MRI, therefore may not be high yield. ?- Continue seizure precautions ?-PRN IV ativan for GTC seizure lasting > ?- Management of rest of comorbidities per primary team ?-Discussed plan with critical care team, patient's family at bedside. ? ?I have spent a total of  35  minutes with the patient reviewing hospital notes,  test results, labs and examining the patient as well as establishing an assessment and plan.  > 50% of time was spent in direct patient care. ?  ? ? ?Epilepsy ?[facility] ?For questions after 5pm please refer to AMION to reach the Neurologist on call ? ?

## 2022-02-26 NOTE — IPAL (Signed)
?  Interdisciplinary Goals of Care Family Meeting ? ? ?Date carried out:: 02/26/2022 ? ?Location of the meeting: Bedside ? ?Member's involved: Physician, Bedside Registered Nurse, and Family Member or next of kin ? ?Durable Power of Attorney or acting medical decision maker: Abbas Beyene ? ?Discussion: We discussed goals of care for [facility] .   ? ?The Clinical status was relayed to patient's mother over the phone and the patient's brother at bedside in detail. ?  ?Updated and notified of patients medical condition. ?  ?  ?Patient remains minimally responsive, having a weak cough and struggling to remove secretions.   ?Patient with increased WOB and using accessory muscles to breathe ?  ?Patient with Progressive multiorgan failure with a very high probablity of a very minimal chance of meaningful recovery despite all aggressive and optimal medical therapy. ? ?Code status: Full DNR ? ?Disposition: Continue current acute care, if patient fails they would like to proceed with comfort care in the next few days ? ?DNR orders were written ? ?  ?Family are satisfied with Plan of action and management. All questions answered ?  ?Cheri Fowler MD ?[facility] [facility] ?See Amion for pager ?If no response to pager, please call [phone] until 7pm ?After 7pm, Please call E-link [phone] ? ? ? ?

## 2022-02-26 NOTE — Progress Notes (Signed)
? ?NAME:  [name], MRN:  [id number], DOB:  May 05, 1971, LOS: 10 ?ADMISSION DATE:  01/31/2022, CONSULTATION DATE:  01/30/2022 ?REFERRING MD:  Dr. Rogene Houston, CHIEF COMPLAINT:  Hypotension with concern for sepsis  ? ?History of Present Illness:  ?51 year old man who presented to [facility] ED 4/28 via EMS for hypotension. Of note patient was just discharged from this facility 2 days prior for management of sepsis with bacteremia secondary to osteomyelitis. PMHx significant for spinal cord injury, quadriplegia, osteomyelitis of sacrum, L AKA/R BKA, neurogenic bladder requiring in and out caths, seizures, and prior wound dehiscence  ? ?On ED arrival patient was seen hypotensive with all other vitals WNL. Lab work significant for NA 131, glucose 42, creatinine 0.53, albumin <1.5, Hgb 8.5, lactic normal at 1.5, INR 1.4. Given hypotension with concern for need of pressor support PCCM consulted for further management and admission   ? ?Pertinent Medical History:  ?spinal cord injury, quadriplegia, osteomyelitis of sacrum, neurogenic bladder requiring in and out caths, seizures, and prior wound dehiscence; L AKA, R BKA ? ?Significant Hospital Events: ?Including procedures, antibiotic start and stop dates in addition to other pertinent events   ?4/28 patient presented to ED after home health nurse identified hypotension.  Concern for recurrent sepsis.  Of note patient was just discharged from this facility 2 days prior ?4/29 PICC line placed ?5/1 still on levophed. Passed SLP swallow eval for mech soft diet.  ?5/2 ID recommending 4 weeks CTX and oral flagyl then f/u in ID clinic ?5/3 still on pressors. Goal SBP changed to > 90. Increased midodrine to 10 mg tid.  Refused medications intermittently throughout the day.  Remained on pressors.  Several episodes of hypoglycemia, growing concern for encephalopathy ?5/4 encephalopathic on a.m. rounds, glucose 78, placed on D10 during evening rounds, stress dose steroids ordered.   ?5/5 Remains encephalopathic with intermittent agitation   ?5/6 Renal US given oliguria  ?5/7 Desaturated yesterday, back on 5L today. Renal US without significant findings, EEG with evidence of epileptogenicity but no seizure  ?5/8 Worsening respiratory status with increased secretions, poor airway clearance; requiring increased O2 support (Venturi mask 6L today). Intermittently hypotensive, ongoing periodic epileptogenic discharges on R. ? ?Interim History / Subjective:  ?Hypotensive overnight, responded to fluid resuscitation ?Ongoing increasing respiratory support needs ?Poor airway clearance with gurgling secretions, requiring frequent suctioning ?Venturi mask 6L to maintain sats today ?Per Neuro, ongoing periodic epileptogenic discharges on EEG despite multiple AEDs ?Clinically worsening overall ?[place] discussion today ? ?Objective:   ?Blood pressure (!) 85/55, pulse 78, temperature (!) 97.1 ?F (36.2 ?C), temperature source Axillary, resp. rate 19, height 5\' 3"  (1.6 m), weight 78 kg, SpO2 100 %. ?   ?   ? ?Intake/Output Summary (Last 24 hours) at 02/26/2022 0756 ?Last data filed at 02/26/2022 0700 ?Gross per 24 hour  ?Intake 2843.51 ml  ?Output 765 ml  ?Net 2078.51 ml  ? ? ?Filed Weights  ? 02/24/22 0425 02/25/22 0458 02/26/22 0500  ?Weight: 74.9 kg 74.2 kg 78 kg  ? ?Physical Examination: ?General: Acute-on-chronically ill-appearing middle-aged man in NAD. Lethargic. ?HEENT: [place]/AT, anicteric sclera, PERRL, moist mucous membranes. Cortrak/Venturi mask in place. ?Neuro: Lethargic. Responds to verbal stimuli. Following commands consistently. No spontaneous movement of extremities noted in the setting of quadriplegia. +Corneal, +Cough, and +Gag  ?CV: RRR, no m/g/r. ?PULM: Breathing even and mildly labored on Venturi mask 6L. Loud, gurgling upper airway secretions. Lung fields with coarse rhonchi on R, diminished on L. ?GI: Soft, nontender, nondistended. Normoactive bowel  sounds. ?Extremities: Bilateral symmetric 1-2+  proximal LE edema noted. L AKA/R BKA with well-healed amputation sites. ?GU: Significant 3+ scrotal edema. ?Skin: Warm/dry, no rashes. ? ?Resolved Hospital Problem List:   ?Lactic Acidosis ? ?Assessment & Plan:  ? ?Sepsis due to sacral osteomyelitis ?Sacral decubitus ulcers, POA ?Previous Staph epi bacteremia 4/23 ?- Ceftriaxone/Flagyl x 4 week course (end ~5/30) ?- F/u finalized Cx data ?- WOC for chronic sacral decubitus wounds ?- Imodium PRN for diarrhea ? ?Hypotension, improving  ?Chronic component + hypovolemia + improved shock ?- Hypotensive with MAPs high 50s overnight, responded to fluid boluses ?- Goal MAP > 60-65 ?- Fluid resuscitation as able ?- Continue midodrine ?- Taper SDS (50mg  Q6H to Q12H) ? 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 Baseline per family he completes ADLs himself, schedules his appointments/rides -- fairly independent. ?- Neuro following, appreciate recommendations ?- Continue LTM EEG; ongoing periodic epileptiform discharges on R, no seizures ?- AEDs per Neuro (Keppra, Vimpat, valproate, Fycompa x 1) ?- Frequent neuro exams ?- Neuroprotective measures: HOB > 30 degrees, normoglycemia, normothermia, electrolytes WNL ? ?AKI with oliguria ?Hx neurogenic bladder ?Creatinine improved, but still minimal UOP, +13L since admission Renal US without acute findings. ?- Trend BMP ?- Replete electrolytes as indicated ?- Monitor I&Os ?- F/u urine studies ?- Avoid nephrotoxic agents as able ?- Ensure adequate renal perfusion ?- Continue Foley for now, consider discontinuation of I&O cath as appropriate ? ?Hx cervical cord injury with resultant paraplegia  ?Hx R BKA, L AKA ?- Supportive care ?- PT/OT as able to participate in care ? ?Severe protein calorie malnutrition  ?Albumin less than 1.5 on admission ?- TF via Cortrak ?- Appreciate Nutrition/RD assistance ? ?Chronic anemia, anemia of critical disease  ?Hgb 6.9 today and  transfused 1 unit PRBC's ?- Trend CBC ?- Transfuse for Hgb < 7.0 or hemodynamically significant bleeding ? ?[place] ?- Revisiting GOC today, 5/8 given Peterson's clinical worsening from both a respiratory/hemodynamic standpoint ?- Aggressive ongoing care vs. DNR vs. Comfort measures ? ?Best Practice: (right click and "Reselect all SmartList Selections" daily)  ? ?Diet/type: tubefeeds ?DVT prophylaxis: prophylactic heparin  ?GI prophylaxis: PPI ?Lines: Central line PICC ?Foley:  N/A and Yes, and it is still needed ?Code Status:  full code ?Last date of multidisciplinary goals of care discussion: family updated 5/5, revisit GOC/discussion 5/8 given clinical worsening ? ?Critical care time: 42 minutes  ? ?Lestine Mount, PA-C ?[facility] ?02/26/22 7:57 AM ? ?Please see [email or web address] for pager details. ? ?From 7A-7P if no response, please call [phone] ?After hours, please call ELink [phone] ?

## 2022-02-26 NOTE — Progress Notes (Signed)
Pt's BP trending down, CCM notified ?

## 2022-02-26 NOTE — Progress Notes (Signed)
LTM maint complete 

## 2022-02-26 NOTE — Progress Notes (Addendum)
SLP Cancellation Note ? ?Patient Details ?Name: [name] ?MRN: [id number] ?DOB: 06/23/1971 ? ? ?Cancelled treatment:        Had planned to see pt for po trials versus MBS however note read from interdisciplinary meeting. Pt is minimally responsive with shallow breathing and unable to move secretions. Family will see how he does over next to determine plan of care. ST plans to check back Wed. If needed prior to then please call [phone].  ? ? ?Royce Macadamia ?02/26/2022, 1:26 PM ?

## 2022-02-26 NOTE — Procedures (Addendum)
Patient Name: [name]  ?MRN: [id number]  ?Epilepsy Attending: Charlsie Quest  ?Referring Physician/Provider: Simonne Martinet, NP ?Duration: 02/26/2022 0346 to 5/9/ 2023 0346 ?  ?Patient history: 51yo m with h/o seizure now with ams. EEG to evaluate for seizure ?  ?Level of alertness: lethargic  ?  ?AEDs during EEG study: VPA, LEV, LCM ?  ?Technical aspects: This EEG study was done with scalp electrodes positioned according to the 10-20 International system of electrode placement. Electrical activity was acquired at a sampling rate of 500Hz  and reviewed with a high frequency filter of 70Hz  and a low frequency filter of 1Hz . EEG data were recorded continuously and digitally stored.  ?  ?Description: EEG showed continuous generalized 3 to 6 Hz theta-delta slowing. Lateralized periodic discharges with overriding fast activity ( LPD+F) was noted in left posterior quadrant at 1hz . Hyperventilation and photic stimulation were not performed.    ?  ?ABNORMALITY  ?- Lateralized periodic discharges with overriding fast activity ( LPD +) left posterior quadrant ?- Continuous slow, generalized ?  ?IMPRESSION: ?This study  showed evidence of epileptogenicity arising from left posterior quadrant with increased risk for seizure recurrence. Additionally there is moderate to severe diffuse encephalopathy, nonspecific etiology. No seizures were seen throughout the recording. ?  ?  ?

## 2022-02-26 NOTE — Progress Notes (Signed)
eLink Physician-Brief Progress Note ?Patient Name: [name] ?DOB: Sep 18, 1971 ?MRN: [id number] ? ? ?Date of Service ? 02/26/2022  ?HPI/Events of Note ? Camera eval done for soft MAP. ? ?Discussed with bed side RN. ?  ?eICU Interventions ? LR 500 ml bolus.   ? ? ? ?Intervention Category ?Intermediate Interventions: Hypotension - evaluation and management ? ?Ranee Gosselin ?02/26/2022, 6:30 AM ?

## 2022-02-26 NOTE — Progress Notes (Signed)
LTM maint complete - no skin breakdown under:  Fp2 F8 F4 ?[facility] monitored, Event button test confirmed by [facility]. ? ? ?

## 2022-02-26 NOTE — Progress Notes (Signed)
[facility] ADULT ICU REPLACEMENT PROTOCOL ? ? ?The patient does apply for the [facility] Adult ICU Electrolyte Replacment Protocol based on the criteria listed below:  ? ?1.Exclusion criteria: TCTS patients, ECMO patients, and Dialysis patients ?2. Is GFR >/= 30 ml/min? Yes.    ?Patient's GFR today is >60 ?3. Is SCr </= 2? Yes.   ?Patient's SCr is 0.48 mg/dL ?4. Did SCr increase >/= 0.5 in 24 hours? No. ?5.Pt's weight >40kg  Yes.   ?6. Abnormal electrolyte(s): K  ?7. Electrolytes replaced per protocol ?8.  Call MD STAT for K+ </= 2.5, Phos </= 1, or Mag </= 1 ?Physician:  Prudencio Burly ? ?[name] 02/26/2022 5:17 AM  ?

## 2022-02-27 LAB — BASIC METABOLIC PANEL
Anion gap: 3 — ABNORMAL LOW (ref 5–15)
BUN: 18 mg/dL (ref 6–20)
CO2: 24 mmol/L (ref 22–32)
Calcium: 7.5 mg/dL — ABNORMAL LOW (ref 8.9–10.3)
Chloride: 113 mmol/L — ABNORMAL HIGH (ref 98–111)
Creatinine, Ser: 0.49 mg/dL — ABNORMAL LOW (ref 0.61–1.24)
GFR, Estimated: >60 mL/min (ref >60)
Glucose, Bld: 184 mg/dL — ABNORMAL HIGH (ref 70–99)
Potassium: 3.5 mmol/L (ref 3.5–5.1)
Sodium: 140 mmol/L (ref 135–145)

## 2022-02-27 LAB — CBC
HCT: 24.3 % — ABNORMAL LOW (ref 39.0–52.0)
Hemoglobin: 7.9 g/dL — ABNORMAL LOW (ref 13.0–17.0)
MCH: 30.4 pg (ref 26.0–34.0)
MCHC: 32.5 g/dL (ref 30.0–36.0)
MCV: 93.5 fL (ref 80.0–100.0)
Platelets: UNDETERMINED 10*3/uL (ref 150–400)
RBC: 2.6 MIL/uL — ABNORMAL LOW (ref 4.22–5.81)
RDW: 19.2 % — ABNORMAL HIGH (ref 11.5–15.5)
WBC: 14.2 10*3/uL — ABNORMAL HIGH (ref 4.0–10.5)
nRBC: 3.7 % — ABNORMAL HIGH (ref 0.0–0.2)

## 2022-02-27 LAB — CULTURE, BLOOD (ROUTINE X 2)
Culture: NO GROWTH
Culture: NO GROWTH
Special Requests: ADEQUATE
Special Requests: ADEQUATE

## 2022-02-27 LAB — GLUCOSE, CAPILLARY
Glucose-Capillary: 100 mg/dL — ABNORMAL HIGH (ref 70–99)
Glucose-Capillary: 105 mg/dL — ABNORMAL HIGH (ref 70–99)
Glucose-Capillary: 107 mg/dL — ABNORMAL HIGH (ref 70–99)
Glucose-Capillary: 85 mg/dL (ref 70–99)
Glucose-Capillary: 88 mg/dL (ref 70–99)
Glucose-Capillary: 89 mg/dL (ref 70–99)
Glucose-Capillary: 96 mg/dL (ref 70–99)

## 2022-02-27 LAB — PHOSPHORUS: Phosphorus: 1.7 mg/dL — ABNORMAL LOW (ref 2.5–4.6)

## 2022-02-27 LAB — MAGNESIUM: Magnesium: 1.7 mg/dL (ref 1.7–2.4)

## 2022-02-27 MED ORDER — GERHARDT'S BUTT CREAM
TOPICAL_CREAM | CUTANEOUS | Status: DC | PRN
  Filled 2022-02-27: qty 1

## 2022-02-27 MED ORDER — POTASSIUM PHOSPHATES 15 MMOLE/5ML IV SOLN
30.0000 mmol | Freq: Once | INTRAVENOUS | Status: AC
  Administered 2022-02-27: 30 mmol via INTRAVENOUS
  Filled 2022-02-27: qty 10

## 2022-02-27 MED ORDER — HYDROMORPHONE HCL 1 MG/ML IJ SOLN
0.5000 mg | Freq: Four times a day (QID) | INTRAMUSCULAR | Status: DC | PRN
  Administered 2022-02-27 – 2022-03-01 (×3): 0.5 mg via INTRAVENOUS
  Filled 2022-02-27: qty 0.5
  Filled 2022-02-27: qty 1
  Filled 2022-02-27: qty 0.5
  Filled 2022-02-27: qty 1

## 2022-02-27 MED ORDER — MAGNESIUM SULFATE 2 GM/50ML IV SOLN
2.0000 g | Freq: Once | INTRAVENOUS | Status: AC
  Administered 2022-02-27: 2 g via INTRAVENOUS
  Filled 2022-02-27: qty 50

## 2022-02-27 NOTE — Progress Notes (Signed)
? ?NAME:  [name], MRN:  [id number], DOB:  06/12/1971, LOS: 11 ?ADMISSION DATE:  01/30/2022, CONSULTATION DATE:  02/11/2022 ?REFERRING MD:  Dr. Deretha Emory, CHIEF COMPLAINT:  Hypotension with concern for sepsis  ? ?History of Present Illness:  ?51 year old man who presented to [facility] ED 4/28 via EMS for hypotension. Of note patient was just discharged from this facility 2 days prior for management of sepsis with bacteremia secondary to osteomyelitis. PMHx significant for spinal cord injury, quadriplegia, osteomyelitis of sacrum, L AKA/R BKA, neurogenic bladder requiring in and out caths, seizures, and prior wound dehiscence  ? ?On ED arrival patient was seen hypotensive with all other vitals WNL. Lab work significant for NA 131, glucose 42, creatinine 0.53, albumin <1.5, Hgb 8.5, lactic normal at 1.5, INR 1.4. Given hypotension with concern for need of pressor support PCCM consulted for further management and admission   ? ?Pertinent Medical History:  ?spinal cord injury, quadriplegia, osteomyelitis of sacrum, neurogenic bladder requiring in and out caths, seizures, and prior wound dehiscence; L AKA, R BKA ? ?Significant Hospital Events: ?Including procedures, antibiotic start and stop dates in addition to other pertinent events   ?4/28 patient presented to ED after home health nurse identified hypotension.  Concern for recurrent sepsis.  Of note patient was just discharged from this facility 2 days prior ?4/29 PICC line placed ?5/1 still on levophed. Passed SLP swallow eval for mech soft diet.  ?5/2 ID recommending 4 weeks CTX and oral flagyl then f/u in ID clinic ?5/3 still on pressors. Goal SBP changed to > 90. Increased midodrine to 10 mg tid.  Refused medications intermittently throughout the day.  Remained on pressors.  Several episodes of hypoglycemia, growing concern for encephalopathy ?5/4 encephalopathic on a.m. rounds, glucose 78, placed on D10 during evening rounds, stress dose steroids ordered.   ?5/5 Remains encephalopathic with intermittent agitation   ?5/6 Renal US given oliguria  ?5/7 Desaturated yesterday, back on 5L today. Renal US without significant findings, EEG with evidence of epileptogenicity but no seizure  ?5/8 Worsening respiratory status with increased secretions, poor airway clearance; requiring increased O2 support (Venturi mask 6L today). Intermittently hypotensive, ongoing periodic epileptogenic discharges on R. Multiple discussions at bedside with family (sister, daughters). DNR/DNI. ?5/9 Improved secretion management today, requiring less O2 (on RA) but less responsive. Shivering noted on exam. Will wake to voice but not reliably following commands. Sister updated at bedside. ? ?Interim History / Subjective:  ?Improved secretion management ?Requiring less O2, on room air ?Less responsive, will wake to voice but unreliably following commands ?Shivering noted this morning ?Made DNR yesterday ?Sister updated at bedside ? ?Objective:   ?Blood pressure 91/74, pulse 86, temperature 98.3 ?F (36.8 ?C), temperature source Axillary, resp. rate 17, height 5\' 3"  (1.6 m), weight 77.5 kg, SpO2 99 %. ?   ?   ? ?Intake/Output Summary (Last 24 hours) at 02/27/2022 0805 ?Last data filed at 02/27/2022 0600 ?Gross per 24 hour  ?Intake 2240.53 ml  ?Output 1020 ml  ?Net 1220.53 ml  ? ? ?Filed Weights  ? 02/25/22 0458 02/26/22 0500 02/27/22 0500  ?Weight: 74.2 kg 78 kg 77.5 kg  ? ?Physical Examination: ?General: Chronically ill-appearing middle-aged man in NAD. Shivering. ?HEENT: [place]/AT, anicteric sclera, PERRL 79mm, slightly dry mucous membranes. ?Neuro: Lethargic. Responds to verbal stimuli. Following commands intermittently. No spontaneous movement of extremities witnessed on exam. +Corneal, +Cough, and +Gag  ?CV: RRR, no m/g/r. ?PULM: Breathing even and unlabored on RA. Lung fields with coarse rhonchi throughout,  more prominent on R, improved slightly from prior exam. ?GI: Soft, nontender, nondistended.  Normoactive bowel sounds. ?Extremities: Bilateral symmetric 1+ LE edema noted proximally, s/p L AKA/R BKA. ?GU: Significant scrotal edema noted. ?Skin: Cool/dry, no rashes. ? ?Resolved Hospital Problem List:   ?Lactic Acidosis ? ?Assessment & Plan:  ? ?Sepsis due to sacral osteomyelitis ?Sacral decubitus ulcers, POA ?Previous Staph epi bacteremia 4/23 ?- Ceftriaxone/Flagyl x 4-week course (~5/30), consider discontinuation pending ongoing patient/family GOC discussions ?- F/u finalized Cx data ?- WOC for chronic sacral decubitus wounds ?- Imodium as needed for diarrhea ? ?Hypotension, improving  ?Chronic component + hypovolemia + improved shock. Hypotensive with MAPs 50s 5/7PM, fluid-responsive. ?- MAPs improved 5/8PM-5/9AM ?- Goal MAP > 65 ?- Fluid resuscitation as able ?- Continue midodrine ?- Taper SDS ? 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 Baseline per family he completes ADLs himself, schedules his appointments/rides -- fairly independent. ?- Neuro following, appreciate recommendations ?- Continue LTM EEG; ongoing periodic epileptiform discharges, no seizures noted ?- AEDs per Neuro (Keppra, Vimpat, valproate, Fycompa x 1) ?- Goal frequent neuro exams ?- Neuroprotective measures: HOB > 30 degrees, normoglycemia, normothermia, electrolytes WNL ? ?AKI with oliguria ?Hx neurogenic bladder ?Creatinine improved, but still minimal UOP, +13L since admission Renal US without acute findings. ?- Trend BMP ?- Replete electrolytes as indicated ?- Monitor I&Os ?- F/u urine studies ?- Avoid nephrotoxic agents as able ?- Ensure adequate renal perfusion ?- Continue Foley for now, consider d/c with I&O caths as appropriate ? ?Hx cervical cord injury with resultant paraplegia  ?Hx R BKA, L AKA ?- Supportive care ?- PT/OT when able to participate in care ? ?Severe protein calorie malnutrition ?Albumin less than 1.5 on admission ?- TF via Cortrak ?-  Appreciate Nutrition/RD assistance ? ?Chronic anemia, anemia of critical disease  ?Hgb 6.9 today and transfused 1 unit PRBC's ?- Trend CBC ?- Transfuse for Hgb < 7.0 or hemodynamically significant bleeding ? ?GOC ?- Several GOC discussions 5/8 with patient's sister and daughters ?- Explained that patient is slightly objectively clinically improved today from a respiratory standpoint, overall remains very sick and we may just be prolonging inevitable worsening once antibiotic course is completed, etc. ?- Remains DNR/DNI at this time ? ?Best Practice: (right click and "Reselect all SmartList Selections" daily)  ? ?Diet/type: tubefeeds ?DVT prophylaxis: prophylactic heparin  ?GI prophylaxis: PPI ?Lines: Central line PICC ?Foley:  N/A and Yes, and it is still needed ?Code Status:  DNR ?Last date of multidisciplinary goals of care discussion: Multiple GOC discussions with family completed 5/8, patient's sister/daughters at bedside.  Sister updated at bedside 5/9.  Remains DNR/DNI. ? ?Critical care time: 38 minutes  ? ?Tim Lair, PA-C ?[facility] ?02/27/22 8:05 AM ? ?Please see [email or web address] for pager details. ? ?From 7A-7P if no response, please call [phone] ?After hours, please call ELink [phone] ?

## 2022-02-27 NOTE — Progress Notes (Signed)
LTM EEG discontinued - no skin breakdown at unhook.   

## 2022-02-27 NOTE — Procedures (Addendum)
Patient Name: [name]  ?MRN: [id number]  ?Epilepsy Attending: Lora Havens  ?Referring Physician/Provider: Erick Colace, NP ?Duration: 02/27/2022 0346 to 5/9/ 2023 1039 ?  ?Patient history: 51yo m with h/o seizure now with ams. EEG to evaluate for seizure ?  ?Level of alertness: lethargic  ?  ?AEDs during EEG study: VPA, LEV, LCM ?  ?Technical aspects: This EEG study was done with scalp electrodes positioned according to the 10-20 International system of electrode placement. Electrical activity was acquired at a sampling rate of 500Hz  and reviewed with a high frequency filter of 70Hz  and a low frequency filter of 1Hz . EEG data were recorded continuously and digitally stored.  ?  ?Description: EEG showed continuous generalized 3 to 6 Hz theta-delta slowing. Lateralized periodic discharges with overriding fast activity ( LPD+F) was noted in left posterior quadrant at 1hz . Hyperventilation and photic stimulation were not performed.    ?  ?ABNORMALITY  ?- Lateralized periodic discharges with overriding fast activity ( LPD +) left posterior quadrant ?- Continuous slow, generalized ?  ?IMPRESSION: ?This study  showed evidence of epileptogenicity arising from left posterior quadrant with increased risk for seizure recurrence. Additionally there is moderate to severe diffuse encephalopathy, nonspecific etiology. No seizures were seen throughout the recording. ?  ?Lora Havens  ?

## 2022-02-27 NOTE — Progress Notes (Signed)
Physical Therapy Discharge ?Patient Details ?Name: [name] ?MRN: [id number] ?DOB: December 11, 1970 ?Today's Date: 02/27/2022 ?Time:  - 1400 ?  ? ?Patient discharged from PT services secondary to medical decline - will need to re-order PT to resume therapy services. ? ?Please see latest therapy progress note for current level of functioning and progress toward goals.   ? ?Progress and discharge plan discussed with patient and/or caregiver: Patient unable to participate in discharge planning and no caregivers available ? ?GP ?  Lyanne Co, PT  ?Acute Rehab Services ? Pager [phone] ?Office [phone] ? ? ?Betsey Sossamon L Breckin Zafar ?02/27/2022, 2:58 PM  ?

## 2022-02-27 NOTE — Progress Notes (Signed)
[facility] ADULT ICU REPLACEMENT PROTOCOL ? ? ?The patient does apply for the [facility] Adult ICU Electrolyte Replacment Protocol based on the criteria listed below:  ? ?1.Exclusion criteria: TCTS patients, ECMO patients, and Dialysis patients ?2. Is GFR >/= 30 ml/min? Yes.    ?Patient's GFR today is >60 ?3. Is SCr </= 2? Yes.   ?Patient's SCr is 0.49 mg/dL ?4. Did SCr increase >/= 0.5 in 24 hours? No. ?5.Pt's weight >40kg  Yes.   ?6. Abnormal electrolyte(s):   K 3.5, Phos 1.7, Mg 1.7  ?7. Electrolytes replaced per protocol ?8.  Call MD STAT for K+ </= 2.5, Phos </= 1, or Mag </= 1 ?Physician:  Lona Kettle ? ?Wilburt Finlay 02/27/2022 6:09 AM ? ?

## 2022-02-27 NOTE — Progress Notes (Signed)
EEG maintenance performed.  No skin breakdown observed at Fp1, Fp2. 
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
## 2022-02-27 NOTE — Progress Notes (Signed)
Nutrition Follow-up ? ?DOCUMENTATION CODES:  ? ?Non-severe (moderate) malnutrition in context of chronic illness ? ?INTERVENTION:  ? ?Tube feeding via Cortrak: ?Vital 1.5 @ 55 ml/hr (1320 ml per day) ?Prosource TF 45 ml BID ? ?Provides 2060 kcal, 111 gm protein, 1003 ml free water daily ? ? ? ?NUTRITION DIAGNOSIS:  ? ?Moderate Malnutrition related to chronic illness as evidenced by moderate fat depletion, moderate muscle depletion, severe muscle depletion. ?Ongoing.  ? ?GOAL:  ? ?Patient will meet greater than or equal to 90% of their needs ?Met with TF at goal  ? ?MONITOR:  ? ?PO intake, Supplement acceptance, Diet advancement, Labs, Weight trends, Skin ? ?REASON FOR ASSESSMENT:  ? ?Consult ?Enteral/tube feeding initiation and management ? ?ASSESSMENT:  ? ?51 yo male admitted with hypotension, recurrent sepsis d/t left ischial osteomyelitis and infected decubitus ulcer. PMH includes staph epidermidis bacteremia, SCI, quadriplegia, sacral osteomyelitis, neurogenic bladder, seizures, tobacco use, L AKA, R BKA. ? ?Pt discussed during ICU rounds and with RN. MD discussed poor prognosis during rounds, working with family on goals of care. Pt remains on room air with cortrak in place on TF. Pt remains encephalopathic.  ? ?Admission weight: 65.4 kg ?Current weight: 77.5 kg ?Pt with 4+ edema to remaining BLE and moderate edema to RUE per RN  ? ?04/28 - admitted for sepsis ?05/05 - tx to [facility] for cEEG ; cortrak placed tip gastric antrum per xray  ? ? ?Medications reviewed and include: MVI with minerals, juven, protonix ?Mag sulfate x 1 ?Kphos x 1 ? ?Labs reviewed: PO4: 1.7 ?CBG's: 88-105 ? ? ? ?Diet Order:   ?Diet Order   ? ?       ?  Diet NPO time specified  Diet effective now       ?  ? ?  ?  ? ?  ? ? ?EDUCATION NEEDS:  ? ?Not appropriate for education at this time ? ?Skin:  Skin Integrity Issues:: Stage II, DTI, Stage IV ?DTI: R hip ?Stage II: R buttocks ?Stage IV: L ischial tuberosity, L hip x 2 ? ?Last BM:   5/8 ? ?Height:  ? ?Ht Readings from Last 1 Encounters:  ?02/23/22 _0  (1.6 m)  ? ? ?Weight:  ? ?Wt Readings from Last 1 Encounters:  ?02/27/22 77.5 kg  ? ? ?BMI:  Body mass index is 30.27 kg/m?. ? ?Estimated Nutritional Needs:  ? ?Kcal:  2000-2200 ? ?Protein:  100-120 gm ? ?Fluid:  2.2-2.4 L ? ?Lockie Pares., RD, LDN, CNSC ?See AMiON for contact information  ? ?

## 2022-02-28 LAB — GLUCOSE, CAPILLARY
Glucose-Capillary: 100 mg/dL — ABNORMAL HIGH (ref 70–99)
Glucose-Capillary: 102 mg/dL — ABNORMAL HIGH (ref 70–99)
Glucose-Capillary: 85 mg/dL (ref 70–99)
Glucose-Capillary: 85 mg/dL (ref 70–99)
Glucose-Capillary: 95 mg/dL (ref 70–99)

## 2022-02-28 LAB — CBC
HCT: 24 % — ABNORMAL LOW (ref 39.0–52.0)
Hemoglobin: 7.7 g/dL — ABNORMAL LOW (ref 13.0–17.0)
MCH: 30.6 pg (ref 26.0–34.0)
MCHC: 32.1 g/dL (ref 30.0–36.0)
MCV: 95.2 fL (ref 80.0–100.0)
Platelets: 110 10*3/uL — ABNORMAL LOW (ref 150–400)
RBC: 2.52 MIL/uL — ABNORMAL LOW (ref 4.22–5.81)
RDW: 20.1 % — ABNORMAL HIGH (ref 11.5–15.5)
WBC: 10.4 10*3/uL (ref 4.0–10.5)
nRBC: 2.1 % — ABNORMAL HIGH (ref 0.0–0.2)

## 2022-02-28 LAB — BASIC METABOLIC PANEL
Anion gap: 4 — ABNORMAL LOW (ref 5–15)
BUN: 14 mg/dL (ref 6–20)
CO2: 25 mmol/L (ref 22–32)
Calcium: 7.2 mg/dL — ABNORMAL LOW (ref 8.9–10.3)
Chloride: 115 mmol/L — ABNORMAL HIGH (ref 98–111)
Creatinine, Ser: 0.51 mg/dL — ABNORMAL LOW (ref 0.61–1.24)
GFR, Estimated: >60 mL/min (ref >60)
Glucose, Bld: 73 mg/dL (ref 70–99)
Potassium: 3.5 mmol/L (ref 3.5–5.1)
Sodium: 144 mmol/L (ref 135–145)

## 2022-02-28 LAB — PHOSPHORUS: Phosphorus: 3.3 mg/dL (ref 2.5–4.6)

## 2022-02-28 LAB — MAGNESIUM: Magnesium: 1.5 mg/dL — ABNORMAL LOW (ref 1.7–2.4)

## 2022-02-28 MED ORDER — MAGNESIUM SULFATE 4 GM/100ML IV SOLN
4.0000 g | Freq: Once | INTRAVENOUS | Status: AC
  Administered 2022-02-28: 4 g via INTRAVENOUS
  Filled 2022-02-28: qty 100

## 2022-02-28 MED ORDER — POTASSIUM PHOSPHATES 15 MMOLE/5ML IV SOLN
30.0000 mmol | Freq: Once | INTRAVENOUS | Status: DC
  Filled 2022-02-28: qty 10

## 2022-02-28 MED ORDER — POTASSIUM CHLORIDE 20 MEQ PO PACK
40.0000 meq | PACK | ORAL | Status: AC
  Administered 2022-02-28 (×2): 40 meq
  Filled 2022-02-28 (×2): qty 2

## 2022-02-28 MED ORDER — FUROSEMIDE 10 MG/ML IJ SOLN
40.0000 mg | Freq: Once | INTRAMUSCULAR | Status: AC
  Administered 2022-02-28: 40 mg via INTRAVENOUS
  Filled 2022-02-28: qty 4
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
## 2022-02-28 NOTE — Progress Notes (Signed)
? ?NAME:  [name], MRN:  [id number], DOB:  05-Feb-1971, LOS: 12 ?ADMISSION DATE:  Mar 07, 2022, CONSULTATION DATE:  03-07-2022 ?REFERRING MD:  Dr. Deretha Emory, CHIEF COMPLAINT:  Hypotension with concern for sepsis  ? ?History of Present Illness:  ?51 year old man who presented to [facility] ED 4/28 via EMS for hypotension. Of note patient was just discharged from this facility 2 days prior for management of sepsis with bacteremia secondary to osteomyelitis. PMHx significant for spinal cord injury, quadriplegia, osteomyelitis of sacrum, L AKA/R BKA, neurogenic bladder requiring in and out caths, seizures, and prior wound dehiscence  ? ?On ED arrival patient was seen hypotensive with all other vitals WNL. Lab work significant for NA 131, glucose 42, creatinine 0.53, albumin <1.5, Hgb 8.5, lactic normal at 1.5, INR 1.4. Given hypotension with concern for need of pressor support PCCM consulted for further management and admission   ? ?Pertinent Medical History:  ?spinal cord injury, quadriplegia, osteomyelitis of sacrum, neurogenic bladder requiring in and out caths, seizures, and prior wound dehiscence; L AKA, R BKA ? ?Significant Hospital Events: ?Including procedures, antibiotic start and stop dates in addition to other pertinent events   ?4/28 patient presented to ED after home health nurse identified hypotension.  Concern for recurrent sepsis.  Of note patient was just discharged from this facility 2 days prior ?4/29 PICC line placed ?5/1 still on levophed. Passed SLP swallow eval for mech soft diet.  ?5/2 ID recommending 4 weeks CTX and oral flagyl then f/u in ID clinic ?5/3 still on pressors. Goal SBP changed to > 90. Increased midodrine to 10 mg tid.  Refused medications intermittently throughout the day.  Remained on pressors.  Several episodes of hypoglycemia, growing concern for encephalopathy ?5/4 encephalopathic on a.m. rounds, glucose 78, placed on D10 during evening rounds, stress dose steroids ordered.   ?5/5 Remains encephalopathic with intermittent agitation   ?5/6 Renal US given oliguria  ?5/7 Desaturated yesterday, back on 5L today. Renal US without significant findings, EEG with evidence of epileptogenicity but no seizure  ?5/8 Worsening respiratory status with increased secretions, poor airway clearance; requiring increased O2 support (Venturi mask 6L today). Intermittently hypotensive, ongoing periodic epileptogenic discharges on R. Multiple discussions at bedside with family (sister, daughters). DNR/DNI. ?5/9 Improved secretion management today, requiring less O2 (on RA) but less responsive. Shivering noted on exam. Will wake to voice but not reliably following commands. Sister updated at bedside. ?5/10 No overnight events, more responsive today, answering yes/no to questions ? ?Interim History / Subjective:  ? ?Improved mentation today, will say he's doing fine, but answers the same way to every question. Moves LUE to command and remains on RA with decreased secretions ?No overnight events  ? ? ?Objective:   ?Blood pressure (!) 88/51, pulse 92, temperature 99.8 ?F (37.7 ?C), temperature source Axillary, resp. rate (!) 21, height 5\' 3"  (1.6 m), weight 74.5 kg, SpO2 100 %. ?   ?   ? ?Intake/Output Summary (Last 24 hours) at 02/28/2022 0757 ?Last data filed at 02/28/2022 0600 ?Gross per 24 hour  ?Intake 2406.75 ml  ?Output 2580 ml  ?Net -173.25 ml  ? ? ?Filed Weights  ? 02/26/22 0500 02/27/22 0500 02/28/22 0500  ?Weight: 78 kg 77.5 kg 74.5 kg  ? ? ? ? ? ?General:  chronically ill-appearing M, sitting up in bed awake and in no acute distress ?HEENT: MM pink/moist, NGT in place, sclera anicteric, pupils equal ?Neuro: awake, speech clear, answers "doing fine" to every question, moving the LUE to  command ?CV: s1s2 rrr, no m/r/g ?PULM:  rhonchi bilateral bases, decreasing, no distress on RA, no wheezing ?GI: soft, soft, non-distended ?Extremities: warm/dry, bilat BKA, 1+ edema  ?Skin: no rashes or  lesions ? ? ? ? ?Labs reviewed: ?WBC 10.4 ?Hgb 7.7 ? ?Resolved Hospital Problem List:   ?Lactic Acidosis ? ?Assessment & Plan:  ? ?Sepsis due to sacral osteomyelitis ?Sacral decubitus ulcers, POA ?Previous Staph epi bacteremia 4/23 ?Sepsis improvinh  ?- Ceftriaxone/Flagyl x 4-week course (~5/30), Family would consider comfort care if patient worsens ?- cultures without growth, final  ?- WOC for chronic sacral decubitus wounds ?- Imodium as needed for diarrhea ? ?Hypotension, improving  ?Initially Chronic component + hypovolemia + shock, was hypotensive with MAPs 50s 5/7PM, fluid-responsive. ?Now stable ?-continue midodrine ?- MAPs improved 5/8PM-5/9AM ?- Goal MAP > 65 ?- Fluid resuscitation as able ?- Taper SDS ? 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 Baseline per family he completes ADLs himself, schedules his appointments/rides -- fairly independent. ?-GOC with family 5/8, pt now DNR, if worsens would consider comfort care  ?- Neuro following, appreciate recommendations ?-LTM discontinued as no definite seizure noted despite epileptogenicity ?- AEDs per Neuro (Keppra, Vimpat, valproate, Fycompa x 1) ?-seizure precautions ?- Goal frequent neuro exams ?- Neuroprotective measures: HOB > 30 degrees, normoglycemia, normothermia, electrolytes WNL ? ?AKI  ?Hx neurogenic bladder ?Improving creatinine and UOP, 2.5L out yesterday ?Renal US without acute findings. ?-consider Lasix +17L since admission ?- Trend BMP ?- Replete electrolytes as indicated ?- Monitor I&Os ?- F/u urine studies ?- Avoid nephrotoxic agents as able ?- Ensure adequate renal perfusion ?- Continue Foley for now, consider d/c with I&O caths as appropriate ? ?Hx cervical cord injury with resultant paraplegia  ?Hx R BKA, L AKA ?- Supportive care ?- PT/OT when able to participate in care ? ?Severe protein calorie malnutrition ?Albumin less than 1.5 on admission ?- TF via Cortrak ?-  Appreciate Nutrition/RD assistance ? ?Chronic anemia, anemia of critical disease  ?Hgb 6.9 today and transfused 1 unit PRBC's ?- Trend CBC ?- Transfuse for Hgb < 7.0 or hemodynamically significant bleeding ? ?GOC ?-improving over the last several days, stable for transfer out of ICU ?- Several GOC discussions 5/8 with patient's sister and daughters, DNR, if worsens would consider comfort focused care ?- Remains DNR/DNI at this time ? ?Best Practice: (right click and "Reselect all SmartList Selections" daily)  ? ?Diet/type: tubefeeds ?DVT prophylaxis: prophylactic heparin  ?GI prophylaxis: PPI ?Lines: Central line PICC ?Foley:  Yes, and it is still needed ?Code Status:  DNR ?Last date of multidisciplinary goals of care discussion: Multiple GOC discussions with family completed 5/8, patient's sister/daughters at bedside.  Sister updated at bedside 5/9.  Remains DNR/DNI.   Update pending 5/10 ? ?Critical care time:   ? ?Darcella Gasman Malik Paar, PA-C ?[facility] ?02/28/22 7:57 AM ? ?Please see [email or web address] for pager details. ? ?From 7A-7P if no response, please call [phone] ?After hours, please call ELink [phone] ?

## 2022-02-28 NOTE — Plan of Care (Signed)
Pt's family updated with plan of care at the bedside.  ? ? ?Darcella Gasman Cara Aguino, PA-C ? ?

## 2022-02-28 NOTE — Progress Notes (Addendum)
Pt transferred to 5W19. ?

## 2022-03-01 ENCOUNTER — Inpatient Hospital Stay (HOSPITAL_COMMUNITY): Payer: Medicare Other

## 2022-03-01 DIAGNOSIS — E878 Other disorders of electrolyte and fluid balance, not elsewhere classified: Secondary | ICD-10-CM | POA: Diagnosis not present

## 2022-03-01 DIAGNOSIS — R4182 Altered mental status, unspecified: Secondary | ICD-10-CM | POA: Diagnosis not present

## 2022-03-01 DIAGNOSIS — Z515 Encounter for palliative care: Secondary | ICD-10-CM

## 2022-03-01 DIAGNOSIS — J9601 Acute respiratory failure with hypoxia: Secondary | ICD-10-CM | POA: Diagnosis not present

## 2022-03-01 DIAGNOSIS — Z993 Dependence on wheelchair: Secondary | ICD-10-CM

## 2022-03-01 DIAGNOSIS — A419 Sepsis, unspecified organism: Secondary | ICD-10-CM | POA: Diagnosis not present

## 2022-03-01 DIAGNOSIS — Z89511 Acquired absence of right leg below knee: Secondary | ICD-10-CM

## 2022-03-01 DIAGNOSIS — E43 Unspecified severe protein-calorie malnutrition: Secondary | ICD-10-CM

## 2022-03-01 DIAGNOSIS — L89209 Pressure ulcer of unspecified hip, unspecified stage: Secondary | ICD-10-CM

## 2022-03-01 LAB — CBC
HCT: 24.3 % — ABNORMAL LOW (ref 39.0–52.0)
Hemoglobin: 7.7 g/dL — ABNORMAL LOW (ref 13.0–17.0)
MCH: 30.7 pg (ref 26.0–34.0)
MCHC: 31.7 g/dL (ref 30.0–36.0)
MCV: 96.8 fL (ref 80.0–100.0)
Platelets: 115 10*3/uL — ABNORMAL LOW (ref 150–400)
RBC: 2.51 MIL/uL — ABNORMAL LOW (ref 4.22–5.81)
RDW: 21.1 % — ABNORMAL HIGH (ref 11.5–15.5)
WBC: 12 10*3/uL — ABNORMAL HIGH (ref 4.0–10.5)
nRBC: 0.6 % — ABNORMAL HIGH (ref 0.0–0.2)

## 2022-03-01 LAB — GLUCOSE, CAPILLARY
Glucose-Capillary: 124 mg/dL — ABNORMAL HIGH (ref 70–99)
Glucose-Capillary: 53 mg/dL — ABNORMAL LOW (ref 70–99)
Glucose-Capillary: 66 mg/dL — ABNORMAL LOW (ref 70–99)
Glucose-Capillary: 67 mg/dL — ABNORMAL LOW (ref 70–99)
Glucose-Capillary: 71 mg/dL (ref 70–99)
Glucose-Capillary: 74 mg/dL (ref 70–99)
Glucose-Capillary: 78 mg/dL (ref 70–99)
Glucose-Capillary: 83 mg/dL (ref 70–99)

## 2022-03-01 LAB — BASIC METABOLIC PANEL
Anion gap: 4 — ABNORMAL LOW (ref 5–15)
BUN: 16 mg/dL (ref 6–20)
CO2: 27 mmol/L (ref 22–32)
Calcium: 7.8 mg/dL — ABNORMAL LOW (ref 8.9–10.3)
Chloride: 114 mmol/L — ABNORMAL HIGH (ref 98–111)
Creatinine, Ser: 0.55 mg/dL — ABNORMAL LOW (ref 0.61–1.24)
GFR, Estimated: >60 mL/min (ref >60)
Glucose, Bld: 89 mg/dL (ref 70–99)
Potassium: 4.3 mmol/L (ref 3.5–5.1)
Sodium: 145 mmol/L (ref 135–145)

## 2022-03-01 LAB — TYPE AND SCREEN
ABO/RH(D): O NEG
Antibody Screen: NEGATIVE

## 2022-03-01 LAB — MAGNESIUM: Magnesium: 2 mg/dL (ref 1.7–2.4)

## 2022-03-01 LAB — BRAIN NATRIURETIC PEPTIDE: B Natriuretic Peptide: 335.7 pg/mL — ABNORMAL HIGH (ref 0.0–100.0)

## 2022-03-01 MED ORDER — VITAL 1.5 CAL PO LIQD
1000.0000 mL | ORAL | Status: DC
  Administered 2022-03-01: 1000 mL
  Filled 2022-03-01 (×2): qty 1000

## 2022-03-01 MED ORDER — DEXTROSE 50 % IV SOLN
12.5000 g | Freq: Once | INTRAVENOUS | Status: AC
  Administered 2022-03-01: 12.5 g via INTRAVENOUS
  Filled 2022-03-01: qty 50

## 2022-03-01 MED ORDER — KCL IN DEXTROSE-NACL 10-5-0.45 MEQ/L-%-% IV SOLN
INTRAVENOUS | Status: DC
  Filled 2022-03-01 (×2): qty 1000

## 2022-03-01 MED ORDER — DEXTROSE 50 % IV SOLN
25.0000 mL | Freq: Once | INTRAVENOUS | Status: AC
  Administered 2022-03-02: 25 mL via INTRAVENOUS
  Filled 2022-03-01: qty 50

## 2022-03-01 MED ORDER — JUVEN PO PACK
1.0000 | PACK | Freq: Two times a day (BID) | ORAL | Status: DC
  Administered 2022-03-02 (×2): 1
  Filled 2022-03-01 (×2): qty 1

## 2022-03-01 NOTE — Progress Notes (Signed)
?                     PROGRESS NOTE                                             ?                                                                                                                     ?                                         ? ? Patient Demographics:  ? ? [name], is a 51 y.o. male, DOB - 03-Apr-1971, ED:[id number] ? ?Outpatient Primary MD for the patient is System, Provider Not In    LOS - 13  Admit date - 02/17/2022   ? ?Chief Complaint  ?Patient presents with  ? Blood Infection  ?    ? ?Brief Narrative (HPI from H&P)   - 51 year old man who presented to [facility] ED 4/28 via EMS for hypotension. Of note patient was just discharged from this facility 2 days prior for management of sepsis with bacteremia secondary to osteomyelitis. PMHx significant for spinal cord injury, baseline paraplegia, osteomyelitis of sacrum, L AKA/R BKA, neurogenic bladder requiring in and out caths, seizures, and prior wound dehiscence, he was brought in the ER with low blood pressure and was diagnosed with sepsis in the ER.  Work-up suggested patient was septic and bacteremic possible source was sacral osteomyelitis and decubitus ulcers, he was in ICU for 12 days and transferred to my service on 03/01/2022, he still has an NG tube, still not able to clear his airway and gurgling with oral secretions, extremely frail and cachectic. ? ? ?4/28 patient presented to ED after home health nurse identified hypotension.  Concern for recurrent sepsis.  Of note patient was just discharged from this facility 2 days prior ?4/29 PICC line placed ?5/1 still on levophed. Passed SLP swallow eval for mech soft diet.  ?5/2 ID recommending 4 weeks CTX and oral flagyl then f/u in ID clinic ?5/3 still on pressors. Goal SBP changed to > 90. Increased midodrine to 10 mg tid.  Refused medications intermittently throughout the day.  Remained on pressors.  Several episodes of hypoglycemia, growing concern for  encephalopathy ?5/4 encephalopathic on a.m. rounds, glucose 78, placed on D10 during evening rounds, stress dose steroids ordered.  ?5/5 Remains encephalopathic with intermittent agitation   ?5/6 Renal US given oliguria  ?5/7 Desaturated yesterday, back on 5L today. Renal US without significant findings, EEG with evidence of epileptogenicity but no seizure  ?5/8 Worsening respiratory status with increased secretions, poor airway clearance; requiring increased O2 support (Venturi mask 6L today). Intermittently hypotensive, ongoing periodic epileptogenic discharges on R.  Multiple discussions at bedside with family (sister, daughters). DNR/DNI. ?5/9 Improved secretion management today, requiring less O2 (on RA) but less responsive. Shivering noted on exam. Will wake to voice but not reliably following commands. Sister updated at bedside. ?5/11 >> transferred out of ICU to hospitalist service on 03/01/2022, sitting up in bed frail and cachectic, answering few questions in a  weak voice, still has NG tube, throat full of oral secretions and gurgling. ? ? Subjective:  ? ? [name] today has, No headache, No chest pain, No abdominal pain - No Nausea, No new weakness tingling or numbness, mild cough & SOB. ? ? Assessment  & Plan :  ? ?Sepsis due to sacral osteomyelitis, Sacral decubitus ulcers, POA - Previous Staph epi bacteremia 02/11/22, Sepsis pathophysiology has slightly improved, he is continuing his Rocephin and Flagyl combination with stop date of 03/20/2022.  Per previous discussions in ICU if there is further decline they will consider comfort measures.  Currently DNR but continue medical treatment.  Wound care following for sacral decubitus wounds, Imodium as needed for diarrhea, continue Foley catheter for skin hygiene, overall extremely poor prognosis.  Palliative care also consulted on 03/01/2022 and family updated. ? ?  ?Hypotension, improving  -  Chronic component + hypovolemia + shock, was hypotensive with  MAPs 50s 5/7PM, has been adequately hydrated, continue hydration gently on 03/01/2022, on high-dose midodrine which will be continued.  Monitor.   ?  ? 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 Baseline per family he completes ADLs himself, schedules his appointments/rides -- fairly independent.  Currently very weak and frail, long-term EEG not show definite seizure noted despite epileptogenicity, seen by neurology currently on combination of  AEDs per Neuro (Keppra, Vimpat, valproate, Fycompa x 1), continue seizure precautions and monitor. ?  ?Acute metabolic encephalopathy- due to above.  Supportive care. ? ?AKI - Hx neurogenic bladder - in the setting of sepsis likely ATN due to prerenal etiology, has Foley catheter placed in ER, gently diurese when blood pressure allows, nonacute renal ultrasound, AKI has resolved.  At baseline he does in and out catheterization himself. ?  ?Hx cervical cord injury with resultant paraplegia , Hx R BKA, L AKA - Supportive care, PT/OT when able to participate in care ?  ?Severe protein calorie malnutrition - continue protein supplementation ?  ? Chronic anemia, anemia of critical disease - Trend CBC, Transfuse for Hgb < 7.0 or hemodynamically significant bleeding ?  ?[place] - Per ICU team team DNR if deteriorates then focus on comfort care.  Overall long-term prognosis appears very poor, still aspirating oral secretions, will involve Perative care. ?  ? ?   ? ?Condition - Extremely Guarded ? ?Family Communication  :   ? ?Daughter [name] [phone] - 03/01/22  ?Mother [name] [phone] -   ?Brother [name] [phone] - 03/01/22 ?Sister [name] [phone] on 03/01/2022 ? ? ?Code Status :  DNR ? ?Consults  :  PCCM, [place] ? ?PUD Prophylaxis :  PPI ? ? Procedures  :    ? ?  ? ?   ? ?Disposition Plan  :   ? ?Status is: Inpatient ? ?DVT Prophylaxis  :   ? ?heparin injection 5,000 Units Start: 02/17/2022  2200 ? ?Lab Results  ?Component Value Date  ? PLT 115 (L) 03/01/2022  ? ? ?Diet :  ?Diet Order   ? ?       ?  Diet NPO time specified  Diet effective now       ?  ? ?  ?  ? ?  ?  ? ?Inpatient Medications ? ?Scheduled Meds: ? chlorhexidine  15 mL Mouth Rinse BID  ? Chlorhexidine Gluconate Cloth  6 each Topical Daily  ? feeding supplement (PROSource TF)  45 mL Per Tube BID  ? heparin  5,000 Units Subcutaneous Q8H  ? mouth rinse  15 mL Mouth Rinse q12n4p  ? midodrine  20 mg Per Tube TID WC  ? multivitamin with minerals  1 tablet Per Tube Daily  ? [START ON 03/02/2022] nutrition supplement (JUVEN)  1 packet Per Tube BID BM  ? pantoprazole sodium  40 mg Per Tube Daily  ? sodium chloride flush  10-40 mL Intracatheter Q12H  ? sodium hypochlorite   Irrigation BID  ? ?Continuous Infusions: ? sodium chloride 10 mL/hr at 02/22/22 0700  ? cefTRIAXone (ROCEPHIN)  IV Stopped (02/28/22 1544)  ? dextrose 5 % and 0.45 % NaCl with KCl 10 mEq/L    ? [START ON 03/02/2022] feeding supplement (VITAL 1.5 CAL)    ? lacosamide (VIMPAT) IV 200 mg (03/01/22 1003)  ? levETIRAcetam 1,000 mg (02/28/22 1727)  ? metronidazole 500 mg (03/01/22 1015)  ? valproate sodium 800 mg (02/28/22 1922)  ? ?PRN Meds:.Gerhardt's butt cream, HYDROmorphone (DILAUDID) injection, loperamide HCl ? ?Time Spent in minutes  30 ? ? ?Lala Lund M.D on 03/01/2022 at 11:26 AM ? ?To page [name] to [email or web address]  ? ?[facility] -  Office  [phone] ? ?See all Orders from today for further details ? ? ? Objective:  ? ?Vitals:  ? 02/28/22 2256 02/28/22 2346 03/01/22 0400 03/01/22 0917  ?BP: 109/68 98/65 (!) 89/67 (!) 83/56  ?Pulse: 91 84 90 66  ?Resp: 18 19 19 19   ?Temp: 98.8 ?F (37.1 ?C) 98.9 ?F (37.2 ?C) 98.8 ?F (37.1 ?C) 98.2 ?F (36.8 ?C)  ?TempSrc: Axillary Axillary Axillary Axillary  ?SpO2: 100% 100% 100%   ?Weight:      ?Height:      ? ? ?Wt Readings from Last 3 Encounters:  ?02/28/22 74.5 kg  ?02/12/22 61.7 kg  ?12/17/21 61.7 kg  ? ? ? ?Intake/Output Summary  (Last 24 hours) at 03/01/2022 1126 ?Last data filed at 03/01/2022 0500 ?Gross per 24 hour  ?Intake 736.51 ml  ?Output 3395 ml  ?Net -2658.49 ml  ? ? ? ?Physical Exam ? ?Awake, NG in place,  ?Cable.AT,PERRAL ?Supple Ne
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
## 2022-03-01 NOTE — Progress Notes (Signed)
Patient had two episodes of decreased heart rate in the 30's.  Desaturation down in the 70's once and placed back on oxygen. Patient now stable.  All wound care was done per physician order. Dilaudid given prior to receiving wound care.  Patient tolerated well.  Has had runny stools.  ?

## 2022-03-01 NOTE — Plan of Care (Signed)
?  Problem: Health Behavior/Discharge Planning: ?Goal: Ability to manage health-related needs will improve ?Outcome: Progressing ?  ?Problem: Elimination: ?Goal: Will not experience complications related to bowel motility ?Outcome: Progressing ?Goal: Will not experience complications related to urinary retention ?Outcome: Progressing ?  ?Problem: Safety: ?Goal: Ability to remain free from injury will improve ?Outcome: Progressing ?  ?Problem: Skin Integrity: ?Goal: Risk for impaired skin integrity will decrease ?Outcome: Progressing ?  ?Problem: Education: ?Goal: Knowledge of General Education information will improve ?Description: Including pain rating scale, medication(s)/side effects and non-pharmacologic comfort measures ?Outcome: Progressing ?  ?Problem: Health Behavior/Discharge Planning: ?Goal: Ability to manage health-related needs will improve ?Outcome: Progressing ?  ?Problem: Clinical Measurements: ?Goal: Ability to maintain clinical measurements within normal limits will improve ?Outcome: Progressing ?Goal: Will remain free from infection ?Outcome: Progressing ?Goal: Diagnostic test results will improve ?Outcome: Progressing ?Goal: Respiratory complications will improve ?Outcome: Progressing ?Goal: Cardiovascular complication will be avoided ?Outcome: Progressing ?  ?Problem: Activity: ?Goal: Risk for activity intolerance will decrease ?Outcome: Progressing ?  ?Problem: Nutrition: ?Goal: Adequate nutrition will be maintained ?Outcome: Progressing ?  ?Problem: Coping: ?Goal: Level of anxiety will decrease ?Outcome: Progressing ?  ?Problem: Elimination: ?Goal: Will not experience complications related to bowel motility ?Outcome: Progressing ?Goal: Will not experience complications related to urinary retention ?Outcome: Progressing ?  ?Problem: Pain Managment: ?Goal: General experience of comfort will improve ?Outcome: Progressing ?  ?Problem: Safety: ?Goal: Ability to remain free from injury will  improve ?Outcome: Progressing ?  ?Problem: Skin Integrity: ?Goal: Risk for impaired skin integrity will decrease ?Outcome: Progressing ?  ?Problem: Safety: ?Goal: Non-violent Restraint(s) ?Outcome: Progressing ?  ?

## 2022-03-01 NOTE — Progress Notes (Signed)
Dr. Candiss Norse made aware of CBG of 36, new order received and carried out. No change in patients mentation, will continue to observe. ?

## 2022-03-01 NOTE — Progress Notes (Signed)
Patient refused to be suctioned per outgoing nurse, patient noted with rhonchi, suction attempt without success. Per Dr. Thedore Mins tube feeding stopped, stat chest xray pending ?
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
## 2022-03-01 NOTE — Progress Notes (Addendum)
RT attempted to oral suction patient due to being able to hear secretions. Patient swatted RT away and yelled for RT to leave him alone. Another RT was present during this time. RN and MD notified at this time.  ?

## 2022-03-01 NOTE — Progress Notes (Addendum)
? ? ? ?  Referral received for [name]  for goals of care discussion. Chart reviewed and updates received from RN. Patient assessed and is unable to engage appropriately in discussions.  ? ?I was able to speak with patient's mother [name] over the phone. GOC meeting scheduled for today at 130pm in patient's room. Family is aware we will meet at patient's bedside.  ? ?Detailed note and recommendations to follow once GOC has been completed.  ? ?Thank you for your referral and allowing PMT to assist in Mr. [name]'s care.  ? ?Dalbert Mayotte. Jamarl Pew, DNP, FNP-BC ?Palliative Medicine Team ?Team Phone # [phone] ? ?NO CHARGE ?

## 2022-03-02 DIAGNOSIS — J9601 Acute respiratory failure with hypoxia: Secondary | ICD-10-CM | POA: Diagnosis not present

## 2022-03-02 LAB — CBC WITH DIFFERENTIAL/PLATELET
Abs Immature Granulocytes: 0.09 10*3/uL — ABNORMAL HIGH (ref 0.00–0.07)
Basophils Absolute: 0 10*3/uL (ref 0.0–0.1)
Basophils Relative: 0 %
Eosinophils Absolute: 0 10*3/uL (ref 0.0–0.5)
Eosinophils Relative: 0 %
HCT: 24.5 % — ABNORMAL LOW (ref 39.0–52.0)
Hemoglobin: 7.6 g/dL — ABNORMAL LOW (ref 13.0–17.0)
Immature Granulocytes: 1 %
Lymphocytes Relative: 15 %
Lymphs Abs: 1.7 10*3/uL (ref 0.7–4.0)
MCH: 30.4 pg (ref 26.0–34.0)
MCHC: 31 g/dL (ref 30.0–36.0)
MCV: 98 fL (ref 80.0–100.0)
Monocytes Absolute: 2.5 10*3/uL — ABNORMAL HIGH (ref 0.1–1.0)
Monocytes Relative: 23 %
Neutro Abs: 6.5 10*3/uL (ref 1.7–7.7)
Neutrophils Relative %: 61 %
Platelets: 126 10*3/uL — ABNORMAL LOW (ref 150–400)
RBC: 2.5 MIL/uL — ABNORMAL LOW (ref 4.22–5.81)
RDW: 20.9 % — ABNORMAL HIGH (ref 11.5–15.5)
WBC: 10.9 10*3/uL — ABNORMAL HIGH (ref 4.0–10.5)
nRBC: 1.2 % — ABNORMAL HIGH (ref 0.0–0.2)

## 2022-03-02 LAB — COMPREHENSIVE METABOLIC PANEL
ALT: 6 U/L (ref 0–44)
AST: 12 U/L — ABNORMAL LOW (ref 15–41)
Albumin: <1.5 g/dL — ABNORMAL LOW (ref 3.5–5.0)
Alkaline Phosphatase: 52 U/L (ref 38–126)
Anion gap: 3 — ABNORMAL LOW (ref 5–15)
BUN: 15 mg/dL (ref 6–20)
CO2: 26 mmol/L (ref 22–32)
Calcium: 7.3 mg/dL — ABNORMAL LOW (ref 8.9–10.3)
Chloride: 111 mmol/L (ref 98–111)
Creatinine, Ser: 0.62 mg/dL (ref 0.61–1.24)
GFR, Estimated: >60 mL/min (ref >60)
Glucose, Bld: 169 mg/dL — ABNORMAL HIGH (ref 70–99)
Potassium: 4.1 mmol/L (ref 3.5–5.1)
Sodium: 140 mmol/L (ref 135–145)
Total Bilirubin: 0.3 mg/dL (ref 0.3–1.2)
Total Protein: 5 g/dL — ABNORMAL LOW (ref 6.5–8.1)

## 2022-03-02 LAB — GLUCOSE, CAPILLARY
Glucose-Capillary: 101 mg/dL — ABNORMAL HIGH (ref 70–99)
Glucose-Capillary: 102 mg/dL — ABNORMAL HIGH (ref 70–99)
Glucose-Capillary: 104 mg/dL — ABNORMAL HIGH (ref 70–99)
Glucose-Capillary: 106 mg/dL — ABNORMAL HIGH (ref 70–99)
Glucose-Capillary: 109 mg/dL — ABNORMAL HIGH (ref 70–99)
Glucose-Capillary: 87 mg/dL (ref 70–99)
Glucose-Capillary: 92 mg/dL (ref 70–99)
Glucose-Capillary: 94 mg/dL (ref 70–99)

## 2022-03-02 LAB — MAGNESIUM: Magnesium: 1.7 mg/dL (ref 1.7–2.4)

## 2022-03-02 LAB — BRAIN NATRIURETIC PEPTIDE: B Natriuretic Peptide: 1261.7 pg/mL — ABNORMAL HIGH (ref 0.0–100.0)

## 2022-03-02 MED ORDER — DEXTROSE 10 % IV SOLN
INTRAVENOUS | Status: DC
Start: 2022-03-02 — End: 2022-03-03
  Administered 2022-03-03: 100 mL/h via INTRAVENOUS
  Filled 2022-03-02 (×2): qty 1000

## 2022-03-02 MED ORDER — VITAL 1.5 CAL PO LIQD
1000.0000 mL | ORAL | Status: DC
  Filled 2022-03-02: qty 1000

## 2022-03-02 MED ORDER — FUROSEMIDE 10 MG/ML IJ SOLN
20.0000 mg | Freq: Once | INTRAMUSCULAR | Status: AC
  Administered 2022-03-02: 20 mg via INTRAVENOUS
  Filled 2022-03-02: qty 2

## 2022-03-02 MED ORDER — FUROSEMIDE 10 MG/ML IJ SOLN
20.0000 mg | Freq: Once | INTRAMUSCULAR | Status: AC
Start: 2022-03-02 — End: 2022-03-02
  Administered 2022-03-02: 20 mg via INTRAVENOUS
  Filled 2022-03-02: qty 2

## 2022-03-02 NOTE — Care Management Important Message (Signed)
Important Message ? ?Patient Details  ?Name: [name] ?MRN: [id number] ?Date of Birth: [birth year] ? ? ?Medicare Important Message Given:  Yes ? ? ? ? ?[name] ?03/02/2022, 3:28 PM ?
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
## 2022-03-02 NOTE — Progress Notes (Signed)
?                     PROGRESS NOTE                                             ?                                                                                                                     ?                                         ? ? Patient Demographics:  ? ? [name], is a 51 y.o. male, DOB - [birth year], [id number] ? ?Outpatient Primary MD for the patient is System, Provider Not In    LOS - 14  Admit date - 02/04/2022   ? ?Chief Complaint  ?Patient presents with  ? Blood Infection  ?    ? ?Brief Narrative (HPI from H&P)   - 51 year old man who presented to [facility] ED 4/28 via EMS for hypotension. Of note patient was just discharged from this facility 2 days prior for management of sepsis with bacteremia secondary to osteomyelitis. PMHx significant for spinal cord injury, baseline paraplegia, osteomyelitis of sacrum, L AKA/R BKA, neurogenic bladder requiring in and out caths, seizures, and prior wound dehiscence, he was brought in the ER with low blood pressure and was diagnosed with sepsis in the ER.  Work-up suggested patient was septic and bacteremic possible source was sacral osteomyelitis and decubitus ulcers, he was in ICU for 12 days and transferred to my service on 03/01/2022, he still has an NG tube, still not able to clear his airway and gurgling with oral secretions, extremely frail and cachectic. ? ? ?4/28 patient presented to ED after home health nurse identified hypotension.  Concern for recurrent sepsis.  Of note patient was just discharged from this facility 2 days prior ?4/29 PICC line placed ?5/1 still on levophed. Passed SLP swallow eval for mech soft diet.  ?5/2 ID recommending 4 weeks CTX and oral flagyl then f/u in ID clinic ?5/3 still on pressors. Goal SBP changed to > 90. Increased midodrine to 10 mg tid.  Refused medications intermittently throughout the day.  Remained on pressors.  Several episodes of hypoglycemia, growing concern for  encephalopathy ?5/4 encephalopathic on a.m. rounds, glucose 78, placed on D10 during evening rounds, stress dose steroids ordered.  ?5/5 Remains encephalopathic with intermittent agitation   ?5/6 Renal US given oliguria  ?5/7 Desaturated yesterday, back on 5L today. Renal US without significant findings, EEG with evidence of epileptogenicity but no seizure  ?5/8 Worsening respiratory status with increased secretions, poor airway clearance; requiring increased O2 support (Venturi mask 6L today). Intermittently hypotensive, ongoing periodic epileptogenic discharges on R.  Multiple discussions at bedside with family (sister, daughters). DNR/DNI. ?5/9 Improved secretion management today, requiring less O2 (on RA) but less responsive. Shivering noted on exam. Will wake to voice but not reliably following commands. Sister updated at bedside. ?5/11 >> transferred out of ICU to hospitalist service on 03/01/2022, sitting up in bed frail and cachectic, answering few questions in a  weak voice, still has NG tube, throat full of oral secretions and gurgling. ? ? Subjective:  ? ? [name] today in bed, extremely weak and breathing hard, unable to answer questions reliably but says no to any headache. ? ? Assessment  & Plan :  ? ?Sepsis due to sacral osteomyelitis, Sacral decubitus ulcers, POA - Previous Staph epi bacteremia 02/11/22, Sepsis pathophysiology has slightly improved, he is continuing his Rocephin and Flagyl combination with stop date of 03/20/2022.  Per previous discussions in ICU if there is further decline they will consider comfort measures.  Currently DNR but continue medical treatment.  Wound care following for sacral decubitus wounds, Imodium as needed for diarrhea, continue Foley catheter for skin hygiene, overall extremely poor prognosis.  Palliative care also consulted on 03/01/2022 and family updated. ? ? Hypotension, improving  -  Chronic component + hypovolemia + shock, was hypotensive with MAPs 50s  5/7PM, has been adequately hydrated, continue hydration gently on 03/01/2022, on high-dose midodrine which will be continued.  Monitor.   ?  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 Baseline per family he completes ADLs himself, schedules his appointments/rides -- fairly independent.  Currently very weak and frail, long-term EEG not show definite seizure noted despite epileptogenicity, seen by neurology currently on combination of  AEDs per Neuro (Keppra, Vimpat, valproate, Fycompa x 1), continue seizure precautions and monitor. ?  ?Acute metabolic encephalopathy- due to above.  Supportive care. ? ?AKI - Hx neurogenic bladder - in the setting of sepsis likely ATN due to prerenal etiology, has Foley catheter placed in ER, gently diurese when blood pressure allows, nonacute renal ultrasound, AKI has resolved.  At baseline he does in and out catheterization himself. ?  ?Hx cervical cord injury with resultant paraplegia , Hx R BKA, L AKA - Supportive care, PT/OT when able to participate in care ?  ?Severe protein calorie malnutrition - continue protein supplementation ?  ?Chronic anemia, anemia of critical disease - Trend CBC, Transfuse for Hgb < 7.0 or hemodynamically significant bleeding ?  ?Severe protein calorie malnutrition and hypoglycemia.  D10 drip.  NG tube feeds once aspiration improves. ? ?CBG (last 3)  ?Recent Labs  ?  03/02/22 ?0313 03/02/22 ?[name]:[id number] 03/02/22 ?0844  ?GLUCAP 109* 104* 106*  ? ? ? ?[place] - Per ICU team team DNR if deteriorates then focus on comfort care, ICU team discussed with patient's mother again on 03/02/2022.  Overall long-term prognosis appears very poor, still aspirating oral secretions, palliative care also on board. ? ? ? ?R. Arm PICC, Foley ? ?   ? ?Condition - Extremely Guarded ? ?Family Communication  :   ? ?Daughter [name] [phone] - 03/01/22  ?Mother [name] [phone] -  03/02/22 ?Brother [name]  [phone] - 03/01/22 ?Sister [name] [phone] on 03/01/2022 ? ? ?Code Status :  DNR ? ?Consults  :  PCCM, [place] ? ?PUD Prophylaxis :  PPI ? ? Procedures  :    ? ?  ? ?   ? ?Disposition Plan  :   ? ?  Status is: Inpatient ? ?DVT Prophylaxis  :   ? ?heparin injection 5,000 Units Start: 02/02/2022 2200 ? ?Lab Results  ?Component Value Date  ? PLT 126 (L) 03/02/2022  ? ? ?Diet :  ?Diet Order   ? ?       ?  Diet NPO time specified  Diet effective now       ?  ? ?  ?  ? ?  ?  ? ?Inpatient Medications ? ?Scheduled Meds: ? chlorhexidine  15 mL Mouth Rinse BID  ? Chlorhexidine Gluconate Cloth  6 each Topical Daily  ? feeding supplement (PROSource TF)  45 mL Per Tube BID  ? heparin  5,000 Units Subcutaneous Q8H  ? mouth rinse  15 mL Mouth Rinse q12n4p  ? midodrine  20 mg Per Tube TID WC  ? multivitamin with minerals  1 tablet Per Tube Daily  ? nutrition supplement (JUVEN)  1 packet Per Tube BID BM  ? pantoprazole sodium  40 mg Per Tube Daily  ? sodium chloride flush  10-40 mL Intracatheter Q12H  ? sodium hypochlorite   Irrigation BID  ? ?Continuous Infusions: ? cefTRIAXone (ROCEPHIN)  IV 2 g (03/01/22 1347)  ? dextrose 75 mL/hr at 03/02/22 1011  ? feeding supplement (VITAL 1.5 CAL) 1,000 mL (03/01/22 2319)  ? lacosamide (VIMPAT) IV 200 mg (03/02/22 1012)  ? levETIRAcetam 1,000 mg (03/02/22 0506)  ? metronidazole 500 mg (03/02/22 [id number])  ? valproate sodium 800 mg (03/02/22 0525)  ? ?PRN Meds:.Gerhardt's butt cream, HYDROmorphone (DILAUDID) injection, loperamide HCl ? ?Time Spent in minutes  30 ? ? ?Lala Lund M.D on 03/02/2022 at 11:37 AM ? ?To page [name] to [email or web address]  ? ?[facility] -  Office  [phone] ? ?See all Orders from today for further details ? ? ? Objective:  ? ?Vitals:  ? 03/02/22 0307 03/02/22 0331 03/02/22 0400 03/02/22 0800  ?BP: 93/76  94/62 (!) 75/52  ?Pulse: 80  86 76  ?Resp: 19  18 18   ?Temp: 98.1 ?F (36.7 ?C)   98.2 ?F (36.8 ?C)  ?TempSrc: Oral   Axillary  ?SpO2: 100%  99% 100%  ?Weight:   85 kg    ?Height:      ? ? ?Wt Readings from Last 3 Encounters:  ?03/02/22 85 kg  ?02/12/22 61.7 kg  ?12/17/21 61.7 kg  ? ? ? ?Intake/Output Summary (Last 24 hours) at 03/02/2022 1137 ?Last data filed at 03/02/2022 10

## 2022-03-03 DIAGNOSIS — J9601 Acute respiratory failure with hypoxia: Secondary | ICD-10-CM | POA: Diagnosis not present

## 2022-03-03 LAB — GLUCOSE, CAPILLARY
Glucose-Capillary: 84 mg/dL (ref 70–99)
Glucose-Capillary: 87 mg/dL (ref 70–99)
Glucose-Capillary: 88 mg/dL (ref 70–99)
Glucose-Capillary: 88 mg/dL (ref 70–99)

## 2022-03-03 LAB — CBC WITH DIFFERENTIAL/PLATELET
Abs Immature Granulocytes: 0.06 10*3/uL (ref 0.00–0.07)
Basophils Absolute: 0 10*3/uL (ref 0.0–0.1)
Basophils Relative: 0 %
Eosinophils Absolute: 0.1 10*3/uL (ref 0.0–0.5)
Eosinophils Relative: 0 %
HCT: 24.3 % — ABNORMAL LOW (ref 39.0–52.0)
Hemoglobin: 7.6 g/dL — ABNORMAL LOW (ref 13.0–17.0)
Immature Granulocytes: 1 %
Lymphocytes Relative: 19 %
Lymphs Abs: 2.3 10*3/uL (ref 0.7–4.0)
MCH: 30.3 pg (ref 26.0–34.0)
MCHC: 31.3 g/dL (ref 30.0–36.0)
MCV: 96.8 fL (ref 80.0–100.0)
Monocytes Absolute: 2.5 10*3/uL — ABNORMAL HIGH (ref 0.1–1.0)
Monocytes Relative: 20 %
Neutro Abs: 7.2 10*3/uL (ref 1.7–7.7)
Neutrophils Relative %: 60 %
Platelets: 174 10*3/uL (ref 150–400)
RBC: 2.51 MIL/uL — ABNORMAL LOW (ref 4.22–5.81)
RDW: 20.1 % — ABNORMAL HIGH (ref 11.5–15.5)
WBC: 12.1 10*3/uL — ABNORMAL HIGH (ref 4.0–10.5)
nRBC: 0.3 % — ABNORMAL HIGH (ref 0.0–0.2)

## 2022-03-03 LAB — COMPREHENSIVE METABOLIC PANEL
ALT: 7 U/L (ref 0–44)
AST: 13 U/L — ABNORMAL LOW (ref 15–41)
Albumin: <1.5 g/dL — ABNORMAL LOW (ref 3.5–5.0)
Alkaline Phosphatase: 52 U/L (ref 38–126)
Anion gap: 5 (ref 5–15)
BUN: 16 mg/dL (ref 6–20)
CO2: 27 mmol/L (ref 22–32)
Calcium: 7.5 mg/dL — ABNORMAL LOW (ref 8.9–10.3)
Chloride: 106 mmol/L (ref 98–111)
Creatinine, Ser: 0.62 mg/dL (ref 0.61–1.24)
GFR, Estimated: >60 mL/min (ref >60)
Glucose, Bld: 83 mg/dL (ref 70–99)
Potassium: 3.3 mmol/L — ABNORMAL LOW (ref 3.5–5.1)
Sodium: 138 mmol/L (ref 135–145)
Total Bilirubin: 0.1 mg/dL — ABNORMAL LOW (ref 0.3–1.2)
Total Protein: 5.3 g/dL — ABNORMAL LOW (ref 6.5–8.1)

## 2022-03-03 LAB — BRAIN NATRIURETIC PEPTIDE: B Natriuretic Peptide: 360.3 pg/mL — ABNORMAL HIGH (ref 0.0–100.0)

## 2022-03-03 LAB — MAGNESIUM: Magnesium: 1.6 mg/dL — ABNORMAL LOW (ref 1.7–2.4)

## 2022-03-03 MED ORDER — POTASSIUM CHLORIDE 20 MEQ PO PACK: 40.0000 meq | PACK | ORAL | Status: DC

## 2022-03-03 MED ORDER — VITAL 1.5 CAL PO LIQD: 1000.0000 mL | ORAL | Status: DC

## 2022-03-03 MED ORDER — SCOPOLAMINE 1 MG/3DAYS TD PT72
1.0000 | MEDICATED_PATCH | TRANSDERMAL | Status: DC
  Filled 2022-03-03: qty 1

## 2022-03-03 MED ORDER — MAGNESIUM SULFATE 2 GM/50ML IV SOLN: 2.0000 g | Freq: Once | INTRAVENOUS | Status: DC

## 2022-03-03 MED ORDER — SODIUM CHLORIDE 0.9 % IV BOLUS
500.0000 mL | Freq: Once | INTRAVENOUS | Status: AC
  Administered 2022-03-03: 500 mL via INTRAVENOUS

## 2022-03-03 MED ORDER — SODIUM CHLORIDE 0.9 % IV BOLUS
500.0000 mL | Freq: Once | INTRAVENOUS | Status: AC
Start: 2022-03-03 — End: 2022-03-03
  Administered 2022-03-03: 500 mL via INTRAVENOUS

## 2022-03-15 ENCOUNTER — Ambulatory Visit: Payer: PRIVATE HEALTH INSURANCE | Admitting: Internal Medicine

## 2022-03-22 NOTE — Progress Notes (Signed)
RT attempted to NTS pt at this time, pt pushed my hand away and told me no. MD made aware. ?

## 2022-03-22 NOTE — Progress Notes (Signed)
Pt O2 sat started to decrease to below 80, placed on O2 15 L N/C high flow, with not much improvement.  RT called, placed on non-rebreather, then pt started going into agonal rhytm and breathing and asystole, Dr Thedore Mins notified.   Charge nurse Desiray and Devin RN came to room examined pt.  MD came aroun 0930 examined pt and gave order for 500 ml of NS bolus, given.  At this time pt's heart rate went back up progressively to 50's, but still in agonal breathing. ?

## 2022-03-22 NOTE — Progress Notes (Signed)
Dr Thedore Mins notified about BP 83/54, no new order received, since pt baseline's BP is 80, as per MD. ?

## 2022-03-22 NOTE — Death Summary Note (Signed)
[facility] Death Note                                                                                                                                                                                               [name], is a 51 y.o. male, DOB - 09-15-1971, [id number]  Admit date - 01/29/2022   Admitting Physician Freddi Starr, MD  Outpatient Primary MD for the patient is System, Provider Not In  LOS - 34  Chief Complaint  Patient presents with   Blood Infection       Notification: System, Provider Not In notified of death of Mar 06, 2022   Date and Time of Death - 03/06/22 @ 9.54 am  Pronounced by - RN  History of present illness:   [name] is a 51 y.o. male with a history of -  spinal cord injury, baseline paraplegia, osteomyelitis of sacrum, L AKA/R BKA, neurogenic bladder requiring in and out caths, seizures, and prior wound dehiscence, he was brought in the ER with low blood pressure and was diagnosed with sepsis in the ER.  Work-up suggested patient was septic and bacteremic possible source was sacral osteomyelitis and decubitus ulcers, he was in ICU for 12 days and transferred to my service on 03/01/2022, he was still on NG tube, still not able to clear his airway and gurgling with oral secretions with ongoing aspiration, extremely frail and cachectic, he was DNR.  After multiple discussions with patient's family and ICU team it was decided to continue medical treatment, if declines to focus on comfort.  Patient despite supportive care continued to decline and develop worsening respiratory distress, he passed away on 2022-03-06 at 9:54 AM   Final Diagnoses:  Cause if death - sepsis, aspiration of gastric contents  Signature  Lala Lund M.D on 03/06/22 at 10:06 AM  [facility]  Office Phone [phone]  Total clinical and documentation time for today  Under 30 minutes   Last Note                                                          PROGRESS NOTE  Patient Demographics:    [name], is a 51 y.o. male, DOB - [birth year], [id number]  Outpatient Primary MD for the patient is System, Provider Not In    LOS - 26  Admit date - 01/26/2022    Chief Complaint  Patient presents with   Blood Infection       Brief Narrative (HPI from H&P)   - 51 year old man who presented to [facility] ED 4/28 via EMS for hypotension. Of note patient was just discharged from this facility 2 days prior for management of sepsis with bacteremia secondary to osteomyelitis. PMHx significant for spinal cord injury, baseline paraplegia, osteomyelitis of sacrum, L AKA/R BKA, neurogenic bladder requiring in and out caths, seizures, and prior wound dehiscence, he was brought in the ER with low blood pressure and was diagnosed with sepsis in the ER.  Work-up suggested patient was septic and bacteremic possible source was sacral osteomyelitis and decubitus ulcers, he was in ICU for 12 days and transferred to my service on 03/01/2022, he still has an NG tube, still not able to clear his airway and gurgling with oral secretions, extremely frail and cachectic.   4/28 patient presented to ED after home health nurse identified hypotension.  Concern for recurrent sepsis.  Of note patient was just discharged from this facility 2 days prior 4/29 PICC line placed 5/1 still on levophed. Passed SLP swallow eval for mech soft diet.  5/2 ID recommending 4 weeks CTX and oral flagyl then f/u in ID clinic 5/3 still on pressors. Goal SBP changed to > 90. Increased midodrine to 10 mg tid.  Refused medications intermittently throughout the day.  Remained on pressors.  Several episodes of  hypoglycemia, growing concern for encephalopathy 5/4 encephalopathic on a.m. rounds, glucose 78, placed on D10 during evening rounds, stress dose steroids ordered.  5/5 Remains encephalopathic with intermittent agitation   5/6 Renal US given oliguria  5/7 Desaturated yesterday, back on 5L today. Renal US without significant findings, EEG with evidence of epileptogenicity but no seizure  5/8 Worsening respiratory status with increased secretions, poor airway clearance; requiring increased O2 support (Venturi mask 6L today). Intermittently hypotensive, ongoing periodic epileptogenic discharges on R. Multiple discussions at bedside with family (sister, daughters). DNR/DNI. 5/9 Improved secretion management today, requiring less O2 (on RA) but less responsive. Shivering noted on exam. Will wake to voice but not reliably following commands. Sister updated at bedside. 5/11 >> transferred out of ICU to hospitalist service on 03/01/2022, sitting up in bed frail and cachectic, answering few questions in a  weak voice, still has NG tube, throat full of oral secretions and gurgling.   Subjective:    [name] today in bed, extremely weak and breathing hard, denies any chest pain.   Assessment  & Plan :   Sepsis due to sacral osteomyelitis, Sacral decubitus ulcers, POA - Previous Staph epi bacteremia 02/11/22, Sepsis pathophysiology has slightly improved, he is continuing his Rocephin and Flagyl combination with stop date of 03/20/2022.  Per previous discussions in ICU if there is further decline they will consider comfort measures.  Currently DNR but continue medical treatment.  Wound care following for sacral decubitus wounds, Imodium as needed for diarrhea, continue Foley catheter for skin hygiene, overall extremely poor prognosis.  Palliative care also consulted on 03/01/2022 and family updated on a daily basis clearly told that patient seems to be actively dying and likely will pass away in a few days if he  does not improve.  Ongoing aspiration of oral  secretions.  Has upper airway gurgling which is audible without using a stethoscope, NG tube feeds were held for 48 hours with some improvement but still has coarse bilateral breath sounds and very short of breath, also discussed with pulmonary critical care on 5 09/10/2012 and 03/02/2022, critical care if declines full comfort measure.  This was also communicated with the family.  Overall prognosis remains extremely poor.  Palliative care involved.  He is now developing fatigue from intense breathing despite oxygen supplementation, unfortunately he refuses suctioning despite multiple efforts, prognosis is very poor.    Hypotension, acute on chronic-  Chronic component + hypovolemia + shock, was hypotensive with MAPs 50s 5/7PM, has been adequately hydrated, continue hydration and double max dose midodrine, monitor.  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 Baseline per family he completes ADLs himself, schedules his appointments/rides -- fairly independent.  Currently very weak and frail, long-term EEG not show definite seizure noted despite epileptogenicity, seen by neurology currently on combination of  AEDs per Neuro (Keppra, Vimpat, valproate, Fycompa x 1), continue seizure precautions and monitor.   Acute metabolic encephalopathy- due to above.  Supportive care.  AKI - Hx neurogenic bladder - in the setting of sepsis likely ATN due to prerenal etiology, has Foley catheter placed in ER, gently diurese when blood pressure allows, nonacute renal ultrasound, AKI has resolved.  At baseline he does in and out catheterization himself.   Hx cervical cord injury with resultant paraplegia , Hx R BKA, L AKA - Supportive care, PT/OT when able to participate in care   Severe protein calorie malnutrition - continue protein supplementation   Chronic anemia,  anemia of critical disease - Trend CBC, Transfuse for Hgb < 7.0 or hemodynamically significant bleeding   Hypokalemia and hypomagnesemia.  Replaced.    Severe protein calorie malnutrition and hypoglycemia.  D10 drip.  NG tube feeds once aspiration improves.  CBG (last 3)  Recent Labs    Mar 14, 2022 0216 Mar 14, 2022 0407 03/14/2022 0804  GLUCAP 88 84 87     GOC - Per ICU team team DNR if deteriorates then focus on comfort care, ICU team discussed with patient's mother again on 03/02/2022.  Overall long-term prognosis appears very poor, still aspirating oral secretions, palliative care also on board.  Plan is to continue medical treatment, with full knowledge that patient likely to pass away.    R. Arm PICC, Foley      Condition - Extremely Guarded  Family Communication  :    Daughter Denton Ar [phone] - 03/01/22  Mother Arville Go [phone] -  03/02/22, 2022-03-14 Brother Ebony Hail [phone] - 03/01/22 Sister Brayton Layman [phone] on 03/01/2022   Code Status :  DNR  Consults  :  PCCM, Delia  PUD Prophylaxis :  PPI   Procedures  :            Disposition Plan  :    Status is: Inpatient  DVT Prophylaxis  :    heparin injection 5,000 Units Start: 01/31/2022 2200  Lab Results  Component Value Date   PLT 174 03/14/2022    Diet :  Diet Order             Diet NPO time specified  Diet effective now                    Inpatient Medications  Scheduled Meds:  chlorhexidine  15 mL Mouth Rinse BID   Chlorhexidine Gluconate Cloth  6 each Topical Daily   feeding supplement (PROSource TF)  45 mL Per Tube BID   heparin  5,000 Units Subcutaneous Q8H   mouth rinse  15 mL Mouth Rinse q12n4p   midodrine  20 mg Per Tube TID WC   multivitamin with minerals  1 tablet Per Tube Daily   nutrition supplement (JUVEN)  1 packet Per Tube BID BM   pantoprazole sodium  40 mg Per Tube Daily   potassium chloride  40 mEq Per Tube Q4H   scopolamine  1 patch Transdermal Q72H   sodium  chloride flush  10-40 mL Intracatheter Q12H   sodium hypochlorite   Irrigation BID   Continuous Infusions:  cefTRIAXone (ROCEPHIN)  IV 2 g (03/02/22 1402)   dextrose 100 mL/hr (03-21-2022 0539)   [START ON 03/04/2022] feeding supplement (VITAL 1.5 CAL)     lacosamide (VIMPAT) IV 200 mg (03/02/22 2116)   levETIRAcetam 1,000 mg (Mar 21, 2022 0541)   magnesium sulfate bolus IVPB     metronidazole 500 mg (03/02/22 2338)   valproate sodium 800 mg (03/21/22 0605)   PRN Meds:.Gerhardt's butt cream, HYDROmorphone (DILAUDID) injection, loperamide HCl  Time Spent in minutes  30   Lala Lund M.D on 03-21-2022 at 10:06 AM  To page go to [email or web address]   [facility] -  Office  [phone]  See all Orders from today for further details    Objective:   Vitals:   03/21/22 0000 03-21-22 0200 03-21-2022 0400 03/21/2022 0807  BP: 93/60 (!) 86/55 (!) 80/61 (!) 82/51  Pulse: 83 92 (!) 103 89  Resp: 19 (!) 25 17 (!) 22  Temp:   98.4 F (36.9 C) (!) 97.3 F (36.3 C)  TempSrc:   Axillary Axillary  SpO2: 100% 100% 94% 96%  Weight:      Height:        Wt Readings from Last 3 Encounters:  03/02/22 85 kg  02/12/22 61.7 kg  12/17/21 61.7 kg     Intake/Output Summary (Last 24 hours) at 2022-03-21 1006 Last data filed at 03/21/22 0543 Gross per 24 hour  Intake 1908.45 ml  Output 3850 ml  Net -1941.55 ml     Physical Exam  Awake, NG in place, appears extremely weak and cachectic, breathing hard, and looks fatigued Porter.AT,PERRAL Supple Neck, No JVD,   Symmetrical Chest wall movement, Good air movement bilaterally, coarse Bilat B sounds RRR,No Gallops, Rubs or new Murmurs,  +ve B.Sounds, Abd Soft, No tenderness,   L AKA/R BKA    RN pressure injury documentation: Pressure Injury 12/17/21 Thigh Left;Posterior;Proximal Unstageable - Full thickness tissue loss in which the base of the injury is covered by slough (yellow, tan, gray, green or brown) and/or eschar (tan, brown or black) in  the wound bed. Left Inner Glut (Active)  12/17/21 1800  Location: Thigh  Location Orientation: Left;Posterior;Proximal  Staging: Unstageable - Full thickness tissue loss in which the base of the injury is covered by slough (yellow, tan, gray, green or brown) and/or eschar (tan, brown or black) in the wound bed.  Wound Description (Comments): Left Inner Gluteal fold buttocks  Present on Admission: Yes     Pressure Injury 12/17/21 Hip Right;Posterior Deep Tissue Pressure Injury - Purple or maroon localized area of discolored intact skin or blood-filled blister due to damage of underlying soft tissue from pressure and/or shear. Stage 2 area pink with inner  (Active)  12/17/21 1800  Location: Hip  Location Orientation: Right;Posterior  Staging: Deep Tissue Pressure Injury - Purple or maroon localized area of discolored intact skin or blood-filled blister due to damage of underlying soft tissue from pressure and/or shear.  Wound Description (Comments): Stage 2 area pink with inner area 2x1 cm with skin abrasion  Present on Admission: Yes  Dressing Type ABD;Dakin's-soaked gauze 03/02/22 1500     Pressure Injury 12/17/21 Pretibial Proximal;Right Stage 2 -  Partial thickness loss of dermis presenting as a shallow open injury with a red, pink wound bed without slough. Right leg below knee (Active)  12/17/21 1800  Location: Pretibial  Location Orientation: Proximal;Right  Staging: Stage 2 -  Partial thickness loss of dermis presenting as a shallow open injury with a red, pink wound bed without slough.  Wound Description (Comments): Right leg below knee  Present on Admission: Yes     Pressure Injury 12/17/21 Buttocks Right Stage 2 -  Partial thickness loss of dermis presenting as a shallow open injury with a red, pink wound bed without slough. Right inner buttocks (Active)  12/17/21 1800  Location: Buttocks  Location Orientation: Right  Staging: Stage 2 -  Partial thickness loss of dermis  presenting as a shallow open injury with a red, pink wound bed without slough.  Wound Description (Comments): Right inner buttocks  Present on Admission: Yes  Dressing Type ABD 03/02/22 1500     Pressure Injury 02/06/22 Ischial tuberosity Left Stage 4 - Full thickness tissue loss with exposed bone, tendon or muscle. large and deep wound, muscle and bone exposed. PT ONLY (Active)  02/06/22 1000  Location: Ischial tuberosity  Location Orientation: Left  Staging: Stage 4 - Full thickness tissue loss with exposed bone, tendon or muscle.  Wound Description (Comments): large and deep wound, muscle and bone exposed. PT ONLY  Present on Admission:   Dressing Type ABD;Dakin's-soaked gauze 03/02/22 1500     Pressure Injury 02/09/22 Hip Left;Lateral Stage 4 - Full thickness tissue loss with exposed bone, tendon or muscle. Greater Trochanter Wound PT HYDRO Only (Active)  02/09/22 1326  Location: Hip  Location Orientation: Left;Lateral  Staging: Stage 4 - Full thickness tissue loss with exposed bone, tendon or muscle.  Wound Description (Comments): Greater Trochanter Wound PT HYDRO Only  Present on Admission: Yes  Dressing Type ABD;Dakin's-soaked gauze 03/02/22 1500     Data Review:    CBC Recent Labs  Lab 02/27/22 0452 02/28/22 0534 03/01/22 0838 03/02/22 0305 Mar 22, 2022 0316  WBC 14.2* 10.4 12.0* 10.9* 12.1*  HGB 7.9* 7.7* 7.7* 7.6* 7.6*  HCT 24.3* 24.0* 24.3* 24.5* 24.3*  PLT PLATELET CLUMPS NOTED ON SMEAR, UNABLE TO ESTIMATE 110* 115* 126* 174  MCV 93.5 95.2 96.8 98.0 96.8  MCH 30.4 30.6 30.7 30.4 30.3  MCHC 32.5 32.1 31.7 31.0 31.3  RDW 19.2* 20.1* 21.1* 20.9* 20.1*  LYMPHSABS  --   --   --  1.7 2.3  MONOABS  --   --   --  2.5* 2.5*  EOSABS  --   --   --  0.0 0.1  BASOSABS  --   --   --  0.0 0.0    Electrolytes Recent Labs  Lab 02/26/22 1335 02/27/22 0452 02/28/22 0534 03/01/22 0838 03/02/22 0305 2022/03/22 0316 March 22, 2022 0317  NA  --  140 144 145 140 138  --   K  --   3.5 3.5 4.3 4.1 3.3*  --   CL  --  113* 115* 114* 111 106  --  CO2  --  24 25 27 26 27   --   GLUCOSE  --  184* 73 89 169* 83  --   BUN  --  18 14 16 15 16   --   CREATININE  --  0.49* 0.51* 0.55* 0.62 0.62  --   CALCIUM  --  7.5* 7.2* 7.8* 7.3* 7.5*  --   AST  --   --   --   --  12* 13*  --   ALT  --   --   --   --  6 7  --   ALKPHOS  --   --   --   --  52 52  --   BILITOT  --   --   --   --  0.3 0.1*  --   ALBUMIN  --   --   --   --  <1.5* <1.5*  --   MG  --  1.7 1.5* 2.0 1.7 1.6*  --   AMMONIA 33  --   --   --   --   --   --   BNP  --   --   --  335.7* 1,261.7*  --  360.3*    ------------------------------------------------------------------------------------------------------------------ No results for input(s): CHOL, HDL, LDLCALC, TRIG, CHOLHDL, LDLDIRECT in the last 72 hours.  No results found for: HGBA1C  No results for input(s): TSH, T4TOTAL, T3FREE, THYROIDAB in the last 72 hours.  Invalid input(s): FREET3 ------------------------------------------------------------------------------------------------------------------ ID Labs Recent Labs  Lab 02/27/22 0452 02/28/22 0534 03/01/22 0838 03/02/22 0305 2022/04/01 0316  WBC 14.2* 10.4 12.0* 10.9* 12.1*  PLT PLATELET CLUMPS NOTED ON SMEAR, UNABLE TO ESTIMATE 110* 115* 126* 174  CREATININE 0.49* 0.51* 0.55* 0.62 0.62       Radiology Reports DG Chest Port 1 View  Result Date: 03/01/2022 CLINICAL DATA:  Shortness of breath EXAM: PORTABLE CHEST 1 VIEW COMPARISON:  Feb 26 2022 FINDINGS: Right arm PICC line with its tip at the lower SVC region, stable. Again seen is a feeding tube and its distal end is not visualized. There is airspace disease seen predominantly at the right mid and lower lung zones and has mildly improved. Minimal airspace disease at the left lower lobe and is more conspicuous in the present study. Small right pleural effusion greater on the right and has mildly improved. IMPRESSION: Mild improvement of the  airspace disease at the right mid and lower lung zones. Bilateral pleural effusion greater on the right and has improved in the interim as well. Electronically Signed   By: Frazier Richards M.D.   On: 03/01/2022 08:29

## 2022-03-22 NOTE — Progress Notes (Addendum)
?                     PROGRESS NOTE                                             ?                                                                                                                     ?                                         ? ? Patient Demographics:  ? ? [name], is a 51 y.o. male, DOB - 01-Jun-1971, GYI:[id number] ? ?Outpatient Primary MD for the patient is System, Provider Not In    LOS - 15  Admit date - 01/31/2022   ? ?Chief Complaint  ?Patient presents with  ? Blood Infection  ?    ? ?Brief Narrative (HPI from H&P)   - 51 year old man who presented to [facility] ED 4/28 via EMS for hypotension. Of note patient was just discharged from this facility 2 days prior for management of sepsis with bacteremia secondary to osteomyelitis. PMHx significant for spinal cord injury, baseline paraplegia, osteomyelitis of sacrum, L AKA/R BKA, neurogenic bladder requiring in and out caths, seizures, and prior wound dehiscence, he was brought in the ER with low blood pressure and was diagnosed with sepsis in the ER.  Work-up suggested patient was septic and bacteremic possible source was sacral osteomyelitis and decubitus ulcers, he was in ICU for 12 days and transferred to my service on 03/01/2022, he still has an NG tube, still not able to clear his airway and gurgling with oral secretions, extremely frail and cachectic. ? ? ?4/28 patient presented to ED after home health nurse identified hypotension.  Concern for recurrent sepsis.  Of note patient was just discharged from this facility 2 days prior ?4/29 PICC line placed ?5/1 still on levophed. Passed SLP swallow eval for mech soft diet.  ?5/2 ID recommending 4 weeks CTX and oral flagyl then f/u in ID clinic ?5/3 still on pressors. Goal SBP changed to > 90. Increased midodrine to 10 mg tid.  Refused medications intermittently throughout the day.  Remained on pressors.  Several episodes of hypoglycemia, growing concern for  encephalopathy ?5/4 encephalopathic on a.m. rounds, glucose 78, placed on D10 during evening rounds, stress dose steroids ordered.  ?5/5 Remains encephalopathic with intermittent agitation   ?5/6 Renal US given oliguria  ?5/7 Desaturated yesterday, back on 5L today. Renal US without significant findings, EEG with evidence of epileptogenicity but no seizure  ?5/8 Worsening respiratory status with increased secretions, poor airway clearance; requiring increased O2 support (Venturi mask 6L today). Intermittently hypotensive, ongoing periodic epileptogenic discharges on R.  Multiple discussions at bedside with family (sister, daughters). DNR/DNI. ?5/9 Improved secretion management today, requiring less O2 (on RA) but less responsive. Shivering noted on exam. Will wake to voice but not reliably following commands. Sister updated at bedside. ?5/11 >> transferred out of ICU to hospitalist service on 03/01/2022, sitting up in bed frail and cachectic, answering few questions in a  weak voice, still has NG tube, throat full of oral secretions and gurgling. ? ? Subjective:  ? ? [name] today in bed, extremely weak and breathing hard, denies any chest pain. ? ? Assessment  & Plan :  ? ?Sepsis due to sacral osteomyelitis, Sacral decubitus ulcers, POA - Previous Staph epi bacteremia 02/11/22, Sepsis pathophysiology has slightly improved, he is continuing his Rocephin and Flagyl combination with stop date of 03/20/2022.  Per previous discussions in ICU if there is further decline they will consider comfort measures.  Currently DNR but continue medical treatment.  Wound care following for sacral decubitus wounds, Imodium as needed for diarrhea, continue Foley catheter for skin hygiene, overall extremely poor prognosis.  Palliative care also consulted on 03/01/2022 and family updated on a daily basis clearly told that patient seems to be actively dying and likely will pass away in a few days if he does not improve. ? ?Ongoing  aspiration of oral secretions.  Has upper airway gurgling which is audible without using a stethoscope, NG tube feeds were held for 48 hours with some improvement but still has coarse bilateral breath sounds and very short of breath, also discussed with pulmonary critical care on 5 09/10/2012 and 03/02/2022, critical care if declines full comfort measure.  This was also communicated with the family.  Overall prognosis remains extremely poor.  Palliative care involved.  He is now developing fatigue from intense breathing despite oxygen supplementation, unfortunately he refuses suctioning despite multiple efforts, prognosis is very poor.   ? ?Hypotension, acute on chronic-  Chronic component + hypovolemia + shock, was hypotensive with MAPs 50s 5/7PM, has been adequately hydrated, continue hydration and double max dose midodrine, monitor.  Prognosis is not good ?  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 Baseline per family he completes ADLs himself, schedules his appointments/rides -- fairly independent.  Currently very weak and frail, long-term EEG not show definite seizure noted despite epileptogenicity, seen by neurology currently on combination of  AEDs per Neuro (Keppra, Vimpat, valproate, Fycompa x 1), continue seizure precautions and monitor. ?  ?Acute metabolic encephalopathy- due to above.  Supportive care. ? ?AKI - Hx neurogenic bladder - in the setting of sepsis likely ATN due to prerenal etiology, has Foley catheter placed in ER, gently diurese when blood pressure allows, nonacute renal ultrasound, AKI has resolved.  At baseline he does in and out catheterization himself. ?  ?Hx cervical cord injury with resultant paraplegia , Hx R BKA, L AKA - Supportive care, PT/OT when able to participate in care ?  ?Severe protein calorie malnutrition - continue protein supplementation ?  ?Chronic anemia, anemia of critical disease -  Trend CBC, Transfuse for Hgb < 7.0 or hemodynamically significant bleeding ?  ?Hypokalemia and hypomagnesemia.  Replaced.   ? ?Severe protein calorie malnutrition and hypoglycemia.  D10 drip.  NG tube feeds once aspiration improves. ? ?CBG (last 3)  ?Recent Labs  ?  03/01/2022 ?0216 02/28/2022 ?0407 03/10/2022 ?0804  ?GLUCAP 88 84 87  ? ? ? ?GOC - Per  ICU team team DNR if deteriorates then focus on comfort care, ICU team discussed with patient's mother again on 03/02/2022.  Overall long-term prognosis appears very poor, still aspirating oral secretions, palliative care also on board.  Plan is to continue medical treatment, with full knowledge that patient likely to pass away. ? ? ? ?R. Arm PICC, Foley ? ?   ? ?Condition - Extremely Guarded ? ?Family Communication  :   ? ?Daughter Colin Mulders [phone] - 03/01/22  ?Mother Chyrl Civatte [phone] -  03/02/22, 03/24/2022 ?Brother Lahoma Rocker [phone] - 03/01/22 ?Sister [name] [phone] on 03/01/2022 ? ? ?Code Status :  DNR ? ?Consults  :  PCCM, Pall Care ? ?PUD Prophylaxis :  PPI ? ? Procedures  :    ? ?  ? ?   ? ?Disposition Plan  :   ? ?Status is: Inpatient ? ?DVT Prophylaxis  :   ? ?heparin injection 5,000 Units Start: 02/14/2022 2200 ? ?Lab Results  ?Component Value Date  ? PLT 174 March 24, 2022  ? ? ?Diet :  ?Diet Order   ? ?       ?  Diet NPO time specified  Diet effective now       ?  ? ?  ?  ? ?  ?  ? ?Inpatient Medications ? ?Scheduled Meds: ? chlorhexidine  15 mL Mouth Rinse BID  ? Chlorhexidine Gluconate Cloth  6 each Topical Daily  ? feeding supplement (PROSource TF)  45 mL Per Tube BID  ? heparin  5,000 Units Subcutaneous Q8H  ? mouth rinse  15 mL Mouth Rinse q12n4p  ? midodrine  20 mg Per Tube TID WC  ? multivitamin with minerals  1 tablet Per Tube Daily  ? nutrition supplement (JUVEN)  1 packet Per Tube BID BM  ? pantoprazole sodium  40 mg Per Tube Daily  ? potassium chloride  40 mEq Per Tube Q4H  ? scopolamine  1 patch Transdermal Q72H  ? sodium chloride flush  10-40 mL  Intracatheter Q12H  ? sodium hypochlorite   Irrigation BID  ? ?Continuous Infusions: ? cefTRIAXone (ROCEPHIN)  IV 2 g (03/02/22 1402)  ? dextrose 100 mL/hr (03-24-2022 0539)  ? [START ON 03/04/2022] feeding supplement (VITA

## 2022-03-22 NOTE — Progress Notes (Signed)
RT note-Called for patient with increase work of breathing, on 15L [place], sp02 84%, attempted to NTS, patient didn't want to be suctioned. HR and B/P continued to drop, NRB placed for comfort. Comfort at the bedside with, myself, RN tech and RN as patient continues to have agonal respirations.  ?

## 2022-03-22 DEATH — deceased

## 2022-06-29 DIAGNOSIS — Z515 Encounter for palliative care: Secondary | ICD-10-CM

## 2022-10-24 ENCOUNTER — Ambulatory Visit: Payer: PRIVATE HEALTH INSURANCE | Admitting: Neurology

## 2023-02-23 IMAGING — DX DG CHEST 1V PORT
1 series · 1 of 1 positions shown · non-contrast
Comparison: 02/22/2022

CLINICAL DATA: Hypoxia/sepsis.

EXAM:
PORTABLE CHEST 1 VIEW

[chest ap]
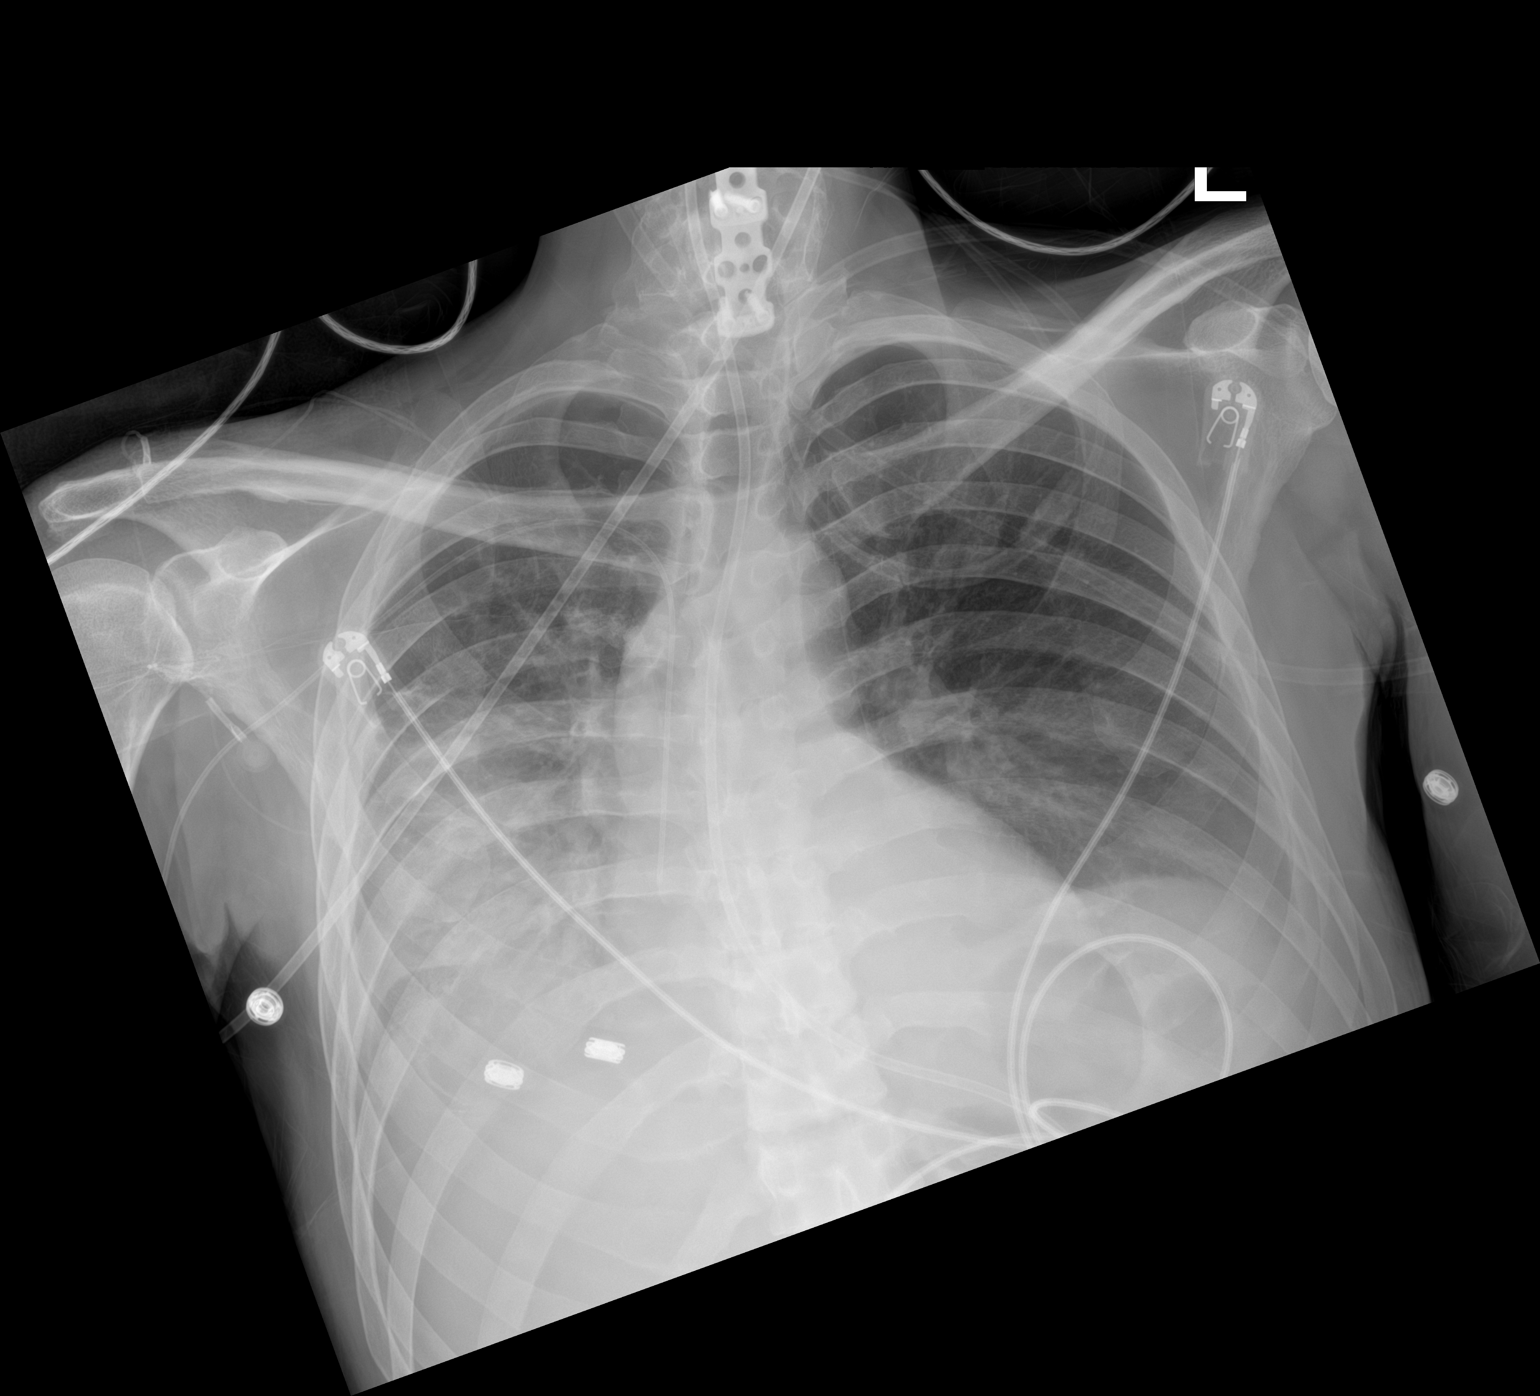

[1 of 1 positions shown; findings below may reference images not displayed]

FINDINGS: Right-sided PICC line demonstrates slight interval advancement with
tip over the right atrium. Enteric tube courses into the region of
the stomach and off the film as tip is not visualized. Patient is
rotated to the left. Lungs are hypoinflated and demonstrate
worsening hazy airspace opacification over the right mid to lower
lung which may be due to infection versus layering fluid with
atelectasis. Left lung is essentially clear. Cardiomediastinal
silhouette and remainder of the exam is unchanged.
IMPRESSION: 1. Worsening hazy airspace process over the right mid to lower lung
which may be due to infection versus layering fluid with
atelectasis.
2. Tubes and lines as described. No slight interval advancement of
the right-sided PICC line which has tip over the right atrium as
this could be pulled back approximately 4-5 cm.

## 2023-02-23 IMAGING — US US RENAL
2 series · 14 of 25 positions shown · non-contrast
Comparison: 02/16/2022 CT and 02/01/2022 ultrasound

CLINICAL DATA: 50-year-old male with oliguria.

EXAM:
RENAL / URINARY TRACT ULTRASOUND COMPLETE

[Series 1: us renal · 13 of 66 slices shown]
[im 1/66]
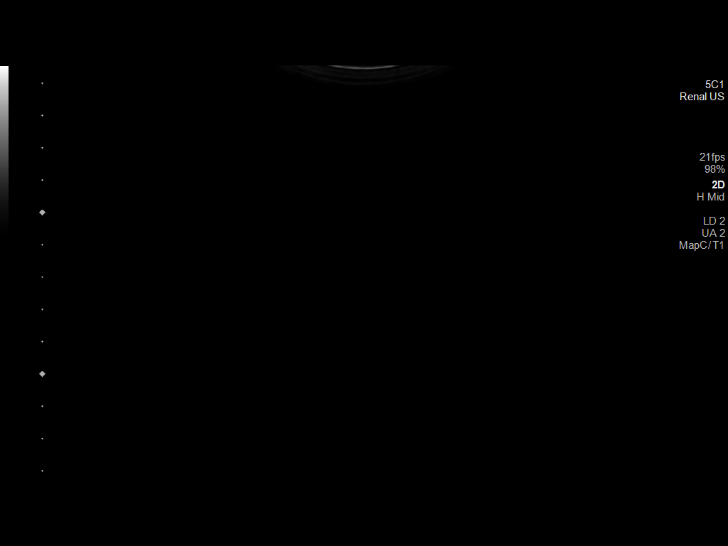
[im 6/66]
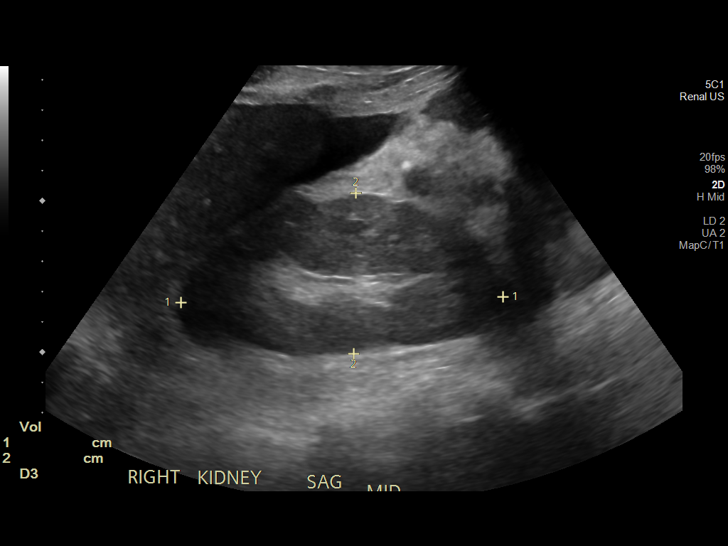
[im 12/66]
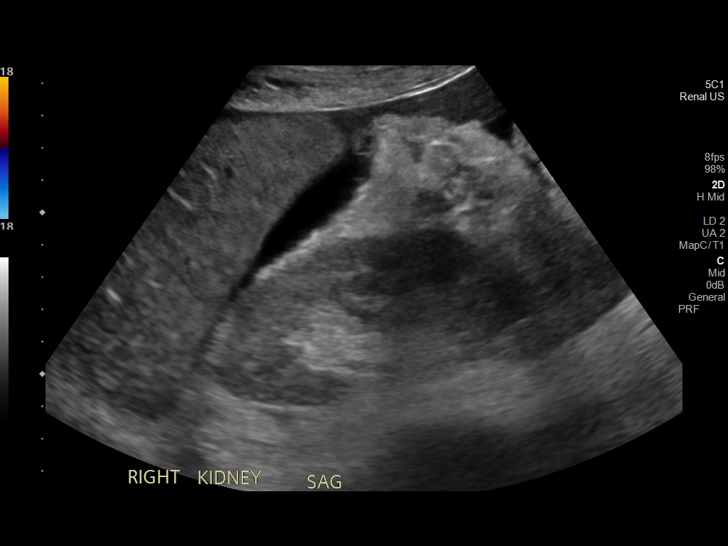
[im 18/66]
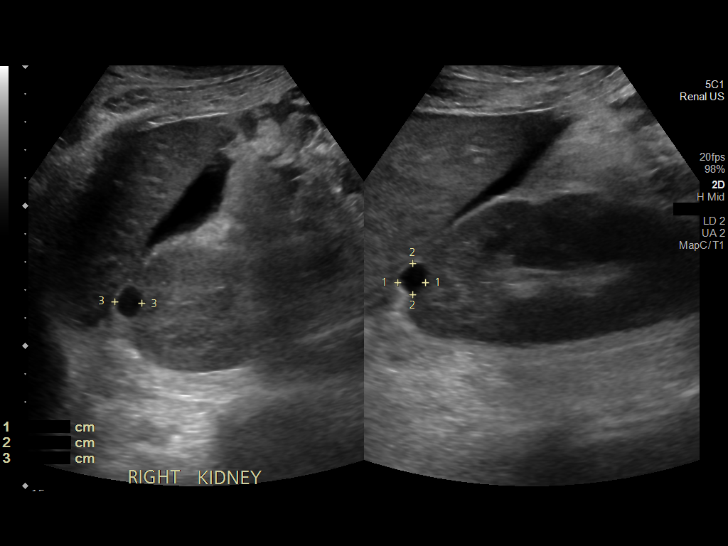
[im 24/66]
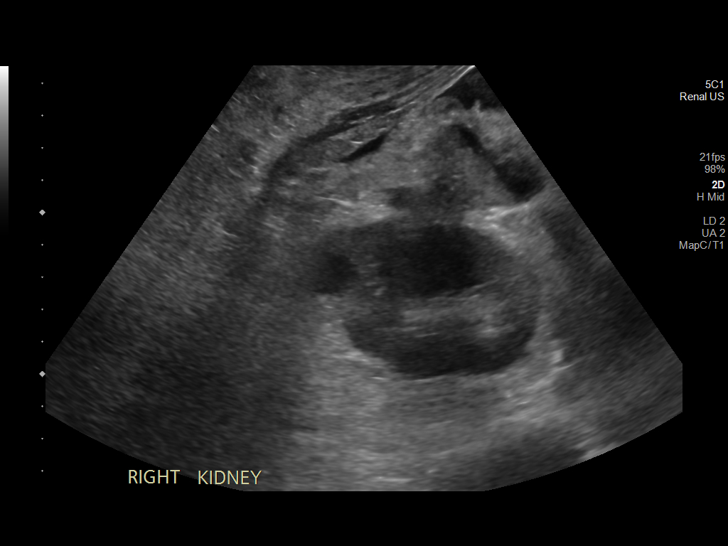
[im 27/66]
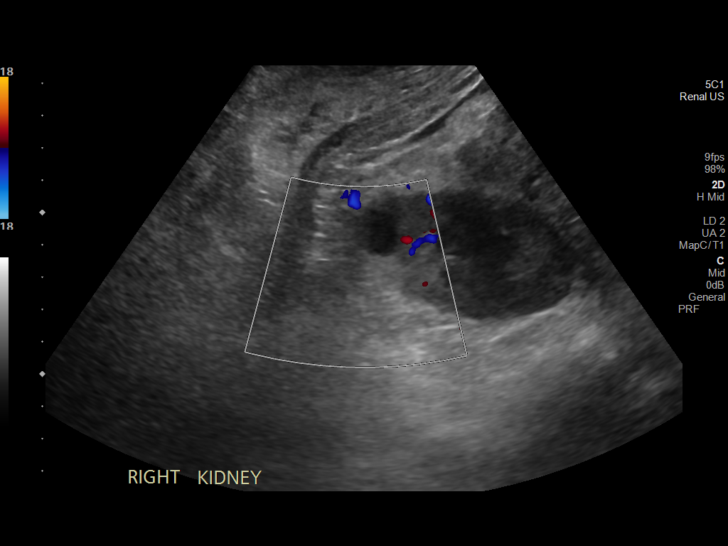
[im 33/66]
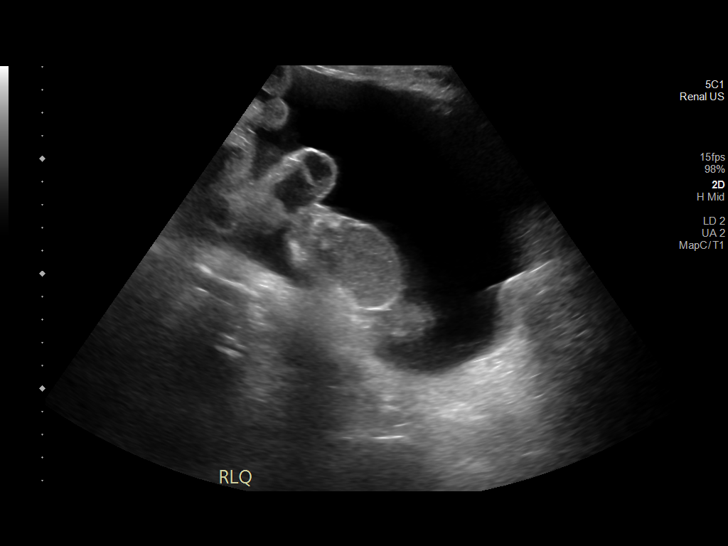
[im 39/66]
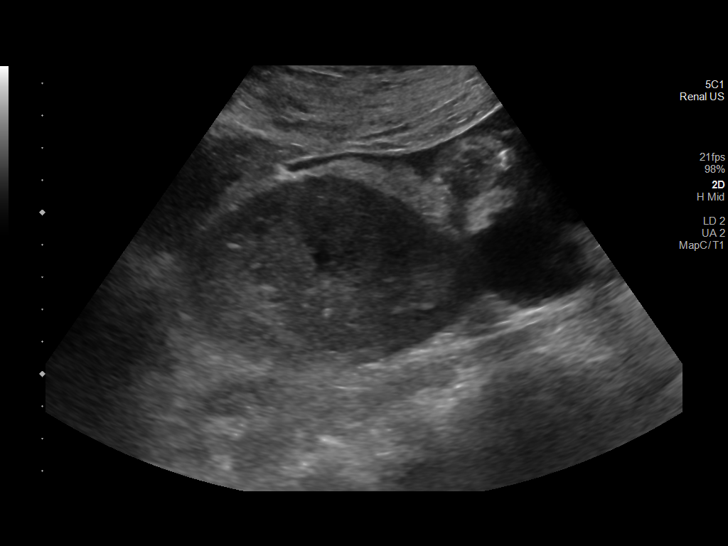
[im 45/66]
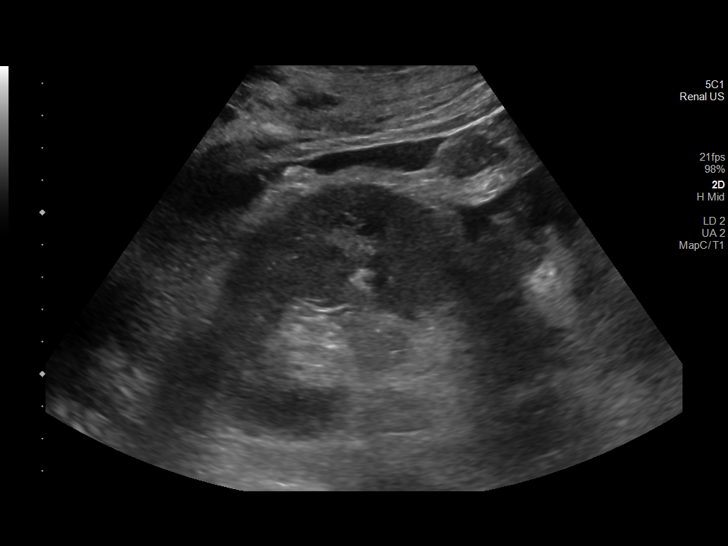
[im 48/66]
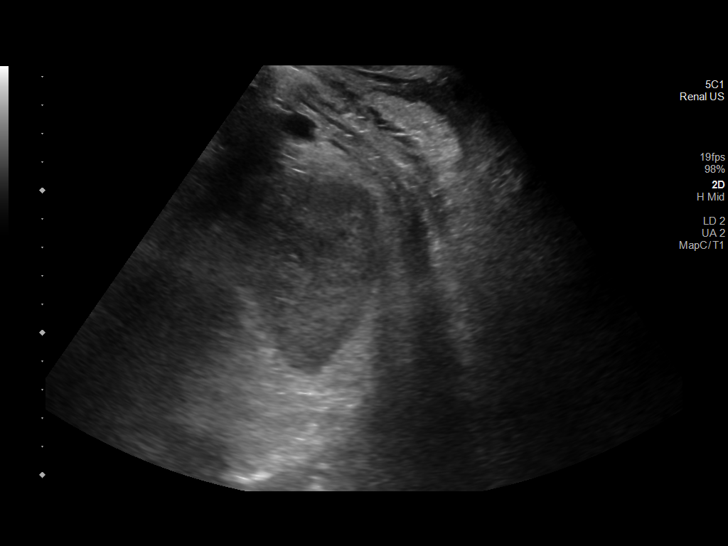
[im 54/66]
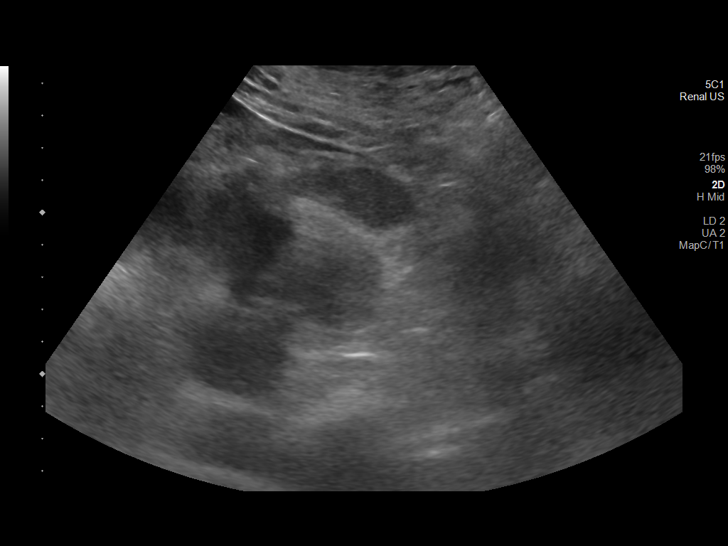
[im 60/66]
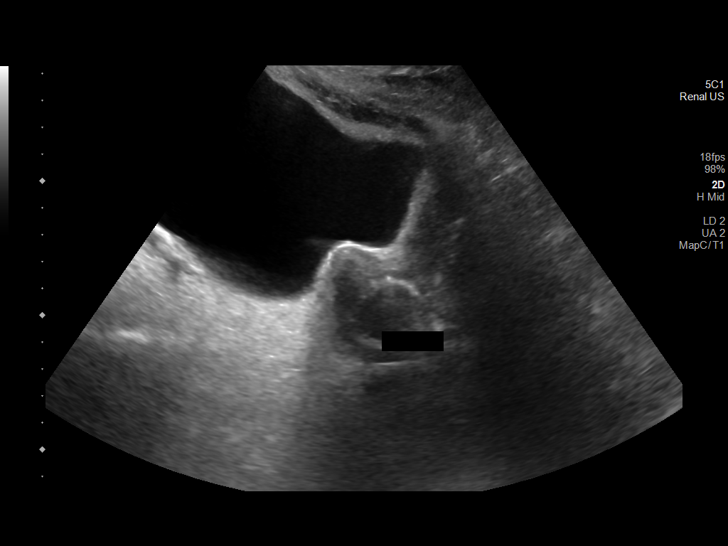
[im 66/66]
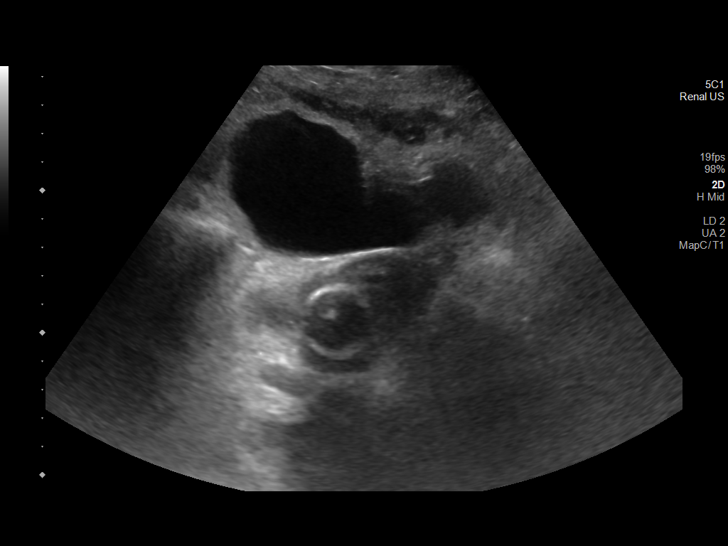

[Series 1001: renal us · 1 of 5 slices shown]
[im 5/5]
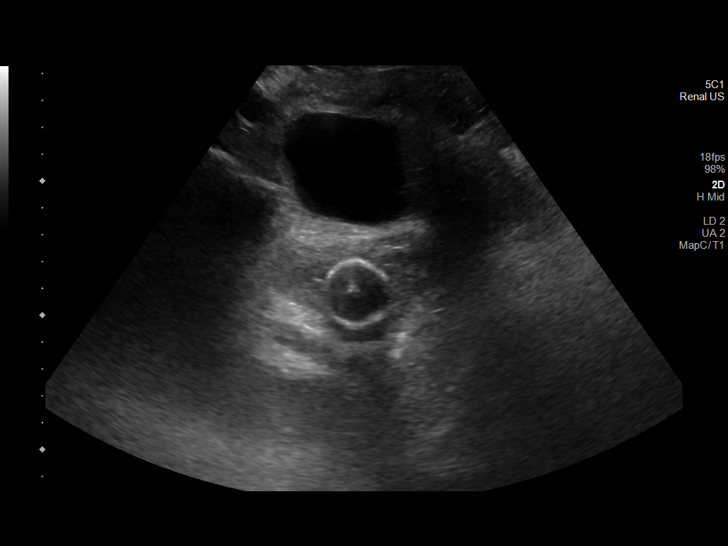

[14 of 25 positions shown; findings below may reference images not displayed]

FINDINGS: Right Kidney:

Renal measurements: 10.7 x 5.3 x 5.5 cm = volume: 161 mL. UPPER
limits of normal renal echogenicity noted. Renal cysts are present.
No hydronephrosis or solid renal mass identified.

Left Kidney:

Renal measurements: 9.9 x 6.3 x 5.6 cm = volume: 184 mL. UPPER
limits normal renal echogenicity noted. There is no evidence of
hydronephrosis or solid renal mass.

Bladder:

Foley catheter within a collapsed bladder noted.

Other:

Moderate ascites within the abdomen and pelvis noted.
IMPRESSION: 1. UPPER limits normal renal echogenicity. No evidence of
hydronephrosis.
2. Ascites.

## 2023-02-28 IMAGING — DX DG CHEST 1V PORT
1 series · 1 of 1 positions shown · non-contrast
Comparison: February 26, 2022

CLINICAL DATA: Shortness of breath

EXAM:
PORTABLE CHEST 1 VIEW

[chest ap]
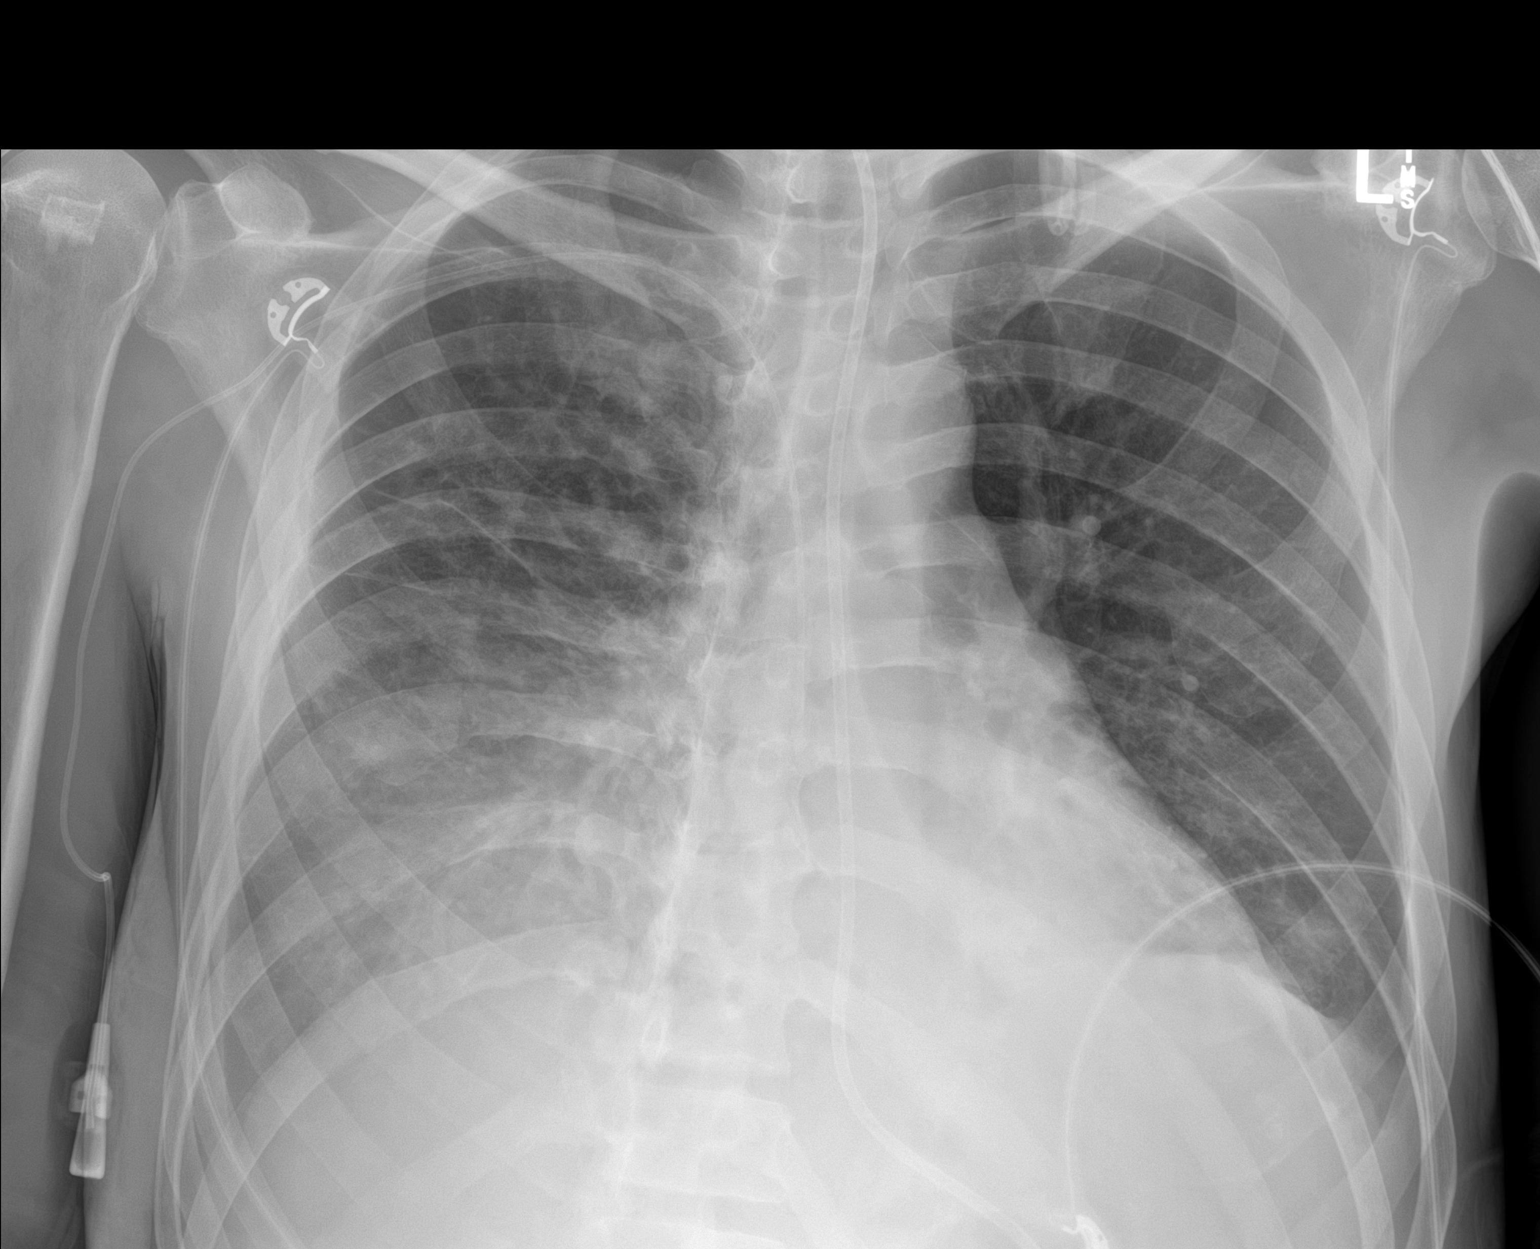

[1 of 1 positions shown; findings below may reference images not displayed]

FINDINGS: Right arm PICC line with its tip at the lower SVC region, stable.
Again seen is a feeding tube and its distal end is not visualized.
There is airspace disease seen predominantly at the right mid and
lower lung zones and has mildly improved. Minimal airspace disease
at the left lower lobe and is more conspicuous in the present study.
Small right pleural effusion greater on the right and has mildly
improved.
IMPRESSION: Mild improvement of the airspace disease at the right mid and lower
lung zones. Bilateral pleural effusion greater on the right and has
improved in the interim as well.
